# Patient Record
Sex: Male | Born: 1970 | Hispanic: Yes | Marital: Married | Smoking: Former smoker
Health system: Southern US, Community
[De-identification: ages and names within clinical notes are randomized; demographics above are authoritative.]

## PROBLEM LIST (undated history)

## (undated) DIAGNOSIS — R011 Cardiac murmur, unspecified: Secondary | ICD-10-CM

## (undated) DIAGNOSIS — I2510 Atherosclerotic heart disease of native coronary artery without angina pectoris: Secondary | ICD-10-CM

## (undated) DIAGNOSIS — I10 Essential (primary) hypertension: Secondary | ICD-10-CM

## (undated) DIAGNOSIS — E785 Hyperlipidemia, unspecified: Secondary | ICD-10-CM

## (undated) DIAGNOSIS — E119 Type 2 diabetes mellitus without complications: Secondary | ICD-10-CM

---

## 2011-10-30 HISTORY — PX: CORONARY ANGIOPLASTY WITH STENT PLACEMENT: SHX49

## 2015-02-11 ENCOUNTER — Encounter (HOSPITAL_COMMUNITY): Payer: Self-pay | Admitting: *Deleted

## 2015-02-11 ENCOUNTER — Emergency Department (HOSPITAL_COMMUNITY): Payer: Medicaid Other

## 2015-02-11 ENCOUNTER — Inpatient Hospital Stay (HOSPITAL_COMMUNITY)
Admission: EM | Admit: 2015-02-11 | Discharge: 2015-02-20 | DRG: 234 | Disposition: A | Discharge status: Home / Self Care | Payer: Medicaid Other | Attending: Thoracic Surgery (Cardiothoracic Vascular Surgery) | Admitting: Thoracic Surgery (Cardiothoracic Vascular Surgery)

## 2015-02-11 DIAGNOSIS — I25111 Atherosclerotic heart disease of native coronary artery with angina pectoris with documented spasm: Secondary | ICD-10-CM

## 2015-02-11 DIAGNOSIS — E785 Hyperlipidemia, unspecified: Secondary | ICD-10-CM | POA: Diagnosis present

## 2015-02-11 DIAGNOSIS — Z8249 Family history of ischemic heart disease and other diseases of the circulatory system: Secondary | ICD-10-CM

## 2015-02-11 DIAGNOSIS — R001 Bradycardia, unspecified: Secondary | ICD-10-CM | POA: Diagnosis present

## 2015-02-11 DIAGNOSIS — I252 Old myocardial infarction: Secondary | ICD-10-CM

## 2015-02-11 DIAGNOSIS — Z7982 Long term (current) use of aspirin: Secondary | ICD-10-CM

## 2015-02-11 DIAGNOSIS — E119 Type 2 diabetes mellitus without complications: Secondary | ICD-10-CM | POA: Diagnosis present

## 2015-02-11 DIAGNOSIS — Z951 Presence of aortocoronary bypass graft: Secondary | ICD-10-CM

## 2015-02-11 DIAGNOSIS — F17210 Nicotine dependence, cigarettes, uncomplicated: Secondary | ICD-10-CM | POA: Diagnosis present

## 2015-02-11 DIAGNOSIS — I2582 Chronic total occlusion of coronary artery: Secondary | ICD-10-CM | POA: Diagnosis present

## 2015-02-11 DIAGNOSIS — D696 Thrombocytopenia, unspecified: Secondary | ICD-10-CM | POA: Diagnosis not present

## 2015-02-11 DIAGNOSIS — I214 Non-ST elevation (NSTEMI) myocardial infarction: Secondary | ICD-10-CM

## 2015-02-11 DIAGNOSIS — Z955 Presence of coronary angioplasty implant and graft: Secondary | ICD-10-CM

## 2015-02-11 DIAGNOSIS — I10 Essential (primary) hypertension: Secondary | ICD-10-CM | POA: Diagnosis present

## 2015-02-11 DIAGNOSIS — Z419 Encounter for procedure for purposes other than remedying health state, unspecified: Secondary | ICD-10-CM

## 2015-02-11 DIAGNOSIS — I340 Nonrheumatic mitral (valve) insufficiency: Secondary | ICD-10-CM | POA: Diagnosis present

## 2015-02-11 DIAGNOSIS — I25110 Atherosclerotic heart disease of native coronary artery with unstable angina pectoris: Secondary | ICD-10-CM | POA: Diagnosis present

## 2015-02-11 DIAGNOSIS — J9811 Atelectasis: Secondary | ICD-10-CM | POA: Diagnosis not present

## 2015-02-11 HISTORY — DX: Hyperlipidemia, unspecified: E78.5

## 2015-02-11 HISTORY — DX: Atherosclerotic heart disease of native coronary artery without angina pectoris: I25.10

## 2015-02-11 HISTORY — DX: Type 2 diabetes mellitus without complications: E11.9

## 2015-02-11 HISTORY — DX: Essential (primary) hypertension: I10

## 2015-02-11 LAB — CBC WITH DIFFERENTIAL/PLATELET
Basophils Absolute: 0 10*3/uL (ref 0.0–0.1)
Basophils Relative: 0 % (ref 0–1)
Eosinophils Absolute: 0.2 10*3/uL (ref 0.0–0.7)
Eosinophils Relative: 2 % (ref 0–5)
HCT: 44.2 % (ref 39.0–52.0)
Hemoglobin: 15.2 g/dL (ref 13.0–17.0)
LYMPHS PCT: 47 % — AB (ref 12–46)
Lymphs Abs: 5 10*3/uL — ABNORMAL HIGH (ref 0.7–4.0)
MCH: 31 pg (ref 26.0–34.0)
MCHC: 34.4 g/dL (ref 30.0–36.0)
MCV: 90 fL (ref 78.0–100.0)
MONO ABS: 0.7 10*3/uL (ref 0.1–1.0)
Monocytes Relative: 7 % (ref 3–12)
NEUTROS ABS: 4.5 10*3/uL (ref 1.7–7.7)
Neutrophils Relative %: 44 % (ref 43–77)
Platelets: 192 10*3/uL (ref 150–400)
RBC: 4.91 MIL/uL (ref 4.22–5.81)
RDW: 13.1 % (ref 11.5–15.5)
WBC: 10.4 10*3/uL (ref 4.0–10.5)

## 2015-02-11 LAB — BASIC METABOLIC PANEL
ANION GAP: 10 (ref 5–15)
BUN: 8 mg/dL (ref 6–23)
CHLORIDE: 104 mmol/L (ref 96–112)
CO2: 22 mmol/L (ref 19–32)
Calcium: 9.6 mg/dL (ref 8.4–10.5)
Creatinine, Ser: 1 mg/dL (ref 0.50–1.35)
GFR calc Af Amer: >90 mL/min (ref >90)
GFR calc non Af Amer: 89 mL/min — ABNORMAL LOW (ref >90)
Glucose, Bld: 305 mg/dL — ABNORMAL HIGH (ref 70–99)
POTASSIUM: 3.7 mmol/L (ref 3.5–5.1)
SODIUM: 136 mmol/L (ref 135–145)

## 2015-02-11 LAB — APTT: aPTT: 29 seconds (ref 24–37)

## 2015-02-11 LAB — TROPONIN I: TROPONIN I: 0.35 ng/mL — AB (ref <0.031)

## 2015-02-11 MED ORDER — HEPARIN BOLUS VIA INFUSION
4000.0000 [IU] | Freq: Once | INTRAVENOUS | Status: AC
  Administered 2015-02-12: 4000 [IU] via INTRAVENOUS
  Filled 2015-02-11: qty 4000

## 2015-02-11 MED ORDER — HEPARIN (PORCINE) IN NACL 100-0.45 UNIT/ML-% IJ SOLN
1100.0000 [IU]/h | INTRAMUSCULAR | Status: DC
  Administered 2015-02-12: 1100 [IU]/h via INTRAVENOUS
  Filled 2015-02-11 (×3): qty 250

## 2015-02-11 MED ORDER — NITROGLYCERIN 0.4 MG SL SUBL
0.4000 mg | SUBLINGUAL_TABLET | SUBLINGUAL | Status: AC | PRN
  Administered 2015-02-11 (×3): 0.4 mg via SUBLINGUAL
  Filled 2015-02-11: qty 1

## 2015-02-11 MED ORDER — ASPIRIN 81 MG PO CHEW
324.0000 mg | CHEWABLE_TABLET | Freq: Once | ORAL | Status: AC
  Administered 2015-02-11: 324 mg via ORAL
  Filled 2015-02-11: qty 4

## 2015-02-11 NOTE — ED Notes (Signed)
Pt went from triage to Xray.

## 2015-02-11 NOTE — ED Notes (Signed)
MD at bedside. Dr. Bebe Shaggy at bedside with use of interpreter

## 2015-02-11 NOTE — ED Notes (Signed)
Pt c/o  Mid-chest pain since last pm no sob no vomiting no cold.  Language barrier

## 2015-02-11 NOTE — ED Provider Notes (Signed)
CSN: [id number]     Arrival date & time 02/11/15  2049 History   First MD Initiated Contact with Patient 02/11/15 2126     Chief Complaint  Patient presents with  . Chest Pain      Patient is a 44 y.o. male presenting with chest pain. The history is provided by the patient. A language interpreter was used ([id number]).  Chest Pain Pain location:  Substernal area Pain quality: pressure   Pain radiates to:  Does not radiate Pain severity:  Moderate Onset quality:  Gradual Duration:  1 day Timing:  Intermittent Progression:  Improving Chronicity:  Recurrent Relieved by:  Aspirin and nitroglycerin Worsened by:  Exertion Associated symptoms: shortness of breath   Associated symptoms: no fever, no lower extremity edema, no syncope and not vomiting   Risk factors: coronary artery disease, male sex and smoking   Pt with h/o CAD He moved here recently 3 yrs ago he was diagnosed with CAD in [place] He reports this episode of CP has been intermittent since last night He reports he has had similar episodes of CP on/off for past 3 yrs He reports usually CP improves with NTG but he is still having pain at this time   PMH - CAD Soc hx - smoker, just moved here last month  History  Substance Use Topics  . Smoking status: Never Smoker   . Smokeless tobacco: Not on file  . Alcohol Use: No    Review of Systems  Constitutional: Negative for fever.  Respiratory: Positive for shortness of breath.   Cardiovascular: Positive for chest pain. Negative for leg swelling and syncope.  Gastrointestinal: Negative for vomiting.  Neurological: Negative for syncope.  All other systems reviewed and are negative.     Allergies  Review of patient's allergies indicates no known allergies.  Home Medications   Prior to Admission medications   Medication Sig Start Date End Date Taking? Authorizing Provider  aspirin 325 MG tablet Take 325 mg by mouth daily.   Yes Historical Provider, MD  isosorbide  dinitrate (ISORDIL) 30 MG tablet Take 30 mg by mouth 4 (four) times daily.   Yes Historical Provider, MD  lisinopril (PRINIVIL,ZESTRIL) 5 MG tablet Take 5 mg by mouth daily.   Yes Historical Provider, MD  metFORMIN (GLUCOPHAGE) 500 MG tablet Take 500 mg by mouth 2 (two) times daily with a meal.   Yes Historical Provider, MD  nitroGLYCERIN (NITROSTAT) 0.4 MG SL tablet Place 0.4 mg under the tongue every 5 (five) minutes as needed for chest pain.   Yes Historical Provider, MD   BP 120/69 mmHg  Pulse 66  Temp(Src) 98.2 F (36.8 C) (Oral)  Resp 18  SpO2 100% Physical Exam CONSTITUTIONAL: Well developed/well nourished HEAD: Normocephalic/atraumatic EYES: EOMI ENMT: Mucous membranes moist NECK: supple no meningeal signs SPINE/BACK:entire spine nontender CV: S1/S2 noted, no murmurs/rubs/gallops noted LUNGS: Lungs are clear to auscultation bilaterally, no apparent distress ABDOMEN: soft, nontender, no rebound or guarding, bowel sounds noted throughout abdomen NEURO: Pt is awake/alert/appropriate, moves all extremitiesx4.  No facial droop.   EXTREMITIES: pulses normal/equal, full ROM SKIN: warm, color normal PSYCH: no abnormalities of mood noted, alert and oriented to situation  ED Course  Procedures  CRITICAL CARE Performed by: Joya Gaskins Total critical care time: 31 Critical care time was exclusive of separately billable procedures and treating other patients. Critical care was necessary to treat or prevent imminent or life-threatening deterioration. Critical care was time spent personally by me on the following activities:  development of treatment plan with patient and/or surrogate as well as nursing, discussions with consultants, evaluation of patient's response to treatment, examination of patient, obtaining history from patient or surrogate, ordering and performing treatments and interventions, ordering and review of laboratory studies, ordering and review of radiographic  studies, pulse oximetry and re-evaluation of patient's condition. PATIENT WITH NON-STEMI.  HE HAS BEEN GIVEN ASA/NTG AND HEPARIN HAS BEEN ORDERED.  CARDIOLOGY HAS BEEN CONSULTED FOR ADMISSION  10:05 PM Pt with h/o CAD He actually has images from cath report in 2013 from [place] with him tonight Will give ASA/NTG at this time 10:29 PM Pt is pain free at this time Troponin elevated D/w cardiology (fellow Isidoro Donning) will evaluate patient 11:50 PM Pt reports return of chest pain Advised nursing to give another NTG Heparin has been ordered BP 157/85 mmHg  Pulse 84  Temp(Src) 98.2 F (36.8 C) (Oral)  Resp 16  SpO2 97%  Labs Review Labs Reviewed  BASIC METABOLIC PANEL - Abnormal; Notable for the following:    Glucose, Bld 305 (*)    GFR calc non Af Amer 89 (*)    All other components within normal limits  CBC WITH DIFFERENTIAL/PLATELET - Abnormal; Notable for the following:    Lymphocytes Relative 47 (*)    Lymphs Abs 5.0 (*)    All other components within normal limits  TROPONIN I - Abnormal; Notable for the following:    Troponin I 0.35 (*)    All other components within normal limits  APTT  HEPARIN LEVEL (UNFRACTIONATED)  CBC    Imaging Review Dg Chest 2 View  02/11/2015   CLINICAL DATA:  Bilateral chest pain and shortness of breath, 2 days duration.  EXAM: CHEST  2 VIEW  COMPARISON:  None.  FINDINGS: Heart size is normal. Mediastinal shadows are normal. Lungs are clear. No effusions. No bony abnormality.  IMPRESSION: Normal chest   Electronically Signed   By: Paulina Fusi M.D.   On: 02/11/2015 21:59    ED ECG REPORT  Date: 02/11/2015 2057  Rate: 75  Rhythm: normal sinus rhythm  QRS Axis: normal  Intervals: normal  ST/T Wave abnormalities: ST depressions inferiorly  Conduction Disutrbances:none  Narrative Interpretation: evidence of previous anterior infarct noted  Old EKG Reviewed: none available     EKG Interpretation   Date/Time:  Friday February 11 2015 21:51:34  EDT Ventricular Rate:  70 PR Interval:  131 QRS Duration: 110 QT Interval:  391 QTC Calculation: 422 R Axis:   67 Text Interpretation:  Sinus rhythm Anterolateral infarct, age  indeterminate No significant change since last tracing Confirmed by  Bebe Shaggy  MD, Rolla Kedzierski ([id number]) on 02/11/2015 10:02:06 PM     Medications  heparin bolus via infusion 4,000 Units (not administered)  heparin ADULT infusion 100 units/mL (25000 units/250 mL) (not administered)  aspirin chewable tablet 324 mg (324 mg Oral Given 02/11/15 2200)  nitroGLYCERIN (NITROSTAT) SL tablet 0.4 mg (0.4 mg Sublingual Given 02/11/15 2349)    MDM   Final diagnoses:  Non-STEMI (non-ST elevated myocardial infarction)    Nursing notes including past medical history and social history reviewed and considered in documentation xrays/imaging reviewed by myself and considered during evaluation Labs/vital reviewed myself and considered during evaluation     Zadie Rhine, MD 02/11/15 2351

## 2015-02-11 NOTE — Progress Notes (Signed)
ANTICOAGULATION CONSULT NOTE - Initial Consult  Pharmacy Consult for Heparin  Indication: chest pain/ACS  No Known Allergies  Vital Signs: Temp: 98.2 F (36.8 C) (04/15 2201) Temp Source: Oral (04/15 2201) BP: 157/85 mmHg (04/15 2317) Pulse Rate: 84 (04/15 2317)  Labs:  Recent Labs  02/11/15 2106  HGB 15.2  HCT 44.2  PLT 192  CREATININE 1.00  TROPONINI 0.35*    Assessment: Heparin for CP, mildly elevated troponin, CBC good, renal function good, other labs as above.   Goal of Therapy:  Heparin level 0.3-0.7 units/ml Monitor platelets by anticoagulation protocol: Yes   Plan:  -Heparin 4000 units BOLUS -Start heparin drip at 1100 units/hr -0800 HL -Daily CBC/HL -Monitor for bleeding  Abran Duke 02/11/2015,11:36 PM

## 2015-02-12 ENCOUNTER — Encounter (HOSPITAL_COMMUNITY): Payer: Self-pay | Admitting: Cardiology

## 2015-02-12 DIAGNOSIS — E785 Hyperlipidemia, unspecified: Secondary | ICD-10-CM | POA: Diagnosis present

## 2015-02-12 DIAGNOSIS — D696 Thrombocytopenia, unspecified: Secondary | ICD-10-CM | POA: Diagnosis not present

## 2015-02-12 DIAGNOSIS — J9811 Atelectasis: Secondary | ICD-10-CM | POA: Diagnosis not present

## 2015-02-12 DIAGNOSIS — I214 Non-ST elevation (NSTEMI) myocardial infarction: Principal | ICD-10-CM

## 2015-02-12 DIAGNOSIS — I10 Essential (primary) hypertension: Secondary | ICD-10-CM | POA: Diagnosis present

## 2015-02-12 DIAGNOSIS — Z8249 Family history of ischemic heart disease and other diseases of the circulatory system: Secondary | ICD-10-CM | POA: Diagnosis not present

## 2015-02-12 DIAGNOSIS — Z955 Presence of coronary angioplasty implant and graft: Secondary | ICD-10-CM | POA: Diagnosis not present

## 2015-02-12 DIAGNOSIS — F17210 Nicotine dependence, cigarettes, uncomplicated: Secondary | ICD-10-CM | POA: Diagnosis present

## 2015-02-12 DIAGNOSIS — R001 Bradycardia, unspecified: Secondary | ICD-10-CM | POA: Diagnosis present

## 2015-02-12 DIAGNOSIS — I25110 Atherosclerotic heart disease of native coronary artery with unstable angina pectoris: Secondary | ICD-10-CM | POA: Diagnosis present

## 2015-02-12 DIAGNOSIS — E1159 Type 2 diabetes mellitus with other circulatory complications: Secondary | ICD-10-CM

## 2015-02-12 DIAGNOSIS — E119 Type 2 diabetes mellitus without complications: Secondary | ICD-10-CM | POA: Diagnosis present

## 2015-02-12 DIAGNOSIS — I340 Nonrheumatic mitral (valve) insufficiency: Secondary | ICD-10-CM | POA: Diagnosis present

## 2015-02-12 DIAGNOSIS — R0789 Other chest pain: Secondary | ICD-10-CM | POA: Diagnosis present

## 2015-02-12 DIAGNOSIS — Z7982 Long term (current) use of aspirin: Secondary | ICD-10-CM | POA: Diagnosis not present

## 2015-02-12 DIAGNOSIS — I2582 Chronic total occlusion of coronary artery: Secondary | ICD-10-CM | POA: Diagnosis present

## 2015-02-12 DIAGNOSIS — I252 Old myocardial infarction: Secondary | ICD-10-CM | POA: Diagnosis not present

## 2015-02-12 LAB — BASIC METABOLIC PANEL
Anion gap: 8 (ref 5–15)
BUN: 7 mg/dL (ref 6–23)
CALCIUM: 9.1 mg/dL (ref 8.4–10.5)
CHLORIDE: 105 mmol/L (ref 96–112)
CO2: 25 mmol/L (ref 19–32)
Creatinine, Ser: 1.02 mg/dL (ref 0.50–1.35)
GFR calc non Af Amer: 87 mL/min — ABNORMAL LOW (ref >90)
GLUCOSE: 220 mg/dL — AB (ref 70–99)
Potassium: 4.1 mmol/L (ref 3.5–5.1)
Sodium: 138 mmol/L (ref 135–145)

## 2015-02-12 LAB — CBC
HEMATOCRIT: 42.7 % (ref 39.0–52.0)
Hemoglobin: 14.8 g/dL (ref 13.0–17.0)
MCH: 31.2 pg (ref 26.0–34.0)
MCHC: 34.7 g/dL (ref 30.0–36.0)
MCV: 90.1 fL (ref 78.0–100.0)
PLATELETS: 186 10*3/uL (ref 150–400)
RBC: 4.74 MIL/uL (ref 4.22–5.81)
RDW: 13.2 % (ref 11.5–15.5)
WBC: 9.7 10*3/uL (ref 4.0–10.5)

## 2015-02-12 LAB — PROTIME-INR
INR: 1.07 (ref 0.00–1.49)
Prothrombin Time: 14 seconds (ref 11.6–15.2)

## 2015-02-12 LAB — LIPID PANEL
CHOL/HDL RATIO: 6.1 ratio
Cholesterol: 158 mg/dL (ref 0–200)
HDL: 26 mg/dL — ABNORMAL LOW (ref >39)
LDL CALC: 98 mg/dL (ref 0–99)
TRIGLYCERIDES: 172 mg/dL — AB (ref <150)
VLDL: 34 mg/dL (ref 0–40)

## 2015-02-12 LAB — GLUCOSE, CAPILLARY
GLUCOSE-CAPILLARY: 240 mg/dL — AB (ref 70–99)
GLUCOSE-CAPILLARY: 194 mg/dL — AB (ref 70–99)
Glucose-Capillary: 243 mg/dL — ABNORMAL HIGH (ref 70–99)
Glucose-Capillary: 220 mg/dL — ABNORMAL HIGH (ref 70–99)

## 2015-02-12 LAB — TROPONIN I
TROPONIN I: 0.6 ng/mL — AB (ref <0.031)
Troponin I: 0.49 ng/mL — ABNORMAL HIGH (ref <0.031)
Troponin I: 0.65 ng/mL (ref <0.031)

## 2015-02-12 LAB — HEPARIN LEVEL (UNFRACTIONATED)
Heparin Unfractionated: 0.43 IU/mL (ref 0.30–0.70)
Heparin Unfractionated: 0.35 IU/mL (ref 0.30–0.70)

## 2015-02-12 LAB — TSH: TSH: 1.956 u[IU]/mL (ref 0.350–4.500)

## 2015-02-12 MED ORDER — ASPIRIN EC 81 MG PO TBEC
81.0000 mg | DELAYED_RELEASE_TABLET | Freq: Every day | ORAL | Status: DC
  Administered 2015-02-12 – 2015-02-15 (×3): 81 mg via ORAL
  Filled 2015-02-12 (×5): qty 1

## 2015-02-12 MED ORDER — NITROGLYCERIN 0.4 MG SL SUBL
0.4000 mg | SUBLINGUAL_TABLET | SUBLINGUAL | Status: DC | PRN
  Administered 2015-02-12 – 2015-02-13 (×4): 0.4 mg via SUBLINGUAL
  Filled 2015-02-12 (×2): qty 1

## 2015-02-12 MED ORDER — ZOLPIDEM TARTRATE 5 MG PO TABS: 5.0000 mg | ORAL_TABLET | Freq: Every evening | ORAL | Status: DC | PRN

## 2015-02-12 MED ORDER — SODIUM CHLORIDE 0.9 % IV SOLN
INTRAVENOUS | Status: DC
Start: 2015-02-14 — End: 2015-02-14
  Administered 2015-02-12: 75 mL/h via INTRAVENOUS
  Administered 2015-02-14 (×2): via INTRAVENOUS

## 2015-02-12 MED ORDER — ISOSORBIDE MONONITRATE ER 60 MG PO TB24
60.0000 mg | ORAL_TABLET | Freq: Every day | ORAL | Status: DC
  Administered 2015-02-12 – 2015-02-14 (×3): 60 mg via ORAL
  Filled 2015-02-12 (×5): qty 1

## 2015-02-12 MED ORDER — INSULIN DETEMIR 100 UNIT/ML ~~LOC~~ SOLN
10.0000 [IU] | Freq: Every day | SUBCUTANEOUS | Status: DC
  Administered 2015-02-12 – 2015-02-14 (×4): 10 [IU] via SUBCUTANEOUS
  Filled 2015-02-12 (×6): qty 0.1

## 2015-02-12 MED ORDER — ONDANSETRON HCL 4 MG/2ML IJ SOLN: 4.0000 mg | Freq: Four times a day (QID) | INTRAMUSCULAR | Status: DC | PRN

## 2015-02-12 MED ORDER — LISINOPRIL 5 MG PO TABS
5.0000 mg | ORAL_TABLET | Freq: Every day | ORAL | Status: DC
  Administered 2015-02-12 – 2015-02-14 (×3): 5 mg via ORAL
  Filled 2015-02-12 (×3): qty 1

## 2015-02-12 MED ORDER — INSULIN ASPART 100 UNIT/ML ~~LOC~~ SOLN
0.0000 [IU] | Freq: Three times a day (TID) | SUBCUTANEOUS | Status: DC
  Administered 2015-02-12 – 2015-02-13 (×4): 5 [IU] via SUBCUTANEOUS
  Administered 2015-02-13: 15 [IU] via SUBCUTANEOUS
  Administered 2015-02-14: 5 [IU] via SUBCUTANEOUS
  Administered 2015-02-14: 3 [IU] via SUBCUTANEOUS
  Administered 2015-02-15: 2 [IU] via SUBCUTANEOUS
  Administered 2015-02-15 – 2015-02-16 (×3): 3 [IU] via SUBCUTANEOUS

## 2015-02-12 MED ORDER — INSULIN ASPART 100 UNIT/ML ~~LOC~~ SOLN
0.0000 [IU] | Freq: Every day | SUBCUTANEOUS | Status: DC
  Administered 2015-02-12: 2 [IU] via SUBCUTANEOUS
  Administered 2015-02-13: 3 [IU] via SUBCUTANEOUS

## 2015-02-12 MED ORDER — METOPROLOL TARTRATE 25 MG PO TABS
25.0000 mg | ORAL_TABLET | Freq: Two times a day (BID) | ORAL | Status: DC
  Administered 2015-02-12 (×3): 25 mg via ORAL
  Filled 2015-02-12 (×5): qty 1

## 2015-02-12 MED ORDER — ATORVASTATIN CALCIUM 80 MG PO TABS
80.0000 mg | ORAL_TABLET | Freq: Every day | ORAL | Status: DC
  Administered 2015-02-13 – 2015-02-19 (×6): 80 mg via ORAL
  Filled 2015-02-12 (×10): qty 1

## 2015-02-12 MED ORDER — ACETAMINOPHEN 325 MG PO TABS
650.0000 mg | ORAL_TABLET | ORAL | Status: DC | PRN
  Administered 2015-02-12: 650 mg via ORAL
  Filled 2015-02-12 (×2): qty 2

## 2015-02-12 MED ORDER — ASPIRIN 81 MG PO CHEW: 324.0000 mg | CHEWABLE_TABLET | ORAL | Status: DC

## 2015-02-12 MED ORDER — NITROGLYCERIN 0.4 MG SL SUBL
SUBLINGUAL_TABLET | SUBLINGUAL | Status: AC
  Administered 2015-02-12: 0.4 mg via SUBLINGUAL
  Filled 2015-02-12: qty 1

## 2015-02-12 MED ORDER — ASPIRIN 300 MG RE SUPP: 300.0000 mg | RECTAL | Status: DC

## 2015-02-12 NOTE — H&P (Signed)
PCP:   No primary care provider on file.   Chief Complaint:  Chest pain  HPI: This is 44 year old Hispanic male with past medical history of hypertension, hyperlipidemia, diabetes mellitus type 2, coronary artery disease status post myocardial infarction, previous heart catheterization and at least one stent placed in the LAD for what looked like LAD occlusion. History is limited secondary to language barrier however patient brought images from left heart catheterization RAO cranial views where it appears that he had LAD occluded and then stented Apparently patient his wife are visiting from [place]. Patient presented to the ED with pressure-like chest pain for about 24 hours with minimal exertion like walking across the hallway, pain resolves with nitroglycerin and doesn't go away if he does not take nitroglycerin. Chest pain is associated with shortness of breath. Patient initial troponin was positive in the ED therefore admission to cardiology was requested  Patient denied active symptoms of chest pain, shortness of breath, PND orthopnea, lower extremity edema, frequent or prolonged palpitations, lower extremity edema, nausea vomiting - at the moment of evaluation  Review of Systems:  12 systems were reviewed and were negative except mentioned in history of present illness  Past Medical History: Hypertension, hyperlipidemia, diabetes mellitus type 2, coronary artery disease status post myocardial infarction in 2012 and 2014, LAD stent  Medications: Prior to Admission medications   Medication Sig Start Date End Date Taking? Authorizing Provider  aspirin 325 MG tablet Take 325 mg by mouth daily.   Yes Historical Provider, MD  isosorbide dinitrate (ISORDIL) 30 MG tablet Take 30 mg by mouth 4 (four) times daily.   Yes Historical Provider, MD  lisinopril (PRINIVIL,ZESTRIL) 5 MG tablet Take 5 mg by mouth daily.   Yes Historical Provider, MD  metFORMIN (GLUCOPHAGE) 500 MG tablet Take 500 mg by  mouth 2 (two) times daily with a meal.   Yes Historical Provider, MD  nitroGLYCERIN (NITROSTAT) 0.4 MG SL tablet Place 0.4 mg under the tongue every 5 (five) minutes as needed for chest pain.   Yes Historical Provider, MD    Allergies:  No Known Allergies  Social History: 1 pack a day smoker for about 30 years, no drugs, no alcohol  Family History: Father - CABG at the age of 63  PHYSICAL EXAM:  Filed Vitals:   02/11/15 2317 02/11/15 2330 02/12/15 0000 02/12/15 0015  BP: 157/85 146/80 149/74 153/78  Pulse: 84 62 62 67  Temp:      TempSrc:      Resp: SpO2: 97% 97% 91% 94%   General:  Well appearing. No respiratory difficulty HEENT: normal Neck: supple. no JVD. Carotids 2+ bilat; no bruits. No lymphadenopathy or thryomegaly appreciated. Cor: PMI nondisplaced. Regular rate & rhythm. No rubs, gallops or murmurs. Lungs: clear Abdomen: soft, nontender, nondistended. No hepatosplenomegaly. No bruits or masses. Good bowel sounds. Extremities: no cyanosis, clubbing, rash, edema Neuro: alert & oriented x 3, cranial nerves grossly intact. moves all 4 extremities w/o difficulty. Affect pleasant.  Labs on Admission:   Recent Labs  02/11/15 2106  NA 136  K 3.7  CL 104  CO2 22  GLUCOSE 305*  BUN 8  CREATININE 1.00  CALCIUM 9.6   No results for input(s): AST, ALT, ALKPHOS, BILITOT, PROT, ALBUMIN in the last 72 hours. No results for input(s): LIPASE, AMYLASE in the last 72 hours.  Recent Labs  02/11/15 2106  WBC 10.4  NEUTROABS 4.5  HGB 15.2  HCT 44.2  MCV  90.0  PLT 192    Recent Labs  02/11/15 2106  TROPONINI 0.35*   No results for input(s): TSH, T4TOTAL, T3FREE, THYROIDAB in the last 72 hours.  Invalid input(s): FREET3 No results for input(s): VITAMINB12, FOLATE, FERRITIN, TIBC, IRON, RETICCTPCT in the last 72 hours.  Radiological Exams on Admission (all images were personally reviewed and interpreted by me, radiology reports were reviewed as  well): Dg Chest 2 View  02/11/2015   CLINICAL DATA:  Bilateral chest pain and shortness of breath, 2 days duration.  EXAM: CHEST  2 VIEW  COMPARISON:  None.  FINDINGS: Heart size is normal. Mediastinal shadows are normal. Lungs are clear. No effusions. No bony abnormality.  IMPRESSION: Normal chest   Electronically Signed   By: Paulina Fusi M.D.   On: 02/11/2015 21:59   EKG personally reviewed and interpreted by me: Normal sinus rhythm 75 bpm, normal axis to assess complex in V2 V3, IVCD  Assessment/Plan Present on Admission:  . NSTEMI (non-ST elevated myocardial infarction) Diabetes mellitus type 2 likely uncontrolled HLD HTN Tobacco abuse   Plan: Admit to telemetry Lipitor 80 mg daily Heparin drip per pharmacy for ACS protocol Aspirin 81 mg daily. Dose aspirin was given in the emergency department Smoking cessation counseling per nursing Imdur 60 mg daily first dose now Metoprolol 25 mg twice a day first dose now Continue lisinopril 5 mg daily Troponin every 6 hours 3 EKG as needed for chest pain Check TSH, check hemoglobin A1c, lipid profile AM labs He she will likely need repeat heart catheterization, high possibility of multivessel disease   Isaac Ray 02/12/2015, 12:48 AM

## 2015-02-12 NOTE — Progress Notes (Signed)
Utilization Review Completed.Isaac Ray T4/16/2016  

## 2015-02-12 NOTE — ED Notes (Signed)
Attempted report 

## 2015-02-12 NOTE — Progress Notes (Signed)
Spoke with Verlon Au, NP.  She was given troponin results of 0.35 and 0.60.  No orders at this time.  She will see patient later today.

## 2015-02-12 NOTE — ED Notes (Signed)
Cardiology at bedside.

## 2015-02-12 NOTE — Progress Notes (Addendum)
    Admitting cardiologist: Dr. Dina Rich (patient lives in [place])  Seen for followup: Unstable angina  Subjective:    Slept well, no recurrent chest pain or shortness of breath.  Objective:   Temp:  [97.9 F (36.6 C)-98.2 F (36.8 C)] 98 F (36.7 C) (04/16 0615) Pulse Rate:  [57-84] 57 (04/16 0615) Resp:  [14-21] 18 (04/16 0615) BP: (116-161)/(69-91) 116/70 mmHg (04/16 0615) SpO2:  [90 %-100 %] 97 % (04/16 0615) Weight:  [159 lb 4.8 oz (72.258 kg)] 159 lb 4.8 oz (72.258 kg) (04/16 0149) Last BM Date: 02/10/15  Filed Weights   02/12/15 0149  Weight: 159 lb 4.8 oz (72.258 kg)    Telemetry: Sinus rhythm and sinus bradycardia.  Exam:  General: Appears comfortable at rest.  Lungs: Clear, nonlabored.  Cardiac: RRR, no gallop or rub.  Abdomen: NABS.  Extremities: No pitting edema.  Lab Results:  Basic Metabolic Panel:  Recent Labs Lab 02/11/15 2106 02/12/15 0347  NA 136 138  K 3.7 4.1  CL 104 105  CO2 22 25  GLUCOSE 305* 220*  BUN 8 7  CREATININE 1.00 1.02  CALCIUM 9.6 9.1    CBC:  Recent Labs Lab 02/11/15 2106 02/12/15 0347  WBC 10.4 9.7  HGB 15.2 14.8  HCT 44.2 42.7  MCV 90.0 90.1  PLT 192 186    Cardiac Enzymes:  Recent Labs Lab 02/11/15 2106 02/12/15 0347 02/12/15 0753  TROPONINI 0.35* 0.49* 0.60*    Coagulation:  Recent Labs Lab 02/12/15 0347  INR 1.07    ECG: Sinus rhythm with IVCD and possible old anterolateral infarct pattern with repolarization abnormalities.   Medications:   Scheduled Medications: . aspirin EC  81 mg Oral Daily  . atorvastatin  80 mg Oral q1800  . insulin aspart  0-15 Units Subcutaneous TID WC  . insulin aspart  0-5 Units Subcutaneous QHS  . insulin detemir  10 Units Subcutaneous QHS  . isosorbide mononitrate  60 mg Oral Daily  . lisinopril  5 mg Oral Daily  . metoprolol tartrate  25 mg Oral BID     Infusions: . heparin 1,100 Units/hr (02/12/15 0017)     PRN  Medications:  acetaminophen, nitroGLYCERIN, ondansetron (ZOFRAN) IV, zolpidem   Assessment:   1. Presentation with unstable angina symptoms, only mild increase in troponin I above clinical scenario consistent with NSTEMI.  2. History of CAD status post mid LAD stent placement at [facility] in June 2013. Details of stent type not available. Review of limited records finds that the patient was treated with aspirin and Effient at the time.  3. Type 2 diabetes mellitus, on insulin sliding scale. On Glucophage as an outpatient.  4. History of hyperlipidemia, currently on high-dose Lipitor. LDL 98.  5. Essential hypertension.  Plan/Discussion:    Reviewed history and physical, patient here visiting from [place]. He is clinically stable at this time. Plan is to continue heparin with plan to pursue diagnostic cardiac catheterization on Monday. Continue remaining cardiac medications, watch heart rate with bradycardia. Check hemoglobin A1c to get a better sense for outpatient control of glucose. He will need Spanish interpreter for the procedure.   Jonelle Sidle, M.D., F.A.C.C.

## 2015-02-12 NOTE — Progress Notes (Addendum)
ANTICOAGULATION CONSULT NOTE - Initial Consult  Pharmacy Consult for Heparin  Indication: chest pain/ACS  No Known Allergies  Vital Signs: Temp: 98 F (36.7 C) (04/16 0615) Temp Source: Oral (04/16 0615) BP: 116/70 mmHg (04/16 0615) Pulse Rate: 57 (04/16 0615)  Labs:  Recent Labs  02/11/15 2106 02/12/15 0347 02/12/15 0753  HGB 15.2 14.8  --   HCT 44.2 42.7  --   PLT 192 186  --   APTT 29  --   --   LABPROT  --  14.0  --   INR  --  1.07  --   HEPARINUNFRC  --   --  0.43  CREATININE 1.00 1.02  --   TROPONINI 0.35* 0.49* 0.60*    Assessment: Heparin for CP, mildly elevated troponin, CBC good, renal function good, other labs as above. Heparin level came back therapeutic. Going to repeat level to confirm.   Goal of Therapy:  Heparin level 0.3-0.7 units/ml Monitor platelets by anticoagulation protocol: Yes   Plan:   -Cont heparin at 1100 units/hr -Repeat level to confirm -Daily CBC/HL -Monitor for bleeding  Ulyses Southward, PharmD Pager: [phone] 02/12/2015 1:36 PM  Addendum:  Hanley Hays HL is therapeutic  Cont same rate  Ulyses Southward, PharmD Pager: [phone] 02/12/2015 3:10 PM

## 2015-02-12 NOTE — Progress Notes (Signed)
Received call from Lab Pt Troponin Level 0.60 (increased from previous level of 0.49), MD on call for cardiology paged. Will continue to monitor patient. Tildon Silveria, Randall An RN

## 2015-02-13 LAB — GLUCOSE, CAPILLARY
GLUCOSE-CAPILLARY: 258 mg/dL — AB (ref 70–99)
Glucose-Capillary: 236 mg/dL — ABNORMAL HIGH (ref 70–99)
Glucose-Capillary: 207 mg/dL — ABNORMAL HIGH (ref 70–99)
Glucose-Capillary: 337 mg/dL — ABNORMAL HIGH (ref 70–99)
Glucose-Capillary: 148 mg/dL — ABNORMAL HIGH (ref 70–99)

## 2015-02-13 LAB — CBC
HCT: 43.1 % (ref 39.0–52.0)
Hemoglobin: 14.6 g/dL (ref 13.0–17.0)
MCH: 30.7 pg (ref 26.0–34.0)
MCHC: 33.9 g/dL (ref 30.0–36.0)
MCV: 90.5 fL (ref 78.0–100.0)
Platelets: 175 10*3/uL (ref 150–400)
RBC: 4.76 MIL/uL (ref 4.22–5.81)
RDW: 13.1 % (ref 11.5–15.5)
WBC: 8.7 10*3/uL (ref 4.0–10.5)

## 2015-02-13 LAB — HEPARIN LEVEL (UNFRACTIONATED)
HEPARIN UNFRACTIONATED: 0.14 [IU]/mL — AB (ref 0.30–0.70)
HEPARIN UNFRACTIONATED: 0.38 [IU]/mL (ref 0.30–0.70)

## 2015-02-13 MED ORDER — METOPROLOL TARTRATE 12.5 MG HALF TABLET
12.5000 mg | ORAL_TABLET | Freq: Two times a day (BID) | ORAL | Status: DC
  Administered 2015-02-13 (×2): 12.5 mg via ORAL
  Filled 2015-02-13 (×4): qty 1

## 2015-02-13 MED ORDER — SODIUM CHLORIDE 0.9 % IV SOLN: 250.0000 mL | INTRAVENOUS | Status: DC | PRN

## 2015-02-13 MED ORDER — SODIUM CHLORIDE 0.9 % IJ SOLN
3.0000 mL | Freq: Two times a day (BID) | INTRAMUSCULAR | Status: DC
  Administered 2015-02-13 – 2015-02-14 (×2): 3 mL via INTRAVENOUS

## 2015-02-13 MED ORDER — HEPARIN (PORCINE) IN NACL 100-0.45 UNIT/ML-% IJ SOLN
1300.0000 [IU]/h | INTRAMUSCULAR | Status: DC
  Filled 2015-02-13 (×2): qty 250

## 2015-02-13 MED ORDER — SODIUM CHLORIDE 0.9 % IJ SOLN: 3.0000 mL | INTRAMUSCULAR | Status: DC | PRN

## 2015-02-13 NOTE — Progress Notes (Signed)
ANTICOAGULATION CONSULT NOTE - Follow-up  Pharmacy Consult for Heparin  Indication: chest pain/ACS  No Known Allergies  Vital Signs: Temp: 98 F (36.7 C) (04/17 1420) Temp Source: Oral (04/17 1420) BP: 118/52 mmHg (04/17 1420) Pulse Rate: 52 (04/17 1420)  Labs:  Recent Labs  02/11/15 2106 02/12/15 0347  02/12/15 0753 02/12/15 1359 02/13/15 0520 02/13/15 1016 02/13/15 1848  HGB 15.2 14.8  --   --   --  14.6  --   --   HCT 44.2 42.7  --   --   --  43.1  --   --   PLT 192 186  --   --   --  175  --   --   APTT 29  --   --   --   --   --   --   --   LABPROT  --  14.0  --   --   --   --   --   --   INR  --  1.07  --   --   --   --   --   --   HEPARINUNFRC  --   --   < > 0.43 0.35  --  0.14* 0.38  CREATININE 1.00 1.02  --   --   --   --   --   --   TROPONINI 0.35* 0.49*  --  0.60* 0.65*  --   --   --   < > = values in this interval not displayed.  Assessment: 43 yom continues on IV heparin for CP/ACS. Heparin level is now therapeutic. No bleeding noted.   Goal of Therapy:  Heparin level 0.3-0.7 units/ml Monitor platelets by anticoagulation protocol: Yes   Plan:  - Continue heparin gtt 1300 units/hr - F/u AM heparin level to confirm dosing  Lysle Pearl, PharmD, BCPS Pager # [phone] 02/13/2015 7:27 PM

## 2015-02-13 NOTE — Progress Notes (Signed)
    Admitting cardiologist: Dr. Dina Rich (patient lives in [place])  Seen for followup: Unstable angina  Subjective:    No recurrent chest pain or shortness of breath.  Objective:   Temp:  [97.6 F (36.4 C)-98.9 F (37.2 C)] 97.6 F (36.4 C) (04/17 0509) Pulse Rate:  [44-60] 44 (04/17 0509) Resp:  [18] 18 (04/17 0509) BP: (126-144)/(65-93) 126/73 mmHg (04/17 0509) SpO2:  [94 %-99 %] 99 % (04/17 0509) Last BM Date: 02/10/15  Filed Weights   02/12/15 0149  Weight: 159 lb 4.8 oz (72.258 kg)    Telemetry: Sinus rhythm and sinus bradycardia.  Exam:  General: Appears comfortable at rest.  Lungs: Clear, nonlabored.  Cardiac: RRR, no gallop or rub.  Abdomen: NABS.  Extremities: No pitting edema.  Lab Results:  Basic Metabolic Panel:  Recent Labs Lab 02/11/15 2106 02/12/15 0347  NA 136 138  K 3.7 4.1  CL 104 105  CO2 22 25  GLUCOSE 305* 220*  BUN 8 7  CREATININE 1.00 1.02  CALCIUM 9.6 9.1    CBC:  Recent Labs Lab 02/11/15 2106 02/12/15 0347 02/13/15 0520  WBC 10.4 9.7 8.7  HGB 15.2 14.8 14.6  HCT 44.2 42.7 43.1  MCV 90.0 90.1 90.5  PLT 192 186 175    Cardiac Enzymes:  Recent Labs Lab 02/12/15 0347 02/12/15 0753 02/12/15 1359  TROPONINI 0.49* 0.60* 0.65*    Coagulation:  Recent Labs Lab 02/12/15 0347  INR 1.07    ECG: Sinus bradycardia with IVCD and old anterolateral infarct pattern with repolarization abnormalities.   Medications:   Scheduled Medications: . aspirin EC  81 mg Oral Daily  . atorvastatin  80 mg Oral q1800  . insulin aspart  0-15 Units Subcutaneous TID WC  . insulin aspart  0-5 Units Subcutaneous QHS  . insulin detemir  10 Units Subcutaneous QHS  . isosorbide mononitrate  60 mg Oral Daily  . lisinopril  5 mg Oral Daily  . metoprolol tartrate  25 mg Oral BID    Infusions: . [START ON 02/14/2015] sodium chloride 75 mL/hr (02/12/15 1536)  . heparin 1,100 Units/hr (02/12/15 0017)    PRN  Medications: acetaminophen, nitroGLYCERIN, ondansetron (ZOFRAN) IV, zolpidem   Assessment:   1. Presentation with unstable angina symptoms, only mild increase in troponin I, although clinical scenario consistent with NSTEMI. No recurrent chest pain.  2. History of CAD status post mid LAD stent placement at [facility] in June 2013. Details of stent type not available. Review of limited records finds that the patient was treated with aspirin and Effient at the time.  3. Type 2 diabetes mellitus, on insulin sliding scale. On Glucophage as an outpatient.  4. History of hyperlipidemia, currently on high-dose Lipitor. LDL 98.  5. Essential hypertension.  Plan/Discussion:    Patient here visiting from [place]. He is clinically stable at this time. Plan is to continue heparin with plan to pursue diagnostic cardiac catheterization on Monday - orders written. He will need Spanish interpreter for the procedure. Decrease beta blocker dose with bradycardia.    Jonelle Sidle, M.D., F.A.C.C.

## 2015-02-13 NOTE — Progress Notes (Signed)
ANTICOAGULATION CONSULT NOTE - Initial Consult  Pharmacy Consult for Heparin  Indication: chest pain/ACS  No Known Allergies  Vital Signs: Temp: 97.6 F (36.4 C) (04/17 0509) Temp Source: Oral (04/17 0509) BP: 126/73 mmHg (04/17 0509) Pulse Rate: 44 (04/17 0509)  Labs:  Recent Labs  02/11/15 2106 02/12/15 0347 02/12/15 0753 02/12/15 1359 02/13/15 0520 02/13/15 1016  HGB 15.2 14.8  --   --  14.6  --   HCT 44.2 42.7  --   --  43.1  --   PLT 192 186  --   --  175  --   APTT 29  --   --   --   --   --   LABPROT  --  14.0  --   --   --   --   INR  --  1.07  --   --   --   --   HEPARINUNFRC  --   --  0.43 0.35  --  0.14*  CREATININE 1.00 1.02  --   --   --   --   TROPONINI 0.35* 0.49* 0.60* 0.65*  --   --     Assessment: Heparin for CP, mildly elevated troponin, CBC good, renal function good, other labs as above. Heparin level subtherapeutic this AM. Going to adjust rate. Cath in AM  Goal of Therapy:  Heparin level 0.3-0.7 units/ml Monitor platelets by anticoagulation protocol: Yes   Plan:   Increase heparin to 1300 units/hr Check 6 hr heparin level  Ulyses Southward, PharmD Pager: [phone] 02/13/2015 12:07 PM

## 2015-02-14 ENCOUNTER — Encounter (HOSPITAL_COMMUNITY)
Admission: EM | Disposition: A | Discharge status: Home / Self Care | Payer: Self-pay | Source: Home / Self Care | Attending: Cardiology

## 2015-02-14 ENCOUNTER — Other Ambulatory Visit: Payer: Self-pay

## 2015-02-14 ENCOUNTER — Encounter (HOSPITAL_COMMUNITY): Payer: Self-pay | Admitting: Thoracic Surgery (Cardiothoracic Vascular Surgery)

## 2015-02-14 DIAGNOSIS — I25111 Atherosclerotic heart disease of native coronary artery with angina pectoris with documented spasm: Secondary | ICD-10-CM

## 2015-02-14 DIAGNOSIS — I25110 Atherosclerotic heart disease of native coronary artery with unstable angina pectoris: Secondary | ICD-10-CM

## 2015-02-14 HISTORY — PX: LEFT HEART CATHETERIZATION WITH CORONARY ANGIOGRAM: SHX5451

## 2015-02-14 LAB — GLUCOSE, CAPILLARY
GLUCOSE-CAPILLARY: 144 mg/dL — AB (ref 70–99)
Glucose-Capillary: 184 mg/dL — ABNORMAL HIGH (ref 70–99)
Glucose-Capillary: 131 mg/dL — ABNORMAL HIGH (ref 70–99)
Glucose-Capillary: 223 mg/dL — ABNORMAL HIGH (ref 70–99)

## 2015-02-14 LAB — BASIC METABOLIC PANEL
Anion gap: 8 (ref 5–15)
BUN: 9 mg/dL (ref 6–23)
CO2: 25 mmol/L (ref 19–32)
CREATININE: 1.06 mg/dL (ref 0.50–1.35)
Calcium: 8.7 mg/dL (ref 8.4–10.5)
Chloride: 106 mmol/L (ref 96–112)
GFR calc non Af Amer: 83 mL/min — ABNORMAL LOW (ref >90)
Glucose, Bld: 192 mg/dL — ABNORMAL HIGH (ref 70–99)
Potassium: 3.9 mmol/L (ref 3.5–5.1)
Sodium: 139 mmol/L (ref 135–145)

## 2015-02-14 LAB — HEMOGLOBIN A1C
HEMOGLOBIN A1C: 10.1 % — AB (ref 4.8–5.6)
Hgb A1c MFr Bld: 10.2 % — ABNORMAL HIGH (ref 4.8–5.6)
Mean Plasma Glucose: 243 mg/dL
Mean Plasma Glucose: 246 mg/dL

## 2015-02-14 LAB — CBC
HCT: 41 % (ref 39.0–52.0)
HEMOGLOBIN: 13.9 g/dL (ref 13.0–17.0)
MCH: 30.9 pg (ref 26.0–34.0)
MCHC: 33.9 g/dL (ref 30.0–36.0)
MCV: 91.1 fL (ref 78.0–100.0)
Platelets: 174 10*3/uL (ref 150–400)
RBC: 4.5 MIL/uL (ref 4.22–5.81)
RDW: 13.2 % (ref 11.5–15.5)
WBC: 8.7 10*3/uL (ref 4.0–10.5)

## 2015-02-14 LAB — HEPARIN LEVEL (UNFRACTIONATED): HEPARIN UNFRACTIONATED: 0.44 [IU]/mL (ref 0.30–0.70)

## 2015-02-14 SURGERY — LEFT HEART CATHETERIZATION WITH CORONARY ANGIOGRAM
Anesthesia: LOCAL

## 2015-02-14 MED ORDER — NITROGLYCERIN 1 MG/10 ML FOR IR/CATH LAB
INTRA_ARTERIAL | Status: AC
  Filled 2015-02-14: qty 10

## 2015-02-14 MED ORDER — ACETAMINOPHEN 325 MG PO TABS: 650.0000 mg | ORAL_TABLET | ORAL | Status: DC | PRN

## 2015-02-14 MED ORDER — ALPRAZOLAM 0.25 MG PO TABS: 0.2500 mg | ORAL_TABLET | ORAL | Status: DC | PRN

## 2015-02-14 MED ORDER — TRAMADOL HCL 50 MG PO TABS
50.0000 mg | ORAL_TABLET | Freq: Four times a day (QID) | ORAL | Status: DC | PRN
  Administered 2015-02-14: 50 mg via ORAL
  Filled 2015-02-14: qty 1

## 2015-02-14 MED ORDER — ASPIRIN 81 MG PO CHEW
CHEWABLE_TABLET | ORAL | Status: AC
  Administered 2015-02-14: 81 mg
  Filled 2015-02-14: qty 1

## 2015-02-14 MED ORDER — HEPARIN (PORCINE) IN NACL 100-0.45 UNIT/ML-% IJ SOLN
1300.0000 [IU]/h | INTRAMUSCULAR | Status: DC
  Administered 2015-02-14 – 2015-02-15 (×2): 1300 [IU]/h via INTRAVENOUS
  Filled 2015-02-14 (×5): qty 250

## 2015-02-14 MED ORDER — ATORVASTATIN CALCIUM 80 MG PO TABS: 80.0000 mg | ORAL_TABLET | Freq: Every day | ORAL | Status: DC

## 2015-02-14 MED ORDER — HEPARIN SODIUM (PORCINE) 1000 UNIT/ML IJ SOLN
INTRAMUSCULAR | Status: AC
  Filled 2015-02-14: qty 1

## 2015-02-14 MED ORDER — FENTANYL CITRATE (PF) 100 MCG/2ML IJ SOLN
INTRAMUSCULAR | Status: AC
  Filled 2015-02-14: qty 2

## 2015-02-14 MED ORDER — ONDANSETRON HCL 4 MG/2ML IJ SOLN: 4.0000 mg | Freq: Four times a day (QID) | INTRAMUSCULAR | Status: DC | PRN

## 2015-02-14 MED ORDER — MIDAZOLAM HCL 2 MG/2ML IJ SOLN
INTRAMUSCULAR | Status: AC
  Filled 2015-02-14: qty 2

## 2015-02-14 MED ORDER — VERAPAMIL HCL 2.5 MG/ML IV SOLN
INTRAVENOUS | Status: AC
  Filled 2015-02-14: qty 2

## 2015-02-14 MED ORDER — MORPHINE SULFATE 2 MG/ML IJ SOLN: 2.0000 mg | INTRAMUSCULAR | Status: DC | PRN

## 2015-02-14 MED ORDER — LIDOCAINE HCL (PF) 1 % IJ SOLN
INTRAMUSCULAR | Status: AC
  Filled 2015-02-14: qty 30

## 2015-02-14 MED ORDER — SODIUM CHLORIDE 0.9 % IV SOLN
INTRAVENOUS | Status: AC
  Administered 2015-02-14: 13:00:00 via INTRAVENOUS

## 2015-02-14 MED ORDER — HEPARIN (PORCINE) IN NACL 2-0.9 UNIT/ML-% IJ SOLN
INTRAMUSCULAR | Status: AC
  Filled 2015-02-14: qty 1000

## 2015-02-14 NOTE — Progress Notes (Signed)
02/14/15  Pharmacy-  Heparin 1425   A/P:  44yo male now s/p cath and in need of CABG, to resume heparin with no bolus 3hr after TR band removal.  Per d/w RN, this will occur at 1445.  Heparin level this AM was therapeutic, so will resume at previous rate.  1-  Resume heparin 1300 units/hr at 1745 2-  Check Heparin level 8hr after resume 3-  Daily HL, CBC 4-  Watch for s/s of bleeding.  Marisue Humble, PharmD Clinical Pharmacist [facility]

## 2015-02-14 NOTE — Progress Notes (Signed)
Inpatient Diabetes Program Recommendations  AACE/ADA: New Consensus Statement on Inpatient Glycemic Control (2013)  Target Ranges:  Prepandial:   less than 140 mg/dL      Peak postprandial:   less than 180 mg/dL (1-2 hours)      Critically ill patients:  140 - 180 mg/dL   Reason for Assessment:  Results for JARRET, BRINN (MRN [id number]) as of 02/14/2015 10:22  Ref. Range 02/13/2015 06:25 02/13/2015 11:25 02/13/2015 16:09 02/13/2015 22:10 02/14/2015 06:08  Glucose-Capillary Latest Ref Range: 70-99 mg/dL 754 (H) 492 (H) 010 (H) 258 (H) 184 (H)   Diabetes history:  Type 2 diabetes Outpatient Diabetes medications:  Metformin 500 mg bid Current orders for Inpatient glycemic control:  Lantus 10 units daily, Novolog moderate tid with meals and HS  A1C pending.  Please consider increasing Lantus to 15 units q HS while in the hospital.  Thanks, Beryl Meager, RN, BC-ADM Inpatient Diabetes Coordinator Pager [phone] (8a-5p)

## 2015-02-14 NOTE — Progress Notes (Signed)
Pt arrived from the cath lab via stretcher at 1308; pt Alert and verbally responsive; pt educated on keeping right arm elevated and precautions to prevent bleeding with translator in room; pt voice understanding and denies any questions. Right radial TR band has 11cc air per report from cath lab; site level 0 with no active bleeding, hematoma or bruising. VSS, telemetry reapplied. Will continue to monitor per protocol; pt IV infusing. Pt in room with family at bedside. Arabella Merles Patrisha Hausmann RN.

## 2015-02-14 NOTE — CV Procedure (Signed)
Isaac Ray is a 44 y.o. male    [id number] LOCATION:  FACILITY: [facility]  PHYSICIAN: Nanetta Batty, M.D. July 01, 1971   DATE OF PROCEDURE:  02/14/2015  DATE OF DISCHARGE:     CARDIAC CATHETERIZATION     History obtained from chart review. Mr. Isaac Ray is a 44 year old married Latino male with a prior history of CAD status post stenting in 2013 at [facility] in [place]. He has a history of treated hypertension, diabetes hyperlipidemia and continued tobacco abuse. He was admitted with acute coronary syndrome and had minimally elevated enzymes with nonspecific ST and T-wave changes. He presents now for diagnostic coronary artery arteriography to define his anatomy.   PROCEDURE DESCRIPTION:   The patient was brought to the second floor [facility] cath lab in the postabsorptive state. He was premedicated with Valium 5 mg by mouth, IV Versed and fentanyl. His right wrist was prepped and shaved in usual sterile fashion. Xylocaine 1% was used for local anesthesia. A 5/6 French sheath was inserted into the right radial artery using standard Seldinger technique. The patient received 3500 units  of heparin  intravenously.  A 5 [place] TIG catheter, left Judkins 3.5 cm curve catheter and pigtail catheters were used for selective coronary angiography and left ventriculography respectively. Visipaque dye was used for the entirety of the case. Retrograde aortic, left ventricular and pullback pressures were recorded. Radial cocktail was administered. A side-arm sheath.    HEMODYNAMICS:    AO SYSTOLIC/AO DIASTOLIC: 110/53   LV SYSTOLIC/LV DIASTOLIC: 110/13  ANGIOGRAPHIC RESULTS:   1. Left main; normal  2. LAD; 80% proximal and mid and mid to distal with a diffusely diseased moderate-sized diagonal branch 3. Left circumflex; codominant with occluded stents in the mid AV groove and left. PDA.  4. Right coronary artery; codominant with an occluded stent in the midportion 5.  Left ventriculography; RAO left ventriculogram was performed using  25 mL of Visipaque dye at 12 mL/second. The overall LVEF estimated  25-30 %  With  wall motion abnormalities notable for severe anteroapical hypokinesia and moderate inferobasal hypokinesia  IMPRESSION:Mr. Isaac Ray has 3 vessel disease with severe LV dysfunction. Both stented arteries are occluded and he has got diffuse LAD and diagonal branch disease. He will need coronary artery bypass grafting. TCTS has been notified. The sheath was removed and a TR band was placed on the right wrist to achieve patent hemostasis. The patient left the lab in stable condition. I will restart heparin 3 hours after TR band removal.  Isaac Ray. MD, [facility] 02/14/2015 12:56 PM

## 2015-02-14 NOTE — Consult Note (Signed)
Reason for Consult:3 vessel CAD, s/ NSTEMI Referring Physician: Dr. Berry  Isaac Ray is an 44 y.o. male.  HPI: 44 yo man with a history of CAD presents with unstable chest pain.  I interviewed the patient with a Spanish language interpreter.  Mr. Ray is a 44 yo Hispanic male from [place]. He is in [place] visiting his daughter. He has a history of CAD with 2 prior MI, treated with PTCA and stenting. He has multiple risk factors including hypertension, hyperlipidemia, type II diabetes, a strong family history and ongoing tobacco abuse. He presented with a 24 hour history of chest pain and shortness of breath. He ttok nitroglycerin and had relief, but it would recur when the nitro wore off. His ECG showed non specific ST changes. He ruled in for a NQWMI.   At catheterization today he had severe 3 vessel CAD with occlusion of stents in his RCA and circumflex and significant stenoses in his LAD and a large diagonal. EF was 25%. He denies pain at present.   Past Medical History  Diagnosis Date  . Essential hypertension   . Type 2 diabetes mellitus   . Hyperlipidemia   . CAD (coronary artery disease)     Past Surgical History  Procedure Laterality Date  . Coronary angioplasty with stent placement  2013    Family History  Problem Relation Age of Onset  . Coronary artery disease Father     had CABG    Social History:  reports that he has been smoking.  He does not have any smokeless tobacco history on file. He reports that he does not drink alcohol or use illicit drugs.  Married  Allergies: No Known Allergies  Medications:  Prior to Admission:  Prescriptions prior to admission  Medication Sig Dispense Refill Last Dose  . aspirin 325 MG tablet Take 325 mg by mouth daily.   02/11/2015 at Unknown time  . isosorbide dinitrate (ISORDIL) 30 MG tablet Take 30 mg by mouth 4 (four) times daily.   02/11/2015 at Unknown time  . lisinopril (PRINIVIL,ZESTRIL) 5 MG tablet Take 5 mg  by mouth daily.   02/11/2015 at Unknown time  . metFORMIN (GLUCOPHAGE) 500 MG tablet Take 500 mg by mouth 2 (two) times daily with a meal.   02/11/2015 at Unknown time  . nitroGLYCERIN (NITROSTAT) 0.4 MG SL tablet Place 0.4 mg under the tongue every 5 (five) minutes as needed for chest pain.   unknown at unknwon    Results for orders placed or performed during the hospital encounter of 02/11/15 (from the past 48 hour(s))  Glucose, capillary     Status: Abnormal   Collection Time: 02/12/15  8:55 PM  Result Value Ref Range   Glucose-Capillary 236 (H) 70 - 99 mg/dL   Comment 1 Notify RN    Comment 2 Document in Chart   CBC     Status: None   Collection Time: 02/13/15  5:20 AM  Result Value Ref Range   WBC 8.7 4.0 - 10.5 K/uL   RBC 4.76 4.22 - 5.81 MIL/uL   Hemoglobin 14.6 13.0 - 17.0 g/dL   HCT 43.1 39.0 - 52.0 %   MCV 90.5 78.0 - 100.0 fL   MCH 30.7 26.0 - 34.0 pg   MCHC 33.9 30.0 - 36.0 g/dL   RDW 13.1 11.5 - 15.5 %   Platelets 175 150 - 400 K/uL  Hemoglobin A1c     Status: Abnormal   Collection Time: 02/13/15  5:20   AM  Result Value Ref Range   Hgb A1c MFr Bld 10.2 (H) 4.8 - 5.6 %    Comment: (NOTE)         Pre-diabetes: 5.7 - 6.4         Diabetes: >6.4         Glycemic control for adults with diabetes: <7.0    Mean Plasma Glucose 246 mg/dL    Comment: (NOTE) Performed At: [facility] [address] [place], [place] [id number] [place] William F MD Ph:[phone]   Glucose, capillary     Status: Abnormal   Collection Time: 02/13/15  6:25 AM  Result Value Ref Range   Glucose-Capillary 207 (H) 70 - 99 mg/dL   Comment 1 Notify RN   Heparin level (unfractionated)     Status: Abnormal   Collection Time: 02/13/15 10:16 AM  Result Value Ref Range   Heparin Unfractionated 0.14 (L) 0.30 - 0.70 IU/mL    Comment:        IF HEPARIN RESULTS ARE BELOW EXPECTED VALUES, AND PATIENT DOSAGE HAS BEEN CONFIRMED, SUGGEST FOLLOW UP TESTING OF ANTITHROMBIN III LEVELS.   Glucose,  capillary     Status: Abnormal   Collection Time: 02/13/15 11:25 AM  Result Value Ref Range   Glucose-Capillary 337 (H) 70 - 99 mg/dL  Glucose, capillary     Status: Abnormal   Collection Time: 02/13/15  4:09 PM  Result Value Ref Range   Glucose-Capillary 148 (H) 70 - 99 mg/dL  Heparin level (unfractionated)     Status: None   Collection Time: 02/13/15  6:48 PM  Result Value Ref Range   Heparin Unfractionated 0.38 0.30 - 0.70 IU/mL    Comment:        IF HEPARIN RESULTS ARE BELOW EXPECTED VALUES, AND PATIENT DOSAGE HAS BEEN CONFIRMED, SUGGEST FOLLOW UP TESTING OF ANTITHROMBIN III LEVELS.   Glucose, capillary     Status: Abnormal   Collection Time: 02/13/15 10:10 PM  Result Value Ref Range   Glucose-Capillary 258 (H) 70 - 99 mg/dL   Comment 1 Notify RN    Comment 2 Document in Chart   Basic metabolic panel     Status: Abnormal   Collection Time: 02/14/15  3:14 AM  Result Value Ref Range   Sodium 139 135 - 145 mmol/L   Potassium 3.9 3.5 - 5.1 mmol/L   Chloride 106 96 - 112 mmol/L   CO2 25 19 - 32 mmol/L   Glucose, Bld 192 (H) 70 - 99 mg/dL   BUN 9 6 - 23 mg/dL   Creatinine, Ser 1.06 0.50 - 1.35 mg/dL   Calcium 8.7 8.4 - 10.5 mg/dL   GFR calc non Af Amer 83 (L) >90 mL/min   GFR calc Af Amer >90 >90 mL/min    Comment: (NOTE) The eGFR has been calculated using the CKD EPI equation. This calculation has not been validated in all clinical situations. eGFR's persistently <90 mL/min signify possible Chronic Kidney Disease.    Anion gap 8 5 - 15  CBC     Status: None   Collection Time: 02/14/15  3:14 AM  Result Value Ref Range   WBC 8.7 4.0 - 10.5 K/uL   RBC 4.50 4.22 - 5.81 MIL/uL   Hemoglobin 13.9 13.0 - 17.0 g/dL   HCT 41.0 39.0 - 52.0 %   MCV 91.1 78.0 - 100.0 fL   MCH 30.9 26.0 - 34.0 pg   MCHC 33.9 30.0 - 36.0 g/dL   RDW 13.2 11.5 -   15.5 %   Platelets 174 150 - 400 K/uL  Heparin level (unfractionated)     Status: None   Collection Time: 02/14/15  3:14 AM  Result  Value Ref Range   Heparin Unfractionated 0.44 0.30 - 0.70 IU/mL    Comment:        IF HEPARIN RESULTS ARE BELOW EXPECTED VALUES, AND PATIENT DOSAGE HAS BEEN CONFIRMED, SUGGEST FOLLOW UP TESTING OF ANTITHROMBIN III LEVELS.   Glucose, capillary     Status: Abnormal   Collection Time: 02/14/15  6:08 AM  Result Value Ref Range   Glucose-Capillary 184 (H) 70 - 99 mg/dL   Comment 1 Notify RN    Comment 2 Document in Chart   Glucose, capillary     Status: Abnormal   Collection Time: 02/14/15 11:35 AM  Result Value Ref Range   Glucose-Capillary 131 (H) 70 - 99 mg/dL   Comment 1 Notify RN     No results found.  Review of Systems  Constitutional: Positive for malaise/fatigue. Negative for fever.  Respiratory: Positive for shortness of breath. Negative for cough.   Cardiovascular: Positive for chest pain. Negative for claudication and leg swelling.  Neurological: Negative.   Endo/Heme/Allergies: Does not bruise/bleed easily.  All other systems reviewed and are negative.  Blood pressure 113/63, pulse 51, temperature 98 F (36.7 C), temperature source Oral, resp. rate 18, height 5' 7" (1.702 m), weight 159 lb 4.8 oz (72.258 kg), SpO2 95 %. Physical Exam  Vitals reviewed. Constitutional: He is oriented to person, place, and time. He appears well-developed and well-nourished. No distress.  HENT:  Head: Normocephalic and atraumatic.  Eyes: EOM are normal.  Neck: Neck supple. No thyromegaly present.  Cardiovascular: Normal rate, regular rhythm and intact distal pulses.  Exam reveals gallop (+S4).   No murmur heard. Normal Allen's test on left  Respiratory: Effort normal and breath sounds normal. He has no wheezes. He has no rales.  GI: Soft. There is no tenderness.  Musculoskeletal: He exhibits no edema.  Lymphadenopathy:    He has no cervical adenopathy.  Neurological: He is alert and oriented to person, place, and time. No cranial nerve deficit.  No focal motor deficit  Skin: Skin  is warm and dry.   HEMODYNAMICS:   AO SYSTOLIC/AO DIASTOLIC: 110/53  LV SYSTOLIC/LV DIASTOLIC: 110/13  ANGIOGRAPHIC RESULTS:   1. Left main; normal  2. LAD; 80% proximal and mid and mid to distal with a diffusely diseased moderate-sized diagonal branch 3. Left circumflex; codominant with occluded stents in the mid AV groove and left. PDA.  4. Right coronary artery; codominant with an occluded stent in the midportion 5. Left ventriculography; RAO left ventriculogram was performed using  25 mL of Visipaque dye at 12 mL/second. The overall LVEF estimated  25-30 % With wall motion abnormalities notable for severe anteroapical hypokinesia and moderate inferobasal hypokinesia  IMPRESSION:Mr. Ray has 3 vessel disease with severe LV dysfunction. Both stented arteries are occluded and he has got diffuse LAD and diagonal branch disease. He will need coronary artery bypass grafting. TCTS has been notified. The sheath was removed and a TR band was placed on the right wrist to achieve patent hemostasis. The patient left the lab in stable condition. I will restart heparin 3 hours after TR band removal.  BERRY,JONATHAN J. MD, FACC 02/14/2015 12:56 PM  Assessment/Plan: 43 yo man with multiple CRF and a history of known CAD presents with unstable chest pain and ruled in for a NSTEMI. At catheterization he has   occluded stents in the circumflex and RCA. He has significant LAD and diagonal disease. CABG is indicated for survival benefit adn relief of symptoms.  Given his young age he may benefit from a radial artery graft and his Allen's test appears normal. Will plan to use left radial in addition to LIMA and saphenous vein.  I discussed the general nature of the procedure, the need for general anesthesia, and the incisions to be used with Mr and Mrs Ray. I discussed the expected hospital stay, overall recovery and short and long term outcomes. I reviewed the indications, risks, benefits adn  alternatives. They understand the risks include, but are not limited to death, stroke, MI, DVT/PE, bleeding, possible need for transfusion, infections, cardiac arrhythmias, and other organ system dysfunction including respiratory, renal, or GI complications. They understand and accept these risks and agree to proceed.  Plan CABG Wednesday 4/20  Pawan Knechtel C Aidah Forquer 02/14/2015, 4:53 PM      

## 2015-02-14 NOTE — Progress Notes (Signed)
PA Rhonda calls RN asked if cath site was ok, and to educate pt on some irritation to be expected d/t procedure. PA to order some pain medication and monitoring site. Pt informed along with wife at bedside and all voices understanding. Will continue to monitor pt quietly. Arabella Merles Avory Rahimi RN.

## 2015-02-14 NOTE — Progress Notes (Addendum)
Patient Profile: 43 y/o male visiting from [place] with a history of CAD s/p mid LAD stent placement at [facility] in June 2013, T2DM, HLD and HTN admitted with chest pain. Ruled in for NSTEMI.    Subjective: Denies any current CP or dyspnea. Family by bedside. His daughter's boyfriend served as interpreter.   Objective: Vital signs in last 24 hours: Temp:  [97.5 F (36.4 C)-98.1 F (36.7 C)] 97.5 F (36.4 C) (04/18 0501) Pulse Rate:  [51-61] 61 (04/18 0501) Resp:  [17-18] 18 (04/18 0501) BP: (114-141)/(52-73) 141/73 mmHg (04/18 0501) SpO2:  [96 %-99 %] 99 % (04/18 0501) Last BM Date: 02/10/15  Intake/Output from previous day: 04/17 0701 - 04/18 0700 In: 736 [P.O.:360; I.V.:376] Out: -  Intake/Output this shift:    Medications Current Facility-Administered Medications  Medication Dose Route Frequency Provider Last Rate Last Dose  . 0.9 %  sodium chloride infusion  250 mL Intravenous PRN Samuel G McDowell, MD      . 0.9 %  sodium chloride infusion   Intravenous Continuous Samuel G McDowell, MD 75 mL/hr at 02/14/15 0650    . acetaminophen (TYLENOL) tablet 650 mg  650 mg Oral Q4H PRN Anton Lishmanov, MD   650 mg at 02/12/15 0625  . aspirin EC tablet 81 mg  81 mg Oral Daily Anton Lishmanov, MD   81 mg at 02/13/15 1056  . atorvastatin (LIPITOR) tablet 80 mg  80 mg Oral q1800 Anton Lishmanov, MD   80 mg at 02/13/15 1900  . heparin ADULT infusion 100 units/mL (25000 units/250 mL)  1,300 Units/hr Intravenous Continuous Jonathan F Branch, MD 13 mL/hr at 02/14/15 0650 1,300 Units/hr at 02/14/15 0650  . insulin aspart (novoLOG) injection 0-15 Units  0-15 Units Subcutaneous TID WC Anton Lishmanov, MD   3 Units at 02/14/15 0644  . insulin aspart (novoLOG) injection 0-5 Units  0-5 Units Subcutaneous QHS Anton Lishmanov, MD   3 Units at 02/13/15 2214  . insulin detemir (LEVEMIR) injection 10 Units  10 Units Subcutaneous QHS Anton Lishmanov, MD   10 Units at 02/13/15 2215  . isosorbide mononitrate  (IMDUR) 24 hr tablet 60 mg  60 mg Oral Daily Anton Lishmanov, MD   60 mg at 02/13/15 1055  . lisinopril (PRINIVIL,ZESTRIL) tablet 5 mg  5 mg Oral Daily Anton Lishmanov, MD   5 mg at 02/13/15 1056  . metoprolol tartrate (LOPRESSOR) tablet 12.5 mg  12.5 mg Oral BID Samuel G McDowell, MD   12.5 mg at 02/13/15 2214  . nitroGLYCERIN (NITROSTAT) SL tablet 0.4 mg  0.4 mg Sublingual Q5 Min x 3 PRN Anton Lishmanov, MD   0.4 mg at 02/13/15 0744  . ondansetron (ZOFRAN) injection 4 mg  4 mg Intravenous Q6H PRN Anton Lishmanov, MD      . sodium chloride 0.9 % injection 3 mL  3 mL Intravenous Q12H Samuel G McDowell, MD   3 mL at 02/13/15 2216  . sodium chloride 0.9 % injection 3 mL  3 mL Intravenous PRN Samuel G McDowell, MD      . zolpidem (AMBIEN) tablet 5 mg  5 mg Oral QHS PRN,MR X 1 Anton Lishmanov, MD        PE: General appearance: alert, cooperative and no distress Neck: no carotid bruit and no JVD Lungs: clear to auscultation bilaterally Heart: regular rate and rhythm, S1, S2 normal, no murmur, click, rub or gallop Extremities: no LEE Pulses: 2+ and symmetric Skin: warm and dry Neurologic: Grossly normal  Lab Results:     Recent Labs  02/12/15 0347 02/13/15 0520 02/14/15 0314  WBC 9.7 8.7 8.7  HGB 14.8 14.6 13.9  HCT 42.7 43.1 41.0  PLT 186 175 174   BMET  Recent Labs  02/11/15 2106 02/12/15 0347 02/14/15 0314  NA 136 138 139  K 3.7 4.1 3.9  CL 104 105 106  CO2 22 25 25  GLUCOSE 305* 220* 192*  BUN 8 7 9  CREATININE 1.00 1.02 1.06  CALCIUM 9.6 9.1 8.7   PT/INR  Recent Labs  02/12/15 0347  LABPROT 14.0  INR 1.07   Cholesterol  Recent Labs  02/12/15 0347  CHOL 158   Cardiac Panel (last 3 results)  Recent Labs  02/12/15 0347 02/12/15 0753 02/12/15 1359  TROPONINI 0.49* 0.60* 0.65*    Assessment/Plan  Active Problems:   NSTEMI (non-ST elevated myocardial infarction)   1. NSTEMI: CP free. Plan for LHC +/- PCI today. Continue IV heparin, ASA, Lipitor,  metoprolol. Per RN, consent for cath was signed yesterday with help of interpreter.   2. HTN: mildly elevated. HR in the low 60s. Continue current dose of metoprolol, lisinopril and imdur.   3. DM: Hgb A1c pending. Metformin on hold. SSI ordered. F/u with PCP in [place].  4. HLD: LDL elevated at 98 mg/dL. Given known h/o CAD, LDL goal is < 70. Continue statin therapy with Lipitor. Will need to f/u with primary cardiologist/PCP in [place].      LOS: 2 days    Brittainy M. Simmons, PA-C 02/14/2015 8:59 AM  Patinet seen and examined  I agree with findings of B Simmons above   Denies CP  LUngs CTA  Cardiac RRR.  Ext No edema  For L heart cath today.  Ashdon Gillson  

## 2015-02-14 NOTE — Progress Notes (Signed)
Pt TR band removed at 1440; site level 0, sterile dsg applied clean, dry and intact; no bruising, hematoma or active bleeding noted. VSS, will continue to monitor quietly per protocol. Arabella Merles  Ryott Rafferty RN.

## 2015-02-14 NOTE — Interval H&P Note (Signed)
Cath Lab Visit (complete for each Cath Lab visit)  Clinical Evaluation Leading to the Procedure:   ACS: Yes.    Non-ACS:    Anginal Classification: CCS IV  Anti-ischemic medical therapy: Minimal Therapy (1 class of medications)  Non-Invasive Test Results: No non-invasive testing performed  Prior CABG: No previous CABG      History and Physical Interval Note:  02/14/2015 12:07 PM  Isaac Ray  has presented today for surgery, with the diagnosis of NSTEMI  The various methods of treatment have been discussed with the patient and family. After consideration of risks, benefits and other options for treatment, the patient has consented to  Procedure(s): LEFT HEART CATHETERIZATION WITH CORONARY ANGIOGRAM (N/A) as a surgical intervention .  The patient's history has been reviewed, patient examined, no change in status, stable for surgery.  I have reviewed the patient's chart and labs.  Questions were answered to the patient's satisfaction.     Runell Gess

## 2015-02-14 NOTE — Progress Notes (Signed)
CARE MANAGEMENT NOTE 02/14/2015  Patient:  KHALE, BREY   Account Number:  [id number]  Date Initiated:  02/14/2015  Documentation initiated by:  [facility]  Subjective/Objective Assessment:   NSTEMI     Action/Plan:   lives at home with wife, Harriett Sine   Anticipated DC Date:     Anticipated DC Plan:  HOME/SELF CARE      DC Planning Services  CM consult  Indigent [facility]  Medication Assistance      Choice offered to / List presented to:             Status of service:  In process, will continue to follow Medicare Important Message given?   (If response is "NO", the following Medicare IM given date fields will be blank) Date Medicare IM given:   Medicare IM given by:   Date Additional Medicare IM given:   Additional Medicare IM given by:    Discharge Disposition:    Per UR Regulation:    If discussed at Long Length of Stay Meetings, dates discussed:    Comments:  02/14/2015 1400 NCM contacted Spanish Interpreter. Provided pt with [facility] brochure and explained NCM will arrange follow up appt at time of dc and he can pick up his meds from the pharamcy at dc. Waiting final dc recommendations for home. Isidoro Donning RN CCM Case Mgmt phone [phone]

## 2015-02-14 NOTE — Progress Notes (Signed)
Pt VSS, cath site level 0, no bruising, hematoma or active bleeding noted; pt c/o pain to right hand around thumb to wrist area. Soft and warm to touch, capillary refill less than 3secs, dsg to cath site clean, dry and intact. Pt denies any numbness or tingling. Bryan in cath lab notified and he said he will get PA to come see pt.

## 2015-02-14 NOTE — Progress Notes (Signed)
Pt transported off unit to cath lab for procedure. Pt heparin drip stopped per protocol. P. Amo Jull Harral RN. 

## 2015-02-14 NOTE — Progress Notes (Signed)
Pt right radial cath level 0; dsg clean, dry and intact; denies any pain, prn med effective; pt sleeping quietly in bed with call light within reach and spouse at bedside. Reported off to incoming RN. Arabella Merles Tanisa Lagace RN.

## 2015-02-14 NOTE — Progress Notes (Signed)
Interpreter Wyvonnia Dusky for [facility] RN care management

## 2015-02-14 NOTE — Progress Notes (Signed)
ANTICOAGULATION CONSULT NOTE - Follow Up Consult  Pharmacy Consult for Heparin Indication: chest pain/ACS  No Known Allergies  Patient Measurements: Height: 5\' 7"  (170.2 cm) Weight: 159 lb 4.8 oz (72.258 kg) IBW/kg (Calculated) : 66.1 Heparin Dosing Weight:   Vital Signs: Temp: 97.5 F (36.4 C) (04/18 0501) Temp Source: Oral (04/18 0501) BP: 141/73 mmHg (04/18 0501) Pulse Rate: 61 (04/18 0501)  Labs:  Recent Labs  02/11/15 2106 02/12/15 0347  02/12/15 0753 02/12/15 1359 02/13/15 0520 02/13/15 1016 02/13/15 1848 02/14/15 0314  HGB 15.2 14.8  --   --   --  14.6  --   --  13.9  HCT 44.2 42.7  --   --   --  43.1  --   --  41.0  PLT 192 186  --   --   --  175  --   --  174  APTT 29  --   --   --   --   --   --   --   --   LABPROT  --  14.0  --   --   --   --   --   --   --   INR  --  1.07  --   --   --   --   --   --   --   HEPARINUNFRC  --   --   < > 0.43 0.35  --  0.14* 0.38 0.44  CREATININE 1.00 1.02  --   --   --   --   --   --  1.06  TROPONINI 0.35* 0.49*  --  0.60* 0.65*  --   --   --   --   < > = values in this interval not displayed.  Estimated Creatinine Clearance: 82.3 mL/min (by C-G formula based on Cr of 1.06).   Medications:  Scheduled:  . aspirin EC  81 mg Oral Daily  . atorvastatin  80 mg Oral q1800  . insulin aspart  0-15 Units Subcutaneous TID WC  . insulin aspart  0-5 Units Subcutaneous QHS  . insulin detemir  10 Units Subcutaneous QHS  . isosorbide mononitrate  60 mg Oral Daily  . lisinopril  5 mg Oral Daily  . metoprolol tartrate  12.5 mg Oral BID  . sodium chloride  3 mL Intravenous Q12H    Assessment: 44yo male with NSTEMI, for cath today.  Heparin level is therapeutic on 1300 units/hr.  Hg 13.9 and pltc is stable.  No bleeding problems noted per d/w RN.   Goal of Therapy:  Heparin level 0.3-0.7 units/ml Monitor platelets by anticoagulation protocol: Yes   Plan:  Continue current rate F/U after cath  Marisue Humble, PharmD Clinical  Pharmacist [facility]

## 2015-02-14 NOTE — H&P (View-Only) (Signed)
Patient Profile: 44 y/o male visiting from [place] with a history of CAD s/p mid LAD stent placement at [facility] in June 2013, T2DM, HLD and HTN admitted with chest pain. Ruled in for NSTEMI.    Subjective: Denies any current CP or dyspnea. Family by bedside. His daughter's boyfriend served as Equities trader.   Objective: Vital signs in last 24 hours: Temp:  [97.5 F (36.4 C)-98.1 F (36.7 C)] 97.5 F (36.4 C) (04/18 0501) Pulse Rate:  [51-61] 61 (04/18 0501) Resp:  [17-18] 18 (04/18 0501) BP: (114-141)/(52-73) 141/73 mmHg (04/18 0501) SpO2:  [96 %-99 %] 99 % (04/18 0501) Last BM Date: 02/10/15  Intake/Output from previous day: 04/17 0701 - 04/18 0700 In: 736 [P.O.:360; I.V.:376] Out: -  Intake/Output this shift:    Medications Current Facility-Administered Medications  Medication Dose Route Frequency Provider Last Rate Last Dose  . 0.9 %  sodium chloride infusion  250 mL Intravenous PRN Jonelle Sidle, MD      . 0.9 %  sodium chloride infusion   Intravenous Continuous Jonelle Sidle, MD 75 mL/hr at 02/14/15 [phone]    . acetaminophen (TYLENOL) tablet 650 mg  650 mg Oral Q4H PRN Nolon Nations, MD   650 mg at 02/12/15 9604  . aspirin EC tablet 81 mg  81 mg Oral Daily Nolon Nations, MD   81 mg at 02/13/15 1056  . atorvastatin (LIPITOR) tablet 80 mg  80 mg Oral q1800 Nolon Nations, MD   80 mg at 02/13/15 1900  . heparin ADULT infusion 100 units/mL (25000 units/250 mL)  1,300 Units/hr Intravenous Continuous Antoine Poche, MD 13 mL/hr at 02/14/15 0650 1,300 Units/hr at 02/14/15 0650  . insulin aspart (novoLOG) injection 0-15 Units  0-15 Units Subcutaneous TID WC Nolon Nations, MD   3 Units at 02/14/15 [phone]  . insulin aspart (novoLOG) injection 0-5 Units  0-5 Units Subcutaneous QHS Nolon Nations, MD   3 Units at 02/13/15 2214  . insulin detemir (LEVEMIR) injection 10 Units  10 Units Subcutaneous QHS Nolon Nations, MD   10 Units at 02/13/15 2215  . isosorbide mononitrate  (IMDUR) 24 hr tablet 60 mg  60 mg Oral Daily Nolon Nations, MD   60 mg at 02/13/15 1055  . lisinopril (PRINIVIL,ZESTRIL) tablet 5 mg  5 mg Oral Daily Nolon Nations, MD   5 mg at 02/13/15 1056  . metoprolol tartrate (LOPRESSOR) tablet 12.5 mg  12.5 mg Oral BID Jonelle Sidle, MD   12.5 mg at 02/13/15 2214  . nitroGLYCERIN (NITROSTAT) SL tablet 0.4 mg  0.4 mg Sublingual Q5 Min x 3 PRN Nolon Nations, MD   0.4 mg at 02/13/15 0744  . ondansetron (ZOFRAN) injection 4 mg  4 mg Intravenous Q6H PRN Nolon Nations, MD      . sodium chloride 0.9 % injection 3 mL  3 mL Intravenous Q12H Jonelle Sidle, MD   3 mL at 02/13/15 2216  . sodium chloride 0.9 % injection 3 mL  3 mL Intravenous PRN Jonelle Sidle, MD      . zolpidem (AMBIEN) tablet 5 mg  5 mg Oral QHS PRN,MR X 1 Nolon Nations, MD        PE: General appearance: alert, cooperative and no distress Neck: no carotid bruit and no JVD Lungs: clear to auscultation bilaterally Heart: regular rate and rhythm, S1, S2 normal, no murmur, click, rub or gallop Extremities: no LEE Pulses: 2+ and symmetric Skin: warm and dry Neurologic: Grossly normal  Lab Results:  Recent Labs  02/12/15 0347 02/13/15 0520 02/14/15 0314  WBC 9.7 8.7 8.7  HGB 14.8 14.6 13.9  HCT 42.7 43.1 41.0  PLT 186 175 174   BMET  Recent Labs  02/11/15 2106 02/12/15 0347 02/14/15 0314  NA 136 138 139  K 3.7 4.1 3.9  CL 104 105 106  CO2 GLUCOSE 305* 220* 192*  BUN CREATININE 1.00 1.02 1.06  CALCIUM 9.6 9.1 8.7   PT/INR  Recent Labs  02/12/15 0347  LABPROT 14.0  INR 1.07   Cholesterol  Recent Labs  02/12/15 0347  CHOL 158   Cardiac Panel (last 3 results)  Recent Labs  02/12/15 0347 02/12/15 0753 02/12/15 1359  TROPONINI 0.49* 0.60* 0.65*    Assessment/Plan  Active Problems:   NSTEMI (non-ST elevated myocardial infarction)   1. NSTEMI: CP free. Plan for [facility] +/- PCI today. Continue IV heparin, ASA, Lipitor,  metoprolol. Per RN, consent for cath was signed yesterday with help of interpreter.   2. HTN: mildly elevated. HR in the low 60s. Continue current dose of metoprolol, lisinopril and imdur.   3. DM: Hgb A1c pending. Metformin on hold. SSI ordered. F/u with PCP in [place].  4. HLD: LDL elevated at 98 mg/dL. Given known h/o CAD, LDL goal is < 70. Continue statin therapy with Lipitor. Will need to f/u with primary cardiologist/PCP in [place].      LOS: 2 days    Brittainy M. Delmer Islam 02/14/2015 8:59 AM  Patinet seen and examined  I agree with findings of B Sharol Harness above   Denies CP  LUngs CTA  Cardiac RRR.  Ext No edema  For L heart cath today.  Dietrich Pates

## 2015-02-15 ENCOUNTER — Inpatient Hospital Stay (HOSPITAL_COMMUNITY): Payer: Medicaid Other

## 2015-02-15 LAB — PULMONARY FUNCTION TEST
DL/VA % PRED: 106 %
DL/VA: 4.76 ml/min/mmHg/L
DLCO cor % pred: 88 %
DLCO cor: 25.17 ml/min/mmHg
DLCO unc % pred: 87 %
DLCO unc: 24.8 ml/min/mmHg
FEF 25-75 Post: 2.3 L/sec
FEF 25-75 Pre: 1.25 L/sec
FEF2575-%CHANGE-POST: 83 %
FEF2575-%PRED-POST: 67 %
FEF2575-%Pred-Pre: 36 %
FEV1-%CHANGE-POST: 21 %
FEV1-%Pred-Post: 65 %
FEV1-%Pred-Pre: 54 %
FEV1-Post: 2.42 L
FEV1-Pre: 1.99 L
FEV1FVC-%CHANGE-POST: -4 %
FEV1FVC-%PRED-PRE: 88 %
FEV6-%Change-Post: 28 %
FEV6-%PRED-POST: 78 %
FEV6-%Pred-Pre: 61 %
FEV6-Post: 3.56 L
FEV6-Pre: 2.78 L
FEV6FVC-%Change-Post: 0 %
FEV6FVC-%PRED-PRE: 100 %
FEV6FVC-%Pred-Post: 101 %
FVC-%Change-Post: 27 %
FVC-%Pred-Post: 77 %
FVC-%Pred-Pre: 60 %
FVC-POST: 3.63 L
FVC-Pre: 2.85 L
POST FEV1/FVC RATIO: 67 %
POST FEV6/FVC RATIO: 98 %
PRE FEV6/FVC RATIO: 98 %
Pre FEV1/FVC ratio: 70 %
RV % PRED: 162 %
RV: 2.88 L
TLC % PRED: 97 %
TLC: 6.16 L

## 2015-02-15 LAB — CBC
HCT: 40.7 % (ref 39.0–52.0)
HEMATOCRIT: 40.4 % (ref 39.0–52.0)
Hemoglobin: 14.1 g/dL (ref 13.0–17.0)
Hemoglobin: 13.5 g/dL (ref 13.0–17.0)
MCH: 31.1 pg (ref 26.0–34.0)
MCH: 30.3 pg (ref 26.0–34.0)
MCHC: 34.6 g/dL (ref 30.0–36.0)
MCHC: 33.4 g/dL (ref 30.0–36.0)
MCV: 89.6 fL (ref 78.0–100.0)
MCV: 90.8 fL (ref 78.0–100.0)
PLATELETS: 170 10*3/uL (ref 150–400)
Platelets: 167 10*3/uL (ref 150–400)
RBC: 4.54 MIL/uL (ref 4.22–5.81)
RBC: 4.45 MIL/uL (ref 4.22–5.81)
RDW: 13.2 % (ref 11.5–15.5)
RDW: 13.2 % (ref 11.5–15.5)
WBC: 8.9 10*3/uL (ref 4.0–10.5)
WBC: 7.9 10*3/uL (ref 4.0–10.5)

## 2015-02-15 LAB — COMPREHENSIVE METABOLIC PANEL
ALBUMIN: 3.4 g/dL — AB (ref 3.5–5.2)
ALT: 74 U/L — AB (ref 0–53)
AST: 41 U/L — AB (ref 0–37)
Alkaline Phosphatase: 66 U/L (ref 39–117)
Anion gap: 9 (ref 5–15)
BUN: 11 mg/dL (ref 6–23)
CO2: 25 mmol/L (ref 19–32)
Calcium: 9.1 mg/dL (ref 8.4–10.5)
Chloride: 103 mmol/L (ref 96–112)
Creatinine, Ser: 1.02 mg/dL (ref 0.50–1.35)
GFR calc Af Amer: >90 mL/min (ref >90)
GFR calc non Af Amer: 87 mL/min — ABNORMAL LOW (ref >90)
Glucose, Bld: 236 mg/dL — ABNORMAL HIGH (ref 70–99)
Potassium: 3.6 mmol/L (ref 3.5–5.1)
SODIUM: 137 mmol/L (ref 135–145)
TOTAL PROTEIN: 6.3 g/dL (ref 6.0–8.3)
Total Bilirubin: 0.6 mg/dL (ref 0.3–1.2)

## 2015-02-15 LAB — PROTIME-INR
INR: 1.11 (ref 0.00–1.49)
PROTHROMBIN TIME: 14.4 s (ref 11.6–15.2)

## 2015-02-15 LAB — URINALYSIS, ROUTINE W REFLEX MICROSCOPIC
BILIRUBIN URINE: NEGATIVE
Glucose, UA: 250 mg/dL — AB
Hgb urine dipstick: NEGATIVE
Ketones, ur: NEGATIVE mg/dL
LEUKOCYTES UA: NEGATIVE
NITRITE: NEGATIVE
PH: 6 (ref 5.0–8.0)
Protein, ur: NEGATIVE mg/dL
SPECIFIC GRAVITY, URINE: 1.02 (ref 1.005–1.030)
UROBILINOGEN UA: 2 mg/dL — AB (ref 0.0–1.0)

## 2015-02-15 LAB — ABO/RH: ABO/RH(D): A POS

## 2015-02-15 LAB — POCT I-STAT 3, ART BLOOD GAS (G3+)
ACID-BASE EXCESS: 2 mmol/L (ref 0.0–2.0)
Bicarbonate: 23.8 mEq/L (ref 20.0–24.0)
O2 SAT: 99 %
Patient temperature: 98.6
TCO2: 25 mmol/L (ref 0–100)
pCO2 arterial: 28.4 mmHg — ABNORMAL LOW (ref 35.0–45.0)
pH, Arterial: 7.532 — ABNORMAL HIGH (ref 7.350–7.450)
pO2, Arterial: 141 mmHg — ABNORMAL HIGH (ref 80.0–100.0)

## 2015-02-15 LAB — GLUCOSE, CAPILLARY
GLUCOSE-CAPILLARY: 149 mg/dL — AB (ref 70–99)
Glucose-Capillary: 182 mg/dL — ABNORMAL HIGH (ref 70–99)
Glucose-Capillary: 173 mg/dL — ABNORMAL HIGH (ref 70–99)

## 2015-02-15 LAB — TYPE AND SCREEN
ABO/RH(D): A POS
Antibody Screen: NEGATIVE

## 2015-02-15 LAB — APTT: APTT: 93 s — AB (ref 24–37)

## 2015-02-15 LAB — HEPARIN LEVEL (UNFRACTIONATED): Heparin Unfractionated: 0.37 IU/mL (ref 0.30–0.70)

## 2015-02-15 MED ORDER — NITROGLYCERIN IN D5W 200-5 MCG/ML-% IV SOLN
2.0000 ug/min | INTRAVENOUS | Status: AC
  Administered 2015-02-16: 5 ug/min via INTRAVENOUS
  Filled 2015-02-15: qty 250

## 2015-02-15 MED ORDER — DOPAMINE-DEXTROSE 3.2-5 MG/ML-% IV SOLN
0.0000 ug/kg/min | INTRAVENOUS | Status: AC
  Administered 2015-02-16: 5 ug/kg/min via INTRAVENOUS
  Filled 2015-02-15: qty 250

## 2015-02-15 MED ORDER — PLASMA-LYTE 148 IV SOLN
INTRAVENOUS | Status: AC
  Administered 2015-02-16: 500 mL
  Filled 2015-02-15: qty 2.5

## 2015-02-15 MED ORDER — DIAZEPAM 5 MG PO TABS
5.0000 mg | ORAL_TABLET | Freq: Once | ORAL | Status: AC
  Administered 2015-02-16: 5 mg via ORAL
  Filled 2015-02-15: qty 1

## 2015-02-15 MED ORDER — VANCOMYCIN HCL 10 G IV SOLR
1250.0000 mg | INTRAVENOUS | Status: AC
  Administered 2015-02-16: 1250 mg via INTRAVENOUS
  Filled 2015-02-15 (×2): qty 1250

## 2015-02-15 MED ORDER — SODIUM CHLORIDE 0.9 % IV SOLN
INTRAVENOUS | Status: AC
  Administered 2015-02-16: 5 mL/h via INTRAVENOUS
  Filled 2015-02-15: qty 40

## 2015-02-15 MED ORDER — SODIUM CHLORIDE 0.9 % IV SOLN
INTRAVENOUS | Status: DC
  Filled 2015-02-15: qty 30

## 2015-02-15 MED ORDER — SODIUM CHLORIDE 0.9 % IV SOLN
INTRAVENOUS | Status: AC
  Administered 2015-02-16: 2.4 [IU]/h via INTRAVENOUS
  Filled 2015-02-15: qty 2.5

## 2015-02-15 MED ORDER — POTASSIUM CHLORIDE 2 MEQ/ML IV SOLN
80.0000 meq | INTRAVENOUS | Status: DC
  Filled 2015-02-15: qty 40

## 2015-02-15 MED ORDER — DEXTROSE 5 % IV SOLN
750.0000 mg | INTRAVENOUS | Status: DC
  Filled 2015-02-15: qty 750

## 2015-02-15 MED ORDER — PHENYLEPHRINE HCL 10 MG/ML IJ SOLN
30.0000 ug/min | INTRAVENOUS | Status: AC
  Administered 2015-02-16: 30 ug/min via INTRAVENOUS
  Administered 2015-02-16: 25 ug/min via INTRAVENOUS
  Filled 2015-02-15: qty 2

## 2015-02-15 MED ORDER — DEXMEDETOMIDINE HCL IN NACL 400 MCG/100ML IV SOLN
0.1000 ug/kg/h | INTRAVENOUS | Status: AC
  Administered 2015-02-16: 0.3 ug/kg/h via INTRAVENOUS
  Filled 2015-02-15: qty 100

## 2015-02-15 MED ORDER — DEXTROSE 5 % IV SOLN
0.0000 ug/min | INTRAVENOUS | Status: DC
  Filled 2015-02-15: qty 4

## 2015-02-15 MED ORDER — DEXTROSE 5 % IV SOLN
1.5000 g | INTRAVENOUS | Status: AC
  Administered 2015-02-16: .75 g via INTRAVENOUS
  Administered 2015-02-16: 1.5 g via INTRAVENOUS
  Filled 2015-02-15 (×2): qty 1.5

## 2015-02-15 MED ORDER — CHLORHEXIDINE GLUCONATE CLOTH 2 % EX PADS
6.0000 | MEDICATED_PAD | Freq: Once | CUTANEOUS | Status: AC
Start: 2015-02-15 — End: 2015-02-16
  Administered 2015-02-16: 6 via TOPICAL

## 2015-02-15 MED ORDER — ALBUTEROL SULFATE (2.5 MG/3ML) 0.083% IN NEBU
2.5000 mg | INHALATION_SOLUTION | Freq: Once | RESPIRATORY_TRACT | Status: AC
  Administered 2015-02-15: 2.5 mg via RESPIRATORY_TRACT

## 2015-02-15 MED ORDER — METOPROLOL TARTRATE 12.5 MG HALF TABLET
12.5000 mg | ORAL_TABLET | Freq: Once | ORAL | Status: DC
  Filled 2015-02-15: qty 1

## 2015-02-15 MED ORDER — LIVING WELL WITH DIABETES BOOK - IN SPANISH
Freq: Once | Status: AC
  Administered 2015-02-15: 18:00:00
  Filled 2015-02-15 (×2): qty 1

## 2015-02-15 MED ORDER — MAGNESIUM SULFATE 50 % IJ SOLN
40.0000 meq | INTRAMUSCULAR | Status: DC
  Filled 2015-02-15: qty 10

## 2015-02-15 MED ORDER — CHLORHEXIDINE GLUCONATE CLOTH 2 % EX PADS
6.0000 | MEDICATED_PAD | Freq: Once | CUTANEOUS | Status: AC
  Administered 2015-02-15: 6 via TOPICAL

## 2015-02-15 NOTE — Progress Notes (Signed)
RT note: ABG @ 1547 drawn on room air from Right brachial, due to Right radial artery having dressing remaining from Heart Catheterization and Left radial Artery being planned for harvesting for CABG procedure 02/16/2015.

## 2015-02-15 NOTE — Progress Notes (Signed)
VASCULAR LAB PRELIMINARY  PRELIMINARY  PRELIMINARY  PRELIMINARY  Pre-op Cardiac Surgery  Carotid Findings:  Right 1% to 39% distal ICA stenosis by velocities. Plaque morphology does not support the increase. Pobably secondary to tortuosity. Vertebral artery flow is antegrade. Left - 40% to 59% ICA stenosis lower end of range. Vertebral artery flow is antegrade.   Upper Extremity Right Left  Brachial Pressures 131 Triphasic 131 Triphasic  Radial Waveforms Triphasic Triphasic  Ulnar Waveforms Triphasic Triphasic  Palmar Arch (Allen's Test) Normal Normal   Findings:  Doppler waveforms remained normal bilaterally with both radial and ulnar compressions.    Lower  Extremity Right Left  Dorsalis Pedis 126 Triphasic 149 Triphasic  Posterior Tibial 145 Triphasic 139 Triphasic  Ankle/Brachial Indices 1.11 1.14    Findings:  ABIs and Doppler waveforms are within normal limits bilaterally at rest.   Faithe Ariola, RVS 02/15/2015, 6:26 PM

## 2015-02-15 NOTE — Progress Notes (Signed)
   Subjective: No CP  No SOB   Objective: Filed Vitals:   02/14/15 1945 02/14/15 2100 02/15/15 0502 02/15/15 0918  BP: 124/63 141/81 126/55 110/54  Pulse: 56 63 50 56  Temp: 98.2 F (36.8 C)  97.6 F (36.4 C)   TempSrc: Oral  Oral   Resp: 16 18 18    Height:      Weight:      SpO2: 94% 96% 96%    Weight change:   Intake/Output Summary (Last 24 hours) at 02/15/15 1014 Last data filed at 02/15/15 0730  Gross per 24 hour  Intake 316.25 ml  Output      0 ml  Net 316.25 ml    General: Alert, awake, oriented x3, in no acute distress Neck:  JVP is normal Heart: Regular rate and rhythm, without murmurs, rubs, gallops.  Lungs: Clear to auscultation.  No rales or wheezes. Exemities:  No edema.   Neuro: Grossly intact, nonfocal.  Tele:  SR   Lab Results: Results for orders placed or performed during the hospital encounter of 02/11/15 (from the past 24 hour(s))  Glucose, capillary     Status: Abnormal   Collection Time: 02/14/15 11:35 AM  Result Value Ref Range   Glucose-Capillary 131 (H) 70 - 99 mg/dL   Comment 1 Notify RN   Glucose, capillary     Status: Abnormal   Collection Time: 02/14/15  5:17 PM  Result Value Ref Range   Glucose-Capillary 223 (H) 70 - 99 mg/dL   Comment 1 Notify RN   Glucose, capillary     Status: Abnormal   Collection Time: 02/14/15  9:04 PM  Result Value Ref Range   Glucose-Capillary 144 (H) 70 - 99 mg/dL  Heparin level (unfractionated)     Status: None   Collection Time: 02/15/15  1:30 AM  Result Value Ref Range   Heparin Unfractionated 0.37 0.30 - 0.70 IU/mL  CBC     Status: None   Collection Time: 02/15/15  1:30 AM  Result Value Ref Range   WBC 8.9 4.0 - 10.5 K/uL   RBC 4.54 4.22 - 5.81 MIL/uL   Hemoglobin 14.1 13.0 - 17.0 g/dL   HCT 66.4 40.3 - 47.4 %   MCV 89.6 78.0 - 100.0 fL   MCH 31.1 26.0 - 34.0 pg   MCHC 34.6 30.0 - 36.0 g/dL   RDW 25.9 56.3 - 87.5 %   Platelets 170 150 - 400 K/uL  Glucose, capillary     Status: Abnormal   Collection Time: 02/15/15  6:29 AM  Result Value Ref Range   Glucose-Capillary 182 (H) 70 - 99 mg/dL    Studies/Results: No results found.  Medications:Reviewed    @PROBHOSP @  1  CAD  Plan for CABG tomorrow   Patinet is chest pain free    2,  HTN  Good control    3  DM  Metformin on hold  4.   HL  Continue lipitor    LOS: 3 days   Dietrich Pates 02/15/2015, 10:14 AM

## 2015-02-15 NOTE — Progress Notes (Signed)
CARDIAC REHAB PHASE I   PRE:  Rate/Rhythm: 53 SR  BP:  Sitting: 120/61        SaO2: 97 RA  MODE:  Ambulation: 750 ft   POST:  Rate/Rhythm: 48 Sb  BP:  Sitting: 120/81         SaO2: 98 RA  Pt ambulated 750 ft on RA, hand held assist, IV pole, steady gait, tolerated well. Denies cp, dizziness, SOB. Pt states he does feel  "a little tired" upon returning to room. Pre-op education done, pt demonstrated IS use, reviewed sternal precautions, and mobility. Gave pt cardiac surgery book in Spanish and instructions for cardiac surgery videos in Spanish. Pt verbalized understanding.   [id number]  Joylene Grapes, RN, BSN 02/15/2015 10:44 AM

## 2015-02-15 NOTE — Progress Notes (Signed)
Inpatient Diabetes Program Recommendations  AACE/ADA: New Consensus Statement on Inpatient Glycemic Control (2013)  Target Ranges:  Prepandial:   less than 140 mg/dL      Peak postprandial:   less than 180 mg/dL (1-2 hours)      Critically ill patients:  140 - 180 mg/dL   Reason for Assessment:   Results for Isaac Ray, Isaac Ray (MRN [id number]) as of 02/15/2015 09:43  Ref. Range 02/13/2015 05:20  Hemoglobin A1C Latest Ref Range: 4.8-5.6 % 10.2 (H)   Diabetes history: Type 2 diabetes Outpatient Diabetes medications: Metformin 500 mg bid Current orders for Inpatient glycemic control: Levemir 10 units daily, Novolog moderate tid with meals and HS  A1C indicates probable need for insulin at discharge.  Note that patient is scheduled for CABG on 02/16/15.  May consider increasing Levemir to 15 units daily.    Thanks, Beryl Meager, RN, BC-ADM Inpatient Diabetes Coordinator Pager [phone] (8a-5p)

## 2015-02-15 NOTE — Progress Notes (Signed)
Utilization review completed.  

## 2015-02-15 NOTE — Progress Notes (Signed)
ANTICOAGULATION CONSULT NOTE  Pharmacy Consult for Heparin Indication: chest pain/ACS  No Known Allergies  Patient Measurements: Height: 5\' 7"  (170.2 cm) Weight: 159 lb 4.8 oz (72.258 kg) IBW/kg (Calculated) : 66.1 Heparin Dosing Weight:   Vital Signs: Temp: 98.2 F (36.8 C) (04/18 1945) Temp Source: Oral (04/18 1945) BP: 141/81 mmHg (04/18 2100) Pulse Rate: 63 (04/18 2100)  Labs:  Recent Labs  02/12/15 0347  02/12/15 0753 02/12/15 1359 02/13/15 0520  02/13/15 1848 02/14/15 0314 02/15/15 0130  HGB 14.8  --   --   --  14.6  --   --  13.9 14.1  HCT 42.7  --   --   --  43.1  --   --  41.0 40.7  PLT 186  --   --   --  175  --   --  174 170  LABPROT 14.0  --   --   --   --   --   --   --   --   INR 1.07  --   --   --   --   --   --   --   --   HEPARINUNFRC  --   < > 0.43 0.35  --   < > 0.38 0.44 0.37  CREATININE 1.02  --   --   --   --   --   --  1.06  --   TROPONINI 0.49*  --  0.60* 0.65*  --   --   --   --   --   < > = values in this interval not displayed.  Estimated Creatinine Clearance: 82.3 mL/min (by C-G formula based on Cr of 1.06).  Assesment: 44 y.o. male with CAD s/p cath, awaiting CABG, for heparin   Goal of Therapy:  Heparin level 0.3-0.7 units/ml Monitor platelets by anticoagulation protocol: Yes   Plan:  Continue Heparin at current rate   Geannie Risen, PharmD, BCPS

## 2015-02-16 ENCOUNTER — Encounter (HOSPITAL_COMMUNITY)
Admission: EM | Disposition: A | Discharge status: Home / Self Care | Payer: Self-pay | Source: Home / Self Care | Attending: Cardiology

## 2015-02-16 ENCOUNTER — Inpatient Hospital Stay (HOSPITAL_COMMUNITY): Payer: Medicaid Other

## 2015-02-16 ENCOUNTER — Encounter (HOSPITAL_COMMUNITY): Payer: Self-pay | Admitting: Certified Registered"

## 2015-02-16 ENCOUNTER — Inpatient Hospital Stay (HOSPITAL_COMMUNITY): Payer: Medicaid Other | Admitting: Anesthesiology

## 2015-02-16 DIAGNOSIS — Z951 Presence of aortocoronary bypass graft: Secondary | ICD-10-CM

## 2015-02-16 HISTORY — PX: TEE WITHOUT CARDIOVERSION: SHX5443

## 2015-02-16 HISTORY — PX: RADIAL ARTERY HARVEST: SHX5067

## 2015-02-16 HISTORY — PX: CORONARY ARTERY BYPASS GRAFT: SHX141

## 2015-02-16 LAB — POCT I-STAT, CHEM 8
BUN: 13 mg/dL (ref 6–23)
BUN: 13 mg/dL (ref 6–23)
BUN: 11 mg/dL (ref 6–23)
BUN: 12 mg/dL (ref 6–23)
BUN: 13 mg/dL (ref 6–23)
BUN: 12 mg/dL (ref 6–23)
CALCIUM ION: 1.29 mmol/L — AB (ref 1.12–1.23)
CALCIUM ION: 1.27 mmol/L — AB (ref 1.12–1.23)
CALCIUM ION: 1.07 mmol/L — AB (ref 1.12–1.23)
CALCIUM ION: 1.18 mmol/L (ref 1.12–1.23)
CHLORIDE: 104 mmol/L (ref 96–112)
CHLORIDE: 102 mmol/L (ref 96–112)
CREATININE: 0.8 mg/dL (ref 0.50–1.35)
CREATININE: 0.8 mg/dL (ref 0.50–1.35)
Calcium, Ion: 1.05 mmol/L — ABNORMAL LOW (ref 1.12–1.23)
Calcium, Ion: 1.06 mmol/L — ABNORMAL LOW (ref 1.12–1.23)
Chloride: 104 mmol/L (ref 96–112)
Chloride: 96 mmol/L (ref 96–112)
Chloride: 103 mmol/L (ref 96–112)
Chloride: 105 mmol/L (ref 96–112)
Creatinine, Ser: 0.7 mg/dL (ref 0.50–1.35)
Creatinine, Ser: 0.7 mg/dL (ref 0.50–1.35)
Creatinine, Ser: 0.7 mg/dL (ref 0.50–1.35)
Creatinine, Ser: 0.9 mg/dL (ref 0.50–1.35)
GLUCOSE: 133 mg/dL — AB (ref 70–99)
GLUCOSE: 183 mg/dL — AB (ref 70–99)
GLUCOSE: 151 mg/dL — AB (ref 70–99)
Glucose, Bld: 181 mg/dL — ABNORMAL HIGH (ref 70–99)
Glucose, Bld: 162 mg/dL — ABNORMAL HIGH (ref 70–99)
Glucose, Bld: 165 mg/dL — ABNORMAL HIGH (ref 70–99)
HCT: 32 % — ABNORMAL LOW (ref 39.0–52.0)
HCT: 47 % (ref 39.0–52.0)
HEMATOCRIT: 44 % (ref 39.0–52.0)
HEMATOCRIT: 40 % (ref 39.0–52.0)
HEMATOCRIT: 29 % — AB (ref 39.0–52.0)
HEMATOCRIT: 33 % — AB (ref 39.0–52.0)
HEMOGLOBIN: 10.9 g/dL — AB (ref 13.0–17.0)
HEMOGLOBIN: 16 g/dL (ref 13.0–17.0)
Hemoglobin: 15 g/dL (ref 13.0–17.0)
Hemoglobin: 13.6 g/dL (ref 13.0–17.0)
Hemoglobin: 9.9 g/dL — ABNORMAL LOW (ref 13.0–17.0)
Hemoglobin: 11.2 g/dL — ABNORMAL LOW (ref 13.0–17.0)
POTASSIUM: 4 mmol/L (ref 3.5–5.1)
Potassium: 4.1 mmol/L (ref 3.5–5.1)
Potassium: 4.3 mmol/L (ref 3.5–5.1)
Potassium: 4.5 mmol/L (ref 3.5–5.1)
Potassium: 5.6 mmol/L — ABNORMAL HIGH (ref 3.5–5.1)
Potassium: 3.9 mmol/L (ref 3.5–5.1)
Sodium: 140 mmol/L (ref 135–145)
Sodium: 139 mmol/L (ref 135–145)
Sodium: 137 mmol/L (ref 135–145)
Sodium: 139 mmol/L (ref 135–145)
Sodium: 139 mmol/L (ref 135–145)
Sodium: 140 mmol/L (ref 135–145)
TCO2: 23 mmol/L (ref 0–100)
TCO2: 23 mmol/L (ref 0–100)
TCO2: 23 mmol/L (ref 0–100)
TCO2: 21 mmol/L (ref 0–100)
TCO2: 23 mmol/L (ref 0–100)
TCO2: 20 mmol/L (ref 0–100)

## 2015-02-16 LAB — POCT I-STAT 4, (NA,K, GLUC, HGB,HCT)
Glucose, Bld: 173 mg/dL — ABNORMAL HIGH (ref 70–99)
HCT: 46 % (ref 39.0–52.0)
Hemoglobin: 15.6 g/dL (ref 13.0–17.0)
Potassium: 5.6 mmol/L — ABNORMAL HIGH (ref 3.5–5.1)
Sodium: 140 mmol/L (ref 135–145)

## 2015-02-16 LAB — POCT I-STAT GLUCOSE
GLUCOSE: 150 mg/dL — AB (ref 70–99)
Glucose, Bld: 164 mg/dL — ABNORMAL HIGH (ref 70–99)
OPERATOR ID: 3406
Operator id: 333012

## 2015-02-16 LAB — GLUCOSE, CAPILLARY
GLUCOSE-CAPILLARY: 162 mg/dL — AB (ref 70–99)
GLUCOSE-CAPILLARY: 101 mg/dL — AB (ref 70–99)
GLUCOSE-CAPILLARY: 151 mg/dL — AB (ref 70–99)
Glucose-Capillary: 190 mg/dL — ABNORMAL HIGH (ref 70–99)
Glucose-Capillary: 122 mg/dL — ABNORMAL HIGH (ref 70–99)
Glucose-Capillary: 101 mg/dL — ABNORMAL HIGH (ref 70–99)
Glucose-Capillary: 120 mg/dL — ABNORMAL HIGH (ref 70–99)
Glucose-Capillary: 159 mg/dL — ABNORMAL HIGH (ref 70–99)

## 2015-02-16 LAB — CBC
HCT: 44.7 % (ref 39.0–52.0)
HEMATOCRIT: 43.5 % (ref 39.0–52.0)
HEMOGLOBIN: 14.8 g/dL (ref 13.0–17.0)
HEMOGLOBIN: 15.4 g/dL (ref 13.0–17.0)
MCH: 30.7 pg (ref 26.0–34.0)
MCH: 30.9 pg (ref 26.0–34.0)
MCHC: 34 g/dL (ref 30.0–36.0)
MCHC: 34.5 g/dL (ref 30.0–36.0)
MCV: 90.2 fL (ref 78.0–100.0)
MCV: 89.8 fL (ref 78.0–100.0)
Platelets: 165 10*3/uL (ref 150–400)
Platelets: 99 10*3/uL — ABNORMAL LOW (ref 150–400)
RBC: 4.82 MIL/uL (ref 4.22–5.81)
RBC: 4.98 MIL/uL (ref 4.22–5.81)
RDW: 13.2 % (ref 11.5–15.5)
RDW: 12.9 % (ref 11.5–15.5)
WBC: 10.1 10*3/uL (ref 4.0–10.5)
WBC: 20.2 10*3/uL — AB (ref 4.0–10.5)

## 2015-02-16 LAB — POCT I-STAT 3, ART BLOOD GAS (G3+)
ACID-BASE DEFICIT: 3 mmol/L — AB (ref 0.0–2.0)
ACID-BASE DEFICIT: 3 mmol/L — AB (ref 0.0–2.0)
Acid-base deficit: 4 mmol/L — ABNORMAL HIGH (ref 0.0–2.0)
Acid-base deficit: 1 mmol/L (ref 0.0–2.0)
Acid-base deficit: 3 mmol/L — ABNORMAL HIGH (ref 0.0–2.0)
BICARBONATE: 24.3 meq/L — AB (ref 20.0–24.0)
BICARBONATE: 22.4 meq/L (ref 20.0–24.0)
BICARBONATE: 23.8 meq/L (ref 20.0–24.0)
Bicarbonate: 24.9 meq/L — ABNORMAL HIGH (ref 20.0–24.0)
Bicarbonate: 23.4 meq/L (ref 20.0–24.0)
O2 SAT: 100 %
O2 Saturation: 100 %
O2 Saturation: 93 %
O2 Saturation: 99 %
O2 Saturation: 94 %
PH ART: 7.358 (ref 7.350–7.450)
PH ART: 7.282 — AB (ref 7.350–7.450)
PO2 ART: 260 mmHg — AB (ref 80.0–100.0)
PO2 ART: 77 mmHg — AB (ref 80.0–100.0)
Patient temperature: 37.6
Patient temperature: 37.9
TCO2: 26 mmol/L (ref 0–100)
TCO2: 24 mmol/L (ref 0–100)
TCO2: 25 mmol/L (ref 0–100)
TCO2: 26 mmol/L (ref 0–100)
TCO2: 25 mmol/L (ref 0–100)
pCO2 arterial: 50.8 mmHg — ABNORMAL HIGH (ref 35.0–45.0)
pCO2 arterial: 39.8 mmHg (ref 35.0–45.0)
pCO2 arterial: 50.3 mmHg — ABNORMAL HIGH (ref 35.0–45.0)
pCO2 arterial: 46.1 mmHg — ABNORMAL HIGH (ref 35.0–45.0)
pCO2 arterial: 46.4 mmHg — ABNORMAL HIGH (ref 35.0–45.0)
pH, Arterial: 7.287 — ABNORMAL LOW (ref 7.350–7.450)
pH, Arterial: 7.343 — ABNORMAL LOW (ref 7.350–7.450)
pH, Arterial: 7.316 — ABNORMAL LOW (ref 7.350–7.450)
pO2, Arterial: 263 mmHg — ABNORMAL HIGH (ref 80.0–100.0)
pO2, Arterial: 140 mmHg — ABNORMAL HIGH (ref 80.0–100.0)
pO2, Arterial: 82 mmHg (ref 80.0–100.0)

## 2015-02-16 LAB — PROTIME-INR
INR: 1.24 (ref 0.00–1.49)
Prothrombin Time: 15.7 seconds — ABNORMAL HIGH (ref 11.6–15.2)

## 2015-02-16 LAB — HEPARIN LEVEL (UNFRACTIONATED): HEPARIN UNFRACTIONATED: 0.44 [IU]/mL (ref 0.30–0.70)

## 2015-02-16 LAB — SURGICAL PCR SCREEN
MRSA, PCR: NEGATIVE
Staphylococcus aureus: NEGATIVE

## 2015-02-16 LAB — PLATELET COUNT: Platelets: 108 10*3/uL — ABNORMAL LOW (ref 150–400)

## 2015-02-16 LAB — HEMOGLOBIN AND HEMATOCRIT, BLOOD
HEMATOCRIT: 32.7 % — AB (ref 39.0–52.0)
HEMOGLOBIN: 11.4 g/dL — AB (ref 13.0–17.0)

## 2015-02-16 LAB — CREATININE, SERUM: CREATININE: 0.93 mg/dL (ref 0.50–1.35)

## 2015-02-16 LAB — APTT: aPTT: 32 seconds (ref 24–37)

## 2015-02-16 LAB — MAGNESIUM: Magnesium: 2.6 mg/dL — ABNORMAL HIGH (ref 1.5–2.5)

## 2015-02-16 SURGERY — CORONARY ARTERY BYPASS GRAFTING (CABG)
Anesthesia: General | Site: Chest

## 2015-02-16 MED ORDER — DEXMEDETOMIDINE HCL IN NACL 200 MCG/50ML IV SOLN
0.0000 ug/kg/h | INTRAVENOUS | Status: DC
  Filled 2015-02-16: qty 50

## 2015-02-16 MED ORDER — ONDANSETRON HCL 4 MG/2ML IJ SOLN: 4.0000 mg | Freq: Four times a day (QID) | INTRAMUSCULAR | Status: DC | PRN

## 2015-02-16 MED ORDER — ASPIRIN EC 325 MG PO TBEC
325.0000 mg | DELAYED_RELEASE_TABLET | Freq: Every day | ORAL | Status: DC
  Administered 2015-02-17 – 2015-02-20 (×4): 325 mg via ORAL
  Filled 2015-02-16 (×4): qty 1

## 2015-02-16 MED ORDER — POTASSIUM CHLORIDE 10 MEQ/50ML IV SOLN: 10.0000 meq | INTRAVENOUS | Status: AC

## 2015-02-16 MED ORDER — 0.9 % SODIUM CHLORIDE (POUR BTL) OPTIME
TOPICAL | Status: DC | PRN
  Administered 2015-02-16: 6000 mL

## 2015-02-16 MED ORDER — METOPROLOL TARTRATE 1 MG/ML IV SOLN: 2.5000 mg | INTRAVENOUS | Status: DC | PRN

## 2015-02-16 MED ORDER — SODIUM CHLORIDE 0.9 % IV SOLN: INTRAVENOUS | Status: DC

## 2015-02-16 MED ORDER — FENTANYL CITRATE (PF) 250 MCG/5ML IJ SOLN
INTRAMUSCULAR | Status: AC
  Filled 2015-02-16: qty 5

## 2015-02-16 MED ORDER — MORPHINE SULFATE 2 MG/ML IJ SOLN
1.0000 mg | INTRAMUSCULAR | Status: AC | PRN
  Administered 2015-02-16: 2 mg via INTRAVENOUS
  Filled 2015-02-16 (×2): qty 1

## 2015-02-16 MED ORDER — SODIUM BICARBONATE 4.2 % IV SOLN
INTRAVENOUS | Status: DC | PRN
  Administered 2015-02-16: 50 meq via INTRAVENOUS

## 2015-02-16 MED ORDER — BISACODYL 10 MG RE SUPP: 10.0000 mg | Freq: Every day | RECTAL | Status: DC

## 2015-02-16 MED ORDER — VECURONIUM BROMIDE 10 MG IV SOLR
INTRAVENOUS | Status: AC
  Filled 2015-02-16: qty 10

## 2015-02-16 MED ORDER — FENTANYL CITRATE (PF) 100 MCG/2ML IJ SOLN
INTRAMUSCULAR | Status: DC | PRN
  Administered 2015-02-16: 100 ug via INTRAVENOUS
  Administered 2015-02-16: 150 ug via INTRAVENOUS
  Administered 2015-02-16: 250 ug via INTRAVENOUS
  Administered 2015-02-16 (×3): 50 ug via INTRAVENOUS
  Administered 2015-02-16: 400 ug via INTRAVENOUS
  Administered 2015-02-16: 100 ug via INTRAVENOUS
  Administered 2015-02-16 (×3): 50 ug via INTRAVENOUS
  Administered 2015-02-16: 650 ug via INTRAVENOUS
  Administered 2015-02-16 (×2): 100 ug via INTRAVENOUS

## 2015-02-16 MED ORDER — NITROGLYCERIN 0.2 MG/ML ON CALL CATH LAB
INTRAVENOUS | Status: DC | PRN
  Administered 2015-02-16: 10 ug via INTRAVENOUS
  Administered 2015-02-16 (×5): 20 ug via INTRAVENOUS
  Administered 2015-02-16: 30 ug via INTRAVENOUS
  Administered 2015-02-16: 20 ug via INTRAVENOUS

## 2015-02-16 MED ORDER — SODIUM CHLORIDE 0.9 % IV SOLN
INTRAVENOUS | Status: DC
  Filled 2015-02-16 (×2): qty 2.5

## 2015-02-16 MED ORDER — PROTAMINE SULFATE 10 MG/ML IV SOLN
INTRAVENOUS | Status: DC | PRN
  Administered 2015-02-16: 20 mg via INTRAVENOUS
  Administered 2015-02-16: 60 mg via INTRAVENOUS
  Administered 2015-02-16: 20 mg via INTRAVENOUS
  Administered 2015-02-16: 60 mg via INTRAVENOUS
  Administered 2015-02-16 (×2): 40 mg via INTRAVENOUS

## 2015-02-16 MED ORDER — MIDAZOLAM HCL 10 MG/2ML IJ SOLN
INTRAMUSCULAR | Status: AC
  Filled 2015-02-16: qty 2

## 2015-02-16 MED ORDER — ARTIFICIAL TEARS OP OINT
TOPICAL_OINTMENT | OPHTHALMIC | Status: DC | PRN
  Administered 2015-02-16: 1 via OPHTHALMIC

## 2015-02-16 MED ORDER — METOPROLOL TARTRATE 25 MG/10 ML ORAL SUSPENSION
12.5000 mg | Freq: Two times a day (BID) | ORAL | Status: DC
  Filled 2015-02-16 (×9): qty 5

## 2015-02-16 MED ORDER — PROPOFOL 10 MG/ML IV BOLUS
INTRAVENOUS | Status: AC
  Filled 2015-02-16: qty 20

## 2015-02-16 MED ORDER — LACTATED RINGERS IV SOLN: 500.0000 mL | Freq: Once | INTRAVENOUS | Status: AC | PRN

## 2015-02-16 MED ORDER — ACETAMINOPHEN 500 MG PO TABS
1000.0000 mg | ORAL_TABLET | Freq: Four times a day (QID) | ORAL | Status: DC
  Administered 2015-02-16 – 2015-02-20 (×12): 1000 mg via ORAL
  Filled 2015-02-16 (×21): qty 2

## 2015-02-16 MED ORDER — SODIUM CHLORIDE 0.9 % IJ SOLN
3.0000 mL | Freq: Two times a day (BID) | INTRAMUSCULAR | Status: DC
  Administered 2015-02-17 – 2015-02-18 (×3): 3 mL via INTRAVENOUS

## 2015-02-16 MED ORDER — LACTATED RINGERS IV SOLN
INTRAVENOUS | Status: DC | PRN
  Administered 2015-02-16 (×4): via INTRAVENOUS

## 2015-02-16 MED ORDER — MIDAZOLAM HCL 5 MG/5ML IJ SOLN
INTRAMUSCULAR | Status: DC | PRN
  Administered 2015-02-16: 1 mg via INTRAVENOUS
  Administered 2015-02-16 (×2): 2 mg via INTRAVENOUS
  Administered 2015-02-16: 3 mg via INTRAVENOUS
  Administered 2015-02-16: 2 mg via INTRAVENOUS

## 2015-02-16 MED ORDER — SODIUM CHLORIDE 0.9 % IJ SOLN
INTRAMUSCULAR | Status: AC
  Filled 2015-02-16: qty 10

## 2015-02-16 MED ORDER — PROTAMINE SULFATE 10 MG/ML IV SOLN
INTRAVENOUS | Status: AC
  Filled 2015-02-16: qty 25

## 2015-02-16 MED ORDER — ROCURONIUM BROMIDE 100 MG/10ML IV SOLN
INTRAVENOUS | Status: DC | PRN
  Administered 2015-02-16: 100 mg via INTRAVENOUS

## 2015-02-16 MED ORDER — HEMOSTATIC AGENTS (NO CHARGE) OPTIME
TOPICAL | Status: DC | PRN
  Administered 2015-02-16: 1 via TOPICAL

## 2015-02-16 MED ORDER — ARTIFICIAL TEARS OP OINT
TOPICAL_OINTMENT | OPHTHALMIC | Status: AC
  Filled 2015-02-16: qty 3.5

## 2015-02-16 MED ORDER — HEPARIN SODIUM (PORCINE) 1000 UNIT/ML IJ SOLN
INTRAMUSCULAR | Status: AC
  Filled 2015-02-16: qty 1

## 2015-02-16 MED ORDER — SODIUM CHLORIDE 0.45 % IV SOLN: INTRAVENOUS | Status: DC | PRN

## 2015-02-16 MED ORDER — SODIUM CHLORIDE 0.9 % IJ SOLN
INTRAMUSCULAR | Status: DC | PRN
  Administered 2015-02-16: 4 mL via TOPICAL

## 2015-02-16 MED ORDER — DEXTROSE 5 % IV SOLN
0.0000 ug/min | INTRAVENOUS | Status: DC
  Filled 2015-02-16: qty 2

## 2015-02-16 MED ORDER — NITROGLYCERIN IN D5W 200-5 MCG/ML-% IV SOLN: 0.0000 ug/min | INTRAVENOUS | Status: AC

## 2015-02-16 MED ORDER — SODIUM CHLORIDE 0.9 % IV SOLN: 250.0000 mL | INTRAVENOUS | Status: DC

## 2015-02-16 MED ORDER — VECURONIUM BROMIDE 10 MG IV SOLR
INTRAVENOUS | Status: DC | PRN
  Administered 2015-02-16 (×2): 2 mg via INTRAVENOUS
  Administered 2015-02-16: 5 mg via INTRAVENOUS
  Administered 2015-02-16 (×2): 2 mg via INTRAVENOUS

## 2015-02-16 MED ORDER — TRAMADOL HCL 50 MG PO TABS: 50.0000 mg | ORAL_TABLET | ORAL | Status: DC | PRN

## 2015-02-16 MED ORDER — INSULIN REGULAR BOLUS VIA INFUSION
0.0000 [IU] | Freq: Three times a day (TID) | INTRAVENOUS | Status: DC
  Filled 2015-02-16: qty 10

## 2015-02-16 MED ORDER — DOPAMINE-DEXTROSE 3.2-5 MG/ML-% IV SOLN: 0.0000 ug/kg/min | INTRAVENOUS | Status: DC

## 2015-02-16 MED ORDER — METOPROLOL TARTRATE 12.5 MG HALF TABLET
12.5000 mg | ORAL_TABLET | Freq: Two times a day (BID) | ORAL | Status: DC
  Administered 2015-02-17 – 2015-02-20 (×7): 12.5 mg via ORAL
  Filled 2015-02-16 (×9): qty 1

## 2015-02-16 MED ORDER — PROPOFOL 10 MG/ML IV BOLUS
INTRAVENOUS | Status: DC | PRN
  Administered 2015-02-16: 50 mg via INTRAVENOUS

## 2015-02-16 MED ORDER — ASPIRIN 81 MG PO CHEW: 324.0000 mg | CHEWABLE_TABLET | Freq: Every day | ORAL | Status: DC

## 2015-02-16 MED ORDER — EPHEDRINE SULFATE 50 MG/ML IJ SOLN
INTRAMUSCULAR | Status: AC
  Filled 2015-02-16: qty 1

## 2015-02-16 MED ORDER — OXYCODONE HCL 5 MG PO TABS
5.0000 mg | ORAL_TABLET | ORAL | Status: DC | PRN
  Administered 2015-02-16 – 2015-02-17 (×4): 10 mg via ORAL
  Filled 2015-02-16 (×4): qty 2

## 2015-02-16 MED ORDER — ACETAMINOPHEN 160 MG/5ML PO SOLN
1000.0000 mg | Freq: Four times a day (QID) | ORAL | Status: DC
  Filled 2015-02-16: qty 40

## 2015-02-16 MED ORDER — HEPARIN SODIUM (PORCINE) 1000 UNIT/ML IJ SOLN
INTRAMUSCULAR | Status: DC | PRN
  Administered 2015-02-16: 2000 [IU] via INTRAVENOUS
  Administered 2015-02-16: 23000 [IU] via INTRAVENOUS

## 2015-02-16 MED ORDER — MORPHINE SULFATE 2 MG/ML IJ SOLN
2.0000 mg | INTRAMUSCULAR | Status: DC | PRN
Start: 2015-02-16 — End: 2015-02-18
  Administered 2015-02-16 (×2): 2 mg via INTRAVENOUS
  Administered 2015-02-16: 4 mg via INTRAVENOUS
  Administered 2015-02-16: 2 mg via INTRAVENOUS
  Administered 2015-02-17 (×2): 4 mg via INTRAVENOUS
  Administered 2015-02-17: 2 mg via INTRAVENOUS
  Administered 2015-02-17: 4 mg via INTRAVENOUS
  Filled 2015-02-16: qty 2
  Filled 2015-02-16: qty 1
  Filled 2015-02-16 (×4): qty 2

## 2015-02-16 MED ORDER — MAGNESIUM SULFATE 4 GM/100ML IV SOLN
4.0000 g | Freq: Once | INTRAVENOUS | Status: AC
  Administered 2015-02-16: 4 g via INTRAVENOUS
  Filled 2015-02-16: qty 100

## 2015-02-16 MED ORDER — SUCCINYLCHOLINE CHLORIDE 20 MG/ML IJ SOLN
INTRAMUSCULAR | Status: AC
  Filled 2015-02-16: qty 1

## 2015-02-16 MED ORDER — ROCURONIUM BROMIDE 50 MG/5ML IV SOLN
INTRAVENOUS | Status: AC
  Filled 2015-02-16: qty 2

## 2015-02-16 MED ORDER — SODIUM CHLORIDE 0.9 % IV SOLN
0.5000 g/h | Freq: Once | INTRAVENOUS | Status: DC
  Filled 2015-02-16: qty 20

## 2015-02-16 MED ORDER — LACTATED RINGERS IV SOLN: INTRAVENOUS | Status: DC

## 2015-02-16 MED ORDER — STERILE WATER FOR INJECTION IJ SOLN
INTRAMUSCULAR | Status: AC
  Filled 2015-02-16: qty 10

## 2015-02-16 MED ORDER — VANCOMYCIN HCL IN DEXTROSE 1-5 GM/200ML-% IV SOLN
1000.0000 mg | Freq: Once | INTRAVENOUS | Status: AC
  Administered 2015-02-16: 1000 mg via INTRAVENOUS
  Filled 2015-02-16: qty 200

## 2015-02-16 MED ORDER — MIDAZOLAM HCL 2 MG/2ML IJ SOLN
2.0000 mg | INTRAMUSCULAR | Status: DC | PRN
Start: 2015-02-16 — End: 2015-02-16
  Administered 2015-02-16: 2 mg via INTRAVENOUS
  Filled 2015-02-16: qty 2

## 2015-02-16 MED ORDER — ALBUMIN HUMAN 5 % IV SOLN
250.0000 mL | INTRAVENOUS | Status: AC | PRN
  Administered 2015-02-16: 250 mL via INTRAVENOUS

## 2015-02-16 MED ORDER — FAMOTIDINE IN NACL 20-0.9 MG/50ML-% IV SOLN
20.0000 mg | Freq: Two times a day (BID) | INTRAVENOUS | Status: DC
  Administered 2015-02-16: 20 mg via INTRAVENOUS

## 2015-02-16 MED ORDER — SODIUM CHLORIDE 0.9 % IJ SOLN: 3.0000 mL | INTRAMUSCULAR | Status: DC | PRN

## 2015-02-16 MED ORDER — PANTOPRAZOLE SODIUM 40 MG PO TBEC
40.0000 mg | DELAYED_RELEASE_TABLET | Freq: Every day | ORAL | Status: DC
  Administered 2015-02-18 – 2015-02-20 (×3): 40 mg via ORAL
  Filled 2015-02-16 (×3): qty 1

## 2015-02-16 MED ORDER — SODIUM BICARBONATE 8.4 % IV SOLN
50.0000 meq | Freq: Once | INTRAVENOUS | Status: AC
  Administered 2015-02-16: 50 meq via INTRAVENOUS
  Filled 2015-02-16: qty 50

## 2015-02-16 MED ORDER — DOCUSATE SODIUM 100 MG PO CAPS
200.0000 mg | ORAL_CAPSULE | Freq: Every day | ORAL | Status: DC
  Administered 2015-02-17 – 2015-02-19 (×3): 200 mg via ORAL
  Filled 2015-02-16 (×4): qty 2

## 2015-02-16 MED ORDER — ACETAMINOPHEN 160 MG/5ML PO SOLN: 650.0000 mg | Freq: Once | ORAL | Status: AC

## 2015-02-16 MED ORDER — ACETAMINOPHEN 650 MG RE SUPP
650.0000 mg | Freq: Once | RECTAL | Status: AC
  Administered 2015-02-16: 650 mg via RECTAL

## 2015-02-16 MED ORDER — CEFUROXIME SODIUM 1.5 G IJ SOLR
1.5000 g | Freq: Two times a day (BID) | INTRAMUSCULAR | Status: AC
  Administered 2015-02-16 – 2015-02-18 (×4): 1.5 g via INTRAVENOUS
  Filled 2015-02-16 (×4): qty 1.5

## 2015-02-16 MED ORDER — BISACODYL 5 MG PO TBEC
10.0000 mg | DELAYED_RELEASE_TABLET | Freq: Every day | ORAL | Status: DC
  Administered 2015-02-17 – 2015-02-19 (×3): 10 mg via ORAL
  Filled 2015-02-16 (×2): qty 2

## 2015-02-16 MED FILL — Sodium Chloride IV Soln 0.9%: INTRAVENOUS | Qty: 3000 | Status: AC

## 2015-02-16 MED FILL — Mannitol IV Soln 20%: INTRAVENOUS | Qty: 500 | Status: AC

## 2015-02-16 MED FILL — Lidocaine HCl IV Inj 20 MG/ML: INTRAVENOUS | Qty: 5 | Status: AC

## 2015-02-16 MED FILL — Heparin Sodium (Porcine) Inj 1000 Unit/ML: INTRAMUSCULAR | Qty: 20 | Status: AC

## 2015-02-16 MED FILL — Electrolyte-R (PH 7.4) Solution: INTRAVENOUS | Qty: 4000 | Status: AC

## 2015-02-16 MED FILL — Sodium Bicarbonate IV Soln 8.4%: INTRAVENOUS | Qty: 100 | Status: AC

## 2015-02-16 SURGICAL SUPPLY — 108 items
APPLIER CLIP 9.375 SM OPEN (CLIP) ×5
BAG DECANTER FOR FLEXI CONT (MISCELLANEOUS) ×5 IMPLANT
BANDAGE ELASTIC 4 VELCRO ST LF (GAUZE/BANDAGES/DRESSINGS) ×10 IMPLANT
BANDAGE ELASTIC 6 VELCRO ST LF (GAUZE/BANDAGES/DRESSINGS) ×5 IMPLANT
BASKET HEART  (ORDER IN 25'S) (MISCELLANEOUS) ×1
BASKET HEART (ORDER IN 25'S) (MISCELLANEOUS) ×1
BASKET HEART (ORDER IN 25S) (MISCELLANEOUS) ×3 IMPLANT
BLADE STERNUM SYSTEM 6 (BLADE) ×5 IMPLANT
BLADE SURG 15 STRL LF DISP TIS (BLADE) ×3 IMPLANT
BLADE SURG 15 STRL SS (BLADE) ×2
BNDG GAUZE ELAST 4 BULKY (GAUZE/BANDAGES/DRESSINGS) ×10 IMPLANT
CANISTER SUCTION 2500CC (MISCELLANEOUS) ×5 IMPLANT
CANNULA EZ GLIDE AORTIC 21FR (CANNULA) ×5 IMPLANT
CATH CPB KIT HENDRICKSON (MISCELLANEOUS) ×5 IMPLANT
CATH ROBINSON RED A/P 18FR (CATHETERS) ×5 IMPLANT
CATH THORACIC 36FR (CATHETERS) ×5 IMPLANT
CATH THORACIC 36FR RT ANG (CATHETERS) ×5 IMPLANT
CLIP APPLIE 9.375 SM OPEN (CLIP) ×3 IMPLANT
CLIP FOGARTY SPRING 6M (CLIP) ×5 IMPLANT
CLIP TI MEDIUM 24 (CLIP) IMPLANT
CLIP TI WIDE RED SMALL 24 (CLIP) ×15 IMPLANT
COVER MAYO STAND STRL (DRAPES) ×5 IMPLANT
CRADLE DONUT ADULT HEAD (MISCELLANEOUS) ×5 IMPLANT
DRAPE CARDIOVASCULAR INCISE (DRAPES) ×2
DRAPE EXTREMITY T 121X128X90 (DRAPE) ×5 IMPLANT
DRAPE PROXIMA HALF (DRAPES) IMPLANT
DRAPE SLUSH/WARMER DISC (DRAPES) ×5 IMPLANT
DRAPE SRG 135X102X78XABS (DRAPES) ×3 IMPLANT
DRSG COVADERM 4X14 (GAUZE/BANDAGES/DRESSINGS) ×5 IMPLANT
ELECT REM PT RETURN 9FT ADLT (ELECTROSURGICAL) ×10
ELECTRODE REM PT RTRN 9FT ADLT (ELECTROSURGICAL) ×6 IMPLANT
GAUZE SPONGE 4X4 12PLY STRL (GAUZE/BANDAGES/DRESSINGS) ×10 IMPLANT
GEL ULTRASOUND 20GR AQUASONIC (MISCELLANEOUS) IMPLANT
GLOVE BIO SURGEON STRL SZ 6 (GLOVE) ×10 IMPLANT
GLOVE BIO SURGEON STRL SZ 6.5 (GLOVE) ×8 IMPLANT
GLOVE BIO SURGEON STRL SZ7 (GLOVE) ×15 IMPLANT
GLOVE BIO SURGEONS STRL SZ 6.5 (GLOVE) ×2
GLOVE BIOGEL PI IND STRL 6 (GLOVE) ×9 IMPLANT
GLOVE BIOGEL PI IND STRL 6.5 (GLOVE) ×9 IMPLANT
GLOVE BIOGEL PI IND STRL 7.0 (GLOVE) ×9 IMPLANT
GLOVE BIOGEL PI INDICATOR 6 (GLOVE) ×6
GLOVE BIOGEL PI INDICATOR 6.5 (GLOVE) ×6
GLOVE BIOGEL PI INDICATOR 7.0 (GLOVE) ×6
GLOVE SURG SIGNA 7.5 PF LTX (GLOVE) ×15 IMPLANT
GOWN STRL REUS W/ TWL LRG LVL3 (GOWN DISPOSABLE) ×24 IMPLANT
GOWN STRL REUS W/ TWL XL LVL3 (GOWN DISPOSABLE) ×6 IMPLANT
GOWN STRL REUS W/TWL LRG LVL3 (GOWN DISPOSABLE) ×16
GOWN STRL REUS W/TWL XL LVL3 (GOWN DISPOSABLE) ×4
HARMONIC SHEARS 14CM COAG (MISCELLANEOUS) ×5 IMPLANT
HEMOSTAT POWDER SURGIFOAM 1G (HEMOSTASIS) ×15 IMPLANT
HEMOSTAT SURGICEL 2X14 (HEMOSTASIS) ×5 IMPLANT
INSERT FOGARTY XLG (MISCELLANEOUS) IMPLANT
KIT BASIN OR (CUSTOM PROCEDURE TRAY) ×5 IMPLANT
KIT ROOM TURNOVER OR (KITS) ×5 IMPLANT
KIT SUCTION CATH 14FR (SUCTIONS) ×10 IMPLANT
KIT VASOVIEW W/TROCAR VH 2000 (KITS) ×5 IMPLANT
LIQUID BAND (GAUZE/BANDAGES/DRESSINGS) ×5 IMPLANT
MARKER GRAFT CORONARY BYPASS (MISCELLANEOUS) ×15 IMPLANT
MARKER SKIN DUAL TIP RULER LAB (MISCELLANEOUS) ×5 IMPLANT
NS IRRIG 1000ML POUR BTL (IV SOLUTION) ×30 IMPLANT
PACK OPEN HEART (CUSTOM PROCEDURE TRAY) ×5 IMPLANT
PAD ARMBOARD 7.5X6 YLW CONV (MISCELLANEOUS) ×10 IMPLANT
PAD ELECT DEFIB RADIOL ZOLL (MISCELLANEOUS) ×5 IMPLANT
PENCIL BUTTON HOLSTER BLD 10FT (ELECTRODE) ×10 IMPLANT
PUNCH AORTIC ROTATE 4.0MM (MISCELLANEOUS) IMPLANT
PUNCH AORTIC ROTATE 4.5MM 8IN (MISCELLANEOUS) ×5 IMPLANT
PUNCH AORTIC ROTATE 5MM 8IN (MISCELLANEOUS) IMPLANT
SET CARDIOPLEGIA MPS 5001102 (MISCELLANEOUS) ×5 IMPLANT
SOLUTION ANTI FOG 6CC (MISCELLANEOUS) ×5 IMPLANT
SPONGE GAUZE 4X4 12PLY STER LF (GAUZE/BANDAGES/DRESSINGS) ×15 IMPLANT
SPONGE LAP 18X18 X RAY DECT (DISPOSABLE) ×5 IMPLANT
SPONGE LAP 4X18 X RAY DECT (DISPOSABLE) ×5 IMPLANT
SUT BONE WAX W31G (SUTURE) ×5 IMPLANT
SUT ETHIBOND 2 0 SH (SUTURE) ×2
SUT ETHIBOND 2 0 SH 36X2 (SUTURE) ×3 IMPLANT
SUT MNCRL AB 4-0 PS2 18 (SUTURE) ×10 IMPLANT
SUT PROLENE 3 0 SH DA (SUTURE) ×5 IMPLANT
SUT PROLENE 4 0 RB 1 (SUTURE) ×4
SUT PROLENE 4 0 SH DA (SUTURE) IMPLANT
SUT PROLENE 4-0 RB1 .5 CRCL 36 (SUTURE) ×6 IMPLANT
SUT PROLENE 6 0 C 1 30 (SUTURE) ×10 IMPLANT
SUT PROLENE 7 0 BV 1 (SUTURE) ×10 IMPLANT
SUT PROLENE 7 0 BV1 MDA (SUTURE) ×5 IMPLANT
SUT PROLENE 8 0 BV175 6 (SUTURE) ×25 IMPLANT
SUT STEEL 6MS V (SUTURE) ×5 IMPLANT
SUT STEEL STERNAL CCS#1 18IN (SUTURE) IMPLANT
SUT STEEL SZ 6 DBL 3X14 BALL (SUTURE) ×5 IMPLANT
SUT VIC AB 1 CTX 36 (SUTURE) ×4
SUT VIC AB 1 CTX36XBRD ANBCTR (SUTURE) ×6 IMPLANT
SUT VIC AB 2-0 CT1 27 (SUTURE) ×4
SUT VIC AB 2-0 CT1 TAPERPNT 27 (SUTURE) ×6 IMPLANT
SUT VIC AB 2-0 CTX 27 (SUTURE) IMPLANT
SUT VIC AB 3-0 SH 27 (SUTURE)
SUT VIC AB 3-0 SH 27X BRD (SUTURE) IMPLANT
SUT VIC AB 3-0 X1 27 (SUTURE) IMPLANT
SUT VICRYL 4-0 PS2 18IN ABS (SUTURE) IMPLANT
SUTURE E-PAK OPEN HEART (SUTURE) ×5 IMPLANT
SYR 50ML SLIP (SYRINGE) IMPLANT
SYSTEM SAHARA CHEST DRAIN ATS (WOUND CARE) ×5 IMPLANT
TAPE CLOTH SURG 4X10 WHT LF (GAUZE/BANDAGES/DRESSINGS) ×5 IMPLANT
TAPE PAPER 2X10 WHT MICROPORE (GAUZE/BANDAGES/DRESSINGS) ×5 IMPLANT
TOWEL OR 17X24 6PK STRL BLUE (TOWEL DISPOSABLE) ×5 IMPLANT
TOWEL OR 17X26 10 PK STRL BLUE (TOWEL DISPOSABLE) ×10 IMPLANT
TRAY FOLEY IC TEMP SENS 16FR (CATHETERS) ×5 IMPLANT
TUBE FEEDING 8FR 16IN STR KANG (MISCELLANEOUS) ×10 IMPLANT
TUBING INSUFFLATION (TUBING) ×5 IMPLANT
UNDERPAD 30X30 INCONTINENT (UNDERPADS AND DIAPERS) ×5 IMPLANT
WATER STERILE IRR 1000ML POUR (IV SOLUTION) ×10 IMPLANT

## 2015-02-16 NOTE — Interval H&P Note (Signed)
History and Physical Interval Note:  02/16/2015 8:11 AM  Isaac Ray  has presented today for surgery, with the diagnosis of CAD  The various methods of treatment have been discussed with the patient and family. After consideration of risks, benefits and other options for treatment, the patient has consented to  Procedure(s): CORONARY ARTERY BYPASS GRAFTING (CABG) (N/A) TRANSESOPHAGEAL ECHOCARDIOGRAM (TEE) (N/A) RADIAL ARTERY HARVEST (Left) as a surgical intervention .  The patient's history has been reviewed, patient examined, no change in status, stable for surgery.  I have reviewed the patient's chart and labs.  Questions were answered to the patient's satisfaction.     [name]

## 2015-02-16 NOTE — Brief Op Note (Addendum)
02/11/2015 - 02/16/2015  12:42 PM  PATIENT:  Isaac Ray  44 y.o. male  PRE-OPERATIVE DIAGNOSIS:  3 vessel CAD  POST-OPERATIVE DIAGNOSIS:  3 vessel CAD  PROCEDURE:   CORONARY ARTERY BYPASS GRAFTING x 5  LIMA to LAD  Sequential SVG to OM1 and OM2  Sequential LEFT RADIAL ARTERY to D2 and D1 ENDOSCOPIC GREATER SAPHENOUS VEIN HARVEST RIGHT THIGH LEFT RADIAL ARTERY HARVEST  SURGEON:  Surgeon(s): [name], MD  ASSISTANT: Coral Ceo, PA-C, Erin Barrett, PA-C  ANESTHESIA:   general  PATIENT CONDITION:  ICU - intubated and hemodynamically stable.  PRE-OPERATIVE WEIGHT:  72 kg  TEE- dilated LV global hypokinesis, EF 30%, trace MR  LV anterior wall scar LAD and diagonals fair quality vessels OMs poor quality vessels Good conduits  Off CPB with dopamine at 5 mcg/kg/min

## 2015-02-16 NOTE — Progress Notes (Deleted)
Unable to obtain ample amt for sputum specimen.  Respiratory therapy will continue to suction pt for specimen.

## 2015-02-16 NOTE — Anesthesia Postprocedure Evaluation (Signed)
  Anesthesia Post-op Note  Patient: Isaac Ray  Procedure(s) Performed: Procedure(s): CORONARY ARTERY BYPASS GRAFTING (CABG) x5 using left internal mammary artery, right thigh greater saphenous vein, and left radial artery.  (N/A) TRANSESOPHAGEAL ECHOCARDIOGRAM (TEE) (N/A) RADIAL ARTERY HARVEST (Left)  Patient Location: ICU  Anesthesia Type:General  Level of Consciousness: sedated and unresponsive  Airway and Oxygen Therapy: Patient remains intubated and on ventilator  Post-op Pain: none  Post-op Assessment: Post-op Vital signs reviewed, Patient's Cardiovascular Status Stable and Respiratory Function Stable  Post-op Vital Signs: Reviewed  Filed Vitals:   02/16/15 0447  BP: 130/72  Pulse: 49  Temp: 36.8 C  Resp: 18    Complications: No apparent anesthesia complications

## 2015-02-16 NOTE — Progress Notes (Signed)
Interpreter Wyvonnia Dusky pre-surgery for sedation Anesthesia Dr

## 2015-02-16 NOTE — Procedures (Signed)
Extubation Procedure Note  Patient Details:   Name: Isaac Ray DOB: June 17, 1971 MRN: [id number]   Airway Documentation:     Evaluation  O2 sats: stable throughout and currently acceptable Complications: No apparent complications Patient did tolerate procedure well. Bilateral Breath Sounds: Clear   Yes   Prior to extubation pt suctioned orally; NIF -38, VC 881.  Post-extubation:  Pt able to cough to produce sputum, state his name, no stridor noted.  Interpreter at bedside for communication assistance throughout extubation.  Antoine Poche 02/16/2015, 6:00 PM

## 2015-02-16 NOTE — Anesthesia Preprocedure Evaluation (Addendum)
Anesthesia Evaluation  Patient identified by MRN, date of birth, ID band Patient awake    Reviewed: Allergy & Precautions, H&P , NPO status , Patient's Chart, lab work & pertinent test results  Airway Mallampati: II  TM Distance: >3 FB Neck ROM: Full    Dental no notable dental hx. (+) Teeth Intact, Dental Advisory Given   Pulmonary Current Smoker,  breath sounds clear to auscultation  Pulmonary exam normal       Cardiovascular hypertension, Pt. on medications + CAD and + Past MI Rhythm:Regular Rate:Normal     Neuro/Psych negative neurological ROS  negative psych ROS   GI/Hepatic negative GI ROS, Neg liver ROS,   Endo/Other  diabetes, Type 2, Oral Hypoglycemic Agents  Renal/GU negative Renal ROS  negative genitourinary   Musculoskeletal   Abdominal   Peds  Hematology negative hematology ROS (+)   Anesthesia Other Findings   Reproductive/Obstetrics negative OB ROS                            Anesthesia Physical Anesthesia Plan  ASA: IV  Anesthesia Plan: General   Post-op Pain Management:    Induction: Intravenous  Airway Management Planned: Oral ETT  Additional Equipment: Arterial line, CVP, PA Cath, TEE and Ultrasound Guidance Line Placement  Intra-op Plan:   Post-operative Plan: Post-operative intubation/ventilation  Informed Consent: I have reviewed the patients History and Physical, chart, labs and discussed the procedure including the risks, benefits and alternatives for the proposed anesthesia with the patient or authorized representative who has indicated his/her understanding and acceptance.   Dental advisory given  Plan Discussed with: CRNA  Anesthesia Plan Comments:         Anesthesia Quick Evaluation

## 2015-02-16 NOTE — Progress Notes (Signed)
Pt transported off unit to OR. P. Amo Abdulmalik Darco RN 

## 2015-02-16 NOTE — Transfer of Care (Signed)
Immediate Anesthesia Transfer of Care Note  Patient: Isaac Ray  Procedure(s) Performed: Procedure(s): CORONARY ARTERY BYPASS GRAFTING (CABG) x5 using left internal mammary artery, right thigh greater saphenous vein, and left radial artery.  (N/A) TRANSESOPHAGEAL ECHOCARDIOGRAM (TEE) (N/A) RADIAL ARTERY HARVEST (Left)  Patient Location: SICU  Anesthesia Type:General  Level of Consciousness: sedated, unresponsive and Patient remains intubated per anesthesia plan  Airway & Oxygen Therapy: Patient remains intubated per anesthesia plan and Patient placed on Ventilator (see vital sign flow sheet for setting)  Post-op Assessment: Report given to RN and Post -op Vital signs reviewed and stable  Post vital signs: Reviewed and stable  Last Vitals:  Filed Vitals:   02/16/15 0447  BP: 130/72  Pulse: 49  Temp: 36.8 C  Resp: 18    Complications: No apparent anesthesia complications

## 2015-02-16 NOTE — Progress Notes (Signed)
Echocardiogram Echocardiogram Transesophageal has been performed.  Dorothey Baseman 02/16/2015, 11:59 AM

## 2015-02-16 NOTE — Progress Notes (Signed)
TCTS BRIEF SICU PROGRESS NOTE  Day of Surgery  S/P Procedure(s) (LRB): CORONARY ARTERY BYPASS GRAFTING (CABG) x5 using left internal mammary artery, right thigh greater saphenous vein, and left radial artery.  (N/A) TRANSESOPHAGEAL ECHOCARDIOGRAM (TEE) (N/A) RADIAL ARTERY HARVEST (Left)   Extubated uneventfully NSR - AAI paced w/ stable hemodynamics Chest tube output low UOP excellent Labs okay  Plan: Continue routine early postop  Purcell Nails 02/16/2015 7:17 PM

## 2015-02-16 NOTE — H&P (View-Only) (Signed)
Reason for Consult:3 vessel CAD, s/ NSTEMI Referring Physician: Dr. Charlotte Sanes Isaac Ray is an 44 y.o. male.  HPI: 44 yo man with a history of CAD presents with unstable chest pain.  I interviewed the patient with a Spanish language interpreter.  Isaac Ray is a 44 yo Hispanic male from [place]. He is in [place] visiting his daughter. He has a history of CAD with 2 prior MI, treated with PTCA and stenting. He has multiple risk factors including hypertension, hyperlipidemia, type II diabetes, a strong family history and ongoing tobacco abuse. He presented with a 24 hour history of chest pain and shortness of breath. He ttok nitroglycerin and had relief, but it would recur when the nitro wore off. His ECG showed non specific ST changes. He ruled in for a NQWMI.   At catheterization today he had severe 3 vessel CAD with occlusion of stents in his RCA and circumflex and significant stenoses in his LAD and a large diagonal. EF was 25%. He denies pain at present.   Past Medical History  Diagnosis Date  . Essential hypertension   . Type 2 diabetes mellitus   . Hyperlipidemia   . CAD (coronary artery disease)     Past Surgical History  Procedure Laterality Date  . Coronary angioplasty with stent placement  2013    Family History  Problem Relation Age of Onset  . Coronary artery disease Father     had CABG    Social History:  reports that he has been smoking.  He does not have any smokeless tobacco history on file. He reports that he does not drink alcohol or use illicit drugs.  Married  Allergies: No Known Allergies  Medications:  Prior to Admission:  Prescriptions prior to admission  Medication Sig Dispense Refill Last Dose  . aspirin 325 MG tablet Take 325 mg by mouth daily.   02/11/2015 at Unknown time  . isosorbide dinitrate (ISORDIL) 30 MG tablet Take 30 mg by mouth 4 (four) times daily.   02/11/2015 at Unknown time  . lisinopril (PRINIVIL,ZESTRIL) 5 MG tablet Take 5 mg  by mouth daily.   02/11/2015 at Unknown time  . metFORMIN (GLUCOPHAGE) 500 MG tablet Take 500 mg by mouth 2 (two) times daily with a meal.   02/11/2015 at Unknown time  . nitroGLYCERIN (NITROSTAT) 0.4 MG SL tablet Place 0.4 mg under the tongue every 5 (five) minutes as needed for chest pain.   unknown at [facility]    Results for orders placed or performed during the hospital encounter of 02/11/15 (from the past 48 hour(s))  Glucose, capillary     Status: Abnormal   Collection Time: 02/12/15  8:55 PM  Result Value Ref Range   Glucose-Capillary 236 (H) 70 - 99 mg/dL   Comment 1 Notify RN    Comment 2 Document in Chart   CBC     Status: None   Collection Time: 02/13/15  5:20 AM  Result Value Ref Range   WBC 8.7 4.0 - 10.5 K/uL   RBC 4.76 4.22 - 5.81 MIL/uL   Hemoglobin 14.6 13.0 - 17.0 g/dL   HCT 43.1 39.0 - 52.0 %   MCV 90.5 78.0 - 100.0 fL   MCH 30.7 26.0 - 34.0 pg   MCHC 33.9 30.0 - 36.0 g/dL   RDW 13.1 11.5 - 15.5 %   Platelets 175 150 - 400 K/uL  Hemoglobin A1c     Status: Abnormal   Collection Time: 02/13/15  5:20  AM  Result Value Ref Range   Hgb A1c MFr Bld 10.2 (H) 4.8 - 5.6 %    Comment: (NOTE)         Pre-diabetes: 5.7 - 6.4         Diabetes: >6.4         Glycemic control for adults with diabetes: <7.0    Mean Plasma Glucose 246 mg/dL    Comment: (NOTE) Performed At: [facility] [address] [place], [place] [id number] Lindon Romp MD GE:[id number]   Glucose, capillary     Status: Abnormal   Collection Time: 02/13/15  6:25 AM  Result Value Ref Range   Glucose-Capillary 207 (H) 70 - 99 mg/dL   Comment 1 Notify RN   Heparin level (unfractionated)     Status: Abnormal   Collection Time: 02/13/15 10:16 AM  Result Value Ref Range   Heparin Unfractionated 0.14 (L) 0.30 - 0.70 IU/mL    Comment:        IF HEPARIN RESULTS ARE BELOW EXPECTED VALUES, AND PATIENT DOSAGE HAS BEEN CONFIRMED, SUGGEST FOLLOW UP TESTING OF ANTITHROMBIN III LEVELS.   Glucose,  capillary     Status: Abnormal   Collection Time: 02/13/15 11:25 AM  Result Value Ref Range   Glucose-Capillary 337 (H) 70 - 99 mg/dL  Glucose, capillary     Status: Abnormal   Collection Time: 02/13/15  4:09 PM  Result Value Ref Range   Glucose-Capillary 148 (H) 70 - 99 mg/dL  Heparin level (unfractionated)     Status: None   Collection Time: 02/13/15  6:48 PM  Result Value Ref Range   Heparin Unfractionated 0.38 0.30 - 0.70 IU/mL    Comment:        IF HEPARIN RESULTS ARE BELOW EXPECTED VALUES, AND PATIENT DOSAGE HAS BEEN CONFIRMED, SUGGEST FOLLOW UP TESTING OF ANTITHROMBIN III LEVELS.   Glucose, capillary     Status: Abnormal   Collection Time: 02/13/15 10:10 PM  Result Value Ref Range   Glucose-Capillary 258 (H) 70 - 99 mg/dL   Comment 1 Notify RN    Comment 2 Document in Chart   Basic metabolic panel     Status: Abnormal   Collection Time: 02/14/15  3:14 AM  Result Value Ref Range   Sodium 139 135 - 145 mmol/L   Potassium 3.9 3.5 - 5.1 mmol/L   Chloride 106 96 - 112 mmol/L   CO2 25 19 - 32 mmol/L   Glucose, Bld 192 (H) 70 - 99 mg/dL   BUN 9 6 - 23 mg/dL   Creatinine, Ser 1.06 0.50 - 1.35 mg/dL   Calcium 8.7 8.4 - 10.5 mg/dL   GFR calc non Af Amer 83 (L) >90 mL/min   GFR calc Af Amer >90 >90 mL/min    Comment: (NOTE) The eGFR has been calculated using the CKD EPI equation. This calculation has not been validated in all clinical situations. eGFR's persistently <90 mL/min signify possible Chronic Kidney Disease.    Anion gap 8 5 - 15  CBC     Status: None   Collection Time: 02/14/15  3:14 AM  Result Value Ref Range   WBC 8.7 4.0 - 10.5 K/uL   RBC 4.50 4.22 - 5.81 MIL/uL   Hemoglobin 13.9 13.0 - 17.0 g/dL   HCT 41.0 39.0 - 52.0 %   MCV 91.1 78.0 - 100.0 fL   MCH 30.9 26.0 - 34.0 pg   MCHC 33.9 30.0 - 36.0 g/dL   RDW 13.2 11.5 -  15.5 %   Platelets 174 150 - 400 K/uL  Heparin level (unfractionated)     Status: None   Collection Time: 02/14/15  3:14 AM  Result  Value Ref Range   Heparin Unfractionated 0.44 0.30 - 0.70 IU/mL    Comment:        IF HEPARIN RESULTS ARE BELOW EXPECTED VALUES, AND PATIENT DOSAGE HAS BEEN CONFIRMED, SUGGEST FOLLOW UP TESTING OF ANTITHROMBIN III LEVELS.   Glucose, capillary     Status: Abnormal   Collection Time: 02/14/15  6:08 AM  Result Value Ref Range   Glucose-Capillary 184 (H) 70 - 99 mg/dL   Comment 1 Notify RN    Comment 2 Document in Chart   Glucose, capillary     Status: Abnormal   Collection Time: 02/14/15 11:35 AM  Result Value Ref Range   Glucose-Capillary 131 (H) 70 - 99 mg/dL   Comment 1 Notify RN     No results found.  Review of Systems  Constitutional: Positive for malaise/fatigue. Negative for fever.  Respiratory: Positive for shortness of breath. Negative for cough.   Cardiovascular: Positive for chest pain. Negative for claudication and leg swelling.  Neurological: Negative.   Endo/Heme/Allergies: Does not bruise/bleed easily.  All other systems reviewed and are negative.  Blood pressure 113/63, pulse 51, temperature 98 F (36.7 C), temperature source Oral, resp. rate 18, height _0  (1.702 m), weight 159 lb 4.8 oz (72.258 kg), SpO2 95 %. Physical Exam  Vitals reviewed. Constitutional: He is oriented to person, place, and time. He appears well-developed and well-nourished. No distress.  HENT:  Head: Normocephalic and atraumatic.  Eyes: EOM are normal.  Neck: Neck supple. No thyromegaly present.  Cardiovascular: Normal rate, regular rhythm and intact distal pulses.  Exam reveals gallop (+S4).   No murmur heard. Normal Allen's test on left  Respiratory: Effort normal and breath sounds normal. He has no wheezes. He has no rales.  GI: Soft. There is no tenderness.  Musculoskeletal: He exhibits no edema.  Lymphadenopathy:    He has no cervical adenopathy.  Neurological: He is alert and oriented to person, place, and time. No cranial nerve deficit.  No focal motor deficit  Skin: Skin  is warm and dry.   HEMODYNAMICS:   AO SYSTOLIC/AO DIASTOLIC: 045/40  LV SYSTOLIC/LV DIASTOLIC: 981/19  ANGIOGRAPHIC RESULTS:   1. Left main; normal  2. LAD; 80% proximal and mid and mid to distal with a diffusely diseased moderate-sized diagonal branch 3. Left circumflex; codominant with occluded stents in the mid AV groove and left. PDA.  4. Right coronary artery; codominant with an occluded stent in the midportion 5. Left ventriculography; RAO left ventriculogram was performed using  25 mL of Visipaque dye at 12 mL/second. The overall LVEF estimated  25-30 % With wall motion abnormalities notable for severe anteroapical hypokinesia and moderate inferobasal hypokinesia  IMPRESSION:Isaac Ray has 3 vessel disease with severe LV dysfunction. Both stented arteries are occluded and he has got diffuse LAD and diagonal branch disease. He will need coronary artery bypass grafting. TCTS has been notified. The sheath was removed and a TR band was placed on the right wrist to achieve patent hemostasis. The patient left the lab in stable condition. I will restart heparin 3 hours after TR band removal.  Lorretta Harp MD, [facility] 02/14/2015 12:56 PM  Assessment/Plan: 44 yo man with multiple CRF and a history of known CAD presents with unstable chest pain and ruled in for a NSTEMI. At catheterization he has  occluded stents in the circumflex and RCA. He has significant LAD and diagonal disease. CABG is indicated for survival benefit adn relief of symptoms.  Given his young age he may benefit from a radial artery graft and his Allen's test appears normal. Will plan to use left radial in addition to LIMA and saphenous vein.  I discussed the general nature of the procedure, the need for general anesthesia, and the incisions to be used with Mr and Mrs Isaac Ray. I discussed the expected hospital stay, overall recovery and short and long term outcomes. I reviewed the indications, risks, benefits adn  alternatives. They understand the risks include, but are not limited to death, stroke, MI, DVT/PE, bleeding, possible need for transfusion, infections, cardiac arrhythmias, and other organ system dysfunction including respiratory, renal, or GI complications. They understand and accept these risks and agree to proceed.  Plan CABG Wednesday 4/20  Melrose Nakayama 02/14/2015, 4:53 PM

## 2015-02-16 NOTE — Anesthesia Procedure Notes (Addendum)
Procedure Name: Intubation Date/Time: 02/16/2015 8:43 AM Performed by: Jerilee Hoh Pre-anesthesia Checklist: Patient identified, Emergency Drugs available, Suction available and Patient being monitored Patient Re-evaluated:Patient Re-evaluated prior to inductionOxygen Delivery Method: Circle system utilized Preoxygenation: Pre-oxygenation with 100% oxygen Intubation Type: IV induction Ventilation: Mask ventilation without difficulty Laryngoscope Size: Mac and 3 Grade View: Grade I Tube type: Oral Tube size: 8.0 mm Number of attempts: 1 Airway Equipment and Method: Stylet Placement Confirmation: ETT inserted through vocal cords under direct vision,  positive ETCO2,  CO2 detector and breath sounds checked- equal and bilateral Secured at: 22 cm Tube secured with: Tape Dental Injury: Teeth and Oropharynx as per pre-operative assessment       CVP/PAC

## 2015-02-17 ENCOUNTER — Inpatient Hospital Stay (HOSPITAL_COMMUNITY): Payer: Medicaid Other

## 2015-02-17 LAB — MAGNESIUM
MAGNESIUM: 2 mg/dL (ref 1.5–2.5)
Magnesium: 1.8 mg/dL (ref 1.5–2.5)

## 2015-02-17 LAB — CBC
HCT: 41.2 % (ref 39.0–52.0)
HEMATOCRIT: 40.5 % (ref 39.0–52.0)
HEMOGLOBIN: 14.6 g/dL (ref 13.0–17.0)
Hemoglobin: 14 g/dL (ref 13.0–17.0)
MCH: 31.2 pg (ref 26.0–34.0)
MCH: 31.8 pg (ref 26.0–34.0)
MCHC: 34.6 g/dL (ref 30.0–36.0)
MCHC: 35.4 g/dL (ref 30.0–36.0)
MCV: 90.2 fL (ref 78.0–100.0)
MCV: 89.8 fL (ref 78.0–100.0)
PLATELETS: 80 10*3/uL — AB (ref 150–400)
Platelets: 82 10*3/uL — ABNORMAL LOW (ref 150–400)
RBC: 4.49 MIL/uL (ref 4.22–5.81)
RBC: 4.59 MIL/uL (ref 4.22–5.81)
RDW: 13.2 % (ref 11.5–15.5)
RDW: 13.3 % (ref 11.5–15.5)
WBC: 16.4 10*3/uL — AB (ref 4.0–10.5)
WBC: 18.9 10*3/uL — AB (ref 4.0–10.5)

## 2015-02-17 LAB — BASIC METABOLIC PANEL
Anion gap: 7 (ref 5–15)
BUN: 10 mg/dL (ref 6–23)
CALCIUM: 8.1 mg/dL — AB (ref 8.4–10.5)
CO2: 25 mmol/L (ref 19–32)
CREATININE: 0.82 mg/dL (ref 0.50–1.35)
Chloride: 107 mmol/L (ref 96–112)
GFR calc Af Amer: >90 mL/min (ref >90)
GLUCOSE: 101 mg/dL — AB (ref 70–99)
Potassium: 3.8 mmol/L (ref 3.5–5.1)
SODIUM: 139 mmol/L (ref 135–145)

## 2015-02-17 LAB — GLUCOSE, CAPILLARY
GLUCOSE-CAPILLARY: 107 mg/dL — AB (ref 70–99)
GLUCOSE-CAPILLARY: 107 mg/dL — AB (ref 70–99)
GLUCOSE-CAPILLARY: 107 mg/dL — AB (ref 70–99)
GLUCOSE-CAPILLARY: 109 mg/dL — AB (ref 70–99)
GLUCOSE-CAPILLARY: 120 mg/dL — AB (ref 70–99)
GLUCOSE-CAPILLARY: 121 mg/dL — AB (ref 70–99)
GLUCOSE-CAPILLARY: 122 mg/dL — AB (ref 70–99)
Glucose-Capillary: 101 mg/dL — ABNORMAL HIGH (ref 70–99)
Glucose-Capillary: 102 mg/dL — ABNORMAL HIGH (ref 70–99)
Glucose-Capillary: 105 mg/dL — ABNORMAL HIGH (ref 70–99)
Glucose-Capillary: 106 mg/dL — ABNORMAL HIGH (ref 70–99)
Glucose-Capillary: 111 mg/dL — ABNORMAL HIGH (ref 70–99)
Glucose-Capillary: 113 mg/dL — ABNORMAL HIGH (ref 70–99)
Glucose-Capillary: 156 mg/dL — ABNORMAL HIGH (ref 70–99)
Glucose-Capillary: 94 mg/dL (ref 70–99)
Glucose-Capillary: 95 mg/dL (ref 70–99)
Glucose-Capillary: 99 mg/dL (ref 70–99)

## 2015-02-17 LAB — POCT I-STAT, CHEM 8
BUN: 13 mg/dL (ref 6–23)
CALCIUM ION: 1.19 mmol/L (ref 1.12–1.23)
CHLORIDE: 103 mmol/L (ref 96–112)
Creatinine, Ser: 0.9 mg/dL (ref 0.50–1.35)
Glucose, Bld: 117 mg/dL — ABNORMAL HIGH (ref 70–99)
HEMATOCRIT: 45 % (ref 39.0–52.0)
Hemoglobin: 15.3 g/dL (ref 13.0–17.0)
POTASSIUM: 4.2 mmol/L (ref 3.5–5.1)
Sodium: 137 mmol/L (ref 135–145)
TCO2: 19 mmol/L (ref 0–100)

## 2015-02-17 LAB — CREATININE, SERUM
Creatinine, Ser: 0.98 mg/dL (ref 0.50–1.35)
GFR calc Af Amer: >90 mL/min (ref >90)

## 2015-02-17 MED ORDER — INSULIN ASPART 100 UNIT/ML ~~LOC~~ SOLN
0.0000 [IU] | SUBCUTANEOUS | Status: DC
  Administered 2015-02-17 – 2015-02-18 (×3): 2 [IU] via SUBCUTANEOUS
  Administered 2015-02-18: 4 [IU] via SUBCUTANEOUS
  Administered 2015-02-18: 2 [IU] via SUBCUTANEOUS

## 2015-02-17 MED ORDER — POTASSIUM CHLORIDE 10 MEQ/50ML IV SOLN
10.0000 meq | INTRAVENOUS | Status: AC
  Administered 2015-02-17 (×4): 10 meq via INTRAVENOUS

## 2015-02-17 MED ORDER — INSULIN ASPART 100 UNIT/ML ~~LOC~~ SOLN: 4.0000 [IU] | Freq: Three times a day (TID) | SUBCUTANEOUS | Status: DC

## 2015-02-17 MED ORDER — ISOSORBIDE MONONITRATE ER 30 MG PO TB24
30.0000 mg | ORAL_TABLET | Freq: Every day | ORAL | Status: DC
  Administered 2015-02-17 – 2015-02-20 (×4): 30 mg via ORAL
  Filled 2015-02-17 (×4): qty 1

## 2015-02-17 MED ORDER — INSULIN DETEMIR 100 UNIT/ML ~~LOC~~ SOLN
30.0000 [IU] | Freq: Once | SUBCUTANEOUS | Status: AC
  Administered 2015-02-17: 30 [IU] via SUBCUTANEOUS
  Filled 2015-02-17: qty 0.3

## 2015-02-17 MED ORDER — INSULIN DETEMIR 100 UNIT/ML ~~LOC~~ SOLN
30.0000 [IU] | Freq: Every day | SUBCUTANEOUS | Status: DC
  Administered 2015-02-18 – 2015-02-20 (×3): 30 [IU] via SUBCUTANEOUS
  Filled 2015-02-17 (×3): qty 0.3

## 2015-02-17 MED ORDER — FUROSEMIDE 10 MG/ML IJ SOLN
20.0000 mg | Freq: Once | INTRAMUSCULAR | Status: AC
  Administered 2015-02-17: 20 mg via INTRAVENOUS

## 2015-02-17 MED FILL — Magnesium Sulfate Inj 50%: INTRAMUSCULAR | Qty: 10 | Status: AC

## 2015-02-17 MED FILL — Potassium Chloride Inj 2 mEq/ML: INTRAVENOUS | Qty: 40 | Status: AC

## 2015-02-17 MED FILL — Heparin Sodium (Porcine) Inj 1000 Unit/ML: INTRAMUSCULAR | Qty: 30 | Status: AC

## 2015-02-17 NOTE — Progress Notes (Signed)
Patient ID: Isaac Ray, male   DOB: 1971/10/28, 44 y.o.   MRN: [id number] EVENING ROUNDS NOTE :     [address]       [place] [address]             [phone]                 1 Day Post-Op Procedure(s) (LRB): CORONARY ARTERY BYPASS GRAFTING (CABG) x5 using left internal mammary artery, right thigh greater saphenous vein, and left radial artery.  (N/A) TRANSESOPHAGEAL ECHOCARDIOGRAM (TEE) (N/A) RADIAL ARTERY HARVEST (Left)  Total Length of Stay:  LOS: 5 days  BP 127/76 mmHg  Pulse 78  Temp(Src) 98.1 F (36.7 C) (Oral)  Resp 30  Ht 5\' 7"  (1.702 m)  Wt 196 lb 3.4 oz (89 kg)  BMI 30.72 kg/m2  SpO2 93%  .Intake/Output      04/21 0701 - 04/22 0700   P.O. 120   I.V. (mL/kg) 204.5 (2.3)   Blood    IV Piggyback 250   Total Intake(mL/kg) 574.5 (6.5)   Urine (mL/kg/hr) 985 (0.9)   Blood    Chest Tube 40 (0)   Total Output 1025   Net -450.6         . sodium chloride    . sodium chloride    . sodium chloride 10 mL/hr at 02/16/15 1500  . dexmedetomidine Stopped (02/16/15 1715)  . DOPamine Stopped (02/17/15 1000)  . insulin (NOVOLIN-R) infusion Stopped (02/17/15 1400)  . lactated ringers 10 mL/hr at 02/16/15 1500  . lactated ringers 10 mL/hr at 02/16/15 1500  . phenylephrine (NEO-SYNEPHRINE) Adult infusion Stopped (02/17/15 0500)     Lab Results  Component Value Date   WBC 18.9* 02/17/2015   HGB 14.6 02/17/2015   HCT 41.2 02/17/2015   PLT 80* 02/17/2015   GLUCOSE 117* 02/17/2015   CHOL 158 02/12/2015   TRIG 172* 02/12/2015   HDL 26* 02/12/2015   LDLCALC 98 02/12/2015   ALT 74* 02/15/2015   AST 41* 02/15/2015   NA 137 02/17/2015   K 4.2 02/17/2015   CL 103 02/17/2015   CREATININE 0.98 02/17/2015   BUN 13 02/17/2015   CO2 25 02/17/2015   TSH 1.956 02/12/2015   INR 1.24 02/16/2015   HGBA1C 10.2* 02/13/2015   Stable, walked around the unit tonight  Delight Ovens MD  Beeper [phone] Office [phone] 02/17/2015 7:36 PM

## 2015-02-17 NOTE — Op Note (Signed)
NAMEALEXANDR, LUPIA NO.:  [id number]  MEDICAL RECORD NO.:  [id number]  LOCATION:  2S15C                        FACILITY:  [facility]  PHYSICIAN:  Salvatore Decent. Dorris Fetch, M.D.DATE OF BIRTH:  1971/02/11  DATE OF PROCEDURE:  02/16/2015 DATE OF DISCHARGE:                              OPERATIVE REPORT   PREOPERATIVE DIAGNOSIS:  Severe three-vessel coronary artery disease status post non-ST-elevation myocardial infarction.  POSTOPERATIVE DIAGNOSIS:  Severe three-vessel coronary artery disease status post non-ST-elevation myocardial infarction.  PROCEDURE:  Median sternotomy Coronary artery bypass grafting x 5  Left internal mammary artery to left anterior descending,  Sequential left radial artery to second diagonal and first diagonal,  Sequential saphenous vein graft to obtuse marginals 1 and 2 Endoscopic vein harvest right thigh Open left radial artery harvest.  SURGEON:  Salvatore Decent. Dorris Fetch, MD  ASSISTANT: 1. Coral Ceo, P.A. 2. Lowella Dandy, P.A.  ANESTHESIA:  General.  FINDINGS:  Transesophageal echocardiography revealed dilated left ventricle with ejection fraction approximately 30%.  Mild mitral regurgitation.  Anterior wall scar.  LAD and diagonal fair quality target vessels.  OM 1 and 2 poor quality target vessels. Good quality conduits.    Postbypass transesophageal echocardiography unchanged.  CLINICAL NOTE:  Mr. Isaac Ray is a 44 year old Hispanic male visiting [place] from [place], [place].  He has a history of coronary artery disease with 2 prior myocardial infarctions treated with PTCA and stenting.  He presented with a 24-hour history of chest pain and shortness of breath.  He ruled in for a non-ST-elevation MI.  At catheterization, he had severe 3-vessel coronary artery disease with totally occlusion of the right coronary and circumflex and significant stenosis in his LAD as well as a large bifurcating diagonal branch.  Ejection fraction  was 25%.  He was advised to undergo coronary artery bypass grafting for survival benefit and relief of symptoms.  The indications, risks, benefits, and alternatives were discussed in detail with the patient.  He understood and accepted the risks and agreed to proceed.  OPERATIVE NOTE:  Mr. Isaac Ray was brought to the preoperative holding area on February 16, 2015.  Anesthesia placed a Swan-Ganz catheter and an arterial blood pressure monitoring line.  He was taken to the operating room, anesthetized, and intubated.  Intravenous antibiotics were administered.  Transesophageal echocardiography was performed.  Findings as noted.  He had severe left ventricular dysfunction.  There was anterior and apical akinesis.  There was mild mitral regurgitation.  A Foley catheter was placed.  The chest, abdomen, legs and left arm were prepped and draped in usual sterile fashion.  The conduits were harvested simultaneously.  The left radial was harvested in an open fashion using a Harmonic Scalpel.  Simultaneously, the right greater saphenous vein was harvested from the thigh Endoscopically. A median sternotomy was performed, and the left internal mammary artery was harvested under direct vision.  2000 units of heparin was administered during the vessel harvest.  The remainder of the full heparin dose was given prior to opening the pericardium.  All of the conduits were of good quality.  After harvesting the radial artery and saphenous vein, those incisions were closed.  The left arm then was  tucked back to the patient's side. The full heparin dose was given.  The pericardium was opened.  The ascending aorta was relatively small for the patient's size.  There was no atherosclerotic disease.  The aorta was cannulated via concentric 2-0 Ethibond pledgeted pursestring sutures.  A dual-stage venous cannula was placed via a pursestring suture in the right atrial appendage. Cardiopulmonary bypass was begun.   Flows were maintained per protocol. The patient was cooled to 32 degrees Celsius.  The coronary arteries were inspected and anastomotic sites were chosen.  The posterior descending branch was too small and heavily diseased to graft.  OM 1 and 2 did appear to be graftable although they were both diffusely diseased vessels.  The LAD was superficially intramyocardial and was not dissected out at this time.  The diagonal branches did appear to be graftable.  The conduits were inspected and cut to length.  A foam pad was placed in the pericardium to insulate the heart.  A temperature probe was placed in myocardial septum, and a cardioplegia cannula was placed in the ascending aorta.  The aorta was crossclamped.  The left ventricle was emptied via the aortic root vent.  Cardiac arrest then was achieved with a combination of cold antegrade blood cardioplegia and topical iced saline.  1 L of cardioplegia was administered.  There was a rapid diastolic arrest and septal cooling to 10 degrees Celsius.  The heart was elevated exposing the lateral wall.  OM 1 and 2 were identified.  An arteriotomy was made in OM 1, this vessel was a small poor quality target vessel.  A side-to-side anastomosis was performed with a running 7-0 Prolene suture.  A 1-mm probe did pass distally.  At the completion of anastomosis, there was an acceptable flow through this anastomosis.  The distal end of the vein then was beveled.  It was anastomosed end-to- side to OM 2.  OM 2 was a long posterolateral branch.  It was relatively good size but was heavily diseased and poor quality. When the arteriotomy was made, only a 1-mm probe would pass into the distal branches of the vessel.  The probe would not pass proximally due to the heavy calcific plaque.  The distal end of the vein then was anastomosed to OM 2 with a running 7-0 Prolene suture in an end-to-side fashion.  Cardioplegia was administered down the vein graft.  Flow  was as expected.  There was good hemostasis.  After giving additional cardioplegia down the aortic root, the heart was elevated exposing the anterior wall.  There was a large anteroapical scar.  The diagonal branch itself bifurcated and there was a medial and lateral branch, and a longitudinal arteriotomy was made in the radial artery at the site where it would cross over the more lateral second diagonal branch.  This vessel accepted to 1.5-mm probe and was of fair quality, it was thin walled.  A side-to-side anastomosis was performed with the radial artery to the second diagonal with a running 8-0 Prolene suture. A probe passed easily in all directions at the completion of the anastomosis.  The distal end was beveled and then anastomosed end-to- side to the first diagonal, which was the more medial branch closer to the LAD.  This vessel was larger and a better quality than the second diagonal, was a 1.5 mm in diameter.  The end-to-side anastomosis was performed with a running 8-0 Prolene suture and again a probe passed easily proximally and distally at the completion of  the anastomosis.  Additional cardioplegia was administered down the aortic root.  There was good backbleeding from the radial artery.  Next, the LAD was exposed.  It was superficially intramyocardial.  An arteriotomy was made.  This vessel did accept a 1.5-mm probe proximally and distally for approximately 4 cm then towards the apex the LAD narrowed, but a 1-mm probe did pass to the apex.  The left mammary was brought through a window in the pericardium.  The distal end was beveled.  It was a 2-mm good quality conduit.  It was anastomosed end-to- side with a running 8-0 Prolene suture.  At the completion of the mammary to LAD anastomosis, the bulldog clamp was removed.  Rapid septal rewarming was noted.  The bulldog clamp was replaced.  The mammary pedicle was tacked to the epicardial surface of the heart with  6-0 Prolene sutures.  Additional cardioplegia was administered.  The cardioplegia cannula was removed from the ascending aorta.  The vein graft was cut to length. The proximal vein graft anastomosis was performed to a 4.5-mm punch aortotomy with a running 6-0 Prolene suture.  At the completion of this anastomosis, the patient was placed in Trendelenburg position. Lidocaine was administered.  The aortic root was de-aired and the aortic crossclamp was removed.  The bulldog clamp was again removed from the left mammary artery.  The total crossclamp time was 98 minutes.  The patient initially fibrillated, but resumed sinus rhythm spontaneously without defibrillation.  Bulldog clamps were placed proximally and distally on the vein graft, and a longitudinal venotomy was made.  The proximal end of the radial artery was beveled and was anastomosed end-to-side to the vein graft with a running 7-0 Prolene suture.  This anastomosis was de-aired from the distal end prior to reestablishing flow proximally.  While rewarming was completed, all proximal and distal anastomoses were inspected for hemostasis.  A dopamine infusion was initiated at 5 mcg/kg/minute.  When the patient had rewarmed to a core temperature of 37 degrees Celsius, he was weaned from cardiopulmonary bypass on the first attempt without difficulty.  The total bypass time was 161 minutes.  The postbypass transesophageal echocardiography was unchanged from the prebypass study.  The initial cardiac index was greater than 3 L/minute/m2.  The patient remained hemodynamically stable throughout the postbypass period.  A test dose of protamine was administered and was well tolerated.  The atrial and aortic cannulae were removed.  The remainder of the protamine was administered without incident.  The chest was irrigated with warm saline.  Hemostasis was achieved.  The pericardium was reapproximated over the aorta and base of the heart with  interrupted 3-0 silk sutures. The left pleural and mediastinal chest tubes were placed through a separate subcostal incisions and secured with #1 silk sutures.  The sternum was closed with a combination of single and double heavy gauge stainless steel wires.  The pectoralis fascia, subcutaneous tissue, and skin were closed in standard fashion.  All sponge, needle, and instrument counts were correct at the end of the procedure.  The patient was taken from the operating room to the surgical intensive care unit in good condition.     Salvatore Decent Dorris Fetch, M.D.     SCH/MEDQ  D:  02/16/2015  T:  02/17/2015  Job:  [id number]

## 2015-02-17 NOTE — Progress Notes (Signed)
1 Day Post-Op Procedure(s) (LRB): CORONARY ARTERY BYPASS GRAFTING (CABG) x5 using left internal mammary artery, right thigh greater saphenous vein, and left radial artery.  (N/A) TRANSESOPHAGEAL ECHOCARDIOGRAM (TEE) (N/A) RADIAL ARTERY HARVEST (Left) Subjective: Dolor  Objective: Vital signs in last 24 hours: Temp:  [98.1 F (36.7 C)-100.4 F (38 C)] 100 F (37.8 C) (04/21 0700) Pulse Rate:  [64-80] 80 (04/21 0700) Cardiac Rhythm:  [-] Atrial paced (04/21 0400) Resp:  [13-27] 27 (04/21 0700) BP: (90-130)/(60-89) 129/64 mmHg (04/21 0700) SpO2:  [94 %-100 %] 95 % (04/21 0700) Arterial Line BP: (89-138)/(53-77) 108/57 mmHg (04/21 0700) FiO2 (%):  [40 %-50 %] 40 % (04/20 1725) Weight:  [196 lb 3.4 oz (89 kg)] 196 lb 3.4 oz (89 kg) (04/21 0600)  Hemodynamic parameters for last 24 hours: PAP: (20-32)/(4-23) 27/7 mmHg CO:  [3.4 L/min-6.3 L/min] 6.3 L/min CI:  [1.7 L/min/m2-3.2 L/min/m2] 3.2 L/min/m2  Intake/Output from previous day: 04/20 0701 - 04/21 0700 In: 4073.8 [I.V.:2873.8; Blood:800; IV Piggyback:400] Out: 4455 [Urine:2580; Blood:1625; Chest Tube:250] Intake/Output this shift:    General appearance: alert and no distress Neurologic: intact Heart: regular rate and rhythm and + rub Lungs: diminished breath sounds bibasilar Abdomen: normal findings: soft, non-tender  Lab Results:  Recent Labs  02/16/15 2150 02/16/15 2158 02/17/15 0500  WBC 20.2*  --  16.4*  HGB 15.4 16.0 14.0  HCT 44.7 47.0 40.5  PLT 99*  --  82*   BMET:  Recent Labs  02/15/15 1313  02/16/15 2158 02/17/15 0500  NA 137  < > 140 139  K 3.6  < > 3.9 3.8  CL 103  < > 105 107  CO2 25  --   --  25  GLUCOSE 236*  < > 151* 101*  BUN 11  < > 12 10  CREATININE 1.02  < > 0.80 0.82  CALCIUM 9.1  --   --  8.1*  < > = values in this interval not displayed.  PT/INR:  Recent Labs  02/16/15 2210  LABPROT 15.7*  INR 1.24   ABG    Component Value Date/Time   PHART 7.316* 02/16/2015 1843   HCO3  23.4 02/16/2015 1843   TCO2 20 02/16/2015 2158   ACIDBASEDEF 3.0* 02/16/2015 1843   O2SAT 94.0 02/16/2015 1843   CBG (last 3)   Recent Labs  02/17/15 0458 02/17/15 0608 02/17/15 0702  GLUCAP 105* 99 113*    Assessment/Plan: S/P Procedure(s) (LRB): CORONARY ARTERY BYPASS GRAFTING (CABG) x5 using left internal mammary artery, right thigh greater saphenous vein, and left radial artery.  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

## 2015-02-18 ENCOUNTER — Encounter (HOSPITAL_COMMUNITY): Payer: Self-pay | Admitting: Thoracic Surgery (Cardiothoracic Vascular Surgery)

## 2015-02-18 ENCOUNTER — Inpatient Hospital Stay (HOSPITAL_COMMUNITY): Payer: Medicaid Other

## 2015-02-18 LAB — CBC
HEMATOCRIT: 36.9 % — AB (ref 39.0–52.0)
HEMOGLOBIN: 12.6 g/dL — AB (ref 13.0–17.0)
MCH: 30.9 pg (ref 26.0–34.0)
MCHC: 34.1 g/dL (ref 30.0–36.0)
MCV: 90.4 fL (ref 78.0–100.0)
Platelets: 84 10*3/uL — ABNORMAL LOW (ref 150–400)
RBC: 4.08 MIL/uL — ABNORMAL LOW (ref 4.22–5.81)
RDW: 13.2 % (ref 11.5–15.5)
WBC: 16.6 10*3/uL — AB (ref 4.0–10.5)

## 2015-02-18 LAB — BASIC METABOLIC PANEL
Anion gap: 9 (ref 5–15)
BUN: 14 mg/dL (ref 6–23)
CHLORIDE: 100 mmol/L (ref 96–112)
CO2: 26 mmol/L (ref 19–32)
CREATININE: 1.02 mg/dL (ref 0.50–1.35)
Calcium: 8.1 mg/dL — ABNORMAL LOW (ref 8.4–10.5)
GFR calc Af Amer: >90 mL/min (ref >90)
GFR, EST NON AFRICAN AMERICAN: 87 mL/min — AB (ref >90)
Glucose, Bld: 140 mg/dL — ABNORMAL HIGH (ref 70–99)
Potassium: 4 mmol/L (ref 3.5–5.1)
Sodium: 135 mmol/L (ref 135–145)

## 2015-02-18 LAB — GLUCOSE, CAPILLARY
GLUCOSE-CAPILLARY: 152 mg/dL — AB (ref 70–99)
GLUCOSE-CAPILLARY: 171 mg/dL — AB (ref 70–99)
GLUCOSE-CAPILLARY: 142 mg/dL — AB (ref 70–99)
Glucose-Capillary: 134 mg/dL — ABNORMAL HIGH (ref 70–99)
Glucose-Capillary: 147 mg/dL — ABNORMAL HIGH (ref 70–99)
Glucose-Capillary: 175 mg/dL — ABNORMAL HIGH (ref 70–99)

## 2015-02-18 MED ORDER — FUROSEMIDE 40 MG PO TABS
40.0000 mg | ORAL_TABLET | Freq: Every day | ORAL | Status: DC
  Administered 2015-02-18 – 2015-02-20 (×3): 40 mg via ORAL
  Filled 2015-02-18 (×3): qty 1

## 2015-02-18 MED ORDER — INSULIN ASPART 100 UNIT/ML ~~LOC~~ SOLN
0.0000 [IU] | Freq: Three times a day (TID) | SUBCUTANEOUS | Status: DC
  Administered 2015-02-18: 2 [IU] via SUBCUTANEOUS
  Administered 2015-02-18 – 2015-02-19 (×3): 3 [IU] via SUBCUTANEOUS
  Administered 2015-02-20: 2 [IU] via SUBCUTANEOUS

## 2015-02-18 MED ORDER — INSULIN ASPART 100 UNIT/ML ~~LOC~~ SOLN
0.0000 [IU] | Freq: Every day | SUBCUTANEOUS | Status: DC
Start: 2015-02-18 — End: 2015-02-20

## 2015-02-18 MED ORDER — POTASSIUM CHLORIDE CRYS ER 20 MEQ PO TBCR
20.0000 meq | EXTENDED_RELEASE_TABLET | Freq: Two times a day (BID) | ORAL | Status: DC
  Administered 2015-02-18 – 2015-02-20 (×5): 20 meq via ORAL
  Filled 2015-02-18 (×6): qty 1

## 2015-02-18 MED ORDER — MOVING RIGHT ALONG BOOK
Freq: Once | Status: DC
  Filled 2015-02-18: qty 1

## 2015-02-18 MED ORDER — LISINOPRIL 5 MG PO TABS
5.0000 mg | ORAL_TABLET | Freq: Every day | ORAL | Status: DC
  Administered 2015-02-19 – 2015-02-20 (×2): 5 mg via ORAL
  Filled 2015-02-18 (×2): qty 1

## 2015-02-18 MED ORDER — SODIUM CHLORIDE 0.9 % IV SOLN: 250.0000 mL | INTRAVENOUS | Status: DC | PRN

## 2015-02-18 MED ORDER — SODIUM CHLORIDE 0.9 % IJ SOLN: 3.0000 mL | INTRAMUSCULAR | Status: DC | PRN

## 2015-02-18 MED ORDER — ALUM & MAG HYDROXIDE-SIMETH 200-200-20 MG/5ML PO SUSP: 15.0000 mL | ORAL | Status: DC | PRN

## 2015-02-18 MED ORDER — METFORMIN HCL 500 MG PO TABS
500.0000 mg | ORAL_TABLET | Freq: Two times a day (BID) | ORAL | Status: DC
  Administered 2015-02-18 – 2015-02-20 (×4): 500 mg via ORAL
  Filled 2015-02-18 (×6): qty 1

## 2015-02-18 MED ORDER — ZOLPIDEM TARTRATE 5 MG PO TABS: 5.0000 mg | ORAL_TABLET | Freq: Every evening | ORAL | Status: DC | PRN

## 2015-02-18 MED ORDER — SODIUM CHLORIDE 0.9 % IJ SOLN
3.0000 mL | Freq: Two times a day (BID) | INTRAMUSCULAR | Status: DC
  Administered 2015-02-18 – 2015-02-20 (×5): 3 mL via INTRAVENOUS

## 2015-02-18 MED ORDER — MAGNESIUM HYDROXIDE 400 MG/5ML PO SUSP: 30.0000 mL | Freq: Every day | ORAL | Status: DC | PRN

## 2015-02-18 NOTE — Progress Notes (Signed)
Patient arrived to the unit. Patient is spanish speaking, Marsh Dolly used to interpret. Patient A/O, VSS, NSR on the monitor, pain 0/10. POC discussed, patient verbalized understanding. Patient resting comfortably in bed. afleming, RN

## 2015-02-18 NOTE — Progress Notes (Signed)
2 Days Post-Op Procedure(s) (LRB): CORONARY ARTERY BYPASS GRAFTING (CABG) x5 using left internal mammary artery, right thigh greater saphenous vein, and left radial artery.  (N/A) TRANSESOPHAGEAL ECHOCARDIOGRAM (TEE) (N/A) RADIAL ARTERY HARVEST (Left) Subjective: No complaints, denies pain  Objective: Vital signs in last 24 hours: Temp:  [98 F (36.7 C)-99.6 F (37.6 C)] 98.5 F (36.9 C) (04/22 1138) Pulse Rate:  [71-89] 82 (04/22 1000) Cardiac Rhythm:  [-] Normal sinus rhythm (04/22 1000) Resp:  [17-32] 28 (04/22 1000) BP: (108-138)/(59-88) 115/59 mmHg (04/22 1000) SpO2:  [90 %-98 %] 90 % (04/22 1000) Arterial Line BP: (121-137)/(62-77) 121/62 mmHg (04/21 1400) Weight:  [190 lb 14.7 oz (86.6 kg)] 190 lb 14.7 oz (86.6 kg) (04/22 0500)  Hemodynamic parameters for last 24 hours:    Intake/Output from previous day: 04/21 0701 - 04/22 0700 In: 934.5 [P.O.:360; I.V.:274.5; IV Piggyback:300] Out: 1645 [Urine:1605; Chest Tube:40] Intake/Output this shift: Total I/O In: 170 [P.O.:120; IV Piggyback:50] Out: 150 [Urine:150]  General appearance: alert, cooperative and no distress Neurologic: intact  Cardiac: RRR, no rub Lungs slightly diminished in bases  Lab Results:  Recent Labs  02/17/15 1515 02/18/15 0415  WBC 18.9* 16.6*  HGB 14.6 12.6*  HCT 41.2 36.9*  PLT 80* 84*   BMET:  Recent Labs  02/17/15 0500 02/17/15 1451 02/17/15 1515 02/18/15 0415  NA 139 137  --  135  K 3.8 4.2  --  4.0  CL 107 103  --  100  CO2 25  --   --  26  GLUCOSE 101* 117*  --  140*  BUN 10 13  --  14  CREATININE 0.82 0.90 0.98 1.02  CALCIUM 8.1*  --   --  8.1*    PT/INR:  Recent Labs  02/16/15 2210  LABPROT 15.7*  INR 1.24   ABG    Component Value Date/Time   PHART 7.316* 02/16/2015 1843   HCO3 23.4 02/16/2015 1843   TCO2 19 02/17/2015 1451   ACIDBASEDEF 3.0* 02/16/2015 1843   O2SAT 94.0 02/16/2015 1843   CBG (last 3)   Recent Labs  02/18/15 0001 02/18/15 0345  02/18/15 0717  GLUCAP 175* 152* 134*    Assessment/Plan: S/P Procedure(s) (LRB): CORONARY ARTERY BYPASS GRAFTING (CABG) x5 using left internal mammary artery, right thigh greater saphenous vein, and left radial artery.  (N/A) TRANSESOPHAGEAL ECHOCARDIOGRAM (TEE) (N/A) RADIAL ARTERY HARVEST (Left) -  Doing well POD # 2 Transfer to 2 west CV- stable, resume lisinopril in Am  Continue lopressor, ASA, statin  RESP_ IS  RENAL- stable, creatinine OK, continue diuresis  ENDO- CBG well controlled, resume metformin  Thrombocytopenia- stable, no lovenox   LOS: 6 days    [name] 02/18/2015

## 2015-02-18 NOTE — Progress Notes (Signed)
Interpreter Wyvonnia Dusky for [facility]

## 2015-02-18 NOTE — Care Management Note (Signed)
    Page 1 of 2   02/18/2015     1:08:33 PM CARE MANAGEMENT NOTE 02/18/2015  Patient:  Isaac Ray, Isaac Ray   Account Number:  [id number]  Date Initiated:  02/14/2015  Documentation initiated by:  [facility]  Subjective/Objective Assessment:   NSTEMI     Action/Plan:   lives at home with wife, Isaac Ray   Anticipated DC Date:  02/21/2015   Anticipated DC Plan:  [place]  CM consult  [facility]  Medication Assistance      Choice offered to / List presented to:             Status of service:  In process, will continue to follow Medicare Important Message given?   (If response is "NO", the following Medicare IM given date fields will be blank) Date Medicare IM given:   Medicare IM given by:   Date Additional Medicare IM given:   Additional Medicare IM given by:    Discharge Disposition:    Per UR Regulation:  Reviewed for med. necessity/level of care/duration of stay  If discussed at [place] of Stay Meetings, dates discussed:    Comments:  ContactMarshall Cork Daughter [phone]   02-18-15 8:50am Luz Lex, RNBSN [phone] Post op CABG x5 on 02-16-15.  Progressing.  Contacted [place] interpretor to talk to patient at 12:30pm Today. 12:30pm  Met with interpretor, Isaac Ray - spouse, and patient. Patient lives with wife in [place] but are up here visiting his daughter who is excpecting.  Independent prior to admission - no DME.   Patient is going home with daughter who lives here.  Wife, Isaac Ray will be with him 24/7.  Plan to be here for follow up with surgeon.  [facility] - so he can also go and get his meds as early as Monday.  02/15/15- 1030- Marvetta Gibbons RN, BSN [phone] plan for CABG on 02/16/15  02/14/2015 1400 NCM contacted Spanish Interpreter. Provided pt with [facility] brochure and explained NCM will arrange follow up appt at time of dc and he can pick up his meds from  the pharamcy at dc. Waiting final dc recommendations for home. Jonnie Finner RN CCM Case Mgmt phone [phone]

## 2015-02-19 ENCOUNTER — Inpatient Hospital Stay (HOSPITAL_COMMUNITY): Payer: Medicaid Other

## 2015-02-19 LAB — CBC
HEMATOCRIT: 34.5 % — AB (ref 39.0–52.0)
Hemoglobin: 11.9 g/dL — ABNORMAL LOW (ref 13.0–17.0)
MCH: 30.9 pg (ref 26.0–34.0)
MCHC: 34.5 g/dL (ref 30.0–36.0)
MCV: 89.6 fL (ref 78.0–100.0)
PLATELETS: 105 10*3/uL — AB (ref 150–400)
RBC: 3.85 MIL/uL — ABNORMAL LOW (ref 4.22–5.81)
RDW: 13.1 % (ref 11.5–15.5)
WBC: 13.6 10*3/uL — ABNORMAL HIGH (ref 4.0–10.5)

## 2015-02-19 LAB — BASIC METABOLIC PANEL
ANION GAP: 10 (ref 5–15)
BUN: 13 mg/dL (ref 6–23)
CO2: 22 mmol/L (ref 19–32)
Calcium: 8.2 mg/dL — ABNORMAL LOW (ref 8.4–10.5)
Chloride: 105 mmol/L (ref 96–112)
Creatinine, Ser: 0.92 mg/dL (ref 0.50–1.35)
Glucose, Bld: 155 mg/dL — ABNORMAL HIGH (ref 70–99)
POTASSIUM: 3.9 mmol/L (ref 3.5–5.1)
SODIUM: 137 mmol/L (ref 135–145)

## 2015-02-19 LAB — GLUCOSE, CAPILLARY
Glucose-Capillary: 153 mg/dL — ABNORMAL HIGH (ref 70–99)
Glucose-Capillary: 181 mg/dL — ABNORMAL HIGH (ref 70–99)
Glucose-Capillary: 120 mg/dL — ABNORMAL HIGH (ref 70–99)
Glucose-Capillary: 146 mg/dL — ABNORMAL HIGH (ref 70–99)

## 2015-02-19 NOTE — Progress Notes (Signed)
Patient ambulated 550 feet unassisted with RN standby on room air.  Patient tolerated well;returned to room and call bell within reach.

## 2015-02-19 NOTE — Progress Notes (Signed)
CARDIAC REHAB PHASE I   PRE:  Rate/Rhythm: 100 ST  BP:  Supine:   Sitting: 104/60  Standing:    SaO2: 94 RA  MODE:  Ambulation: 350 ft   POST:  Rate/Rhythm: 95 RS  BP:  Supine:   Sitting: 110/64  Standing:    SaO2: 98 RA Tolerated ambulation well independently once today already.  Tolerated ambulation well with me.  We will go over his discharge education with an interpreter on Monday before discharge.  Visually demonstrated getting up and down from his chair correctly.  Demonstrated IS usage correctly for me and sternal pillow.   [id number]   Cindra Eves RN, BSN 02/19/2015 9:54 AM

## 2015-02-19 NOTE — Progress Notes (Signed)
[address]       Isaac Ray [address]             [id number]      3 Days Post-Op Procedure(s) (LRB): CORONARY ARTERY BYPASS GRAFTING (CABG) x5 using left internal mammary artery, right thigh greater saphenous vein, and left radial artery.  (N/A) TRANSESOPHAGEAL ECHOCARDIOGRAM (TEE) (N/A) RADIAL ARTERY HARVEST (Left) Subjective: No complaints  Objective: Vital signs in last 24 hours: Temp:  [98.3 F (36.8 C)-99.1 F (37.3 C)] 98.6 F (37 C) (04/23 0505) Pulse Rate:  [80-93] 89 (04/23 0505) Cardiac Rhythm:  [-] Normal sinus rhythm (04/22 2135) Resp:  [16-35] 17 (04/23 0505) BP: (108-131)/(54-69) 111/65 mmHg (04/23 0505) SpO2:  [90 %-95 %] 93 % (04/23 0505) Weight:  [186 lb 15.2 oz (84.8 kg)] 186 lb 15.2 oz (84.8 kg) (04/23 0505)  Hemodynamic parameters for last 24 hours:    Intake/Output from previous day: 04/22 0701 - 04/23 0700 In: 170 [P.O.:120; IV Piggyback:50] Out: 1250 [Urine:1250] Intake/Output this shift:    General appearance: alert, cooperative and no distress Heart: regular rate and rhythm Lungs: clear to auscultation bilaterally Abdomen: benign Extremities: minor edema Wound: incis healing well , left arm neuro-vasc intact  Lab Results:  Recent Labs  02/18/15 0415 02/19/15 0417  WBC 16.6* 13.6*  HGB 12.6* 11.9*  HCT 36.9* 34.5*  PLT 84* 105*   BMET:  Recent Labs  02/18/15 0415 02/19/15 0417  NA 135 137  K 4.0 3.9  CL 100 105  CO2 26 22  GLUCOSE 140* 155*  BUN 14 13  CREATININE 1.02 0.92  CALCIUM 8.1* 8.2*    PT/INR:  Recent Labs  02/16/15 2210  LABPROT 15.7*  INR 1.24   ABG    Component Value Date/Time   PHART 7.316* 02/16/2015 1843   HCO3 23.4 02/16/2015 1843   TCO2 19 02/17/2015 1451   ACIDBASEDEF 3.0* 02/16/2015 1843   O2SAT 94.0 02/16/2015 1843   CBG (last 3)   Recent Labs  02/18/15 1633 02/18/15 2056 02/19/15 0640  GLUCAP 142* 147* 153*    Meds Scheduled Meds: . acetaminophen   1,000 mg Oral 4 times per day   Or  . acetaminophen (TYLENOL) oral liquid 160 mg/5 mL  1,000 mg Per Tube 4 times per day  . aspirin EC  325 mg Oral Daily   Or  . aspirin  324 mg Per Tube Daily  . atorvastatin  80 mg Oral q1800  . bisacodyl  10 mg Oral Daily   Or  . bisacodyl  10 mg Rectal Daily  . docusate sodium  200 mg Oral Daily  . furosemide  40 mg Oral Daily  . insulin aspart  0-15 Units Subcutaneous TID WC  . insulin aspart  0-5 Units Subcutaneous QHS  . insulin detemir  30 Units Subcutaneous Daily  . isosorbide mononitrate  30 mg Oral Daily  . lisinopril  5 mg Oral Daily  . metFORMIN  500 mg Oral BID WC  . metoprolol tartrate  12.5 mg Oral BID   Or  . metoprolol tartrate  12.5 mg Per Tube BID  . moving right along book   Does not apply Once  . pantoprazole  40 mg Oral Daily  . potassium chloride  20 mEq Oral BID  . sodium chloride  3 mL Intravenous Q12H   Continuous Infusions:  PRN Meds:.sodium chloride, alum & mag hydroxide-simeth, magnesium hydroxide, ondansetron (ZOFRAN) IV, oxyCODONE, sodium chloride, zolpidem  Xrays Dg  Chest Port 1 View  02/18/2015   CLINICAL DATA:  CABG  EXAM: PORTABLE CHEST - 1 VIEW  COMPARISON:  02/17/2015  FINDINGS: Prior CABG. Interval removal of left chest tube without pneumothorax. Removal of Swan-Ganz catheter. Mild cardiomegaly. Low lung volumes with bibasilar atelectasis and left upper lobe opacity, likely atelectasis, improving. No visible pneumothorax.  IMPRESSION: Improving bibasilar and left upper lobe atelectasis. No visible pneumothorax currently.   Electronically Signed   By: Charlett Nose M.D.   On: 02/18/2015 05:54    Assessment/Plan: S/P Procedure(s) (LRB): CORONARY ARTERY BYPASS GRAFTING (CABG) x5 using left internal mammary artery, right thigh greater saphenous vein, and left radial artery.  (N/A) TRANSESOPHAGEAL ECHOCARDIOGRAM (TEE) (N/A) RADIAL ARTERY HARVEST (Left)  1 excellent progress 2 routine pulm toilet/rehab 3  hemodyn stable in sinus rhythm 4 sugars adeq controlled 5 labs stable, leukocytosis improving, platelets improving   LOS: 7 days    GOLD,WAYNE E 02/19/2015

## 2015-02-20 LAB — GLUCOSE, CAPILLARY
GLUCOSE-CAPILLARY: 127 mg/dL — AB (ref 70–99)
Glucose-Capillary: 132 mg/dL — ABNORMAL HIGH (ref 70–99)

## 2015-02-20 MED ORDER — ATORVASTATIN CALCIUM 80 MG PO TABS: 80.0000 mg | ORAL_TABLET | Freq: Every day | ORAL | Status: DC

## 2015-02-20 MED ORDER — ISOSORBIDE MONONITRATE ER 30 MG PO TB24: 30.0000 mg | ORAL_TABLET | Freq: Every day | ORAL | Status: DC

## 2015-02-20 MED ORDER — METOPROLOL TARTRATE 25 MG PO TABS: 12.5000 mg | ORAL_TABLET | Freq: Two times a day (BID) | ORAL | Status: DC

## 2015-02-20 MED ORDER — METFORMIN HCL 1000 MG PO TABS: 1000.0000 mg | ORAL_TABLET | Freq: Two times a day (BID) | ORAL | Status: DC

## 2015-02-20 MED ORDER — GLIPIZIDE ER 10 MG PO TB24: 10.0000 mg | ORAL_TABLET | Freq: Every day | ORAL | Status: DC

## 2015-02-20 MED ORDER — OXYCODONE HCL 5 MG PO TABS: 5.0000 mg | ORAL_TABLET | ORAL | Status: DC | PRN

## 2015-02-20 NOTE — Progress Notes (Addendum)
4 Days Post-Op Procedure(s) (LRB): CORONARY ARTERY BYPASS GRAFTING (CABG) x5 using left internal mammary artery, right thigh greater saphenous vein, and left radial artery.  (N/A) TRANSESOPHAGEAL ECHOCARDIOGRAM (TEE) (N/A) RADIAL ARTERY HARVEST (Left) Subjective: conts to feel well  Objective: Vital signs in last 24 hours: Temp:  [98.2 F (36.8 C)-98.6 F (37 C)] 98.6 F (37 C) (04/24 0439) Pulse Rate:  [67-80] 67 (04/24 0439) Cardiac Rhythm:  [-] Normal sinus rhythm (04/23 2000) Resp:  [18-19] 18 (04/24 0439) BP: (108-128)/(55-66) 108/66 mmHg (04/24 0439) SpO2:  [92 %-96 %] 96 % (04/24 0439) Weight:  [184 lb 1.4 oz (83.5 kg)] 184 lb 1.4 oz (83.5 kg) (04/24 0439)  Hemodynamic parameters for last 24 hours:    Intake/Output from previous day: 04/23 0701 - 04/24 0700 In: 360 [P.O.:360] Out: -  Intake/Output this shift:    General appearance: alert, cooperative and no distress Heart: regular rate and rhythm Lungs: mildly dim in bases Abdomen: benign Extremities: no edema Wound: healing well, no problems with left hand  Lab Results:  Recent Labs  02/18/15 0415 02/19/15 0417  WBC 16.6* 13.6*  HGB 12.6* 11.9*  HCT 36.9* 34.5*  PLT 84* 105*   BMET:  Recent Labs  02/18/15 0415 02/19/15 0417  NA 135 137  K 4.0 3.9  CL 100 105  CO2 26 22  GLUCOSE 140* 155*  BUN 14 13  CREATININE 1.02 0.92  CALCIUM 8.1* 8.2*    PT/INR: No results for input(s): LABPROT, INR in the last 72 hours. ABG    Component Value Date/Time   PHART 7.316* 02/16/2015 1843   HCO3 23.4 02/16/2015 1843   TCO2 19 02/17/2015 1451   ACIDBASEDEF 3.0* 02/16/2015 1843   O2SAT 94.0 02/16/2015 1843   CBG (last 3)   Recent Labs  02/19/15 1614 02/19/15 2131 02/20/15 0611  GLUCAP 120* 146* 127*    Meds Scheduled Meds: . acetaminophen  1,000 mg Oral 4 times per day   Or  . acetaminophen (TYLENOL) oral liquid 160 mg/5 mL  1,000 mg Per Tube 4 times per day  . aspirin EC  325 mg Oral Daily    Or  . aspirin  324 mg Per Tube Daily  . atorvastatin  80 mg Oral q1800  . bisacodyl  10 mg Oral Daily   Or  . bisacodyl  10 mg Rectal Daily  . docusate sodium  200 mg Oral Daily  . furosemide  40 mg Oral Daily  . insulin aspart  0-15 Units Subcutaneous TID WC  . insulin aspart  0-5 Units Subcutaneous QHS  . insulin detemir  30 Units Subcutaneous Daily  . isosorbide mononitrate  30 mg Oral Daily  . lisinopril  5 mg Oral Daily  . metFORMIN  500 mg Oral BID WC  . metoprolol tartrate  12.5 mg Oral BID   Or  . metoprolol tartrate  12.5 mg Per Tube BID  . moving right along book   Does not apply Once  . pantoprazole  40 mg Oral Daily  . potassium chloride  20 mEq Oral BID  . sodium chloride  3 mL Intravenous Q12H   Continuous Infusions:  PRN Meds:.sodium chloride, alum & mag hydroxide-simeth, magnesium hydroxide, ondansetron (ZOFRAN) IV, oxyCODONE, sodium chloride, zolpidem  Xrays Dg Chest 2 View  02/19/2015   CLINICAL DATA:  44 year old male with a history of CABG, 02/16/2015  EXAM: CHEST - 2 VIEW  COMPARISON:  02/18/2015, 02/17/2015, 02/16/2015  FINDINGS: Cardiomediastinal silhouette unchanged in size and contour.  Surgical changes of median sternotomy and CABG.  Improving aeration with no pneumothorax. Linear opacities in the left hilum persist.  Interval removal of right IJ central catheter. EKG leads tract over the right mediastinum.  Percutaneous epicardial pacing leads visualized over the upper abdomen.  No displaced fracture.  IMPRESSION: Improving lung aeration status post median sternotomy and CABG.  Interval removal of right IJ catheter.  Persisting transcutaneous epicardial pacing leads.  Signed,  Yvone Neu. Loreta Ave, DO  [facility]  [facility]   Electronically Signed   By: Gilmer Mor D.O.   On: 02/19/2015 08:17    Assessment/Plan: S/P Procedure(s) (LRB): CORONARY ARTERY BYPASS GRAFTING (CABG) x5 using left internal mammary  artery, right thigh greater saphenous vein, and left radial artery.  (N/A) TRANSESOPHAGEAL ECHOCARDIOGRAM (TEE) (N/A) RADIAL ARTERY HARVEST (Left)  1 doing well 2 d/c pacer wires today and d/c later in afternoon 3 increase glucophage dose and add glucotrol at d/c   LOS: 8 days    Prima Rayner E 02/20/2015

## 2015-02-20 NOTE — Progress Notes (Signed)
Removed epicardial pacing wires per MD order and unit protocol.  Sutures were clipped and wires were pulled without difficulty.  Pt had no complaints.  Chest tube sutures were removed and steristrips were placed over ct sites.  Sites are all dry/intact.  VS remained stable before and after procedure.  Will continue to monitor.

## 2015-02-20 NOTE — Progress Notes (Signed)
Pt discharged home with wife.  Alert and oriented x4.  No c/o pain.  Education given to pt in spanish verbally and written on diet, activity, meds, and follow-up care and instructions.  Pt and wife verbalized understanding.  IV D/C'd.  Tele D/Cd.  Prescriptions given to patient, and take home instructions were sent home in spanish.

## 2015-02-20 NOTE — Discharge Instructions (Signed)
La diabetes mellitus y los alimentos (Diabetes Mellitus and Food) Es importante que controle su nivel de azcar en la sangre (glucosa). El nivel de glucosa en sangre depende en gran medida de lo que usted come. Comer alimentos saludables en las cantidades [place] a lo largo del Futures trader, aproximadamente a la misma hora [employer], lo ayudar a Chief Operating Officer su nivel de Event organiser. Tambin puede ayudarlo a retrasar o Fish farm manager de la diabetes mellitus. Comer de [employer] saludable incluso puede ayudarlo a Event organiser de presin arterial y a Barista o Pharmacologist un peso saludable.  CMO PUEDEN AFECTARME LOS ALIMENTOS? Carbohidratos Los carbohidratos afectan el nivel de glucosa en sangre ms que cualquier otro tipo de alimento. El nutricionista lo ayudar a Chief Strategy Officer cuntos carbohidratos puede consumir en cada comida y ensearle a contarlos. El recuento de carbohidratos es importante para mantener la glucosa en sangre en un nivel saludable, en especial si utiliza insulina o toma determinados medicamentos para la diabetes mellitus. Alcohol El alcohol puede provocar disminuciones sbitas de la glucosa en sangre (hipoglucemia), en especial si utiliza insulina o toma determinados medicamentos para la diabetes mellitus. La hipoglucemia es una afeccin que puede poner en peligro la vida. Los sntomas de la hipoglucemia (somnolencia, mareos y Administrator) son similares a los sntomas de haber consumido mucho alcohol.  Si el mdico lo autoriza a beber alcohol, hgalo con moderacin y siga estas pautas:  Las mujeres no deben beber ms de un trago por da, y los hombres no deben beber ms de dos tragos por Futures trader. Un trago es igual a:  12 onzas (355 ml) de cerveza  5 onzas de vino (150 ml) de vino  1,5onzas (45ml) de bebidas espirituosas  No beba con el estmago vaco.  Mantngase hidratado. Beba agua, gaseosas dietticas o t helado sin azcar.  Las gaseosas comunes, los jugos y  otros refrescos podran contener muchos carbohidratos y se Heritage manager. QU ALIMENTOS NO SE RECOMIENDAN? Cuando haga las elecciones de alimentos, es importante que recuerde que todos los alimentos son distintos. Algunos tienen menos nutrientes que otros por porcin, aunque podran tener la misma cantidad de caloras o carbohidratos. Es difcil darle al cuerpo lo que necesita cuando consume alimentos con menos nutrientes. Estos son algunos ejemplos de alimentos que debera evitar ya que contienen muchas caloras y carbohidratos, pero pocos nutrientes:  Neurosurgeon trans (la mayora de los alimentos procesados incluyen grasas trans en la etiqueta de Informacin nutricional).  Gaseosas comunes.  Jugos.  Caramelos.  Dulces, como tortas, pasteles, rosquillas y [place].  Comidas fritas. QU ALIMENTOS PUEDO COMER? Consuma alimentos ricos en nutrientes, que nutrirn el cuerpo y lo mantendrn saludable. Los alimentos que debe comer tambin dependern de varios factores, como:  Las caloras que necesita.  Los medicamentos que toma.  Su peso.  El nivel de glucosa en [place].  El [place] de presin arterial.  El nivel de colesterol. Tambin debe consumir una variedad de [place], como:  Protenas, como carne, aves, pescado, tofu, frutos secos y semillas (las protenas de [place] magros son mejores).  Nils Pyle.  Verduras.  Productos lcteos, como [place], queso y yogur (descremados son mejores).  Panes, granos, pastas, cereales, arroz y frijoles.  Grasas, como aceite de [place], [place] sin grasas trans, aceite de canola, aguacate y [place]. TODOS LOS QUE PADECEN DIABETES MELLITUS TIENEN EL MISMO PLAN DE COMIDAS? Dado que todas las personas que padecen diabetes mellitus son distintas, no hay un solo plan de comidas que funcione para todos. Es [address]  importante que se rena con un nutricionista que lo ayudar a crear un plan de comidas adecuado para usted. Document Released: 01/22/2008  Document Revised: 10/20/2013 [facility] Patient Information 2015 [place], [place]. This information is not intended to replace advice given to you by your health care provider. Make sure you discuss any questions you have with your health care provider. Reseccin endoscpica de la vena safena (Endoscopic Saphenous Vein Harvesting) Cuidados posteriores Siga estas instrucciones durante las prximas semanas. Estas indicaciones le proporcionan informacin general acerca de cmo deber cuidarse despus del procedimiento. El mdico tambin podr darle instrucciones ms especficas. El tratamiento se ha planificado de acuerdo a las prcticas mdicas actuales, pero a veces se producen problemas. Comunquese con el mdico si tiene algn problema o tiene dudas despus del procedimiento. INSTRUCCIONES PARA EL CUIDADO EN EL HOGAR Medicamentos  Tome los medicamentos para Primary school teacher que le recet el mdico. Siga cuidadosamente las indicaciones. No tome ningn analgsico de venta libre hasta que el cirujano lo autorice. Algunos medicamentos para el dolor pueden causar problemas hemorrgicos durante algunas semanas despus de la [place].  Siga las instrucciones de su mdico con respecto a Solicitor. Probablemente no le permitirn conducir despus de una ciruga cardaca.  Tome los [employer] le recet el cirujano. El mdico debe controlar todos los medicamentos que tom con anterioridad a la ciruga cardaca, antes de que comience a tomarlos nuevamente. Cuidado de las heridas  Si el cirujano ha indicado un vendaje o una media elstica, pregunte durante cunto tiempo deber usarla.  Controle la zona alrededor [employer] cortes quirrgicos (incisiones) cada vez que cambie los vendajes. Observe si hay enrojecimiento o hinchazn.  Tendr que volver para que le saquen los puntos (suturas) o las grapas. Consulte al cirujano cundo deber hacerlo.  Pregunte al Nicholes Mango cundo podr volver a  trabajar. Actividad  Trate de [employer] las piernas elevadas cuando est sentado.  Haga los ejercicios que los mdicos le indicaron. Podrn incluir ejercicios de respiracin profunda, toser, caminar y otros. SOLICITE ATENCIN MDICA SI:  Tiene dudas relacionadas con los medicamentos.  Aumenta el dolor en la pierna, especialmente si el medicamento para el dolor deja de Scientist, water quality.  Aparecen nuevos hematomas en la pierna.  La pierna se hincha, siente presin o presenta enrojecimiento.  Siente adormecimiento en la pierna. SOLICITE ATENCIN MDICA DE INMEDIATO SI:  El dolor empeora.  Alguna de las heridas supura sangre o lquido.  La herida est roja, hinchada o caliente.  Siente dolor en el pecho.  Tiene dificultad para respirar.  Tiene fiebre.  Siente dolor cerca de la incisin en la pierna. ASEGRESE DE QUE:  Comprende estas instrucciones.  Controlar su afeccin.  Recibir ayuda de inmediato si no mejora o si empeora. Document Released: 06/27/2011 Document Revised: 10/20/2013 [facility] Patient Information 2015 [place], [place]. This information is not intended to replace advice given to you by your health care provider. Make sure you discuss any questions you have with your health care provider. Injerto de revascularizacin arterial coronaria, cuidados posteriores (Coronary Artery Bypass Grafting, Care After) Estas indicaciones le proporcionan informacin general acerca de cmo deber cuidarse despus del procedimiento. El mdico tambin podr darle instrucciones especficas. Comunquese con el mdico si tiene algn problema o tiene preguntas despus del procedimiento.  CUIDADOS EN EL HOGAR  Tome los medicamentos solamente como se lo haya indicado el mdico. Tome los medicamentos exactamente como se lo hayan indicado. No deje de tomar los medicamentos ni empiece a tomar medicamentos nuevos sin hablar primero  con el mdico.  Tmese el pulso como se lo haya indicado el  mdico.  Haga ejercicios de respiracin profunda como se lo haya indicado el mdico. Use el dispositivo de respiracin (espirmetro de incentivo), si se lo dieron, para Education administrator los ejercicios de respiracin profunda varias veces al da. Presione su pecho con una almohada o sus brazos al respirar profundamente y toser.  Mantenga limpia, seca y protegida la zona donde se realizaron los cortes quirrgicos (incisiones). Retire los vendajes solamente como se lo haya indicado el mdico. Si le pusieron tiras en la zona de la ciruga, no las retire. Estas se caen solas.  Revise diariamente la zona de la ciruga para ver si est inflamada (hinchada) o enrojecida, o si hay secrecin de lquido.  Si los cortes quirrgicos se hicieron en las piernas:  Evite cruzar las piernas.  Evite estar sentado durante largos perodos. Cambie de posicin cada .  Eleve las piernas cuando est sentado. Apyelas sobre almohadas.  Use medias que ayuden a evitar que se le formen cogulos sanguneos en las piernas (medias de compresin).  Tome solamente baos de esponja hasta que el mdico le permita ducharse. Seque la zona de la ciruga con golpecitos suaves. No la frote con un pao ni con una toalla. No se d baos de inmersin, no practique natacin ni use el jacuzzi hasta que el mdico lo autorice.  Coma alimentos ricos en fibra, entre ellos, frutas y verduras crudas, cereales integrales, frijoles y frutos secos. Elija las carnes [place]. Evite los alimentos enlatados, procesados y fritos.  Beba suficiente lquido para mantener el pis (orina) claro o de color amarillo plido.  Controle su peso a diario.  Descanse y limite sus actividades como se lo haya indicado el mdico. Es posible que le indiquen que:  Interrumpa cualquier actividad si tiene Engineer, mining de [place], falta de Impact, cambios en los latidos del corazn o [place]. Si esto ocurre, solicite ayuda inmediatamente.  Muvase con frecuencia durante breves  perodos de tiempo o haga caminatas cortas como se lo haya indicado el mdico. Aumente gradualmente el nivel de [place]. Es posible que necesite ayuda para Therapist, music los msculos y aumentar la resistencia.  Evite levantar, empujar o tirar de objetos que pesen ms de 10libras (4.5kg) durante al menos 6semanas despus de la [place].  No conduzca hasta que el mdico lo autorice.  Pregntele al mdico cundo puede volver a Printmaker.  Pregntele al mdico cundo puede reanudar la actividad sexual.  Concurra a las consultas de control con el mdico, segn las indicaciones. SOLICITE AYUDA SI:  Tiene inflamacin, enrojecimiento, ms dolor o secrecin de lquido en el lugar de la incisin.  Tiene fiebre.  Se le hinchan los tobillos o las piernas.  Siente dolor en las piernas.  Aumenta 2libras (0.9kg) o ms por da.  Tiene malestar estomacal (nuseas) o vomita.  La materia fecal es lquida (diarrea). SOLICITE AYUDA DE INMEDIATO SI:  Tiene dolor de pecho que se extiende [place] su mandbula o brazos.  Le falta el aire.  Tiene latidos cardacos rpidos e irregulares.  Percibe un "chasquido" en el esternn cuando se mueve.  Siente debilidad o adormecimiento en los brazos o en las piernas.  Se siente mareado o aturdido. ASEGRESE DE QUE:  Comprende estas instrucciones.  Controlar su afeccin.  Recibir ayuda de inmediato si no mejora o si empeora. Document Released: 10/20/2013 [facility] Patient Information 2015 [place], [place]. This information is not intended to replace advice given to you by your health care provider. Make  sure you discuss any questions you have with your health care provider.

## 2015-02-20 NOTE — Discharge Summary (Signed)
[address]       [place] [address]             [phone]      Physician Discharge Summary  Patient ID: Isaac Ray MRN: [id number] DOB/AGE: Apr 23, 1971 44 y.o.  Admit date: 02/11/2015 Discharge date: 02/20/2015  Admission Diagnoses:CAD  Discharge Diagnoses:  Active Problems:   NSTEMI (non-ST elevated myocardial infarction)   S/P CABG x 5  Patient Active Problem List   Diagnosis Date Noted  . 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 NSTEMI (non-ST elevated myocardial infarction) 02/12/2015   Past Medical History  Diagnosis Date  . Essential hypertension   . Type 2 diabetes mellitus   . Hyperlipidemia   . CAD (coronary artery disease)     Past Surgical History  Procedure Laterality Date  . Coronary angioplasty with stent placement  2013         HPI:  The patient is a 44 year old Hispanic male from [place]. He was in [place] visiting his daughter. He has a  history of coronary artery disease with 2 previous myocardial infarctions treated with PTCA and stenting including LAD. He has multiple medical risk factors including hypertension, hyperlipidemia, type 2 diabetes mellitus, a strong family history of coronary disease and ongoing tobacco abuse. He presented with a 24-hour history of chest pain and shortness of breath. He took nitroglycerin and had relief. The pain would recur as the nitroglycerin wore off. He was admitted to Hospital ruling in for a non-Q wave myocardial infarction. He was felt to require admission for further evaluation and treatment to include cardiac catheterization.   Discharged Condition: good  Hospital Course: The patient was admitted through cardiology and medically stabilized. He was taken to the cardiac catheterization lab on 02/14/2015 by Dr. Allyson Sabal. He was found to have severe three-vessel disease and severe LV dysfunction. Both stented arteries were occluded. Cardiothoracic surgical consultation was  obtained with Charlett Lango M.D. who evaluated the patient studies and agreed with recommendations to proceed with coronary artery surgical revascularization. On 02/16/2015 he was taken the operating room at which time he underwent following procedure:  DATE OF PROCEDURE: 02/16/2015 DATE OF DISCHARGE:   OPERATIVE REPORT   PREOPERATIVE DIAGNOSIS: Severe three-vessel coronary artery disease status post non-ST-elevation myocardial infarction.  POSTOPERATIVE DIAGNOSIS: Severe three-vessel coronary artery disease status post non-ST-elevation myocardial infarction.  PROCEDURE: Median sternotomy, coronary artery bypass grafting x5, left internal mammary artery to left anterior descending, sequential left radial artery to second diagonal and first diagonal, sequential saphenous vein graft to obtuse marginals 1 and 2, endoscopic vein harvest right thigh, open left radial artery harvest.  SURGEON: Salvatore Decent. Dorris Fetch, MD  ASSISTANT: 1. Coral Ceo, P.A. 2. Lowella Dandy.  ANESTHESIA: General.  FINDINGS: Transesophageal echocardiography revealed dilated left ventricle with ejection fraction approximately 30%. There was mild mitral regurgitation.  Anterior wall scar LAD and diagonal fair quality target vessels, OM 1 and 2 poor quality target vessels, good quality conduits. Postbypass transesophageal echocardiography unchanged. The patient was taken from the operating room to the surgical intensive care unit in good condition.  Postoperative hospital course:  The patient has done well. He was weaned off dopamine without difficulty. He was weaned from the ventilator without difficulty using standard protocols. All routine lines, monitors and drainage devices have been discontinued in the standard fashion. He has been started on Imdur or postoperatively for his radial artery graft. His left arm remains neurologically intact without  vascular compromise. His  blood sugars have been under adequate control using standard protocols. He will require more aggressive control of his diabetes is an outpatient and will be discharged on a higher dose of Glucophage and in addition he will be started on Glucotrol. His incisions are noted to be healing well without evidence of infection. Oxygen has been between without difficulty. He was tolerating routine cardiac rehabilitation modalities using standard protocols. He is maintained normal sinus rhythm. He has had mild volume overload but responded well to diuretics. He has a mild acute blood loss anemia postoperatively. This is stable. His overall status is felt to be quite stable for discharge on today's date.  Consults: cardiology and Cardiothoracic surgery  Significant Diagnostic Studies: angiography: Coronary   Discharge Exam: Blood pressure 113/65, pulse 72, temperature 98.6 F (37 C), temperature source Oral, resp. rate 18, height 5\' 7"  (1.702 m), weight 184 lb 1.4 oz (83.5 kg), SpO2 96 %. General appearance: alert, cooperative and no distress Heart: regular rate and rhythm Lungs: mildly dim in bases Abdomen: benign Extremities: no edema Wound: healing well, no problems with left hand  Disposition: discharge home     Medication List    STOP taking these medications        isosorbide dinitrate 30 MG tablet  Commonly known as:  ISORDIL     nitroGLYCERIN 0.4 MG SL tablet  Commonly known as:  NITROSTAT      TAKE these medications        aspirin 325 MG tablet  Take 325 mg by mouth daily.     atorvastatin 80 MG tablet  Commonly known as:  LIPITOR  Take 1 tablet (80 mg total) by mouth daily at 6 PM.     glipiZIDE 10 MG 24 hr tablet  Commonly known as:  GLUCOTROL XL  Take 1 tablet (10 mg total) by mouth daily with breakfast.     isosorbide mononitrate 30 MG 24 hr tablet  Commonly known as:  IMDUR  Take 1 tablet (30 mg total) by mouth daily.     lisinopril 5 MG  tablet  Commonly known as:  PRINIVIL,ZESTRIL  Take 5 mg by mouth daily.     metFORMIN 1000 MG tablet  Commonly known as:  GLUCOPHAGE  Take 1 tablet (1,000 mg total) by mouth 2 (two) times daily with a meal.     metoprolol tartrate 25 MG tablet  Commonly known as:  LOPRESSOR  Take 0.5 tablets (12.5 mg total) by mouth 2 (two) times daily.     oxyCODONE 5 MG immediate release tablet  Commonly known as:  Oxy IR/ROXICODONE  Take 1-2 tablets (5-10 mg total) by mouth every 4 (four) hours as needed for severe pain.           Follow-up Information    Follow up with [name], MD.   Specialty:  Cardiothoracic Surgery   Why:  office will contact you to see surgeon in 4 weeks   Contact information:   [address] [place] [address] [phone]       Follow up with Runell Gess, MD.   Specialty:  Cardiology   Why:  contact office for appt to follow-up with cardiologist in 2 weeks   Contact information:   [address] [place] [address] [phone]       Signed: Rowe Clack 02/20/2015, 12:13 PM

## 2015-02-24 ENCOUNTER — Telehealth: Payer: Self-pay | Admitting: Thoracic Surgery (Cardiothoracic Vascular Surgery)

## 2015-02-24 NOTE — Addendum Note (Signed)
Addended by: Charlett Lango MD C on: 02/24/2015 08:07 PM   Modules accepted: Orders, Medications

## 2015-02-24 NOTE — Telephone Encounter (Signed)
Friend of patient called to say he has developed a rash on his back and has redness around incision on left arm.  No fevers  Current meds- isosorbide, metoprolol, metformin, atorvastatin, glipizide, and oxycodone  He was on metformin and isosorbide PTA.  Most likely a drug rash- recommended he hold glipizide and atorvastatin and stop oxycodone  Will place prescription for tramadol for pain  Instructed to call office at 9 AM tomorrow to be seen

## 2015-02-25 ENCOUNTER — Telehealth (HOSPITAL_COMMUNITY): Payer: Self-pay | Admitting: Unknown Physician Specialty

## 2015-03-07 ENCOUNTER — Ambulatory Visit: Payer: Self-pay | Attending: Internal Medicine

## 2015-03-09 ENCOUNTER — Encounter: Payer: Self-pay | Admitting: Cardiology

## 2015-03-09 ENCOUNTER — Ambulatory Visit (INDEPENDENT_AMBULATORY_CARE_PROVIDER_SITE_OTHER): Payer: Self-pay | Admitting: Cardiology

## 2015-03-09 VITALS — BP 110/76 | HR 58 | Ht 67.0 in | Wt 179.9 lb

## 2015-03-09 DIAGNOSIS — I2510 Atherosclerotic heart disease of native coronary artery without angina pectoris: Secondary | ICD-10-CM

## 2015-03-09 MED ORDER — CARVEDILOL 3.125 MG PO TABS: 3.1250 mg | ORAL_TABLET | Freq: Two times a day (BID) | ORAL | Status: DC

## 2015-03-09 NOTE — Progress Notes (Signed)
03/09/2015 Isaac Ray   07-01-1971  [id number]  Primary Physician: No primary care provider on file. Primary Cardiologist: Dr. Allyson Sabal  Reason for Visit/CC: Post hospital F/U for CAD/ s/p CABG  HPI:  The patient is a 44 year old spanish speaking male, from [place], with CAD who presents to clinic today for post hospital follow-up. Has a history of hypertension and hyperlipidemia, type 2 diabetes and strong family history for coronary artery disease and ongoing tobacco abuse. He initially presented to [facility] with complaints of chest pain and shortness of breath. He ruled in for non-STEMI.  Subsequent catheterization revealed severe three-vessel disease and severe LV dysfunction. EF 30%. He  underwent coronary artery bypass grafting by Dr. Dorris Fetch. CABG x 5 (left internal mammary artery to left anterior descending, sequential left radial artery to second diagonal and first diagonal, sequential saphenous vein graft to obtuse marginals 1 and 2, endoscopic vein harvest right thigh, open left radial artery harvest).  Discharged home on ASA, Metoprolol tartrate, lisinopril and Imdur.   He presents back to clinic today for f/u. He presents with his wife, daughter and an interpreter. He has done well since discharge. No recurrent angina but still has mild occasional incisional pain. No dyspnea, orthopnea PND, weight gain or LEE. No palpitations, syncope/near syncope. He has been fully compliant with medications. Denies tobacco use. BP today is 110/76. HR 58 bpm. He is scheduled to see Dr. Dorris Fetch on 5/17.   Current Outpatient Prescriptions  Medication Sig Dispense Refill  . aspirin 325 MG tablet Take 325 mg by mouth daily.    Marland Kitchen atorvastatin (LIPITOR) 80 MG tablet Take 1 tablet (80 mg total) by mouth daily at 6 PM. 30 tablet 1  . carvedilol (COREG) 3.125 MG tablet Take 1 tablet (3.125 mg total) by mouth 2 (two) times daily. 180 tablet 3  . glipiZIDE (GLUCOTROL XL) 10 MG 24 hr  tablet Take 1 tablet (10 mg total) by mouth daily with breakfast. 30 tablet 1  . isosorbide mononitrate (IMDUR) 30 MG 24 hr tablet Take 1 tablet (30 mg total) by mouth daily. 30 tablet 0  . lisinopril (PRINIVIL,ZESTRIL) 5 MG tablet Take 5 mg by mouth daily.    . metFORMIN (GLUCOPHAGE) 1000 MG tablet Take 1 tablet (1,000 mg total) by mouth 2 (two) times daily with a meal. 60 tablet 1   No current facility-administered medications for this visit.    No Known Allergies  History   Social History  . Marital Status: Single    Spouse Name: N/A  . Number of Children: N/A  . Years of Education: N/A   Occupational History  . Not on file.   Social History Main Topics  . Smoking status: Current Every Day Smoker  . Smokeless tobacco: Not on file  . Alcohol Use: No  . Drug Use: No  . Sexual Activity: Not on file   Other Topics Concern  . Not on file   Social History Narrative     Review of Systems: General: negative for chills, fever, night sweats or weight changes.  Cardiovascular: negative for chest pain, dyspnea on exertion, edema, orthopnea, palpitations, paroxysmal nocturnal dyspnea or shortness of breath Dermatological: negative for rash Respiratory: negative for cough or wheezing Urologic: negative for hematuria Abdominal: negative for nausea, vomiting, diarrhea, bright red blood per rectum, melena, or hematemesis Neurologic: negative for visual changes, syncope, or dizziness All other systems reviewed and are otherwise negative except as noted above.    Blood pressure  110/76, pulse 58, height 5\' 7"  (1.702 m), weight 179 lb 14.4 oz (81.602 kg).  General appearance: alert, cooperative and no distress Neck: no carotid bruit and no JVD Lungs: clear to auscultation bilaterally Heart: regular rate and rhythm, S1, S2 normal, no murmur, click, rub or gallop Extremities: no LEE Pulses: 2+ and symmetric Neurologic: Grossly normal  EKG sinus brady 58 bpm  ASSESSMENT AND PLAN:     1. CAD: s/p CABG x 5. No recurrent CP. Continue ASA, statin, BB and ACE-I.  2. LV Dysfunction: EF 30%. Will hopefully improved now that he is post revascularization. No dyspnea, orthopnea, PND, weight gain or LEE. Euvolemic on physical exam. Will change BB from Lopressor to Coreg, low dose 3.25 mg BID. Continue Lisinopril.  3. HTN: well controlled.   4. HLD: continue statin therapy  5. DM:  Followed by PCP  PLAN  F/u with Dr. Dorris Fetch next week. F/U with Dr. Allyson Sabal in 2 months.   SIMMONS, BRITTAINYPA-C 03/09/2015 4:40 PM

## 2015-03-09 NOTE — Patient Instructions (Addendum)
STOP Metoprolol START Coreg 3.25mg  TWICE daily Pare el Metoprolol Empesar Coreg 3.25 mg Dos veces al dia  Your physician recommends that you schedule a follow-up appointment in 2 months with Dr.Berry  Su Doctor recomienda que haga otra cita en dos meses para segimiento con el Dr. Allyson Sabal.

## 2015-03-11 ENCOUNTER — Other Ambulatory Visit: Payer: Self-pay | Admitting: *Deleted

## 2015-03-11 ENCOUNTER — Telehealth: Payer: Self-pay | Admitting: Cardiovascular Disease

## 2015-03-11 DIAGNOSIS — G8918 Other acute postprocedural pain: Secondary | ICD-10-CM

## 2015-03-11 MED ORDER — OXYCODONE HCL 5 MG PO TABS: 5.0000 mg | ORAL_TABLET | ORAL | Status: DC | PRN

## 2015-03-11 NOTE — Telephone Encounter (Signed)
Mrs. [name] calls with a request for a refill for her husband's pain med s/p CABG. I said a new script doe Oxycodone would be available at the front desk this afternoon before we closed and she agreed.

## 2015-03-11 NOTE — Telephone Encounter (Signed)
Spoke with pt, aware he needs to call the surgeon's office for refills on pain medicine post CABG

## 2015-03-11 NOTE — Telephone Encounter (Signed)
New Message   Patients daughter is calling on a pain refill that needs to be sent in to pharmacy.

## 2015-03-14 ENCOUNTER — Other Ambulatory Visit: Payer: Self-pay | Admitting: Thoracic Surgery (Cardiothoracic Vascular Surgery)

## 2015-03-14 DIAGNOSIS — Z951 Presence of aortocoronary bypass graft: Secondary | ICD-10-CM

## 2015-03-14 NOTE — Addendum Note (Signed)
Addended by: Neta Ehlers on: 03/14/2015 01:11 PM   Modules accepted: Orders

## 2015-03-15 ENCOUNTER — Ambulatory Visit (INDEPENDENT_AMBULATORY_CARE_PROVIDER_SITE_OTHER): Payer: Self-pay | Admitting: Thoracic Surgery (Cardiothoracic Vascular Surgery)

## 2015-03-15 ENCOUNTER — Encounter: Payer: Self-pay | Admitting: Thoracic Surgery (Cardiothoracic Vascular Surgery)

## 2015-03-15 ENCOUNTER — Ambulatory Visit
Admission: RE | Admit: 2015-03-15 | Discharge: 2015-03-15 | Disposition: A | Discharge status: Home / Self Care | Payer: No Typology Code available for payment source | Source: Ambulatory Visit | Attending: Thoracic Surgery (Cardiothoracic Vascular Surgery) | Admitting: Thoracic Surgery (Cardiothoracic Vascular Surgery)

## 2015-03-15 VITALS — BP 108/69 | HR 66 | Resp 16 | Ht 67.0 in | Wt 171.0 lb

## 2015-03-15 DIAGNOSIS — I2510 Atherosclerotic heart disease of native coronary artery without angina pectoris: Secondary | ICD-10-CM

## 2015-03-15 DIAGNOSIS — Z951 Presence of aortocoronary bypass graft: Secondary | ICD-10-CM

## 2015-03-15 NOTE — Progress Notes (Signed)
[address]       [place] [address]             [phone]       HPI:  Mr. Isaac Ray is a 44 year old Hispanic man from [place] who presented in April with a non-ST elevation MI. He had severe three-vessel disease with ischemic cardiomyopathy and an ejection fraction of 25%. He underwent coronary bypass grafting 5 on 02/16/2015. His postoperative course was unremarkable and he was discharged on postoperative day #4.  He was intended to be discharged on lisinopril 5 mg daily. Apparently he did not receive a prescription for that and has not been taking it.  He is accompanied by a [place] interpreter.  He feels well. His exercise tolerance is good. He denies any anginal chest pain or shortness of breath. He has minimal incisional pain, and is no longer taking oxycodone. He denies swelling. His appetite is improving.  Past Medical History  Diagnosis Date  . Essential hypertension   . Type 2 diabetes mellitus   . Hyperlipidemia   . CAD (coronary artery disease)       Current Outpatient Prescriptions  Medication Sig Dispense Refill  . aspirin 325 MG tablet Take 325 mg by mouth daily.    Marland Kitchen atorvastatin (LIPITOR) 80 MG tablet Take 1 tablet (80 mg total) by mouth daily at 6 PM. 30 tablet 1  . carvedilol (COREG) 3.125 MG tablet Take 1 tablet (3.125 mg total) by mouth 2 (two) times daily. 180 tablet 3  . glipiZIDE (GLUCOTROL XL) 10 MG 24 hr tablet Take 1 tablet (10 mg total) by mouth daily with breakfast. 30 tablet 1  . isosorbide mononitrate (IMDUR) 30 MG 24 hr tablet Take 1 tablet (30 mg total) by mouth daily. 30 tablet 0  . metFORMIN (GLUCOPHAGE) 1000 MG tablet Take 1 tablet (1,000 mg total) by mouth 2 (two) times daily with a meal. 60 tablet 1  . oxyCODONE (OXY IR/ROXICODONE) 5 MG immediate release tablet Take 1 tablet (5 mg total) by mouth every 4 (four) hours as needed for severe pain. 40 tablet 0   No current facility-administered medications for this visit.      Physical Exam BP 108/69 mmHg  Pulse 66  Resp 16  Ht  (1.702 m)  Wt 171 lb (77.565 kg)  BMI 26.78 kg/m2  SpO84 36% 44 year old man in no acute distress Alert and oriented with no focal neurologic deficits Sternum stable, incision clean dry and intact Cardiac regular rate and rhythm normal S1 and S2, no murmur Lungs clear with equal breath sounds bilaterally Left arm incision with minimal eschar, left hand and neurovascularly intact Leg incision healing well, no peripheral edema  Diagnostic Tests: Chest x-ray shows postoperative changes with no effusion  Impression: 44 year old man with multiple cardiac risk factors who presented with a non-ST elevation MI. He had severe three-vessel disease with ischemic cardiomyopathy (EF 25%). He is now about a month out from coronary bypass grafting 5. He is doing extremely well. He has minimal discomfort. His exercise tolerance is improving. He is anxious to increase his activities.  I advised him not to lift anything over 10 pounds for another 2 weeks.  He may begin driving on a limited basis.  He plans to stay in [place] for about a year for going back to [place]. He has a follow-up scheduled with Dr. Allyson Sabal in about 2 months.  His blood pressure is well controlled and he has no  symptoms of congestive heart failure. I recommended that we hold off on lisinopril for now. That can be readdressed later on.  Plan: Complete prescription for him to her then discontinue  Follow-up as scheduled with Dr. Allyson Sabal.  I will be happy to see him back if I can be of any further assistance with his care  [name], MD [facility] [phone]

## 2015-03-21 ENCOUNTER — Telehealth: Payer: Self-pay | Admitting: Cardiovascular Disease

## 2015-03-21 MED ORDER — ISOSORBIDE MONONITRATE ER 30 MG PO TB24: 30.0000 mg | ORAL_TABLET | Freq: Every day | ORAL | Status: DC

## 2015-03-21 NOTE — Telephone Encounter (Signed)
°  1. Which medications need to be refilled? Isosorbide  2. Which pharmacy is medication to be sent to?[facility] on [address]  3. Do they need a 30 day or 90 day supply? 30  4. Would they like a call back once the medication has been sent to the pharmacy? no

## 2015-03-21 NOTE — Telephone Encounter (Signed)
E-sent to pharmacy  #30 x 4 refill.

## 2015-03-29 ENCOUNTER — Other Ambulatory Visit: Payer: Self-pay

## 2015-03-29 DIAGNOSIS — G8918 Other acute postprocedural pain: Secondary | ICD-10-CM

## 2015-03-29 MED ORDER — HYDROCODONE-ACETAMINOPHEN 5-325 MG PO TABS: 1.0000 | ORAL_TABLET | Freq: Four times a day (QID) | ORAL | Status: DC | PRN

## 2015-03-29 NOTE — Telephone Encounter (Signed)
RX for Norco 5/325 mg 1 po every 8 hours prn for pain #40 no refills. Pt will pick up at front desk

## 2015-04-11 ENCOUNTER — Telehealth: Payer: Self-pay | Admitting: Cardiovascular Disease

## 2015-04-11 NOTE — Telephone Encounter (Signed)
Mr. Isaac Ray is calling because he is having some pain in his Abdomen and having trouble going to the bathroom .Marland Kitchen Please call. Will need some one to translate .( Spanish)

## 2015-04-11 NOTE — Telephone Encounter (Addendum)
Spoke with pt in spanish by [place], cma in the office, patient is having trouble with constipation. He was given the names of dulcolax supp, colace and warm prune juice to use. He is also asking about a prescription for pain med. Patient will try using tylenol for pain control. Pt agreed with this plan.

## 2015-04-19 ENCOUNTER — Ambulatory Visit (INDEPENDENT_AMBULATORY_CARE_PROVIDER_SITE_OTHER): Payer: Self-pay | Admitting: Thoracic Surgery (Cardiothoracic Vascular Surgery)

## 2015-04-19 ENCOUNTER — Encounter: Payer: Self-pay | Admitting: Thoracic Surgery (Cardiothoracic Vascular Surgery)

## 2015-04-19 VITALS — BP 103/63 | HR 53 | Resp 16 | Ht 67.0 in | Wt 171.0 lb

## 2015-04-19 DIAGNOSIS — Z951 Presence of aortocoronary bypass graft: Secondary | ICD-10-CM

## 2015-04-19 DIAGNOSIS — I2510 Atherosclerotic heart disease of native coronary artery without angina pectoris: Secondary | ICD-10-CM

## 2015-04-19 DIAGNOSIS — G8918 Other acute postprocedural pain: Secondary | ICD-10-CM

## 2015-04-19 MED ORDER — PANTOPRAZOLE SODIUM 40 MG PO TBEC: 40.0000 mg | DELAYED_RELEASE_TABLET | Freq: Every day | ORAL | Status: DC

## 2015-04-19 MED ORDER — NITROGLYCERIN 0.4 MG SL SUBL: 0.4000 mg | SUBLINGUAL_TABLET | SUBLINGUAL | Status: DC | PRN

## 2015-04-19 MED ORDER — HYDROCODONE-ACETAMINOPHEN 5-325 MG PO TABS: 1.0000 | ORAL_TABLET | Freq: Four times a day (QID) | ORAL | Status: DC | PRN

## 2015-04-19 NOTE — Progress Notes (Signed)
[address]       [place] [address]             [phone]       HPI:  [name] returns today with a chief complaint of chest pain.  History was obtained with a Spanish interpreter.  He has a 44 year old man who had coronary bypass grafting 5 on April 20 for severe three-vessel disease with ischemic cardiomyopathy and an ejection fraction of 25%. He did extremely well in early postoperative period. He was seen in the office on 03/15/2015. At that time he was making good progress and was not complaining of any chest pain.  Today he says that he has been having sensation of abdominal bloating and constipation. He also complains of pain in his chest and upper arms. This is sometimes exertional and sometimes occurs randomly. It can last for up to an hour. He is not accompanied by nausea, diaphoresis, or shortness of breath. He does not have the pain every day but on average has it about once a day. He says the pain is usually resolved by the time his hydrocodone starts to work.  Past Medical History  Diagnosis Date  . Essential hypertension   . Type 2 diabetes mellitus   . Hyperlipidemia   . CAD (coronary artery disease)      Current Outpatient Prescriptions  Medication Sig Dispense Refill  . aspirin 325 MG tablet Take 325 mg by mouth daily.    Marland Kitchen atorvastatin (LIPITOR) 80 MG tablet Take 1 tablet (80 mg total) by mouth daily at 6 PM. 30 tablet 1  . carvedilol (COREG) 3.125 MG tablet Take 1 tablet (3.125 mg total) by mouth 2 (two) times daily. 180 tablet 3  . glipiZIDE (GLUCOTROL XL) 10 MG 24 hr tablet Take 1 tablet (10 mg total) by mouth daily with breakfast. 30 tablet 1  . HYDROcodone-acetaminophen (NORCO/VICODIN) 5-325 MG per tablet Take 1-2 tablets by mouth every 6 (six) hours as needed for moderate pain or severe pain. 40 tablet 0  . isosorbide mononitrate (IMDUR) 30 MG 24 hr tablet Take 1 tablet (30 mg total) by mouth daily. 30 tablet 3  . metFORMIN  (GLUCOPHAGE) 1000 MG tablet Take 1 tablet (1,000 mg total) by mouth 2 (two) times daily with a meal. 60 tablet 1  . nitroGLYCERIN (NITROSTAT) 0.4 MG SL tablet Place 1 tablet (0.4 mg total) under the tongue every 5 (five) minutes as needed for chest pain. 60 tablet 12  . pantoprazole (PROTONIX) 40 MG tablet Take 1 tablet (40 mg total) by mouth daily. 30 tablet 0   No current facility-administered medications for this visit.    Physical Exam BP 103/63 mmHg  Pulse 53  Resp 16  Ht 5\' 7"  (1.702 m)  Wt 171 lb (77.565 kg)  BMI 26.78 kg/m2  SpO57 52% 44 year old man in no acute distress Well-developed well-nourished Alert and oriented x 3 with no focal deficits Lungs clear with equal breath sounds bilaterally Cardiac regular rate and rhythm normal S1 and S2, positive S4, no rub  Diagnostic Tests: none  Impression: 44 year old man who is now about 2 months out from coronary bypass grafting 5 for severe three-vessel disease with ischemic cardiomyopathy.  He is having chest pain that is suspicious for angina. It's not entirely typical but it is concerning. I'm going to give him a prescription for nitroglycerin 0.4 mg tablets sublingual when necessary for chest pain. When he has the pain he will  try that to see if it provides any relief. I'm going to send him back to Dr. Allyson Sabal to see if a stress test or repeat catheterization as necessary.  He also has a component with lying flat on his back. Possible he could be having some reflux as well. I think that is less likely. I'll give him a prescription for Protonix 40 mg daily just in case.  I gave him a new prescription for hydrocodone acetaminophen 5/325 one to 2 tablets 4 times daily as needed for pain.  I did inform him and his family that if he were to have this pain and it is not relieved by nitroglycerin, he should call EMS.  Plan: Follow-up with Dr. Allyson Sabal  I will see him back in 2 weeks to check on his progress.   [name], MD [facility] [phone]

## 2015-04-20 ENCOUNTER — Ambulatory Visit (INDEPENDENT_AMBULATORY_CARE_PROVIDER_SITE_OTHER): Payer: Self-pay | Admitting: Cardiovascular Disease

## 2015-04-20 ENCOUNTER — Encounter: Payer: Self-pay | Admitting: Cardiovascular Disease

## 2015-04-20 VITALS — BP 118/84 | HR 77 | Ht 67.0 in | Wt 186.0 lb

## 2015-04-20 DIAGNOSIS — E119 Type 2 diabetes mellitus without complications: Secondary | ICD-10-CM | POA: Insufficient documentation

## 2015-04-20 DIAGNOSIS — I214 Non-ST elevation (NSTEMI) myocardial infarction: Secondary | ICD-10-CM

## 2015-04-20 DIAGNOSIS — I10 Essential (primary) hypertension: Secondary | ICD-10-CM | POA: Insufficient documentation

## 2015-04-20 DIAGNOSIS — Z951 Presence of aortocoronary bypass graft: Secondary | ICD-10-CM

## 2015-04-20 DIAGNOSIS — E785 Hyperlipidemia, unspecified: Secondary | ICD-10-CM | POA: Insufficient documentation

## 2015-04-20 NOTE — Assessment & Plan Note (Signed)
History of hypertension with blood pressure measured at 118/84. He is on carvedilol and isosorbide mononitrate. Continue current meds at current dosing

## 2015-04-20 NOTE — Patient Instructions (Addendum)
  Medication Instructions:   Continue your current medications  Labwork:  n/a  Testing/Procedures:  Lexiscan Myoview- this is a test that looks at the blood flow to your heart muscle.  It takes approximately 2 1/2 hours. Please follow instruction sheet, as given.   Echocardiogram. Echocardiography is a painless test that uses sound waves to create images of your heart. It provides your doctor with information about the size and shape of your heart and how well your heart's chambers and valves are working. This procedure takes approximately one hour. There are no restrictions for this procedure.   Follow-Up:  3 months with an extender  6 months with Dr Allyson Sabal  Any Other Special Instructions Will Be Listed Below (If Applicable).

## 2015-04-20 NOTE — Assessment & Plan Note (Addendum)
Isaac Ray is a 44 year old mildly overweight married Latino male who is accompanied by his wife and daughter today. He had a non-STEMI/18/16 and underwent cardiac catheterization by myself radially revealing three-vessel disease with moderately severe LV dysfunction, EF 25-30% with severe anteroapical and inferobasal hypokinesia. 2 days later he underwent coronary artery bypass grafting with 5 grafts placed by Dr. Dorris Fetch (LIMA to LAD, sequential vein to OM1 and O2, sequential left radial artery to D1 and D2.). He's had new onset nocturnal chest tightness similar to his preintervention symptoms. I'm going to get a 2-D echo to establish whether or not he's had return of LV function in addition to a form of logic Myoview stress test. We can consider titrating his carvedilol and adding a low-dose ACE inhibitor.

## 2015-04-20 NOTE — Progress Notes (Signed)
04/20/2015 Isaac Ray   06-May-1971  [id number]  Primary Physician No PCP Per Patient Primary Cardiologist: Runell Gess MD Roseanne Reno   HPI:  Mr. Isaac Ray is a 44 year old married Latino male with a prior history of CAD status post stenting in 2013 at [facility] in [place]. He has a history of treated hypertension, diabetes hyperlipidemia and continued tobacco abuse. He was admitted with acute coronary syndrome and had minimally elevated enzymes with nonspecific ST and T-wave changes. I performed a diagnostic coronary arteriography via the right radial approach revealing three-vessel disease with severe LV dysfunction. 2 days later he underwent coronary artery bypass grafting by Dr. Dorris Fetch with 5 grafts placed including a LIMA to the LAD, sequential vein to OM1 and 2 and sequential left radial to diagonal branch 1 and 2. He did apparently stop smoking. A month after discharge he developed nocturnal chest pressure similar to his preintervention symptoms.  Current Outpatient Prescriptions  Medication Sig Dispense Refill  . aspirin 325 MG tablet Take 325 mg by mouth daily.    Marland Kitchen atorvastatin (LIPITOR) 80 MG tablet Take 1 tablet (80 mg total) by mouth daily at 6 PM. 30 tablet 1  . carvedilol (COREG) 3.125 MG tablet Take 1 tablet (3.125 mg total) by mouth 2 (two) times daily. 180 tablet 3  . glipiZIDE (GLUCOTROL XL) 10 MG 24 hr tablet Take 1 tablet (10 mg total) by mouth daily with breakfast. 30 tablet 1  . HYDROcodone-acetaminophen (NORCO/VICODIN) 5-325 MG per tablet Take 1-2 tablets by mouth every 6 (six) hours as needed for moderate pain or severe pain. 40 tablet 0  . isosorbide mononitrate (IMDUR) 30 MG 24 hr tablet Take 1 tablet (30 mg total) by mouth daily. 30 tablet 3  . metFORMIN (GLUCOPHAGE) 1000 MG tablet Take 1 tablet (1,000 mg total) by mouth 2 (two) times daily with a meal. 60 tablet 1  . nitroGLYCERIN (NITROSTAT) 0.4 MG SL tablet Place 1  tablet (0.4 mg total) under the tongue every 5 (five) minutes as needed for chest pain. 60 tablet 12  . pantoprazole (PROTONIX) 40 MG tablet Take 1 tablet (40 mg total) by mouth daily. 30 tablet 0   No current facility-administered medications for this visit.    No Known Allergies  History   Social History  . Marital Status: Single    Spouse Name: N/A  . Number of Children: N/A  . Years of Education: N/A   Occupational History  . Not on file.   Social History Main Topics  . Smoking status: Former Smoker    Quit date: 02/16/2015  . Smokeless tobacco: Not on file  . Alcohol Use: No  . Drug Use: No  . Sexual Activity: Not on file   Other Topics Concern  . Not on file   Social History Narrative     Review of Systems: General: negative for chills, fever, night sweats or weight changes.  Cardiovascular: negative for chest pain, dyspnea on exertion, edema, orthopnea, palpitations, paroxysmal nocturnal dyspnea or shortness of breath Dermatological: negative for rash Respiratory: negative for cough or wheezing Urologic: negative for hematuria Abdominal: negative for nausea, vomiting, diarrhea, bright red blood per rectum, melena, or hematemesis Neurologic: negative for visual changes, syncope, or dizziness All other systems reviewed and are otherwise negative except as noted above.    Blood pressure 118/84, pulse 77, height 5\' 7"  (1.702 m), weight 186 lb (84.369 kg).  General appearance: alert and no distress Neck: no adenopathy,  no carotid bruit, no JVD, supple, symmetrical, trachea midline and thyroid not enlarged, symmetric, no tenderness/mass/nodules Lungs: clear to auscultation bilaterally Heart: regular rate and rhythm, S1, S2 normal, no murmur, click, rub or gallop Extremities: extremities normal, atraumatic, no cyanosis or edema  EKG normal/77 with a nonspecific IVCD. I personally reviewed this EKG  ASSESSMENT AND PLAN:   NSTEMI (non-ST elevated myocardial  infarction) Mr. Isaac Ray is a 44 year old mildly overweight married Latino male who is accompanied by his wife and daughter today. He had a non-STEMI/18/16 and underwent cardiac catheterization by myself radially revealing three-vessel disease with moderately severe LV dysfunction, EF 25-30% with severe anteroapical and inferobasal hypokinesia. 2 days later he underwent coronary artery bypass grafting with 5 grafts placed by Dr. Dorris Fetch (LIMA to LAD, sequential vein to OM1 and O2, sequential left radial artery to D1 and D2.). He's had new onset nocturnal chest tightness similar to his preintervention symptoms. I'm going to get a 2-D echo to establish whether or not he's had return of LV function in addition to a form of logic Myoview stress test. We can consider titrating his carvedilol and adding a low-dose ACE inhibitor.  Hyperlipidemia History of hyperlipidemia on atorvastatin 80 mg a day with his most recent lipid profile performed 02/12/15 revealing a total cholesterol 158, LDL 98 and HDL of 26.  Essential hypertension History of hypertension with blood pressure measured at 118/84. He is on carvedilol and isosorbide mononitrate. Continue current meds at current dosing      Runell Gess MD [facility], [facility] 04/20/2015 3:05 PM

## 2015-04-20 NOTE — Assessment & Plan Note (Signed)
History of hyperlipidemia on atorvastatin 80 mg a day with his most recent lipid profile performed 02/12/15 revealing a total cholesterol 158, LDL 98 and HDL of 26.

## 2015-04-27 ENCOUNTER — Other Ambulatory Visit: Payer: Self-pay | Admitting: Thoracic Surgery (Cardiothoracic Vascular Surgery)

## 2015-04-27 DIAGNOSIS — Z951 Presence of aortocoronary bypass graft: Secondary | ICD-10-CM

## 2015-05-03 ENCOUNTER — Encounter: Payer: Self-pay | Admitting: Thoracic Surgery (Cardiothoracic Vascular Surgery)

## 2015-05-03 ENCOUNTER — Ambulatory Visit
Admission: RE | Admit: 2015-05-03 | Discharge: 2015-05-03 | Disposition: A | Discharge status: Home / Self Care | Payer: No Typology Code available for payment source | Source: Ambulatory Visit | Attending: Thoracic Surgery (Cardiothoracic Vascular Surgery) | Admitting: Thoracic Surgery (Cardiothoracic Vascular Surgery)

## 2015-05-03 ENCOUNTER — Other Ambulatory Visit: Payer: Self-pay | Admitting: *Deleted

## 2015-05-03 ENCOUNTER — Ambulatory Visit (INDEPENDENT_AMBULATORY_CARE_PROVIDER_SITE_OTHER): Payer: Self-pay | Admitting: Thoracic Surgery (Cardiothoracic Vascular Surgery)

## 2015-05-03 ENCOUNTER — Telehealth (HOSPITAL_COMMUNITY): Payer: Self-pay

## 2015-05-03 VITALS — BP 116/72 | HR 73 | Resp 20 | Ht 67.0 in | Wt 186.0 lb

## 2015-05-03 DIAGNOSIS — I255 Ischemic cardiomyopathy: Secondary | ICD-10-CM

## 2015-05-03 DIAGNOSIS — Z951 Presence of aortocoronary bypass graft: Secondary | ICD-10-CM

## 2015-05-03 DIAGNOSIS — I2510 Atherosclerotic heart disease of native coronary artery without angina pectoris: Secondary | ICD-10-CM

## 2015-05-03 DIAGNOSIS — E139 Other specified diabetes mellitus without complications: Secondary | ICD-10-CM

## 2015-05-03 MED ORDER — GLIPIZIDE ER 10 MG PO TB24: 10.0000 mg | ORAL_TABLET | Freq: Every day | ORAL | Status: DC

## 2015-05-03 NOTE — Progress Notes (Signed)
[address]       [place] [address]             [phone]       HPI: Mr. Isaac Ray returns today for follow-up of his chest pain.  History was obtained via a [place] interpreter.  He is a 44 year old man who had coronary bypass grafting 5 on April 20 for severe three-vessel disease with ischemic cardiomyopathy and an ejection fraction of 25%. He did extremely well in early postoperative period. He was seen in the office on 03/15/2015. At that time he was making good progress and was not complaining of any chest pain.  He returned 2 weeks ago with complaints of abdominal bloating and constipation and pain in his chest and upper arms.   Description from 04/19/2015- The chest pain was sometimes exertional and sometimes occured randomly. It can last for up to an hour. He is not accompanied by nausea, diaphoresis, or shortness of breath. He does not have the pain every day but on average has it about once a day. He says the pain is usually resolved by the time his hydrocodone starts to work.  I did start him on proton X for possible reflux while waiting for him to see Dr. Allyson Sabal. He saw Dr. Allyson Sabal and is scheduled to have an echocardiogram and stress test later this week.  Today he says that he's had no episodes of pain since I saw him 2 weeks ago.  Past Medical History  Diagnosis Date  . Essential hypertension   . Type 2 diabetes mellitus   . Hyperlipidemia   . CAD (coronary artery disease)      Current Outpatient Prescriptions  Medication Sig Dispense Refill  . aspirin 325 MG tablet Take 325 mg by mouth daily.    Marland Kitchen atorvastatin (LIPITOR) 80 MG tablet Take 1 tablet (80 mg total) by mouth daily at 6 PM. 30 tablet 1  . carvedilol (COREG) 3.125 MG tablet Take 1 tablet (3.125 mg total) by mouth 2 (two) times daily. 180 tablet 3  . glipiZIDE (GLUCOTROL XL) 10 MG 24 hr tablet Take 1 tablet (10 mg total) by mouth daily with breakfast. 30 tablet 5  . isosorbide  mononitrate (IMDUR) 30 MG 24 hr tablet Take 1 tablet (30 mg total) by mouth daily. 30 tablet 3  . metFORMIN (GLUCOPHAGE) 1000 MG tablet Take 1 tablet (1,000 mg total) by mouth 2 (two) times daily with a meal. 60 tablet 1  . nitroGLYCERIN (NITROSTAT) 0.4 MG SL tablet Place 1 tablet (0.4 mg total) under the tongue every 5 (five) minutes as needed for chest pain. 60 tablet 12  . pantoprazole (PROTONIX) 40 MG tablet Take 1 tablet (40 mg total) by mouth daily. 30 tablet 0   No current facility-administered medications for this visit.    Physical Exam  BP 116/72 mmHg  Pulse 73  Resp 20  Ht  (1.702 m)  Wt 186 lb (84.369 kg)  BMI 29.12 kg/m2  SpO21 40% 44 year old man in no acute distress Alert and oriented 3 with no focal deficits Incision clean dry and intact, sternum stable No peripheral edema Cardiac regular rate and rhythm normal S1 and S2 Lungs clear with equal breath sounds bilaterally  Diagnostic Tests: none  Impression: 44 year old man with severe three-vessel coronary disease with ischemic cardiomyopathy who had coronary bypass grafting 5 almost 3 months ago. 2 weeks ago he presented with complaints of chest pain which were very similar to  his pains when he presented with a non-ST elevation MI.  In the interim since his last visit his pain has resolved. It may be that he was having reflux in the protonix has taking care of that.  I do think it would be best to go ahead and do his echocardiogram and stress test just to be on the safe side given that he did have severe diffuse coronary disease and is at an increased risk for early graft failure.  Plan: Echocardiogram and stress test as scheduled  Follow-up with Dr. Allyson Sabal to discuss results  [name], MD [facility] [phone]

## 2015-05-03 NOTE — Telephone Encounter (Signed)
Encounter complete. 

## 2015-05-04 ENCOUNTER — Ambulatory Visit (HOSPITAL_COMMUNITY)
Admission: RE | Admit: 2015-05-04 | Discharge: 2015-05-04 | Disposition: A | Discharge status: Home / Self Care | Payer: Self-pay | Source: Ambulatory Visit | Attending: Cardiovascular Disease | Admitting: Cardiovascular Disease

## 2015-05-04 ENCOUNTER — Ambulatory Visit (HOSPITAL_COMMUNITY): Admission: RE | Admit: 2015-05-04 | Payer: Self-pay | Source: Ambulatory Visit

## 2015-05-04 DIAGNOSIS — I214 Non-ST elevation (NSTEMI) myocardial infarction: Secondary | ICD-10-CM

## 2015-05-04 DIAGNOSIS — Z951 Presence of aortocoronary bypass graft: Secondary | ICD-10-CM

## 2015-05-05 ENCOUNTER — Other Ambulatory Visit (HOSPITAL_COMMUNITY): Payer: Self-pay

## 2015-05-05 ENCOUNTER — Ambulatory Visit (HOSPITAL_COMMUNITY)
Admission: RE | Admit: 2015-05-05 | Discharge: 2015-05-05 | Disposition: A | Discharge status: Home / Self Care | Payer: Medicaid Other | Source: Ambulatory Visit | Attending: Cardiology | Admitting: Cardiology

## 2015-05-05 ENCOUNTER — Other Ambulatory Visit: Payer: Self-pay | Admitting: Cardiology

## 2015-05-05 DIAGNOSIS — I517 Cardiomegaly: Secondary | ICD-10-CM | POA: Insufficient documentation

## 2015-05-05 DIAGNOSIS — Z951 Presence of aortocoronary bypass graft: Secondary | ICD-10-CM

## 2015-05-05 DIAGNOSIS — R079 Chest pain, unspecified: Secondary | ICD-10-CM | POA: Diagnosis present

## 2015-05-05 DIAGNOSIS — I214 Non-ST elevation (NSTEMI) myocardial infarction: Secondary | ICD-10-CM

## 2015-05-05 MED ORDER — PERFLUTREN LIPID MICROSPHERE: 1.5000 mL | INTRAVENOUS | Status: AC | PRN

## 2015-05-06 ENCOUNTER — Encounter: Payer: Self-pay | Admitting: *Deleted

## 2015-05-10 ENCOUNTER — Ambulatory Visit: Payer: Self-pay | Admitting: Cardiovascular Disease

## 2015-05-13 ENCOUNTER — Encounter (HOSPITAL_COMMUNITY): Payer: Self-pay

## 2015-05-17 ENCOUNTER — Telehealth (HOSPITAL_COMMUNITY): Payer: Self-pay

## 2015-05-17 NOTE — Telephone Encounter (Signed)
Encounter complete. 

## 2015-05-19 ENCOUNTER — Ambulatory Visit (HOSPITAL_COMMUNITY): Admission: RE | Admit: 2015-05-19 | Payer: Self-pay | Source: Ambulatory Visit

## 2015-06-01 ENCOUNTER — Inpatient Hospital Stay (HOSPITAL_COMMUNITY): Admission: RE | Admit: 2015-06-01 | Payer: Self-pay | Source: Ambulatory Visit

## 2015-07-14 DIAGNOSIS — Z736 Limitation of activities due to disability: Secondary | ICD-10-CM

## 2015-10-25 ENCOUNTER — Telehealth: Payer: Self-pay | Admitting: Cardiovascular Disease

## 2015-10-25 DIAGNOSIS — E139 Other specified diabetes mellitus without complications: Secondary | ICD-10-CM

## 2015-10-25 MED ORDER — ATORVASTATIN CALCIUM 80 MG PO TABS: 80.0000 mg | ORAL_TABLET | Freq: Every day | ORAL | Status: DC

## 2015-10-25 MED ORDER — CARVEDILOL 3.125 MG PO TABS: 3.1250 mg | ORAL_TABLET | Freq: Two times a day (BID) | ORAL | Status: DC

## 2015-10-25 MED ORDER — ISOSORBIDE MONONITRATE ER 30 MG PO TB24: 30.0000 mg | ORAL_TABLET | Freq: Every day | ORAL | Status: DC

## 2015-10-25 NOTE — Telephone Encounter (Signed)
SPOKE TO NIECE OF PATIENT  SHE STATES HE ASKED HER TO CALL IN FOR REFILL  INFORMED NIECE PATIENT HAS MISSED APPOINTMENTS - NEED AN OFFICE VISIT FOR CONTINUING REFILLS.  E SENT REFILL FOR CARVEDILOL,ATORVASTATIN,ISOSORBIDE FOR 2 MONTH REFILL RN INFORMED NIECE THAT METFORMIN AND GLIPIZIDE WILL NEED TO REFILLED BY PRIMARY OR DOCTOR WHO IS FOLLOWING DIABETIC MEDICATION   NIECE STATED PATIENT DOES HAVE PRIMARY- WILL CALL THEM FOR REFILL OF OTHER MEDICATIONS  AN APPOINTMENT MADE FOR 12/13/15 WITH Dr Allyson Sabal  NIECE VERBALIZED UNDERSTANDING.

## 2015-10-25 NOTE — Telephone Encounter (Signed)
°*  STAT* If patient is at the pharmacy, call can be transferred to refill team.   1. Which medications need to be refilled? (please list name of each medication and dose if known) Atorvastatin, Carvedilol, Glipizide, Isosorbide, Metformin   2. Which pharmacy/location (including street and city if local pharmacy) is medication to be sent to? [facility] on [address]  3. Do they need a 30 day or 90 day supply? 90

## 2015-12-13 ENCOUNTER — Ambulatory Visit: Payer: Medicaid Other | Admitting: Cardiovascular Disease

## 2015-12-14 ENCOUNTER — Ambulatory Visit: Payer: Medicaid Other | Admitting: Cardiovascular Disease

## 2016-11-28 ENCOUNTER — Encounter (HOSPITAL_COMMUNITY): Payer: Self-pay

## 2016-11-28 ENCOUNTER — Emergency Department (HOSPITAL_COMMUNITY)
Admission: EM | Admit: 2016-11-28 | Discharge: 2016-11-28 | Disposition: A | Discharge status: Home / Self Care | Payer: Medicaid Other | Attending: Emergency Medicine | Admitting: Emergency Medicine

## 2016-11-28 DIAGNOSIS — I2510 Atherosclerotic heart disease of native coronary artery without angina pectoris: Secondary | ICD-10-CM | POA: Insufficient documentation

## 2016-11-28 DIAGNOSIS — I10 Essential (primary) hypertension: Secondary | ICD-10-CM | POA: Insufficient documentation

## 2016-11-28 DIAGNOSIS — Z7982 Long term (current) use of aspirin: Secondary | ICD-10-CM | POA: Insufficient documentation

## 2016-11-28 DIAGNOSIS — I252 Old myocardial infarction: Secondary | ICD-10-CM | POA: Insufficient documentation

## 2016-11-28 DIAGNOSIS — X58XXXA Exposure to other specified factors, initial encounter: Secondary | ICD-10-CM | POA: Insufficient documentation

## 2016-11-28 DIAGNOSIS — Z87891 Personal history of nicotine dependence: Secondary | ICD-10-CM | POA: Insufficient documentation

## 2016-11-28 DIAGNOSIS — Z951 Presence of aortocoronary bypass graft: Secondary | ICD-10-CM | POA: Insufficient documentation

## 2016-11-28 DIAGNOSIS — Y929 Unspecified place or not applicable: Secondary | ICD-10-CM | POA: Insufficient documentation

## 2016-11-28 DIAGNOSIS — Y999 Unspecified external cause status: Secondary | ICD-10-CM | POA: Insufficient documentation

## 2016-11-28 DIAGNOSIS — Z7984 Long term (current) use of oral hypoglycemic drugs: Secondary | ICD-10-CM | POA: Insufficient documentation

## 2016-11-28 DIAGNOSIS — S90112A Contusion of left great toe without damage to nail, initial encounter: Secondary | ICD-10-CM | POA: Insufficient documentation

## 2016-11-28 DIAGNOSIS — Y939 Activity, unspecified: Secondary | ICD-10-CM | POA: Insufficient documentation

## 2016-11-28 DIAGNOSIS — E119 Type 2 diabetes mellitus without complications: Secondary | ICD-10-CM | POA: Insufficient documentation

## 2016-11-28 DIAGNOSIS — M79605 Pain in left leg: Secondary | ICD-10-CM

## 2016-11-28 DIAGNOSIS — Z955 Presence of coronary angioplasty implant and graft: Secondary | ICD-10-CM | POA: Insufficient documentation

## 2016-11-28 DIAGNOSIS — Z79899 Other long term (current) drug therapy: Secondary | ICD-10-CM | POA: Insufficient documentation

## 2016-11-28 LAB — CBC WITH DIFFERENTIAL/PLATELET
BASOS PCT: 0 %
Basophils Absolute: 0 10*3/uL (ref 0.0–0.1)
EOS ABS: 0.2 10*3/uL (ref 0.0–0.7)
Eosinophils Relative: 2 %
HEMATOCRIT: 40.1 % (ref 39.0–52.0)
HEMOGLOBIN: 14 g/dL (ref 13.0–17.0)
Lymphocytes Relative: 53 %
Lymphs Abs: 5.1 10*3/uL — ABNORMAL HIGH (ref 0.7–4.0)
MCH: 31.2 pg (ref 26.0–34.0)
MCHC: 34.9 g/dL (ref 30.0–36.0)
MCV: 89.3 fL (ref 78.0–100.0)
MONO ABS: 0.6 10*3/uL (ref 0.1–1.0)
MONOS PCT: 6 %
NEUTROS ABS: 3.7 10*3/uL (ref 1.7–7.7)
NEUTROS PCT: 39 %
Platelets: 178 10*3/uL (ref 150–400)
RBC: 4.49 MIL/uL (ref 4.22–5.81)
RDW: 13.3 % (ref 11.5–15.5)
WBC: 9.6 10*3/uL (ref 4.0–10.5)

## 2016-11-28 LAB — BASIC METABOLIC PANEL
ANION GAP: 9 (ref 5–15)
BUN: 13 mg/dL (ref 6–20)
CO2: 22 mmol/L (ref 22–32)
CREATININE: 1.06 mg/dL (ref 0.61–1.24)
Calcium: 9.3 mg/dL (ref 8.9–10.3)
Chloride: 108 mmol/L (ref 101–111)
Glucose, Bld: 128 mg/dL — ABNORMAL HIGH (ref 65–99)
Potassium: 3.9 mmol/L (ref 3.5–5.1)
SODIUM: 139 mmol/L (ref 135–145)

## 2016-11-28 LAB — PROTIME-INR
INR: 1.05
PROTHROMBIN TIME: 13.7 s (ref 11.4–15.2)

## 2016-11-28 NOTE — ED Provider Notes (Signed)
[facility]-EMERGENCY DEPT Provider Note   CSN: [id number] Arrival date & time: 11/28/16  1802     History   Chief Complaint Chief Complaint  Patient presents with  . Leg Pain    HPI Isaac Ray is a 46 y.o. male.  The history is provided by the patient. A language interpreter was used.   Isaac Ray is a 46 y.o. male who presents to the Emergency Department complaining of leg pain.  He reports pain to the left calf and foot for the last 2-3 days. Pain has been gradual in onset with no reported injuries. He also reports bruising to the base of his left foot. Pain in the leg is worse with ambulation. He has no associated chest pain, shortness of breath, fevers, nausea, vomiting. He has a history of heart disease, hypertension, diabetes but does not take any medications.  Past Medical History:  Diagnosis Date  . CAD (coronary artery disease)   . Essential hypertension   . Hyperlipidemia   . Type 2 diabetes mellitus [facility])     Patient Active Problem List   Diagnosis Date Noted  . Essential hypertension 04/20/2015  . Hyperlipidemia 04/20/2015  . Diabetes (HCC) 04/20/2015  . 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 NSTEMI (non-ST elevated myocardial infarction) (HCC) 02/12/2015    Past Surgical History:  Procedure Laterality Date  . CORONARY ANGIOPLASTY WITH STENT PLACEMENT  2013  . CORONARY ARTERY BYPASS GRAFT N/A 02/16/2015   Procedure: CORONARY ARTERY BYPASS GRAFTING (CABG) x5 using left internal mammary artery, right thigh greater saphenous vein, and left radial artery. ;  Surgeon: [name], MD;  Location: [facility] OR;  Service: Open Heart Surgery;  Laterality: N/A;  . LEFT HEART CATHETERIZATION WITH CORONARY ANGIOGRAM N/A 02/14/2015   Procedure: LEFT HEART CATHETERIZATION WITH CORONARY ANGIOGRAM;  Surgeon: Runell Gess, MD;  Location: [facility] CATH LAB;  Service: Cardiovascular;  Laterality: N/A;  . RADIAL ARTERY HARVEST Left 02/16/2015   Procedure: RADIAL ARTERY HARVEST;  Surgeon:  [name], MD;  Location: [facility] OR;  Service: Open Heart Surgery;  Laterality: Left;  . TEE WITHOUT CARDIOVERSION N/A 02/16/2015   Procedure: TRANSESOPHAGEAL ECHOCARDIOGRAM (TEE);  Surgeon: [name], MD;  Location: [facility] OR;  Service: Open Heart Surgery;  Laterality: N/A;       Home Medications    Prior to Admission medications   Medication Sig Start Date End Date Taking? Authorizing Provider  aspirin 325 MG tablet Take 325 mg by mouth daily.   Yes Historical Provider, MD  atorvastatin (LIPITOR) 80 MG tablet Take 1 tablet (80 mg total) by mouth daily at 6 PM. Patient not taking: Reported on 11/28/2016 10/25/15   Runell Gess, MD  carvedilol (COREG) 3.125 MG tablet Take 1 tablet (3.125 mg total) by mouth 2 (two) times daily. Patient not taking: Reported on 11/28/2016 10/25/15   Runell Gess, MD  glipiZIDE (GLUCOTROL XL) 10 MG 24 hr tablet Take 1 tablet (10 mg total) by mouth daily with breakfast. Patient not taking: Reported on 11/28/2016 05/03/15   [name], MD  isosorbide mononitrate (IMDUR) 30 MG 24 hr tablet Take 1 tablet (30 mg total) by mouth daily. Patient not taking: Reported on 11/28/2016 10/25/15   Runell Gess, MD  metFORMIN (GLUCOPHAGE) 1000 MG tablet Take 1 tablet (1,000 mg total) by mouth 2 (two) times daily with a meal. Patient not taking: Reported on 11/28/2016 02/20/15   Rowe Clack, PA-C  nitroGLYCERIN (NITROSTAT) 0.4 MG SL  tablet Place 1 tablet (0.4 mg total) under the tongue every 5 (five) minutes as needed for chest pain. Patient not taking: Reported on 11/28/2016 04/19/15   [name], MD  pantoprazole (PROTONIX) 40 MG tablet Take 1 tablet (40 mg total) by mouth daily. Patient not taking: Reported on 11/28/2016 04/19/15   [name], MD    Family History Family History  Problem Relation Age of Onset  . Coronary artery disease Father     had CABG    Social History Social History  Substance Use Topics  .  Smoking status: Former Smoker    Quit date: 02/16/2015  . Smokeless tobacco: Never Used  . Alcohol use No     Allergies   Patient has no known allergies.   Review of Systems Review of Systems  All other systems reviewed and are negative.    Physical Exam Updated Vital Signs BP 111/72   Pulse (!) 55   Temp 98.3 F (36.8 C) (Oral)   Resp 22   SpO2 90%   Physical Exam  Constitutional: He is oriented to person, place, and time. He appears well-developed and well-nourished.  HENT:  Head: Normocephalic and atraumatic.  Cardiovascular: Normal rate and regular rhythm.   No murmur heard. Pulmonary/Chest: Effort normal and breath sounds normal. No respiratory distress.  Abdominal: Soft. There is no tenderness. There is no rebound and no guarding.  Musculoskeletal:  2+ DP pulses bilaterally. Few small areas of ecchymosis to the left great toe as well as the lateral aspects of the sole of the foot. There is no significant tenderness or swelling in this area. Mild left calf tenderness.  Neurological: He is alert and oriented to person, place, and time.  Skin: Skin is warm and dry.  Psychiatric: He has a normal mood and affect. His behavior is normal.  Nursing note and vitals reviewed.    ED Treatments / Results  Labs (all labs ordered are listed, but only abnormal results are displayed) Labs Reviewed  BASIC METABOLIC PANEL - Abnormal; Notable for the following:       Result Value   Glucose, Bld 128 (*)    All other components within normal limits  CBC WITH DIFFERENTIAL/PLATELET - Abnormal; Notable for the following:    Lymphs Abs 5.1 (*)    All other components within normal limits  PROTIME-INR  RPR  HIV ANTIBODY (ROUTINE TESTING)    EKG  EKG Interpretation None       Radiology No results found.  Procedures Procedures (including critical care time)  Medications Ordered in ED Medications - No data to display   Initial Impression / Assessment and Plan / ED  Course  I have reviewed the triage vital signs and the nursing notes.  Pertinent labs & imaging results that were available during my care of the patient were reviewed by me and considered in my medical decision making (see chart for details).     Patient with history of heart disease status post CABG here with left calf pain and foot pain. He is neurovascularly intact on examination. No murmur on heart exam. Clinical picture is not consistent with acute limb ischemia, dissection, embolus. Cannot obtain a vascular ultrasound of the leg at this time. Discussed with patient recommendation to return in the morning for vascular ultrasound of the left leg. Also discussed with patient importance of PCP follow-up to continue his home medications as well as to reassess his foot/leg.  Final Clinical Impressions(s) / ED Diagnoses  Final diagnoses:  Left leg pain    New Prescriptions Discharge Medication List as of 11/28/2016 11:46 PM       Tilden Fossa, MD 11/29/16 0145

## 2016-11-29 ENCOUNTER — Ambulatory Visit (HOSPITAL_COMMUNITY)
Admission: RE | Admit: 2016-11-29 | Discharge: 2016-11-29 | Disposition: A | Discharge status: Home / Self Care | Payer: Medicaid Other | Source: Ambulatory Visit | Attending: Emergency Medicine | Admitting: Emergency Medicine

## 2016-11-29 DIAGNOSIS — M7989 Other specified soft tissue disorders: Secondary | ICD-10-CM | POA: Insufficient documentation

## 2016-11-29 DIAGNOSIS — M79609 Pain in unspecified limb: Secondary | ICD-10-CM

## 2016-11-29 LAB — HIV ANTIBODY (ROUTINE TESTING W REFLEX): HIV Screen 4th Generation wRfx: NONREACTIVE

## 2016-11-29 LAB — RPR: RPR Ser Ql: NONREACTIVE

## 2016-11-29 NOTE — Progress Notes (Signed)
*  PRELIMINARY RESULTS* Vascular Ultrasound Left lower extremity venous duplex has been completed.  Preliminary findings:  No evidence of DVT or baker's cyst.     Farrel Demark, RDMS, RVT  11/29/2016, 9:32 AM

## 2016-12-05 ENCOUNTER — Ambulatory Visit: Payer: Medicaid Other | Attending: Internal Medicine | Admitting: Physician Assistant

## 2016-12-05 VITALS — BP 145/73 | HR 61 | Temp 97.7°F | Wt 189.8 lb

## 2016-12-05 DIAGNOSIS — E139 Other specified diabetes mellitus without complications: Secondary | ICD-10-CM

## 2016-12-05 DIAGNOSIS — E119 Type 2 diabetes mellitus without complications: Secondary | ICD-10-CM | POA: Insufficient documentation

## 2016-12-05 DIAGNOSIS — I771 Stricture of artery: Secondary | ICD-10-CM | POA: Diagnosis not present

## 2016-12-05 DIAGNOSIS — Z79899 Other long term (current) drug therapy: Secondary | ICD-10-CM | POA: Insufficient documentation

## 2016-12-05 DIAGNOSIS — E118 Type 2 diabetes mellitus with unspecified complications: Secondary | ICD-10-CM | POA: Diagnosis not present

## 2016-12-05 DIAGNOSIS — Z7982 Long term (current) use of aspirin: Secondary | ICD-10-CM | POA: Insufficient documentation

## 2016-12-05 DIAGNOSIS — I255 Ischemic cardiomyopathy: Secondary | ICD-10-CM | POA: Insufficient documentation

## 2016-12-05 DIAGNOSIS — I10 Essential (primary) hypertension: Secondary | ICD-10-CM | POA: Insufficient documentation

## 2016-12-05 DIAGNOSIS — E785 Hyperlipidemia, unspecified: Secondary | ICD-10-CM | POA: Insufficient documentation

## 2016-12-05 DIAGNOSIS — Z7984 Long term (current) use of oral hypoglycemic drugs: Secondary | ICD-10-CM | POA: Insufficient documentation

## 2016-12-05 DIAGNOSIS — Z951 Presence of aortocoronary bypass graft: Secondary | ICD-10-CM | POA: Insufficient documentation

## 2016-12-05 DIAGNOSIS — I2510 Atherosclerotic heart disease of native coronary artery without angina pectoris: Secondary | ICD-10-CM | POA: Insufficient documentation

## 2016-12-05 DIAGNOSIS — M79605 Pain in left leg: Secondary | ICD-10-CM | POA: Diagnosis not present

## 2016-12-05 LAB — GLUCOSE, POCT (MANUAL RESULT ENTRY): POC Glucose: 197 mg/dl — AB (ref 70–99)

## 2016-12-05 LAB — POCT GLYCOSYLATED HEMOGLOBIN (HGB A1C): Hemoglobin A1C: 8.6

## 2016-12-05 MED ORDER — ASPIRIN 325 MG PO TABS: 325.0000 mg | ORAL_TABLET | Freq: Every day | ORAL | 2 refills | Status: DC

## 2016-12-05 MED ORDER — CARVEDILOL 3.125 MG PO TABS
3.1250 mg | ORAL_TABLET | Freq: Two times a day (BID) | ORAL | 2 refills | Status: DC
Start: 2016-12-05 — End: 2017-01-02

## 2016-12-05 MED ORDER — METFORMIN HCL 1000 MG PO TABS: 1000.0000 mg | ORAL_TABLET | Freq: Two times a day (BID) | ORAL | 2 refills | Status: DC

## 2016-12-05 MED ORDER — GLIPIZIDE ER 10 MG PO TB24: 10.0000 mg | ORAL_TABLET | Freq: Every day | ORAL | 2 refills | Status: DC

## 2016-12-05 MED ORDER — ISOSORBIDE MONONITRATE ER 30 MG PO TB24: 30.0000 mg | ORAL_TABLET | Freq: Every day | ORAL | 2 refills | Status: DC

## 2016-12-05 MED ORDER — ATORVASTATIN CALCIUM 80 MG PO TABS: 80.0000 mg | ORAL_TABLET | Freq: Every day | ORAL | 2 refills | Status: DC

## 2016-12-05 NOTE — Progress Notes (Addendum)
Isaac Ray  ZOX:[id number]  WJX:[id number]  DOB - [birth year]  Chief Complaint  Patient presents with  . Hospitalization Follow-up       Subjective:   Isaac Ray is a 46 y.o. male here today for establishment of care. He has a past medical history of coronary artery disease status post 5 vessel coronary artery bypass grafting in April 2016, ischemic cardiomyopathy with ejection fraction 30-35%, hypertension, diabetes mellitus type 2, and dyslipidemia. He has been lost to primary care follow-up for at least 1 year. Somehow he has been getting medication sporadically. He presented to the emergency department on 11/28/2016 with left leg pain. It was mostly in the back of his calf and knee. His physical examination was benign. On that day they could not do an ultrasound he was told to return the following day. He did have a left lower extremity venous Doppler that was negative for DVT and negative for Baker's cyst. He was told to follow-up here. He did have a CBC, BMP, HIV, RPR and INR which were all negative.  Now he is having some discoloration in the great toe on the left and the pinky toe on the left. The pain in the back of his calf has improved. It is not limiting his walking. No injury. No trauma. No chest pain. No shortness of breath. Intermittently compliant with his medications.  ROS: GEN: denies fever or chills, denies change in weight Skin: denies lesions or rashes HEENT: denies headache, earache, epistaxis, sore throat, or neck pain LUNGS: denies SHOB, dyspnea, PND, orthopnea CV: denies CP or palpitations ABD: denies abd pain, N or V EXT: denies muscle spasms or swelling; + pain in lower ext, no weakness NEURO: denies numbness or tingling, denies sz, stroke or TIA  ALLERGIES: No Known Allergies  PAST MEDICAL HISTORY: Past Medical History:  Diagnosis Date  . CAD (coronary artery disease)   . Essential hypertension   . Hyperlipidemia   . Type 2 diabetes mellitus (HCC)      PAST SURGICAL HISTORY: Past Surgical History:  Procedure Laterality Date  . CORONARY ANGIOPLASTY WITH STENT PLACEMENT  2013  . CORONARY ARTERY BYPASS GRAFT N/A 02/16/2015   Procedure: CORONARY ARTERY BYPASS GRAFTING (CABG) x5 using left internal mammary artery, right thigh greater saphenous vein, and left radial artery. ;  Surgeon: [name], MD;  Location: [facility] OR;  Service: Open Heart Surgery;  Laterality: N/A;  . LEFT HEART CATHETERIZATION WITH CORONARY ANGIOGRAM N/A 02/14/2015   Procedure: LEFT HEART CATHETERIZATION WITH CORONARY ANGIOGRAM;  Surgeon: Runell Gess, MD;  Location: [facility] CATH LAB;  Service: Cardiovascular;  Laterality: N/A;  . RADIAL ARTERY HARVEST Left 02/16/2015   Procedure: RADIAL ARTERY HARVEST;  Surgeon: [name], MD;  Location: [facility] OR;  Service: Open Heart Surgery;  Laterality: Left;  . TEE WITHOUT CARDIOVERSION N/A 02/16/2015   Procedure: TRANSESOPHAGEAL ECHOCARDIOGRAM (TEE);  Surgeon: [name], MD;  Location: [facility] OR;  Service: Open Heart Surgery;  Laterality: N/A;    MEDICATIONS AT HOME: Prior to Admission medications   Medication Sig Start Date End Date Taking? Authorizing Provider  aspirin 325 MG tablet Take 1 tablet (325 mg total) by mouth daily. 12/05/16  Yes Tiffany Netta Cedars, PA-C  atorvastatin (LIPITOR) 80 MG tablet Take 1 tablet (80 mg total) by mouth daily at 6 PM. 12/05/16  Yes Tiffany Netta Cedars, PA-C  carvedilol (COREG) 3.125 MG tablet Take 1 tablet (3.125 mg total) by mouth 2 (two) times daily. 12/05/16  Yes  Vivianne Master, PA-C  glipiZIDE (GLUCOTROL XL) 10 MG 24 hr tablet Take 1 tablet (10 mg total) by mouth daily with breakfast. 12/05/16  Yes Tiffany Netta Cedars, PA-C  isosorbide mononitrate (IMDUR) 30 MG 24 hr tablet Take 1 tablet (30 mg total) by mouth daily. 12/05/16  Yes Tiffany Netta Cedars, PA-C  metFORMIN (GLUCOPHAGE) 1000 MG tablet Take 1 tablet (1,000 mg total) by mouth 2 (two) times daily with a meal. 12/05/16  Yes Tiffany Netta Cedars, PA-C    nitroGLYCERIN (NITROSTAT) 0.4 MG SL tablet Place 1 tablet (0.4 mg total) under the tongue every 5 (five) minutes as needed for chest pain. Patient not taking: Reported on 11/28/2016 04/19/15   [name], MD  pantoprazole (PROTONIX) 40 MG tablet Take 1 tablet (40 mg total) by mouth daily. Patient not taking: Reported on 11/28/2016 04/19/15   [name], MD     Objective:   Vitals:   12/05/16 1035  BP: (!) 145/73  Pulse: 61  Temp: 97.7 F (36.5 C)  TempSrc: Oral  SpO2: 98%  Weight: 189 lb 12.8 oz (86.1 kg)    Exam General appearance : Awake, alert, not in any distress. Speech Clear. Not toxic looking HEENT: Atraumatic and Normocephalic, pupils equally reactive to light and accomodation Neck: supple, no JVD. No cervical lymphadenopathy.  Chest:Good air entry bilaterally, no added sounds  CVS: S1 S2 regular, no murmurs.  Abdomen: Bowel sounds present, Non tender and not distended with no gaurding, rigidity or rebound. Extremities: B/L Lower Ext shows no edema, both legs are warm to touch with 2+PP but left great toe and pinky are purple/moduled -Homans sign Neurology: Awake alert, and oriented X 3, CN II-XII intact, Non focal Skin:No Rash Wounds:N/A  Data Review Lab Results  Component Value Date   HGBA1C 8.6 12/05/2016   HGBA1C 10.2 (H) 02/13/2015   HGBA1C 10.1 (H) 02/12/2015     Assessment & Plan  1. Left leg arterial insuff  -Stat arterial doppler  -Vasc surgery referral  -Cont ASA and RF modification 2. CAD-stable  -Cont current meds   -RF modification 3. ICM EF 30-35%  -Cont BB  -consider adding ACE/ARB at next visit  -low salt 4. DM2  -Aim for 30 minutes of exercise most days. Rethink what you drink. Water is great! Aim for 2-3 Carb Choices per meal (30-45 grams) +/- 1 either way  Aim for 0-15 Carbs per snack if hungry  Include protein in moderation with your meals and snacks  Consider reading food labels for Total Carbohydrate and  Fat Grams of foods  Consider checking BG at alternate times per day  Continue taking medication as directed Be mindful about how much sugar you are adding to beverages and other foods. Fruit Punch - find one with no sugar  Measure and decrease portions of carbohydrate foods  Make your plate and don't go back for seconds   Meds refilled Return in about 1 week (around 12/12/2016).  The patient was given clear instructions to go to ER or return to medical center if symptoms don't improve, worsen or new problems develop. The patient verbalized understanding. The patient was told to call to get lab results if they haven't heard anything in the next week.   This note has been created with Education officer, environmental. Any transcriptional errors are unintentional.    Scot Jun, PA-C [facility] [place], [place] [phone]   12/05/2016, 11:55 AM

## 2016-12-13 ENCOUNTER — Other Ambulatory Visit: Payer: Self-pay | Admitting: *Deleted

## 2016-12-13 DIAGNOSIS — M79609 Pain in unspecified limb: Secondary | ICD-10-CM

## 2016-12-13 DIAGNOSIS — L819 Disorder of pigmentation, unspecified: Secondary | ICD-10-CM

## 2016-12-24 ENCOUNTER — Ambulatory Visit: Payer: Medicaid Other | Admitting: Family Medicine

## 2017-01-02 ENCOUNTER — Other Ambulatory Visit: Payer: Self-pay | Admitting: *Deleted

## 2017-01-02 ENCOUNTER — Ambulatory Visit: Payer: Self-pay | Attending: Family Medicine | Admitting: Family Medicine

## 2017-01-02 ENCOUNTER — Encounter: Payer: Self-pay | Admitting: Family Medicine

## 2017-01-02 VITALS — BP 129/74 | HR 57 | Temp 97.8°F | Resp 18 | Ht 67.0 in | Wt 191.8 lb

## 2017-01-02 DIAGNOSIS — I771 Stricture of artery: Secondary | ICD-10-CM | POA: Insufficient documentation

## 2017-01-02 DIAGNOSIS — I10 Essential (primary) hypertension: Secondary | ICD-10-CM | POA: Insufficient documentation

## 2017-01-02 DIAGNOSIS — Z0000 Encounter for general adult medical examination without abnormal findings: Secondary | ICD-10-CM

## 2017-01-02 DIAGNOSIS — Z7984 Long term (current) use of oral hypoglycemic drugs: Secondary | ICD-10-CM | POA: Insufficient documentation

## 2017-01-02 DIAGNOSIS — Z7982 Long term (current) use of aspirin: Secondary | ICD-10-CM | POA: Insufficient documentation

## 2017-01-02 DIAGNOSIS — Z79899 Other long term (current) drug therapy: Secondary | ICD-10-CM | POA: Insufficient documentation

## 2017-01-02 DIAGNOSIS — E138 Other specified diabetes mellitus with unspecified complications: Secondary | ICD-10-CM

## 2017-01-02 DIAGNOSIS — Z23 Encounter for immunization: Secondary | ICD-10-CM

## 2017-01-02 DIAGNOSIS — R001 Bradycardia, unspecified: Secondary | ICD-10-CM

## 2017-01-02 DIAGNOSIS — E119 Type 2 diabetes mellitus without complications: Secondary | ICD-10-CM | POA: Insufficient documentation

## 2017-01-02 LAB — GLUCOSE, POCT (MANUAL RESULT ENTRY): POC Glucose: 211 mg/dl — AB (ref 70–99)

## 2017-01-02 MED ORDER — CARVEDILOL 3.125 MG PO TABS
3.1250 mg | ORAL_TABLET | Freq: Every day | ORAL | 2 refills | Status: DC
Start: 2017-01-02 — End: 2017-01-02

## 2017-01-02 MED ORDER — CARVEDILOL 3.125 MG PO TABS: 3.1250 mg | ORAL_TABLET | Freq: Two times a day (BID) | ORAL | 2 refills | Status: DC

## 2017-01-02 MED ORDER — LISINOPRIL 5 MG PO TABS
5.0000 mg | ORAL_TABLET | Freq: Every day | ORAL | 2 refills | Status: DC
Start: 2017-01-02 — End: 2017-01-17

## 2017-01-02 NOTE — Progress Notes (Signed)
Subjective:  Patient ID: Isaac Ray, male    DOB: 08-03-1971  Age: 46 y.o. MRN: [id number]  CC: Establish Care   HPI DAVONTAE PRUSINSKI presents for   F/u arterial insufficiency: Vascular surgery referral in process. Reports adherence with mediations. Upcoming appointment at 01/11/2017 for ABI. Reports no worsening of symptoms. Denies any wounds, pain, swelling or coolness of the leg. Reports episodes  intermittent color change to great toe and second digit.   HTN: Denies any CP, SOB, or BLE. Reports adherence with medications. Denies any associated symptoms.  DM: Checks blood glucose at home once a day. Reports adherence with medications. Denies any associated symptoms.   Outpatient Medications Prior to Visit  Medication Sig Dispense Refill  . aspirin 325 MG tablet Take 1 tablet (325 mg total) by mouth daily. 30 tablet 2  . atorvastatin (LIPITOR) 80 MG tablet Take 1 tablet (80 mg total) by mouth daily at 6 PM. (Patient taking differently: Take 80 mg by mouth daily. ) 30 tablet 2  . glipiZIDE (GLUCOTROL XL) 10 MG 24 hr tablet Take 1 tablet (10 mg total) by mouth daily with breakfast. 30 tablet 2  . isosorbide mononitrate (IMDUR) 30 MG 24 hr tablet Take 1 tablet (30 mg total) by mouth daily. 30 tablet 2  . metFORMIN (GLUCOPHAGE) 1000 MG tablet Take 1 tablet (1,000 mg total) by mouth 2 (two) times daily with a meal. 60 tablet 2  . carvedilol (COREG) 3.125 MG tablet Take 1 tablet (3.125 mg total) by mouth 2 (two) times daily. 60 tablet 2  . nitroGLYCERIN (NITROSTAT) 0.4 MG SL tablet Place 1 tablet (0.4 mg total) under the tongue every 5 (five) minutes as needed for chest pain. 60 tablet 12  . pantoprazole (PROTONIX) 40 MG tablet Take 1 tablet (40 mg total) by mouth daily. (Patient not taking: Reported on 11/28/2016) 30 tablet 0   No facility-administered medications prior to visit.     ROS Review of Systems  Respiratory: Negative.   Cardiovascular: Negative.   Gastrointestinal:  Negative.   Musculoskeletal: Negative.   Skin: Positive for color change.  Neurological: Negative.    Objective:  BP 129/74   Pulse (!) 57   Temp 97.8 F (36.6 C) (Oral)   Resp 18   Ht 5\' 7"  (1.702 m)   Wt 191 lb 12.8 oz (87 kg)   SpO2 96%   BMI 30.04 kg/m   BP/Weight 01/06/2017 01/02/2017 12/05/2016  Systolic BP 124 129 145  Diastolic BP 88 74 73  Wt. (Lbs) - 191.8 189.8  BMI - 30.04 29.73   Physical Exam  Cardiovascular: Normal rate, regular rhythm, normal heart sounds and intact distal pulses.   Pulses:      Dorsalis pedis pulses are 2+ on the right side, and 2+ on the left side.  Negative Homan's sign.   Pulmonary/Chest: Effort normal and breath sounds normal.  Abdominal: Soft. Bowel sounds are normal.  Neurological:  Paresthesias not present.  Skin: Skin is warm and dry.   Left great toe and 2nd digit capillary refill is sluggish. Purple like discoloration.   Nursing note and vitals reviewed.  Diabetic Foot Exam - Simple   Simple Foot Form Diabetic Foot exam was performed with the following findings:  Yes 01/02/2017 10:24 AM  Visual Inspection No deformities, no ulcerations, no other skin breakdown bilaterally:  Yes See comments:  Yes Sensation Testing Intact to touch and monofilament testing bilaterally:  Yes Pulse Check Posterior Tibialis and Dorsalis pulse intact  bilaterally:  Yes Comments  Left great toe and 2nd digit capillary refill is sluggish. Purple like discoloration.     Assessment & Plan:   Problem List Items Addressed This Visit      Cardiovascular and Mediastinum   Essential hypertension - Primary   Relevant Medications   lisinopril (PRINIVIL,ZESTRIL) 5 MG tablet     Endocrine   Diabetes (HCC)   Follow up DM in 2 weeks   Relevant Medications   lisinopril (PRINIVIL,ZESTRIL) 5 MG tablet   Other Relevant Orders   Glucose (CBG) (Completed)    Other Visit Diagnoses    Healthcare maintenance       Relevant Orders   Pneumococcal conjugate  vaccine 13-valent (Completed)   Bradycardia       Follow up BP check in 2 weeks.      Meds ordered this encounter  Medications  . DISCONTD: carvedilol (COREG) 3.125 MG tablet    Sig: Take 1 tablet (3.125 mg total) by mouth daily.    Dispense:  30 tablet    Refill:  2    Order Specific Question:   Supervising Provider    Answer:   Quentin Angst [id number]  . lisinopril (PRINIVIL,ZESTRIL) 5 MG tablet    Sig: Take 1 tablet (5 mg total) by mouth daily.    Dispense:  30 tablet    Refill:  2    Order Specific Question:   Supervising Provider    Answer:   Quentin Angst [id number]    Follow-up: Return in about 2 weeks (around 01/16/2017) for BP check with RN & Diabetes with clinical pharmacist .   Lizbeth Bark FNP

## 2017-01-02 NOTE — Patient Instructions (Addendum)
If you develop any decreased sensation in your toes, swelling, or toes become purple or blue without color return go to the Emergency Department.  Get and complete financial paperwork for orange card. Schedule appointment in 2 weeks with clinical pharmacist for diabetes.  Begin taking blood sugars twice a day am and before bedtime. Keep log of blood sugars to bring with you to the next visit.  Diagnstico de la diabetes mellitus tipo2 en los adultos (Type 2 Diabetes Mellitus, Diagnosis, Adult) La diabetes tipo2 (diabetes mellitus tipo2) es una enfermedad de larga duracin (crnica). Puede deberse a uno de [employer] o a ambos:  El cuerpo no produce la cantidad suficiente de una hormona llamada insulina.  El cuerpo no reacciona de forma normal a la insulina que produce. La insulina permite que los ciertos azcares (glucosa) ingresen a las clulas del cuerpo. Esto le proporciona la energa. Cuando se tiene diabetes tipo2, la glucosa no pueden ingresar a las clulas. Esto produce un aumento del nivel de glucosa en la sangre (hiperglucemia). El mdico fijar los objetivos del tratamiento para usted. Generalmente, los resultados de los niveles de glucosa en la sangre deben ser los siguientes:  Antes de las comidas (preprandial): de 80 a 130mg /dl (4,4 a 2,7OZDG/U).  Despus de las comidas (posprandial): por debajo de 180mg /dl (44IHKV/Q).  Nivel deA1c (hemoglobinaA1c): menos del7%. CUIDADOS EN EL HOGAR Preguntas para hacerle al mdico Puede hacer las siguientes preguntas:  Debo reunirme con IT trainer para el cuidado de la diabetes?  Dnde puedo encontrar un grupo de apoyo para personas diabticas?  Qu equipos necesitar para cuidarme en casa?  Qu medicamentos para la diabetes necesito? Cundo debo tomarlos?  Con qu frecuencia debo controlarme el nivel de glucosa en la sangre?  A qu nmero puedo llamar si tengo preguntas?  Cundo es la prxima cita con el  mdico? Instrucciones generales  [employer] medicamentos de venta libre y los recetados solamente como se lo haya indicado el mdico.  Oceanographer a todas las visitas de control como se lo haya indicado el mdico. Esto es importante. SOLICITE AYUDA SI:  El nivel de glucosa en la sangre es mayor o igual que 240mg /dl (25,9DGLO/VF) durante 2das seguidos.  Ha estado enfermo o ha tenido fiebre durante 2o ms [address]  [address] y no [place].  Si tiene alguno de [employer] durante ms de 6horas:  No puede comer ni beber.  Siente malestar estomacal (nuseas).  Vomita.  La materia fecal es lquida (diarrea). SOLICITE AYUDA DE INMEDIATO SI:  El nivel de glucosa en la sangre est por debajo de 54mg /dl (91mmol/l).  Est confundido.  Tiene dificultad para hacer lo siguiente:  Pensar con claridad.  Respirar.  Tiene niveles moderados o altos de cetonas en la [place]. Esta informacin no tiene Theme park manager el consejo del mdico. Asegrese de hacerle al mdico cualquier pregunta que tenga. Document Released: 01/11/2009 Document Revised: 02/06/2016 Document Reviewed: 11/18/2015 Elsevier Interactive Patient Education  2017 [employer].

## 2017-01-02 NOTE — Telephone Encounter (Signed)
Resent with proper signature of taking carvedilol BID with meals instead of daily.

## 2017-01-04 ENCOUNTER — Encounter: Payer: Self-pay | Admitting: Vascular Surgery

## 2017-01-06 ENCOUNTER — Emergency Department (HOSPITAL_COMMUNITY)
Admission: EM | Admit: 2017-01-06 | Discharge: 2017-01-06 | Disposition: A | Discharge status: Home / Self Care | Payer: Self-pay | Attending: Emergency Medicine | Admitting: Emergency Medicine

## 2017-01-06 ENCOUNTER — Encounter (HOSPITAL_COMMUNITY): Payer: Self-pay | Admitting: *Deleted

## 2017-01-06 ENCOUNTER — Emergency Department (HOSPITAL_COMMUNITY): Payer: Self-pay

## 2017-01-06 DIAGNOSIS — Z7982 Long term (current) use of aspirin: Secondary | ICD-10-CM | POA: Insufficient documentation

## 2017-01-06 DIAGNOSIS — I2510 Atherosclerotic heart disease of native coronary artery without angina pectoris: Secondary | ICD-10-CM | POA: Insufficient documentation

## 2017-01-06 DIAGNOSIS — I10 Essential (primary) hypertension: Secondary | ICD-10-CM | POA: Insufficient documentation

## 2017-01-06 DIAGNOSIS — Z955 Presence of coronary angioplasty implant and graft: Secondary | ICD-10-CM | POA: Insufficient documentation

## 2017-01-06 DIAGNOSIS — Z87891 Personal history of nicotine dependence: Secondary | ICD-10-CM | POA: Insufficient documentation

## 2017-01-06 DIAGNOSIS — Z951 Presence of aortocoronary bypass graft: Secondary | ICD-10-CM | POA: Insufficient documentation

## 2017-01-06 DIAGNOSIS — R1013 Epigastric pain: Secondary | ICD-10-CM

## 2017-01-06 DIAGNOSIS — I252 Old myocardial infarction: Secondary | ICD-10-CM | POA: Insufficient documentation

## 2017-01-06 DIAGNOSIS — Z7984 Long term (current) use of oral hypoglycemic drugs: Secondary | ICD-10-CM | POA: Insufficient documentation

## 2017-01-06 DIAGNOSIS — K2900 Acute gastritis without bleeding: Secondary | ICD-10-CM | POA: Insufficient documentation

## 2017-01-06 DIAGNOSIS — E119 Type 2 diabetes mellitus without complications: Secondary | ICD-10-CM | POA: Insufficient documentation

## 2017-01-06 LAB — COMPREHENSIVE METABOLIC PANEL
ALK PHOS: 74 U/L (ref 38–126)
ALT: 32 U/L (ref 17–63)
ANION GAP: 9 (ref 5–15)
AST: 27 U/L (ref 15–41)
Albumin: 4.3 g/dL (ref 3.5–5.0)
BILIRUBIN TOTAL: 0.8 mg/dL (ref 0.3–1.2)
BUN: 12 mg/dL (ref 6–20)
CALCIUM: 9.3 mg/dL (ref 8.9–10.3)
CO2: 25 mmol/L (ref 22–32)
Chloride: 104 mmol/L (ref 101–111)
Creatinine, Ser: 0.98 mg/dL (ref 0.61–1.24)
GFR calc non Af Amer: >60 mL/min (ref >60)
GLUCOSE: 182 mg/dL — AB (ref 65–99)
Potassium: 3.8 mmol/L (ref 3.5–5.1)
SODIUM: 138 mmol/L (ref 135–145)
TOTAL PROTEIN: 7.8 g/dL (ref 6.5–8.1)

## 2017-01-06 LAB — CBC
HCT: 45.9 % (ref 39.0–52.0)
Hemoglobin: 15.8 g/dL (ref 13.0–17.0)
MCH: 30.9 pg (ref 26.0–34.0)
MCHC: 34.4 g/dL (ref 30.0–36.0)
MCV: 89.8 fL (ref 78.0–100.0)
Platelets: 166 10*3/uL (ref 150–400)
RBC: 5.11 MIL/uL (ref 4.22–5.81)
RDW: 13.2 % (ref 11.5–15.5)
WBC: 14.6 10*3/uL — ABNORMAL HIGH (ref 4.0–10.5)

## 2017-01-06 LAB — LIPASE, BLOOD: Lipase: 62 U/L — ABNORMAL HIGH (ref 11–51)

## 2017-01-06 MED ORDER — SODIUM CHLORIDE 0.9 % IV BOLUS (SEPSIS)
1000.0000 mL | Freq: Once | INTRAVENOUS | Status: AC
  Administered 2017-01-06: 1000 mL via INTRAVENOUS

## 2017-01-06 MED ORDER — SUCRALFATE 1 G PO TABS: 1.0000 g | ORAL_TABLET | Freq: Three times a day (TID) | ORAL | 0 refills | Status: DC

## 2017-01-06 MED ORDER — ONDANSETRON 4 MG PO TBDP
ORAL_TABLET | ORAL | Status: AC
  Filled 2017-01-06: qty 1

## 2017-01-06 MED ORDER — PANTOPRAZOLE SODIUM 20 MG PO TBEC: 20.0000 mg | DELAYED_RELEASE_TABLET | Freq: Every day | ORAL | 0 refills | Status: DC

## 2017-01-06 MED ORDER — PANTOPRAZOLE SODIUM 40 MG PO TBEC: 40.0000 mg | DELAYED_RELEASE_TABLET | Freq: Every day | ORAL | 0 refills | Status: DC

## 2017-01-06 MED ORDER — ONDANSETRON HCL 4 MG PO TABS: 4.0000 mg | ORAL_TABLET | Freq: Three times a day (TID) | ORAL | 0 refills | Status: DC | PRN

## 2017-01-06 MED ORDER — ONDANSETRON 4 MG PO TBDP
4.0000 mg | ORAL_TABLET | Freq: Once | ORAL | Status: AC
  Administered 2017-01-06: 4 mg via ORAL

## 2017-01-06 MED ORDER — IOPAMIDOL (ISOVUE-300) INJECTION 61%
INTRAVENOUS | Status: AC
  Administered 2017-01-06: 100 mL
  Filled 2017-01-06: qty 100

## 2017-01-06 MED ORDER — HYDROMORPHONE HCL 2 MG/ML IJ SOLN
1.0000 mg | Freq: Once | INTRAMUSCULAR | Status: AC
  Administered 2017-01-06: 1 mg via INTRAVENOUS
  Filled 2017-01-06: qty 1

## 2017-01-06 NOTE — ED Triage Notes (Signed)
Pt states abdominal pain, diarrhea and vomiting since this am.

## 2017-01-06 NOTE — ED Provider Notes (Signed)
[facility]-EM[facility]Y DEPT Provider Note   CSN: [id number] Arrival date & time: 01/06/17  1349     History   Chief Complaint Chief Complaint  Patient presents with  . Emesis  . Abdominal Pain    HPI Isaac Ray is a 46 y.o. male.  HPI   Pt with hx CAD, HTN, HLD, DM p/w diffuse abdominal pain, worst in the upper abdomen that is sharp and severe, single episode of vomiting this morning, two episodes of diarrhea.  The pain was sharp and 10/10 all morning, 7/10 now.  He has never had pain like this before.  Denies fevers, CP, SOB, cough, urinary symptoms.   Denies hx abdominal surgeries, abnormal foods, sick contacts, recent travel.  Does not drink ETOH.    Past Medical History:  Diagnosis Date  . CAD (coronary artery disease)   . Essential hypertension   . Hyperlipidemia   . Type 2 diabetes mellitus [facility])     Patient Active Problem List   Diagnosis Date Noted  . Essential hypertension 04/20/2015  . Hyperlipidemia 04/20/2015  . Diabetes (HCC) 04/20/2015  . 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 NSTEMI (non-ST elevated myocardial infarction) (HCC) 02/12/2015    Past Surgical History:  Procedure Laterality Date  . CORONARY ANGIOPLASTY WITH STENT PLACEMENT  2013  . CORONARY ARTERY BYPASS GRAFT N/A 02/16/2015   Procedure: CORONARY ARTERY BYPASS GRAFTING (CABG) x5 using left internal mammary artery, right thigh greater saphenous vein, and left radial artery. ;  Surgeon: [name], MD;  Location: [facility] OR;  Service: Open Heart Surgery;  Laterality: N/A;  . LEFT HEART CATHETERIZATION WITH CORONARY ANGIOGRAM N/A 02/14/2015   Procedure: LEFT HEART CATHETERIZATION WITH CORONARY ANGIOGRAM;  Surgeon: Runell Gess, MD;  Location: [facility] CATH LAB;  Service: Cardiovascular;  Laterality: N/A;  . RADIAL ARTERY HARVEST Left 02/16/2015   Procedure: RADIAL ARTERY HARVEST;  Surgeon: [name], MD;  Location: [facility] OR;  Service: Open Heart Surgery;  Laterality: Left;  . TEE WITHOUT CARDIOVERSION  N/A 02/16/2015   Procedure: TRANSESOPHAGEAL ECHOCARDIOGRAM (TEE);  Surgeon: [name], MD;  Location: [facility] OR;  Service: Open Heart Surgery;  Laterality: N/A;       Home Medications    Prior to Admission medications   Medication Sig Start Date End Date Taking? Authorizing Provider  aspirin 325 MG tablet Take 1 tablet (325 mg total) by mouth daily. 12/05/16  Yes Tiffany Netta Cedars, PA-C  atorvastatin (LIPITOR) 80 MG tablet Take 1 tablet (80 mg total) by mouth daily at 6 PM. Patient taking differently: Take 80 mg by mouth daily.  12/05/16  Yes Tiffany Netta Cedars, PA-C  carvedilol (COREG) 3.125 MG tablet Take 1 tablet (3.125 mg total) by mouth 2 (two) times daily with a meal. 01/02/17  Yes Lizbeth Bark, FNP  glipiZIDE (GLUCOTROL XL) 10 MG 24 hr tablet Take 1 tablet (10 mg total) by mouth daily with breakfast. 12/05/16  Yes Vivianne Master, PA-C  isosorbide mononitrate (IMDUR) 30 MG 24 hr tablet Take 1 tablet (30 mg total) by mouth daily. 12/05/16  Yes Tiffany Netta Cedars, PA-C  lisinopril (PRINIVIL,ZESTRIL) 5 MG tablet Take 1 tablet (5 mg total) by mouth daily. 01/02/17  Yes Lizbeth Bark, FNP  metFORMIN (GLUCOPHAGE) 1000 MG tablet Take 1 tablet (1,000 mg total) by mouth 2 (two) times daily with a meal. 12/05/16  Yes Tiffany Netta Cedars, PA-C  nitroGLYCERIN (NITROSTAT) 0.4 MG SL tablet Place 1 tablet (0.4 mg total) under the tongue  every 5 (five) minutes as needed for chest pain. 04/19/15  Yes [name], MD  PRESCRIPTION MEDICATION Take 200 mg by mouth See admin instructions. Take 2 tablets (200 mg) Diclofenaco (from [place]) daily as needed for pain   Yes Historical Provider, MD  ondansetron (ZOFRAN) 4 MG tablet Take 1 tablet (4 mg total) by mouth every 8 (eight) hours as needed for nausea or vomiting. 01/06/17   Trixie Dredge, PA-C  pantoprazole (PROTONIX) 20 MG tablet Take 1 tablet (20 mg total) by mouth daily. 01/06/17   Trixie Dredge, PA-C  pantoprazole (PROTONIX) 40 MG tablet Take 1 tablet (40 mg  total) by mouth daily. 01/06/17   Trixie Dredge, PA-C  sucralfate (CARAFATE) 1 g tablet Take 1 tablet (1 g total) by mouth 4 (four) times daily -  with meals and at bedtime. 01/06/17   Trixie Dredge, PA-C    Family History Family History  Problem Relation Age of Onset  . Coronary artery disease Father     had CABG    Social History Social History  Substance Use Topics  . Smoking status: Former Smoker    Quit date: 02/16/2015  . Smokeless tobacco: Never Used  . Alcohol use No     Allergies   Patient has no known allergies.   Review of Systems Review of Systems  All other systems reviewed and are negative.    Physical Exam Updated Vital Signs BP 123/58   Pulse (!) 47   Temp 97.5 F (36.4 C) (Oral)   Resp 22   SpO2 92%   Physical Exam  Constitutional: He appears well-developed and well-nourished. No distress.  HENT:  Head: Normocephalic and atraumatic.  Neck: Neck supple.  Cardiovascular: Normal rate and regular rhythm.   Pulmonary/Chest: Effort normal and breath sounds normal. No respiratory distress. He has no wheezes. He has no rales.  Abdominal: Soft. He exhibits no distension and no mass. There is tenderness. There is no rebound and no guarding.  Diffuse tenderness is mild, epigastrium with more significant tenderness.    Neurological: He is alert. He exhibits normal muscle tone.  Skin: He is not diaphoretic.  Nursing note and vitals reviewed.    ED Treatments / Results  Labs (all labs ordered are listed, but only abnormal results are displayed) Labs Reviewed  LIPASE, BLOOD - Abnormal; Notable for the following:       Result Value   Lipase 62 (*)    All other components within normal limits  COMPREHENSIVE METABOLIC PANEL - Abnormal; Notable for the following:    Glucose, Bld 182 (*)    All other components within normal limits  CBC - Abnormal; Notable for the following:    WBC 14.6 (*)    All other components within normal limits    EKG  EKG  Interpretation None       Radiology Ct Abdomen Pelvis W Contrast  Result Date: 01/06/2017 CLINICAL DATA:  Abdominal pain with vomiting and diarrhea. Epigastric tenderness. EXAM: CT ABDOMEN AND PELVIS WITH CONTRAST TECHNIQUE: Multidetector CT imaging of the abdomen and pelvis was performed using the standard protocol following bolus administration of intravenous contrast. CONTRAST:  ISOVUE-300 IOPAMIDOL (ISOVUE-300) INJECTION 61% COMPARISON:  None. FINDINGS: Lower chest:  Unremarkable. Hepatobiliary: No focal abnormality within the liver parenchyma. There is no evidence for gallstones, gallbladder wall thickening, or pericholecystic fluid. No intrahepatic or extrahepatic biliary dilation. Pancreas: No focal mass lesion. No dilatation of the main duct. No intraparenchymal cyst. No peripancreatic edema. Spleen: No  splenomegaly. No focal mass lesion. Adrenals/Urinary Tract: No adrenal nodule or mass. Cortical scarring noted posterior right kidney. Left kidney unremarkable. No evidence for hydroureter. The urinary bladder appears normal for the degree of distention. Stomach/Bowel: Stomach is nondistended. Although the antral region of the stomach can be difficult to assess by CT, there appears to be thickening and edema in the wall of the gastric antrum. The length of involvement and low-attenuation in the wall are both more prominent than typically seen for antral peristalsis. No evidence of outlet obstruction. Duodenum is normally positioned as is the ligament of Treitz. No small bowel wall thickening. No small bowel dilatation. The terminal ileum is normal. The appendix is normal. No gross colonic mass. No colonic wall thickening. No substantial diverticular change. Vascular/Lymphatic: There is abdominal aortic atherosclerosis without aneurysm. There is no gastrohepatic or hepatoduodenal ligament lymphadenopathy. No intraperitoneal or retroperitoneal lymphadenopathy. No pelvic sidewall lymphadenopathy.  Reproductive: The prostate gland and seminal vesicles have normal imaging features. Other: No intraperitoneal free fluid. Musculoskeletal: Small groin hernias contain only fat. Bone windows reveal no worrisome lytic or sclerotic osseous lesions. IMPRESSION: 1. Apparent edematous wall thickening in the distal stomach. Although the antrum can sometimes appear thickened on CT due to peristalsis, the segment of involvement on today's scan is longer than typically seen by CT and wall appears edematous (see coronal image 40 of series 6). As such, distal gastritis or peptic ulcer disease would be a consideration. Neoplasm cannot be excluded. 2.  Abdominal Aortic Atherosclerois (ICD10-170.0) 3. Small bilateral groin hernias contain only fat. Electronically Signed   By: Kennith Center M.D.   On: 01/06/2017 20:07    Procedures Procedures (including critical care time)  Medications Ordered in ED Medications  ondansetron (ZOFRAN-ODT) disintegrating tablet 4 mg (4 mg Oral Given 01/06/17 1402)  sodium chloride 0.9 % bolus 1,000 mL (0 mLs Intravenous Stopped 01/06/17 1846)  HYDROmorphone (DILAUDID) injection 1 mg (1 mg Intravenous Given 01/06/17 1625)  iopamidol (ISOVUE-300) 61 % injection (100 mLs  Contrast Given 01/06/17 1932)     Initial Impression / Assessment and Plan / ED Course  I have reviewed the triage vital signs and the nursing notes.  Pertinent labs & imaging results that were available during my care of the patient were reviewed by me and considered in my medical decision making (see chart for details).    Afebrile, nontoxic patient with abdominal pain, vomiting/diarrhea.  Epigastric tenderness.  Labs remarkable for leukocytosis and slightly elevated lipase.  CT abd/pelvis demonstrates gastritis vs PUD vs neoplasm.  Will treat pt for gastritis/PUD and encouraged close GI follow up for endoscopy.   D/C home with zofran, protonix, carafate, GI follow up.   Discussed result, findings, treatment, and  follow up  with patient.  Pt given return precautions.  Pt verbalizes understanding and agrees with plan.       Final Clinical Impressions(s) / ED Diagnoses   Final diagnoses:  Epigastric pain  Acute gastritis, presence of bleeding unspecified, unspecified gastritis type    New Prescriptions New Prescriptions   ONDANSETRON (ZOFRAN) 4 MG TABLET    Take 1 tablet (4 mg total) by mouth every 8 (eight) hours as needed for nausea or vomiting.   PANTOPRAZOLE (PROTONIX) 20 MG TABLET    Take 1 tablet (20 mg total) by mouth daily.   SUCRALFATE (CARAFATE) 1 G TABLET    Take 1 tablet (1 g total) by mouth 4 (four) times daily -  with meals and at bedtime.  Trixie Dredge, PA-C 01/06/17 2054    Lorre Nick, MD 01/09/17 1335

## 2017-01-06 NOTE — Discharge Instructions (Signed)
Read the information below.  Use the prescribed medication as directed.  Please discuss all new medications with your pharmacist.  You may return to the Emergency Department at any time for worsening condition or any new symptoms that concern you.     If you develop high fevers, worsening abdominal pain, uncontrolled vomiting, or are unable to tolerate fluids by mouth, return to the ER for a recheck.   ° °Lea la información a continuación. Use la medicación prescrita según lo dirigido. Discuta todos los medicamentos nuevos con su farmacéutico. Puede regresar al Departamento de Emergencia en cualquier momento por empeoramiento de la condición o cualquier síntoma nuevo que le preocupe. Si desarrolla fiebre alta, empeoramiento del dolor abdominal, vómitos descontrolados o no puede tolerar los líquidos por la boca, regrese a la sala de emergencias para una nueva verificación. ° ° °

## 2017-01-09 ENCOUNTER — Ambulatory Visit (HOSPITAL_COMMUNITY)
Admission: RE | Admit: 2017-01-09 | Discharge: 2017-01-09 | Disposition: A | Discharge status: Home / Self Care | Payer: Self-pay | Source: Ambulatory Visit | Attending: Vascular Surgery | Admitting: Vascular Surgery

## 2017-01-09 DIAGNOSIS — L819 Disorder of pigmentation, unspecified: Secondary | ICD-10-CM | POA: Insufficient documentation

## 2017-01-09 DIAGNOSIS — M79609 Pain in unspecified limb: Secondary | ICD-10-CM | POA: Insufficient documentation

## 2017-01-11 ENCOUNTER — Ambulatory Visit (INDEPENDENT_AMBULATORY_CARE_PROVIDER_SITE_OTHER): Payer: Self-pay | Admitting: Vascular Surgery

## 2017-01-11 ENCOUNTER — Encounter: Payer: Self-pay | Admitting: Vascular Surgery

## 2017-01-11 ENCOUNTER — Encounter (HOSPITAL_COMMUNITY): Payer: Medicaid Other

## 2017-01-11 VITALS — BP 117/64 | HR 63 | Temp 98.3°F | Resp 20 | Ht 67.0 in | Wt 191.9 lb

## 2017-01-11 DIAGNOSIS — I7300 Raynaud's syndrome without gangrene: Secondary | ICD-10-CM

## 2017-01-11 NOTE — Progress Notes (Signed)
Referred by:  Vivianne Master, PA-C [address] [place], [place] [address]   Reason for referral: discoloration in L great toe and 5th toe    History of Present Illness  Isaac Ray is a 46 y.o. (Jun 28, 1971) male smoking, DM who presents with cc: L great toe and 5th toe discoloration.  The patient notes one month history of pain free color discoloration in L great toe and 5th toe.  He does not note any trauma history or cardiac arrhythmia.  He denies any intermittent claudication or rest pain.  He does not the discoloration in the L great toe and 5th toe does change colors: "pale, pink, and blue".  He denies any associated neurologic sx.  Past Medical History:  Diagnosis Date  . CAD (coronary artery disease)   . Essential hypertension   . Hyperlipidemia   . Type 2 diabetes mellitus (HCC)     Past Surgical History:  Procedure Laterality Date  . CORONARY ANGIOPLASTY WITH STENT PLACEMENT  2013  . CORONARY ARTERY BYPASS GRAFT N/A 02/16/2015   Procedure: CORONARY ARTERY BYPASS GRAFTING (CABG) x5 using left internal mammary artery, right thigh greater saphenous vein, and left radial artery. ;  Surgeon: [name], MD;  Location: [facility] OR;  Service: Open Heart Surgery;  Laterality: N/A;  . LEFT HEART CATHETERIZATION WITH CORONARY ANGIOGRAM N/A 02/14/2015   Procedure: LEFT HEART CATHETERIZATION WITH CORONARY ANGIOGRAM;  Surgeon: Runell Gess, MD;  Location: [facility] CATH LAB;  Service: Cardiovascular;  Laterality: N/A;  . RADIAL ARTERY HARVEST Left 02/16/2015   Procedure: RADIAL ARTERY HARVEST;  Surgeon: [name], MD;  Location: [facility] OR;  Service: Open Heart Surgery;  Laterality: Left;  . TEE WITHOUT CARDIOVERSION N/A 02/16/2015   Procedure: TRANSESOPHAGEAL ECHOCARDIOGRAM (TEE);  Surgeon: [name], MD;  Location: [facility] OR;  Service: Open Heart Surgery;  Laterality: N/A;    Social History   Social History  . Marital status: Single    Spouse name: N/A  .  Number of children: N/A  . Years of education: N/A   Occupational History  . Not on file.   Social History Main Topics  . Smoking status: Former Smoker    Quit date: 02/16/2015  . Smokeless tobacco: Never Used  . Alcohol use No  . Drug use: No  . Sexual activity: Not on file   Other Topics Concern  . Not on file   Social History Narrative  . No narrative on file    Family History  Problem Relation Age of Onset  . Coronary artery disease Father     had CABG    Current Outpatient Prescriptions  Medication Sig Dispense Refill  . aspirin 325 MG tablet Take 1 tablet (325 mg total) by mouth daily. 30 tablet 2  . atorvastatin (LIPITOR) 80 MG tablet Take 1 tablet (80 mg total) by mouth daily at 6 PM. (Patient taking differently: Take 80 mg by mouth daily. ) 30 tablet 2  . carvedilol (COREG) 3.125 MG tablet Take 1 tablet (3.125 mg total) by mouth 2 (two) times daily with a meal. 60 tablet 2  . glipiZIDE (GLUCOTROL XL) 10 MG 24 hr tablet Take 1 tablet (10 mg total) by mouth daily with breakfast. 30 tablet 2  . isosorbide mononitrate (IMDUR) 30 MG 24 hr tablet Take 1 tablet (30 mg total) by mouth daily. 30 tablet 2  . lisinopril (PRINIVIL,ZESTRIL) 5 MG tablet Take 1 tablet (5 mg total) by mouth daily. 30  tablet 2  . metFORMIN (GLUCOPHAGE) 1000 MG tablet Take 1 tablet (1,000 mg total) by mouth 2 (two) times daily with a meal. 60 tablet 2  . nitroGLYCERIN (NITROSTAT) 0.4 MG SL tablet Place 1 tablet (0.4 mg total) under the tongue every 5 (five) minutes as needed for chest pain. 60 tablet 12  . ondansetron (ZOFRAN) 4 MG tablet Take 1 tablet (4 mg total) by mouth every 8 (eight) hours as needed for nausea or vomiting. 20 tablet 0  . pantoprazole (PROTONIX) 20 MG tablet Take 1 tablet (20 mg total) by mouth daily. 30 tablet 0  . pantoprazole (PROTONIX) 40 MG tablet Take 1 tablet (40 mg total) by mouth daily. 30 tablet 0  . PRESCRIPTION MEDICATION Take 200 mg by mouth See admin instructions.  Take 2 tablets (200 mg) Diclofenaco (from [place]) daily as needed for pain    . sucralfate (CARAFATE) 1 g tablet Take 1 tablet (1 g total) by mouth 4 (four) times daily -  with meals and at bedtime. 40 tablet 0   No current facility-administered medications for this visit.     No Known Allergies   REVIEW OF SYSTEMS:   Cardiac:  positive for: no symptoms, negative for: Chest pain or chest pressure, Shortness of breath upon exertion and Shortness of breath when lying flat,   Vascular:  positive for: no symptoms,  negative for: Pain in calf, thigh, or hip brought on by ambulation, Pain in feet at night that wakes you up from your sleep, Blood clot in your veins and Leg swelling  Pulmonary:  positive for: no symptoms,  negative for: Oxygen at home, Productive cough and Wheezing  Neurologic:  positive for: No symptoms, negative for: Sudden weakness in arms or legs, Sudden numbness in arms or legs, Sudden onset of difficulty speaking or slurred speech, Temporary loss of vision in one eye and Problems with dizziness  Gastrointestinal:  positive for: no symptoms, negative for: Blood in stool and Vomited blood  Genitourinary:  positive for: no symptoms, negative for: Burning when urinating and Blood in urine  Psychiatric:  positive for: no symptoms,  negative for: Major depression  Hematologic:  positive for: no symptoms,  negative for: negative for: Bleeding problems and Problems with blood clotting too easily  Dermatologic:  positive for: abnormal skin lesions, negative for: Rashes or ulcers and abnormal skin lesions  Constitutional:  positive for: no symptoms, negative for: Fever or chills  Ear/Nose/Throat:  positive for: no symptoms, negative for: Change in hearing, Nose bleeds and Sore throat  Musculoskeletal:  positive for: no symptoms, negative for: Back pain, Joint pain and Muscle pain   Physical Examination  Vitals:   01/11/17 1024  BP: 117/64  Pulse:  63  Resp: 20  Temp: 98.3 F (36.8 C)  TempSrc: Oral  SpO2: 93%  Weight: 191 lb 14.4 oz (87 kg)  Height: 5\' 7"  (1.702 m)    Body mass index is 30.06 kg/m.  General: Alert, O x 3, WD, NAD  Head: [place]/AT,    Ear/Nose/Throat: Hearing grossly intact, nares without erythema or drainage, oropharynx without Erythema or Exudate, Mallampati score: 3, Dentition intact  Eyes: PERRLA, EOMI,    Neck: Supple, mid-line trachea,    Pulmonary: Sym exp, good B air movt, CTA B  Cardiac: RRR, Nl S1, S2, no Murmurs, No rubs, No S3,S4  Vascular: Vessel Right Left  Radial Palpable Palpable  Brachial Palpable Palpable  Carotid Palpable Palpable  Aorta Not palpable due to pannus N/A  Femoral Palpable Palpable  Popliteal Not palpable Not palpable  PT Palpable Palpable  DP Palpable Palpable   Gastrointestinal: soft, non-distended, non-tender to palpation, No guarding or rebound, no HSM, no masses, no CVAT B, No palpable prominent aortic pulse,    Musculoskeletal: M/S 5/5 throughout  , Extremities without ischemic changes  , No edema present, No obvious varicosities , No Lipodermatosclerosis present, initial L great toe blue by end of case pink, mild cyanosis in L 5th toe  Neurologic: Cranial nerves 2-12 intact , Pain and light touch intact in extremities , Motor exam as listed above  Psychiatric: Judgement intact, Mood & affect appropriate for pt's clinical situation  Dermatologic: See M/S exam for extremity exam, No rashes otherwise noted  Lymph : Palpable lymph nodes: None   Non-Invasive Vascular Imaging   ABI (Date: 01/11/2017)  R:   ABI: 0.80,   DP: tri  PT: bi  TBI: 0.73  L:   ABI: 0.77,   DP: tri  PT: tri  TBI: 0.54   Outside Studies/Documentation 8 pages of outside documents were reviewed including: outpatient PCP chart.   Medical Decision Making  Isaac Ray is a 46 y.o. male who presents with: possible Raynaud Phenomena, DM, mod PAD    Pt's  DDx: 1. Thromboembolism from cardiac or arterial etiology 2. Buerger's disease 3. PAD whether multi-level or digital artery 4. Raynaud's phenomena    Regardless, the patient would benefit from smoking cessation especially if its Buerger's disease  As I witnessed the color changes in the office, I have some suspicion he might have Raynaud's phenomena.  As the patient is relatively asx currently, I gave him some conservative mgmt instructions and will have him come back in one month.  If his discoloration is not resolved, I will proceed with thromboembolism/PAD work-up: TTE, CTA chest/abd/pelvis with BRo  I discussed in depth with the patient the nature of atherosclerosis, and emphasized the importance of maximal medical management including strict control of blood pressure, blood glucose, and lipid levels, obtaining regular exercise, antiplatelet agents, and cessation of smoking.   The patient is currently on a statin: Lipitor.  The patient is currently on an anti-platelet: ASA.  The patient is aware that without maximal medical management the underlying atherosclerotic disease process will progress, limiting the benefit of any interventions.  Thank you for allowing [place] to participate in this patient's care.   Leonides Sake, MD, FACS [facility] Office: [phone] Pager: [phone]  01/11/2017, 11:17 AM

## 2017-01-17 ENCOUNTER — Ambulatory Visit: Payer: Self-pay | Attending: Family Medicine | Admitting: Pharmacist

## 2017-01-17 DIAGNOSIS — R001 Bradycardia, unspecified: Secondary | ICD-10-CM

## 2017-01-17 DIAGNOSIS — E138 Other specified diabetes mellitus with unspecified complications: Secondary | ICD-10-CM

## 2017-01-17 DIAGNOSIS — E1165 Type 2 diabetes mellitus with hyperglycemia: Secondary | ICD-10-CM | POA: Insufficient documentation

## 2017-01-17 DIAGNOSIS — E139 Other specified diabetes mellitus without complications: Secondary | ICD-10-CM

## 2017-01-17 DIAGNOSIS — I10 Essential (primary) hypertension: Secondary | ICD-10-CM | POA: Insufficient documentation

## 2017-01-17 MED ORDER — CARVEDILOL 3.125 MG PO TABS: 3.1250 mg | ORAL_TABLET | Freq: Two times a day (BID) | ORAL | 2 refills | Status: DC

## 2017-01-17 MED ORDER — LISINOPRIL 5 MG PO TABS: 5.0000 mg | ORAL_TABLET | Freq: Every day | ORAL | 2 refills | Status: DC

## 2017-01-17 MED ORDER — METFORMIN HCL 1000 MG PO TABS: 1000.0000 mg | ORAL_TABLET | Freq: Two times a day (BID) | ORAL | 2 refills | Status: DC

## 2017-01-17 MED ORDER — GLIPIZIDE ER 2.5 MG PO TB24: 2.5000 mg | ORAL_TABLET | Freq: Every day | ORAL | 2 refills | Status: DC

## 2017-01-17 NOTE — Patient Instructions (Addendum)
Thanks for coming to see [place]!  Decrease glipizide to 2.5 mg daily  Come back immediately if you have any readings less than 70.  Follow up with Isaac Ray as directed (6 weeks from now) or sooner if you have low blood sugars   Hipoglucemia (Hypoglycemia) La hipoglucemia se produce cuando el nivel de azcar (glucosa) en la sangre es demasiado bajo. Los sntomas de la glucemia baja pueden incluir los siguientes:  Sentir que tiene lo siguiente:  Apetito.  Preocupacin o nervios (ansioso).  Sudoracin y piel hmeda.  Confusin.  Mareos.  Somnolencia.  Nuseas.  Tener lo siguiente:  Latidos cardacos acelerados.  Dolor de [place].  Cambios en la visin.  Una crisis de movimientos que no puede controlar (convulsiones).  Pesadillas.  Hormigueo y falta de sensibilidad (adormecimiento) alrededor de la boca, los labios o la [place].  Dificultades para hacer lo siguiente:  Hablar.  Prestar atencin (concentrarse).  Moverse (coordinacin).  Dormir.  Temblores.  Desmayos.  Molestarse con facilidad (irritabilidad). Las personas que tienen diabetes y las que no tienen la enfermedad pueden tener la glucemia baja. El nivel bajo de glucemia en la sangre puede ocurrir rpidamente y ser Engineer, maintenance (IT). Tratamiento de la glucemia baja  Generalmente, el tratamiento de la glucemia baja consiste en ingerir de inmediato un alimento o una bebida que contengan azcar. Si puede pensar con claridad y tragar de manera segura, siga la regla 15/15, que consiste en lo siguiente:  Consumir 15gramos de un hidrato de carbono de accin rpida. Algunos hidratos de carbono de accin rpida son los siguientes:  1pomo de glucosa en gel.  3comprimidos de azcar (comprimidos de glucosa).  6 a 8unidades de caramelos duros.  4onzas (1109m) de jugo de frutas.  4onzas (1272m de gaseosa comn (no diettica).  Contrlese la glucemia 1564mtos despus de ingerir el hidrato de  carbono.  Si el nivel de glucosa en la sangre todava es igual o menor que 13m95m (3,9mmo11m), ingiera nuevamente 15gramos de un hidrato de carbono.  Si el nivel de glucosa en la sangre no supera los 13mg/49m3,9mmol/37mdespus de 3intentos, solicite ayuda de inmediato.  Ingiera una comida o una colacin en el transcurso de 1hora despus de que la glucemia se haya normalizado. Tratamiento de la glucemia muy baja  Si el nivel de glucosa en la sangre es igual o menor que 54mg/dl46mmol/l)13mignifica que est muy bajo (hipoglucemia grave). Esto es una emergEngineer, maintenance (IT)re hasta que los sntomas desaparezcan. Solicite atencin mdica de inmediato. Comunquese con el servicio de emergencias de su localidad (911 en los [place]). No conduzca por sus propios medios hasta el Principal Financialivel de glucosa en la sangre muy bajo y no puede ingerir ningn alimento ni bebida, tal vez deba aplicarse una inyeccin de glucagn. Un familiar o un amigo deben aprender a controlarle la glucemia y a aplicarle una inyeccin de glucagn. Pregntele al mdico si debe tener un kit de inyecciones de glucagn en su casa. CUIDADOS EN EL HOGAR Instrucciones generales  Evite cualquier dieta que le impida ingerir la cantidad suficiente de comida. Hable con el mdico antes de comenzar una dieta nueva.  Tome los medicamentos de venta libre y los recetados solamente como se lo haya indicado el mdico.  Limite el consumo de alcohol a no ms de 1medida p38mda si es mujer y no est embarazadaSan Marinodas p38mda si es hombre. Una medida equivale a 12onzas de cerveza, 5onzas de vino o 1onzas de bebidas  alcohlicas de alta graduacin.  Concurra a todas las visitas de control como se lo haya indicado el mdico. Esto es importante. Si usted tiene diabetes:  Asegrese de [employer] de la hipoglucemia.  Siempre tenga a mano una fuente de azcar, como por ejemplo:  Azcar.  Comprimidos de  azcar.  Gel de glucosa.  Jugo de frutas.  Gaseosa comn (gaseosa que no sea diettica).  Leche.  Caramelos duros.  Miel.  Tome los medicamentos segn las indicaciones.  Siga el plan de ejercicios y de alimentacin.  Coma a horario. No omita comidas.  Siga el plan para los das de enfermedad cuando no pueda comer o beber normalmente. Arme este plan de antemano con el mdico.  Contrlese la glucemia con la frecuencia que le haya indicado el mdico. Contrlesela siempre antes y despus de hacer actividad fsica.  Comparta su plan de atencin de la diabetes con estas personas:  Compaeros de trabajo o de la escuela.  [place]quellas con las que [place].  Hgase anlisis de orina para Product manager presencia de cetonas:  Cuando est enfermo.  Como se lo haya indicado el mdico.  Lleve consigo una tarjeta, o use un brazalete o una medalla que indiquen que es diabtico. Si tiene un nivel bajo de glucosa en la sangre debido a otras causas:  Contrlese la glucemia con la frecuencia que le haya indicado el mdico.  Siga las indicaciones del mdico respecto de lo que no puede comer o beber. SOLICITE AYUDA SI:  Tiene problemas para mantener el nivel de glucosa en la sangre dentro del rango indicado.  Tiene la glucemia baja con frecuencia. SOLICITE AYUDA DE INMEDIATO SI:  Contina teniendo sntomas despus de haber comido o ingerido algo con azcar.  La glucemia es igual o inferior a 69m/dl (336ml/dl).  Tiene una crisis de movimientos que no puIT consultant Se desmaya. Estos sntomas pueden inSales executiveNo espere hasta que los sntomas desaparezcan. Solicite atencin mdica de inmediato. Comunquese con el servicio de emergencias de su localidad (911 en los [place]). No conduzca por sus propios medios haPrincipal FinancialEsta informacin no tiene coMarine scientistl consejo del mdico. Asegrese de hacerle al mdico cualquier pregunta que tenga. Document  Released: 11/17/2010 Document Revised: 02/06/2016 Document Reviewed: 11/18/2015 Elsevier Interactive Patient Education  2017 [employer]

## 2017-01-17 NOTE — Progress Notes (Signed)
    S:     Chief Complaint  Patient presents with  . Medication Management    Patient arrives in good spirits.  Presents for diabetes evaluation, education, and management at the request of Isaac Senate, NP/Dr. Hyman Ray. Patient was referred on 01/02/2017.  Patient was last seen by Primary Care Provider on 01/02/2017. Isaac Ray #[id number] was used as an interpreter for the entirety of the visit.  Patient reports adherence with medications.  Current diabetes medications include: metformin 1000 mg BID and glipizide XL 10 mg daily. Current hypertension medications include: carvedilol 3.125 mg BID, lisinopril 5 mg daily/.  Patient reports hypoglycemic events. He reports one episode of nocturnal hypoglycemia with a reading of 31 - he woke up feeling shaky. He drank a regular soda and his blood glucose returned to normal.   Patient denies nocturia.  Patient denies neuropathy. Patient denies visual changes. Patient denies self foot exams.    O:  Physical Exam  Vitals reviewed.    ROS   Lab Results  Component Value Date   HGBA1C 8.6 12/05/2016   Vitals:   01/17/17 1105  BP: 126/78  Pulse: 64    Home fasting CBG: 100s-110s 2 hour post-prandial/random CBG: 31, 130s   A/P: Diabetes longstanding currently UNcontrolled based on A1c of 8.6 and hypoglycemia. Patient reports hypoglycemic events and is able to verbalize appropriate hypoglycemia management plan. Patient reports adherence with medication. Control is suboptimal due to hypoglycemia.  Continue metformin 1000 mg BID and decrease glipizide XL to 2.5 mg daily. The nocturnal hypoglycemia is very concerning so would like to go back to lowest dose and then titrate up to 5 mg daily if needed. Instructed patient to call us immediately if he has another reading <70 and if he does, would stop the glipizide.   Next A1C anticipated May 2018.    Hypertension longstanding currently controlled, with no bradycardia.  Patient reports  adherence with medication. Continue current medications as prescribed.  Written patient instructions provided.  Total time in face to face counseling 30 minutes.   Follow up in Pharmacist Clinic Visit if hypoglycemia occurs, next planned visit with [facility] in May for A1c.   Patient seen with Isaac Ray, PharmD Candidate

## 2017-01-31 ENCOUNTER — Encounter: Payer: Self-pay | Admitting: Vascular Surgery

## 2017-02-05 NOTE — Progress Notes (Deleted)
Established Peripheral Arterial Disease   History of Present Illness  Isaac Ray is a 46 y.o. (Aug 20, 1971) male who presents with chief complaint: L great toe and 5th toe discoloration.  On his previous visit, I had some suspicion he might have Raynaud's phenomena.  The patient has ***stoppped smoking.  ***The patient's symptoms have *** progressed.  The patient's symptoms are: ***.  The patient's treatment regimen currently included: maximal medical management.  The patient's PMH, PSH, and SH, and FamHx are unchanged from 01/11/17.  Current Outpatient Prescriptions  Medication Sig Dispense Refill  . aspirin 325 MG tablet Take 1 tablet (325 mg total) by mouth daily. 30 tablet 2  . atorvastatin (LIPITOR) 80 MG tablet Take 1 tablet (80 mg total) by mouth daily at 6 PM. (Patient taking differently: Take 80 mg by mouth daily. ) 30 tablet 2  . carvedilol (COREG) 3.125 MG tablet Take 1 tablet (3.125 mg total) by mouth 2 (two) times daily with a meal. 60 tablet 2  . glipiZIDE (GLUCOTROL XL) 2.5 MG 24 hr tablet Take 1 tablet (2.5 mg total) by mouth daily with breakfast. 30 tablet 2  . isosorbide mononitrate (IMDUR) 30 MG 24 hr tablet Take 1 tablet (30 mg total) by mouth daily. 30 tablet 2  . lisinopril (PRINIVIL,ZESTRIL) 5 MG tablet Take 1 tablet (5 mg total) by mouth daily. 30 tablet 2  . metFORMIN (GLUCOPHAGE) 1000 MG tablet Take 1 tablet (1,000 mg total) by mouth 2 (two) times daily with a meal. 60 tablet 2  . nitroGLYCERIN (NITROSTAT) 0.4 MG SL tablet Place 1 tablet (0.4 mg total) under the tongue every 5 (five) minutes as needed for chest pain. 60 tablet 12  . ondansetron (ZOFRAN) 4 MG tablet Take 1 tablet (4 mg total) by mouth every 8 (eight) hours as needed for nausea or vomiting. 20 tablet 0  . pantoprazole (PROTONIX) 20 MG tablet Take 1 tablet (20 mg total) by mouth daily. 30 tablet 0  . pantoprazole (PROTONIX) 40 MG tablet Take 1 tablet (40 mg total) by mouth daily. 30 tablet 0  .  PRESCRIPTION MEDICATION Take 200 mg by mouth See admin instructions. Take 2 tablets (200 mg) Diclofenaco (from [place]) daily as needed for pain    . sucralfate (CARAFATE) 1 g tablet Take 1 tablet (1 g total) by mouth 4 (four) times daily -  with meals and at bedtime. 40 tablet 0   No current facility-administered medications for this visit.     No Known Allergies  On ROS today: ***, ***.   ***Physical Examination  There were no vitals filed for this visit.  There is no height or weight on file to calculate BMI.  General: {LOC:19197::"Somulent","Alert"}, {Orientation:19197::"Confused","O x 3"}, {Weight:19197::"Obese","Cachectic","WD"},{General state of health:19197::"Ill appearing","NAD"}  Pulmonary: {Chest wall:19197::"Asx chest movement","Sym exp"}, {Air movt:19197::"Decreased *** air movt","good B air movt"},{BS:19197::"rales on ***","rhonchi on ***","wheezing on ***","CTA B"}  Cardiac: {Rhythm:19197::"Irregularly, irregular rate and rhythm","RRR, Nl S1, S2"}, {Murmur:19197::"Murmur present: ***","no Murmurs"}, {Rubs:19197::"Rub present: ***","No rubs"}, {Gallop:19197::"Gallop present: ***","No S3,S4"}  Vascular: Vessel Right Left  Radial {Palpable:19197::"Not palpable","Faintly palpable","Palpable"} {Palpable:19197::"Not palpable","Faintly palpable","Palpable"}  Brachial {Palpable:19197::"Not palpable","Faintly palpable","Palpable"} {Palpable:19197::"Not palpable","Faintly palpable","Palpable"}  Carotid Palpable, {Bruit:19197::"Bruit present","No Bruit"} Palpable, {Bruit:19197::"Bruit present","No Bruit"}  Aorta Not palpable N/A  Femoral {Palpable:19197::"Not palpable","Faintly palpable","Palpable"} {Palpable:19197::"Not palpable","Faintly palpable","Palpable"}  Popliteal {Palpable:19197::"Prominently palpable","Not palpable"} {Palpable:19197::"Prominently palpable","Not palpable"}  PT {Palpable:19197::"Not palpable","Faintly palpable","Palpable"} {Palpable:19197::"Not  palpable","Faintly palpable","Palpable"}  DP {Palpable:19197::"Not palpable","Faintly palpable","Palpable"} {Palpable:19197::"Not palpable","Faintly palpable","Palpable"}   Gastrointestinal: soft, {Distension:19197::"distended","non-distended"}, {TTP:19197::"TTP in *** quadrant","appropriate tenderness to palpation","non-tender to  palpation"}, {Guarding:19197::"Guarding and rebound in *** quadrant","No guarding or rebound"}, {HSM:19197::"HSM present","no HSM"}, {Masses:19197::"Mass present: ***","no masses"}, {Flank:19197::"CVAT on ***","Flank bruit present on ***","no CVAT B"}, {AAA:19197::"Palpable prominent aortic pulse","No palpable prominent aortic pulse"}, {Surgical incision:19197::"Surg incisions: ***","Surgical incisions well healed"," "}  Musculoskeletal: M/S 5/5 throughout {MS:19197::"except ***"," "}, Extremities without ischemic changes {MS:19197::"except ***"," "}, {Edema:19197::"Edema in *** legs","No edema present"}, {Varicosities:19197::"Varicosities present: ***"," "}, {LDS:19197::"LDS present: *** leg","No LDS present"}  Neurologic: CN 2-12 intact{CN:19197::"except ***"," "}, Pain and light touch intact in extremities{CN:19197::"except ***"," "}, Motor exam as listed above   Medical Decision Making  Isaac Ray is a 46 y.o. male who presents with:  L great toe and 5th toe discoloration, possible Raynaud Phenomena, DM, mod BLE PAD   Based on the patient's vascular studies and examination, I have offered the patient: ***.  I discussed in depth with the patient the nature of atherosclerosis, and emphasized the importance of maximal medical management including strict control of blood pressure, blood glucose, and lipid levels, antiplatelet agents, obtaining regular exercise, and cessation of smoking.    The patient is aware that without maximal medical management the underlying atherosclerotic disease process will progress, limiting the benefit of any interventions. The patient is  currently on a statin: Lipitor.  The patient is currently on an anti-platelet: ASA.  Thank you for allowing [place] to participate in this patient's care.   Leonides Sake, MD, FACS [facility] Office: [phone] Pager: [phone]

## 2017-02-08 ENCOUNTER — Ambulatory Visit: Payer: Self-pay | Admitting: Vascular Surgery

## 2017-02-08 DIAGNOSIS — I779 Disorder of arteries and arterioles, unspecified: Secondary | ICD-10-CM | POA: Insufficient documentation

## 2017-02-28 ENCOUNTER — Ambulatory Visit: Payer: Self-pay | Attending: Family Medicine | Admitting: Family Medicine

## 2017-02-28 ENCOUNTER — Other Ambulatory Visit: Payer: Self-pay

## 2017-02-28 ENCOUNTER — Encounter: Payer: Self-pay | Admitting: Family Medicine

## 2017-02-28 VITALS — BP 138/78 | HR 45 | Temp 97.8°F | Resp 18 | Ht 67.0 in | Wt 182.8 lb

## 2017-02-28 DIAGNOSIS — I10 Essential (primary) hypertension: Secondary | ICD-10-CM | POA: Insufficient documentation

## 2017-02-28 DIAGNOSIS — R001 Bradycardia, unspecified: Secondary | ICD-10-CM | POA: Insufficient documentation

## 2017-02-28 DIAGNOSIS — Z7984 Long term (current) use of oral hypoglycemic drugs: Secondary | ICD-10-CM | POA: Insufficient documentation

## 2017-02-28 DIAGNOSIS — E119 Type 2 diabetes mellitus without complications: Secondary | ICD-10-CM | POA: Insufficient documentation

## 2017-02-28 DIAGNOSIS — L03114 Cellulitis of left upper limb: Secondary | ICD-10-CM | POA: Insufficient documentation

## 2017-02-28 DIAGNOSIS — Z7982 Long term (current) use of aspirin: Secondary | ICD-10-CM | POA: Insufficient documentation

## 2017-02-28 DIAGNOSIS — I252 Old myocardial infarction: Secondary | ICD-10-CM | POA: Insufficient documentation

## 2017-02-28 DIAGNOSIS — Z09 Encounter for follow-up examination after completed treatment for conditions other than malignant neoplasm: Secondary | ICD-10-CM

## 2017-02-28 LAB — GLUCOSE, POCT (MANUAL RESULT ENTRY): POC Glucose: 160 mg/dl — AB (ref 70–99)

## 2017-02-28 LAB — POCT UA - MICROALBUMIN
Albumin/Creatinine Ratio, Urine, POC: <30
Creatinine, POC: 300 mg/dL
MICROALBUMIN (UR) POC: 30 mg/L

## 2017-02-28 LAB — POCT GLYCOSYLATED HEMOGLOBIN (HGB A1C): HEMOGLOBIN A1C: 6.8

## 2017-02-28 MED ORDER — METFORMIN HCL 1000 MG PO TABS: 1000.0000 mg | ORAL_TABLET | Freq: Two times a day (BID) | ORAL | 2 refills | Status: DC

## 2017-02-28 MED ORDER — DOXYCYCLINE HYCLATE 100 MG PO TABS: 100.0000 mg | ORAL_TABLET | Freq: Two times a day (BID) | ORAL | 0 refills | Status: DC

## 2017-02-28 MED ORDER — LISINOPRIL 10 MG PO TABS: 10.0000 mg | ORAL_TABLET | Freq: Every day | ORAL | 0 refills | Status: DC

## 2017-02-28 MED ORDER — CARVEDILOL 3.125 MG PO TABS: 3.1250 mg | ORAL_TABLET | Freq: Two times a day (BID) | ORAL | 2 refills | Status: DC

## 2017-02-28 MED ORDER — CARVEDILOL 3.125 MG PO TABS: 3.1250 mg | ORAL_TABLET | Freq: Every day | ORAL | 2 refills | Status: DC

## 2017-02-28 MED ORDER — GLIPIZIDE ER 2.5 MG PO TB24: 2.5000 mg | ORAL_TABLET | Freq: Every day | ORAL | 2 refills | Status: DC

## 2017-02-28 NOTE — Progress Notes (Signed)
Subjective:  Patient ID: Isaac Ray, male    DOB: 20-Nov-1970  Age: 46 y.o. MRN: [id number]  CC: Diabetes   HPI Isaac Ray presents for   Diabetes Mellitus: Patient presents for follow up of diabetes. Symptoms: none. Patient denies foot ulcerations, nausea, paresthesia of the feet, visual disturbances and vomitting.  Evaluation to date has been included: hemoglobin A1C.  Home sugars: BGs range between 70's to 140  in the am and 160's in the pm. Treatment to date: Continued metformin which has been effective.    F/u arterial insufficiency: Pt. Reports following up with vascular. ABI performed. Recommended follow up in 1 month. Reports adherence with mediations. Reports no worsening of symptoms. Denies any wounds, pain, swelling or coolness of the leg.   Outpatient Medications Prior to Visit  Medication Sig Dispense Refill  . aspirin 325 MG tablet Take 1 tablet (325 mg total) by mouth daily. 30 tablet 2  . atorvastatin (LIPITOR) 80 MG tablet Take 1 tablet (80 mg total) by mouth daily at 6 PM. (Patient taking differently: Take 80 mg by mouth daily. ) 30 tablet 2  . isosorbide mononitrate (IMDUR) 30 MG 24 hr tablet Take 1 tablet (30 mg total) by mouth daily. 30 tablet 2  . carvedilol (COREG) 3.125 MG tablet Take 1 tablet (3.125 mg total) by mouth 2 (two) times daily with a meal. 60 tablet 2  . glipiZIDE (GLUCOTROL XL) 2.5 MG 24 hr tablet Take 1 tablet (2.5 mg total) by mouth daily with breakfast. 30 tablet 2  . lisinopril (PRINIVIL,ZESTRIL) 5 MG tablet Take 1 tablet (5 mg total) by mouth daily. 30 tablet 2  . metFORMIN (GLUCOPHAGE) 1000 MG tablet Take 1 tablet (1,000 mg total) by mouth 2 (two) times daily with a meal. 60 tablet 2  . nitroGLYCERIN (NITROSTAT) 0.4 MG SL tablet Place 1 tablet (0.4 mg total) under the tongue every 5 (five) minutes as needed for chest pain. 60 tablet 12  . ondansetron (ZOFRAN) 4 MG tablet Take 1 tablet (4 mg total) by mouth every 8 (eight) hours as needed for  nausea or vomiting. 20 tablet 0  . pantoprazole (PROTONIX) 20 MG tablet Take 1 tablet (20 mg total) by mouth daily. 30 tablet 0  . pantoprazole (PROTONIX) 40 MG tablet Take 1 tablet (40 mg total) by mouth daily. 30 tablet 0  . PRESCRIPTION MEDICATION Take 200 mg by mouth See admin instructions. Take 2 tablets (200 mg) Diclofenaco (from [place]) daily as needed for pain    . sucralfate (CARAFATE) 1 g tablet Take 1 tablet (1 g total) by mouth 4 (four) times daily -  with meals and at bedtime. 40 tablet 0   No facility-administered medications prior to visit.     ROS Review of Systems  Constitutional: Negative.   Eyes: Negative.   Respiratory: Negative.   Cardiovascular: Negative.   Gastrointestinal: Negative.   Endocrine: Negative.   Skin: Negative.   Neurological: Negative.     Objective:  BP 138/78 (BP Location: Left Arm, Patient Position: Sitting, Cuff Size: Normal)   Pulse (!) 45   Temp 97.8 F (36.6 C) (Oral)   Resp 18   Ht 5\' 7"  (1.702 m)   Wt 182 lb 12.8 oz (82.9 kg)   SpO2 97%   BMI 28.63 kg/m   BP/Weight 02/28/2017 01/17/2017 01/11/2017  Systolic BP 138 126 117  Diastolic BP 78 78 64  Wt. (Lbs) 182.8 - 191.9  BMI 28.63 - 30.06   Physical  Exam  Eyes: Conjunctivae are normal. Pupils are equal, round, and reactive to light.  Neck: No JVD present.  Cardiovascular: Normal rate, regular rhythm, normal heart sounds and intact distal pulses.   Pulmonary/Chest: Effort normal and breath sounds normal.  Abdominal: Soft. Bowel sounds are normal.  Skin: Skin is warm and dry.  Psychiatric: He has a normal mood and affect.  Nursing note and vitals reviewed.  Assessment & Plan:   Problem List Items Addressed This Visit      Cardiovascular and Mediastinum   Essential hypertension   Relevant Medications   lisinopril (PRINIVIL,ZESTRIL) 10 MG tablet   carvedilol (COREG) 3.125 MG tablet     Endocrine   Type 2 diabetes mellitus (HCC) - Primary   Relevant Medications    lisinopril (PRINIVIL,ZESTRIL) 10 MG tablet   glipiZIDE (GLUCOTROL XL) 2.5 MG 24 hr tablet   metFORMIN (GLUCOPHAGE) 1000 MG tablet   Other Relevant Orders   Glucose (CBG) (Completed)   POCT glycosylated hemoglobin (Hb A1C) (Completed)   Lipid Panel   POCT UA - Microalbumin (Completed)   Ambulatory referral to Ophthalmology    Other Visit Diagnoses    History of non-ST elevation myocardial infarction (NSTEMI)       Pt.has not followed up cardiology since 2016 .    Relevant Medications   carvedilol (COREG) 3.125 MG tablet   Other Relevant Orders   Ambulatory referral to Cardiology   Sinus bradycardia       Asymptomatic bradycardia refer to cardiology.    Relevant Medications   lisinopril (PRINIVIL,ZESTRIL) 10 MG tablet   carvedilol (COREG) 3.125 MG tablet   Other Relevant Orders   Ambulatory referral to Cardiology   Follow up       Relevant Orders   Ambulatory referral to Gastroenterology   Cellulitis of left upper extremity       Relevant Medications   doxycycline (VIBRA-TABS) 100 MG tablet      Meds ordered this encounter  Medications  . doxycycline (VIBRA-TABS) 100 MG tablet    Sig: Take 1 tablet (100 mg total) by mouth 2 (two) times daily.    Dispense:  28 tablet    Refill:  0    Order Specific Question:   Supervising Provider    Answer:   Quentin Angst [id number]  . DISCONTD: carvedilol (COREG) 3.125 MG tablet    Sig: Take 1 tablet (3.125 mg total) by mouth daily.    Dispense:  30 tablet    Refill:  2    Order Specific Question:   Supervising Provider    Answer:   Quentin Angst [id number]  . lisinopril (PRINIVIL,ZESTRIL) 10 MG tablet    Sig: Take 1 tablet (10 mg total) by mouth daily.    Dispense:  90 tablet    Refill:  0    Order Specific Question:   Supervising Provider    Answer:   Quentin Angst [id number]  . glipiZIDE (GLUCOTROL XL) 2.5 MG 24 hr tablet    Sig: Take 1 tablet (2.5 mg total) by mouth daily with breakfast.    Dispense:  90  tablet    Refill:  2    Order Specific Question:   Supervising Provider    Answer:   Quentin Angst [id number]  . metFORMIN (GLUCOPHAGE) 1000 MG tablet    Sig: Take 1 tablet (1,000 mg total) by mouth 2 (two) times daily with a meal.    Dispense:  120 tablet    Refill:  2  Order Specific Question:   Supervising Provider    Answer:   Quentin Angst [id number]  . carvedilol (COREG) 3.125 MG tablet    Sig: Take 1 tablet (3.125 mg total) by mouth 2 (two) times daily with a meal.    Dispense:  60 tablet    Refill:  2    Order Specific Question:   Supervising Provider    Answer:   Quentin Angst [[id number]]    Follow-up: Return in about 3 months (around 05/31/2017) for DM/HTN.   Lizbeth Bark FNP

## 2017-02-28 NOTE — Patient Instructions (Addendum)
You will be called with your labs results.    Bradicardia en los adultos (Bradycardia, Adult) La bradicardia es la frecuencia cardaca ms lenta que lo normal. El corazn late [employer] 60 y 100 veces por minuto en un adulto, en reposo. Si hay bradicardia, la frecuencia cardaca en reposo es inferior a 60latidos por minuto. La bradicardia puede impedir que llegue la cantidad suficiente de oxgeno a ciertas zonas del cuerpo cuando est realizando [place]. Puede ser grave si impide que llegue la suficiente cantidad de oxgeno al cerebro y a otras partes del cuerpo. La bradicardia no representa un problema para todas las personas. Para algunos adultos sanos, una frecuencia cardaca lenta en reposo es normal. CAUSAS Esta afeccin puede ser causada por lo siguiente:  Problemas con el corazn, estos incluyen:  Un problema con el sistema elctrico del corazn, por ejemplo, un bloqueo cardaco.  Un problema con el marcapasos natural del corazn (ndulo sinusal).  Cardiopata coronaria.  Infarto de miocardio.  Dao Caryl Ada infeccin cardaca.  Una enfermedad cardaca que est presente al nacer (defecto cardaco congnito).  Determinados medicamentos para el tratamiento de afecciones cardacas.  Determinadas afecciones, como hipotiroidismo y apnea obstructiva del sueo.  Problemas con el equilibrio de sustancias qumicas y otras sustancias en la sangre, como el Ramona. FACTORES DE RIESGO Es ms probable que esta afeccin se manifieste en adultos con estas caractersticas:  Tienen 63aos o ms.  Tienen presin arterial elevada (hipertensin), colesterol alto (hiperlipidemia) o diabetes.  Toman mucho alcohol, consumen productos con tabaco o nicotina o consumen drogas.  Estn estresados. SNTOMAS Los sntomas de esta afeccin incluyen lo siguiente:  Sensacin de desvanecimiento.  Desmayo o sensacin de desvanecimiento.  Fatiga y debilidad.  Falta de  aire.  Dolor en el pecho (angina).  Somnolencia.  Confusin.  Mareos. DIAGNSTICO Esta afeccin se puede diagnosticar en funcin de lo siguiente:  Sus sntomas.  Sus antecedentes mdicos.  Un examen fsico. Durante el examen, el mdico auscultar la frecuencia cardaca y controlar el pulso. El mdico tambin puede indicarle estudios para confirmar el diagnstico, como los siguientes:  Anlisis de [place].  Un electrocardiograma (ECG). El electrocardiograma registra la actividad elctrica del corazn. El estudio puede mostrar la velocidad con la que late el corazn y si la frecuencia cardaca se mantiene.  Un estudio en el que se utiliza un dispositivo porttil (grabadora de eventos) o Holter para registrar la actividad elctrica del corazn durante sus actividades diarias.  Una prueba de ejercicio. TRATAMIENTO El tratamiento de esta afeccin depende de la intensidad de los sntomas y de la causa de Astronomer. El tratamiento puede incluir lo siguiente:  Tratamiento de la afeccin preexistente.  Cambiar los medicamentos o la dosis de [place].  Implantar debajo de la piel un dispositivo a batera pequeo llamado marcapasos. Cuando se produce la bradicardia, este dispositivo se [place] para aumentar el ritmo cardaco y ayudar a que el corazn lata a un ritmo constante. INSTRUCCIONES PARA EL CUIDADO EN EL HOGAR Estilo de vida  Siga las indicaciones del mdico para controlar cualquier enfermedad que contribuya a la bradicardia.  Siga una dieta cardiosaludable. El nutricionista puede darle instrucciones sobre opciones de alimentos saludables y los cambios correspondientes.  Siga un programa de actividad fsica aprobado por el mdico.  Mantenga un peso saludable.  Trate de reducir o Geophysical data processor de estrs con actividades como el yoga o la meditacin. Si necesita ayuda para reducir [employer] de estrs, consulte al mdico.  No consuma ningn  producto que contenga nicotina o  tabaco, lo que incluye cigarrillos y Administrator, Civil Service. Si necesita ayuda para dejar de fumar, consulte al mdico.  No consuma drogas.  Limite el consumo de alcohol a no ms de 1 medida por da si es mujer y no est Orthoptist, y 2 medidas si es hombre. Una medida equivale a 12onzas de cerveza, 5onzas de vino o 1onzas de bebidas alcohlicas de alta graduacin. Instrucciones generales  [employer] de venta libre y los recetados solamente como se lo haya indicado el mdico.  Oceanographer a todas las visitas de control como se lo haya indicado el mdico. Esto es importante. PREVENCIN En algunos casos, la bradicardia se puede evitar a travs de:  El tratamiento de problemas mdicos preexistentes.  Evitar las conductas o medicamentos que pueden Teacher, music. SOLICITE ATENCIN MDICA SI:  Se siente mareado o siente que va a desvanecerse.  Casi se desmaya.  Se siente dbil o se fatiga con facilidad durante la actividad fsica.  Siente confusin o tiene problemas de [place]. SOLICITE ATENCIN MDICA DE INMEDIATO SI:  Se desmaya.  Tiene latidos cardacos irregulares (palpitaciones).  Siente dolor en el pecho.  Tiene dificultad para respirar. Esta informacin no tiene Theme park manager el consejo del mdico. Asegrese de hacerle al mdico cualquier pregunta que tenga. Document Released: 01/31/2009 Document Revised: 11/05/2014 Document Reviewed: 04/05/2016 Elsevier Interactive Patient Education  2017 Elsevier Inc.    Celulitis en los adultos (Cellulitis, Adult) La celulitis es una infeccin de la piel. La zona infectada generalmente est de color rojo y Passenger transport manager. Esta afeccin ocurre con ms frecuencia en los brazos y en las piernas. Es importante realizar un tratamiento para Copy. CUIDADOS EN EL HOGAR  Tome los medicamentos de venta libre y los recetados solamente como se lo haya indicado el mdico.  Si le recetaron un antibitico,  tmelo como se lo haya indicado el mdico. No deje de tomar los antibiticos aunque comience a sentirse mejor.  Beba suficiente lquido para mantener el pis (orina) claro o de color amarillo plido.  No toque ni frote la zona infectada.  Cuando est sentado o acostado, levante (eleve) la zona infectada por encima del nivel del corazn.  Colquese paos hmedos tibios o fros (compresas tibias o fras) sobre la zona infectada. Hgalo como se lo haya indicado el mdico.  Concurra a todas las visitas de control como se lo haya indicado el mdico. Esto es importante. Estas visitas le permiten al mdico asegurarse de que la infeccin no est empeorando. SOLICITE AYUDA SI:  Tiene fiebre.  Los sntomas no mejoran despus de 1 o 2das de tratamiento.  El hueso o la articulacin que se encuentran debajo de la zona infectada empiezan a dolerle despus de que la piel se cura.  La infeccin regresa. Esto puede ocurrir en la misma zona o en otra.  Tiene un bulto hinchado en la zona infectada.  Aparecen nuevos sntomas.  Se siente enfermo y tambin tiene [employer]. SOLICITE AYUDA DE INMEDIATO SI:  Los sntomas empeoran.  Se siente muy somnoliento.  Devuelve vomita o tiene deposiciones acuosas (diarrea).  Tiene lneas rojas que salen desde la zona infectada.  La zona de color rojo se agranda.  La zona de color rojo se oscurece. Esta informacin no tiene Theme park manager el consejo del mdico. Asegrese de hacerle al mdico cualquier pregunta que tenga. Document Released: 04/04/2010 Document Revised: 02/06/2016 Document Reviewed: 08/24/2015 Elsevier Interactive Patient Education  2017 [employer].

## 2017-02-28 NOTE — Progress Notes (Signed)
Patient is here for f/up Patient has taking his med for today  Patient has not eaten for today  Patient denies pain for today

## 2017-03-01 ENCOUNTER — Ambulatory Visit: Payer: Self-pay | Admitting: Cardiovascular Disease

## 2017-03-01 LAB — LIPID PANEL
CHOL/HDL RATIO: 5 ratio (ref 0.0–5.0)
CHOLESTEROL TOTAL: 144 mg/dL (ref 100–199)
HDL: 29 mg/dL — ABNORMAL LOW (ref >39)
LDL CALC: 84 mg/dL (ref 0–99)
TRIGLYCERIDES: 156 mg/dL — AB (ref 0–149)
VLDL CHOLESTEROL CAL: 31 mg/dL (ref 5–40)

## 2017-03-06 ENCOUNTER — Other Ambulatory Visit: Payer: Self-pay | Admitting: Family Medicine

## 2017-03-06 ENCOUNTER — Telehealth: Payer: Self-pay

## 2017-03-06 DIAGNOSIS — E782 Mixed hyperlipidemia: Secondary | ICD-10-CM

## 2017-03-06 DIAGNOSIS — I252 Old myocardial infarction: Secondary | ICD-10-CM

## 2017-03-06 DIAGNOSIS — E785 Hyperlipidemia, unspecified: Secondary | ICD-10-CM

## 2017-03-06 DIAGNOSIS — E1169 Type 2 diabetes mellitus with other specified complication: Secondary | ICD-10-CM

## 2017-03-06 MED ORDER — ASPIRIN 325 MG PO TABS: 325.0000 mg | ORAL_TABLET | Freq: Every day | ORAL | 3 refills | Status: DC

## 2017-03-06 MED ORDER — ATORVASTATIN CALCIUM 80 MG PO TABS: 80.0000 mg | ORAL_TABLET | Freq: Every day | ORAL | 0 refills | Status: DC

## 2017-03-06 NOTE — Telephone Encounter (Signed)
-----   Message from Lizbeth Bark, FNP sent at 03/06/2017  1:11 PM EDT ----- Lipid levels have improved since previous level was drawn. However are still elevated. Lipid levels were elevated. This can increase your risk of heart disease. You will be prescribed atorvastatin and aspirin to help lower risk. Start eating a diet low in saturated fat. Limit your intake of fried foods, red meats, and whole milk. Increase physical activity. Recommend follow up in 3 months

## 2017-03-06 NOTE — Telephone Encounter (Signed)
CMA call regarding lab results   Patient did not answer but left a Vm stating the reason of the call & to call back  

## 2017-03-12 ENCOUNTER — Ambulatory Visit: Payer: Self-pay | Admitting: Cardiovascular Disease

## 2017-03-14 ENCOUNTER — Ambulatory Visit: Payer: Self-pay | Admitting: Physician Assistant

## 2017-03-22 ENCOUNTER — Ambulatory Visit: Payer: Self-pay | Admitting: Student

## 2017-03-26 ENCOUNTER — Encounter: Payer: Self-pay | Admitting: Cardiovascular Disease

## 2017-03-26 ENCOUNTER — Ambulatory Visit (INDEPENDENT_AMBULATORY_CARE_PROVIDER_SITE_OTHER): Payer: Self-pay | Admitting: Cardiovascular Disease

## 2017-03-26 VITALS — BP 110/62 | HR 61 | Ht 67.0 in | Wt 183.6 lb

## 2017-03-26 DIAGNOSIS — I2510 Atherosclerotic heart disease of native coronary artery without angina pectoris: Secondary | ICD-10-CM

## 2017-03-26 NOTE — Progress Notes (Signed)
Mr. Isaac Ray presents today for follow-up. He does not speak [place] and does not have an interpreter. We will obtain a 2-D echocardiogram and bring him back in 4-6 weeks one an interpreter is available.

## 2017-04-11 ENCOUNTER — Other Ambulatory Visit (HOSPITAL_COMMUNITY): Payer: Self-pay

## 2017-04-16 ENCOUNTER — Ambulatory Visit: Payer: Self-pay | Admitting: Cardiovascular Disease

## 2019-01-21 ENCOUNTER — Encounter (HOSPITAL_COMMUNITY): Payer: Self-pay | Admitting: Pharmacy Technician

## 2019-01-21 ENCOUNTER — Emergency Department (HOSPITAL_COMMUNITY)
Admission: EM | Admit: 2019-01-21 | Discharge: 2019-01-21 | Disposition: A | Discharge status: Home / Self Care | Payer: Self-pay | Attending: Emergency Medicine | Admitting: Emergency Medicine

## 2019-01-21 ENCOUNTER — Emergency Department (HOSPITAL_COMMUNITY): Payer: Self-pay

## 2019-01-21 ENCOUNTER — Other Ambulatory Visit: Payer: Self-pay

## 2019-01-21 DIAGNOSIS — Z79899 Other long term (current) drug therapy: Secondary | ICD-10-CM | POA: Insufficient documentation

## 2019-01-21 DIAGNOSIS — Z7982 Long term (current) use of aspirin: Secondary | ICD-10-CM | POA: Insufficient documentation

## 2019-01-21 DIAGNOSIS — I10 Essential (primary) hypertension: Secondary | ICD-10-CM | POA: Insufficient documentation

## 2019-01-21 DIAGNOSIS — Z7984 Long term (current) use of oral hypoglycemic drugs: Secondary | ICD-10-CM | POA: Insufficient documentation

## 2019-01-21 DIAGNOSIS — Z87891 Personal history of nicotine dependence: Secondary | ICD-10-CM | POA: Insufficient documentation

## 2019-01-21 DIAGNOSIS — L989 Disorder of the skin and subcutaneous tissue, unspecified: Secondary | ICD-10-CM | POA: Insufficient documentation

## 2019-01-21 DIAGNOSIS — E119 Type 2 diabetes mellitus without complications: Secondary | ICD-10-CM | POA: Insufficient documentation

## 2019-01-21 DIAGNOSIS — L089 Local infection of the skin and subcutaneous tissue, unspecified: Secondary | ICD-10-CM

## 2019-01-21 LAB — CBC WITH DIFFERENTIAL/PLATELET
Abs Immature Granulocytes: 0.05 10*3/uL (ref 0.00–0.07)
BASOS ABS: 0 10*3/uL (ref 0.0–0.1)
Basophils Relative: 0 %
EOS PCT: 3 %
Eosinophils Absolute: 0.4 10*3/uL (ref 0.0–0.5)
HEMATOCRIT: 40.3 % (ref 39.0–52.0)
Hemoglobin: 13.1 g/dL (ref 13.0–17.0)
Immature Granulocytes: 0 %
LYMPHS ABS: 3.8 10*3/uL (ref 0.7–4.0)
Lymphocytes Relative: 32 %
MCH: 30.1 pg (ref 26.0–34.0)
MCHC: 32.5 g/dL (ref 30.0–36.0)
MCV: 92.6 fL (ref 80.0–100.0)
Monocytes Absolute: 0.6 10*3/uL (ref 0.1–1.0)
Monocytes Relative: 6 %
NRBC: 0 % (ref 0.0–0.2)
Neutro Abs: 6.8 10*3/uL (ref 1.7–7.7)
Neutrophils Relative %: 59 %
Platelets: 273 10*3/uL (ref 150–400)
RBC: 4.35 MIL/uL (ref 4.22–5.81)
RDW: 13.2 % (ref 11.5–15.5)
WBC: 11.7 10*3/uL — ABNORMAL HIGH (ref 4.0–10.5)

## 2019-01-21 LAB — BASIC METABOLIC PANEL
Anion gap: 9 (ref 5–15)
BUN: 10 mg/dL (ref 6–20)
CO2: 21 mmol/L — AB (ref 22–32)
Calcium: 9.5 mg/dL (ref 8.9–10.3)
Chloride: 109 mmol/L (ref 98–111)
Creatinine, Ser: 0.98 mg/dL (ref 0.61–1.24)
GFR calc non Af Amer: >60 mL/min (ref >60)
Glucose, Bld: 91 mg/dL (ref 70–99)
Potassium: 3.9 mmol/L (ref 3.5–5.1)
SODIUM: 139 mmol/L (ref 135–145)

## 2019-01-21 LAB — CBG MONITORING, ED: GLUCOSE-CAPILLARY: 82 mg/dL (ref 70–99)

## 2019-01-21 MED ORDER — DOXYCYCLINE HYCLATE 100 MG PO TABS
100.0000 mg | ORAL_TABLET | Freq: Once | ORAL | Status: AC
  Administered 2019-01-21: 100 mg via ORAL
  Filled 2019-01-21: qty 1

## 2019-01-21 MED ORDER — DOXYCYCLINE HYCLATE 100 MG PO CAPS: 100.0000 mg | ORAL_CAPSULE | Freq: Two times a day (BID) | ORAL | 0 refills | Status: AC

## 2019-01-21 MED ORDER — OXYCODONE-ACETAMINOPHEN 5-325 MG PO TABS: 1.0000 | ORAL_TABLET | Freq: Three times a day (TID) | ORAL | 0 refills | Status: DC | PRN

## 2019-01-21 NOTE — ED Provider Notes (Signed)
[facility] EMERGENCY DEPARTMENT Provider Note   CSN: [id number] Arrival date & time: 01/21/19  1537    History   Chief Complaint Chief Complaint  Patient presents with   Foot Pain    HPI Isaac Ray is a 48 y.o. male with a past medical history of HTN, HLD, NIDDM, who presents to ED for left 4th digit discomfort for the past few weeks.  States that symptoms began 1 month ago.  He had a sore/blister that developed on the lateral side of this toe which he peeled off.  Since then he has had progressive worsening redness and discomfort.  He happened to be in [place] 3 weeks ago and was evaluated by provider there.  He was told that it could be vascular vs infectious but he was placed on IM antibiotics (which he believes was ?amoxicillin) for 6 doses.  He has had improvement in appearance of the toe but for the past several days been causing him more discomfort at night.  He has not been taking any medications to help with pain.  He denies any fevers, chills, prior amputations, injury to area.     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 ;  Surgeon: [name], MD;  Location: [facility]  OR;  Service: Open Heart Surgery;  Laterality: N/A;   LEFT HEART CATHETERIZATION WITH CORONARY ANGIOGRAM N/A 02/14/2015   Procedure: LEFT HEART CATHETERIZATION WITH CORONARY ANGIOGRAM;  Surgeon: Runell Gess, MD;  [facility] CATH LAB;  Service: Cardiovascular;  Laterality: N/A;   RADIAL ARTERY HARVEST Left 02/16/2015   Procedure: RADIAL ARTERY HARVEST;  Surgeon: [name], MD;  Location: [facility] OR;  Service: Open Heart Surgery;  Laterality: Left;   TEE WITHOUT CARDIOVERSION N/A 02/16/2015   Procedure: TRANSESOPHAGEAL ECHOCARDIOGRAM (TEE);  Surgeon: [name], MD;  Location: [facility] OR;  Service: Open Heart Surgery;  Laterality: N/A;        Home Medications    Prior to Admission medications   Medication Sig Start Date End Date Taking? Authorizing Provider  aspirin 325 MG tablet Take 1 tablet (325 mg total) by mouth daily. 03/06/17   Lizbeth Bark, FNP  atorvastatin (LIPITOR) 80 MG tablet Take 1 tablet (80 mg total) by mouth daily. 03/06/17   Lizbeth Bark, FNP  carvedilol (COREG) 3.125 MG tablet Take 1 tablet (3.125 mg total) by mouth 2 (two) times daily with a meal. 02/28/17   Hairston, Oren Beckmann, FNP  doxycycline (VIBRAMYCIN) 100 MG capsule Take 1 capsule (100 mg total) by mouth 2 (two) times daily for 7 days. 01/21/19 01/28/19  Garlin Batdorf, PA-C  glipiZIDE (GLUCOTROL XL) 2.5 MG 24 hr tablet Take 1 tablet (2.5 mg  total) by mouth daily with breakfast. 02/28/17   Lizbeth Bark, FNP  isosorbide mononitrate (IMDUR) 30 MG 24 hr tablet Take 1 tablet (30 mg total) by mouth daily. 12/05/16   Vivianne Master, PA-C  lisinopril (PRINIVIL,ZESTRIL) 10 MG tablet Take 1 tablet (10 mg total) by mouth daily. 02/28/17   Lizbeth Bark, FNP  metFORMIN (GLUCOPHAGE) 1000 MG tablet Take 1 tablet (1,000 mg total) by mouth 2 (two) times daily with a meal. 02/28/17   Hairston, Oren Beckmann, FNP  ondansetron (ZOFRAN) 4 MG tablet Take 1 tablet (4 mg total) by mouth every 8 (eight) hours as  needed for nausea or vomiting. 01/06/17   Trixie Dredge, PA-C  oxyCODONE-acetaminophen (PERCOCET/ROXICET) 5-325 MG tablet Take 1 tablet by mouth every 8 (eight) hours as needed for severe pain. 01/21/19   Erisha Paugh, PA-C  PRESCRIPTION MEDICATION Take 200 mg by mouth See admin instructions. Take 2 tablets (200 mg) Diclofenaco (from [place]) daily as needed for pain    [provider]    Family History Family History  Problem Relation Age of Onset   Coronary artery disease Father        had CABG    Social History Social History   Tobacco Use   Smoking status: Former Smoker    Last attempt to quit: 02/16/2015    Years since quitting: 3.9   Smokeless tobacco: Never Used  Substance Use Topics   Alcohol use: No    Alcohol/week: 0.0 standard drinks   Drug use: No     Allergies   Patient has no known allergies.   Review of Systems Review of Systems  Constitutional: Negative for appetite change, chills and fever.  HENT: Negative for ear pain, rhinorrhea, sneezing and sore throat.   Eyes: Negative for photophobia and visual disturbance.  Respiratory: Negative for cough, chest tightness, shortness of breath and wheezing.   Cardiovascular: Negative for chest pain and palpitations.  Gastrointestinal: Negative for abdominal pain, blood in stool, constipation, diarrhea, nausea and vomiting.  Genitourinary: Negative for dysuria, hematuria and urgency.  Musculoskeletal: Negative for myalgias.  Skin: Positive for wound. Negative for rash.  Neurological: Negative for dizziness, weakness and light-headedness.     Physical Exam Updated Vital Signs BP 102/64    Pulse 71    Temp 97.8 F (36.6 C) (Oral)    Resp 16    SpO2 95%   Physical Exam Vitals signs and nursing note reviewed.  Constitutional:      General: He is not in acute distress.    Appearance: He is well-developed.  HENT:     Head: Normocephalic and atraumatic.     Nose: Nose normal.  Eyes:     General: No  scleral icterus.       Right eye: No discharge.        Left eye: No discharge.     Conjunctiva/sclera: Conjunctivae normal.  Neck:     Musculoskeletal: Normal range of motion and neck supple.  Cardiovascular:     Rate and Rhythm: Normal rate and regular rhythm.     Heart sounds: Normal heart sounds. No murmur. No friction rub. No gallop.   Pulmonary:     Effort: Pulmonary effort is normal. No respiratory distress.     Breath sounds: Normal breath sounds.  Abdominal:     General: Bowel sounds are normal. There is no distension.     Palpations: Abdomen is soft.     Tenderness: There is no abdominal tenderness. There is  no guarding.  Musculoskeletal: Normal range of motion.  Skin:    General: Skin is warm and dry.     Capillary Refill: Capillary refill takes less than 2 seconds.     Findings: Wound present. No rash.     Comments: 2+ DP pulse on L foot. No temperature change of digit. Erythema noted in image.  Wound noted on lateral aspect of toe. No streaking or edema noted.  Neurological:     Mental Status: He is alert.     Motor: No abnormal muscle tone.     Coordination: Coordination normal.            ED Treatments / Results  Labs (all labs ordered are listed, but only abnormal results are displayed) Labs Reviewed  BASIC METABOLIC PANEL - Abnormal; Notable for the following components:      Result Value   CO2 21 (*)    All other components within normal limits  CBC WITH DIFFERENTIAL/PLATELET - Abnormal; Notable for the following components:   WBC 11.7 (*)    All other components within normal limits  CBG MONITORING, ED    EKG None  Radiology Dg Foot Complete Left  Result Date: 01/21/2019 CLINICAL DATA:  Pain in left foot, particularly the big toe. Concern for infection. EXAM: LEFT FOOT - COMPLETE 3+ VIEW COMPARISON:  None. FINDINGS: There is no evidence of fracture or dislocation. There is no evidence of arthropathy or other focal bone abnormality. Soft  tissues are unremarkable. IMPRESSION: No acute abnormalities. No evidence of soft tissue gas or bony erosion. Electronically Signed   By: Gerome Sam III M.D   On: 01/21/2019 16:34    Procedures Procedures (including critical care time)  Medications Ordered in ED Medications  doxycycline (VIBRA-TABS) tablet 100 mg (100 mg Oral Given 01/21/19 1700)     Initial Impression / Assessment and Plan / ED Course  I have reviewed the triage vital signs and the nursing notes.  Pertinent labs & imaging results that were available during my care of the patient were reviewed by me and considered in my medical decision making (see chart for details).        48 year old male with a past medical history of HTN, NIDDM, HLD presents for discomfort in left fourth toe.  Symptoms going on total for 1 month but appearance has gradually improved.  Started out with a blister which he picked off.  Treated with IM antibiotics in [place] 3 weeks ago.  The only reason he is concerned because discomfort is getting worse at night.  Denies any systemic complaints.  No trauma to the area.  No history of osteomyelitis or amputation in the past.  No recent use of antipyretics.  Physical exam findings noted above, showing wound on lateral aspect of toe.  Good capillary refill..  Area is neurovascularly intact, no temperature changes.  2+ DP pulse noted.  Able to move digits without difficulty.  Lab work shows leukocytosis of 11.7 which is likely reactive from his infection.  BMP unremarkable.  X-ray shows no signs of osteomyelitis or gas.  I am afraid that the antibiotics that he was placed on in [place] were not sufficient enough to cover for this infection.  He believes he was on amoxicillin.  Will place on doxycycline as he has no signs of systemic illness and no signs of osteomyelitis.  We will have him follow with wound clinic and return to ED for any severe worsening symptoms.  Patient is hemodynamically stable, in  NAD,  and able to ambulate in the ED. Evaluation does not show pathology that would require ongoing emergent intervention or inpatient treatment. I explained the diagnosis to the patient. Pain has been managed and has no complaints prior to discharge. Patient is comfortable with above plan and is stable for discharge at this time. All questions were answered prior to disposition. Strict return precautions for returning to the ED were discussed. Encouraged follow up with PCP.    Portions of this note were generated with Scientist, clinical (histocompatibility and immunogenetics). Dictation errors may occur despite best attempts at proofreading.   Final Clinical Impressions(s) / ED Diagnoses   Final diagnoses:  Toe infection    ED Discharge Orders         Ordered    doxycycline (VIBRAMYCIN) 100 MG capsule  2 times daily     01/21/19 1709    oxyCODONE-acetaminophen (PERCOCET/ROXICET) 5-325 MG tablet  Every 8 hours PRN     01/21/19 1709           Dietrich Pates, PA-C 01/21/19 1716    Gwyneth Sprout, MD 01/21/19 [id number]

## 2019-01-21 NOTE — ED Notes (Signed)
Patient transported to X-ray 

## 2019-01-21 NOTE — ED Triage Notes (Signed)
Pt arrives via pov with reports of pain to L foot X3 weeks.

## 2019-01-21 NOTE — Discharge Instructions (Signed)
Take the doxycycline to help with your infection.  Please complete the entire course of this medicine even if you start to feel better to prevent worsening or recurrence of your infection. Take the pain medication as needed. Follow-up at the wound clinic listed below. Return to the ED if you start to develop worsening wound, red streaks in your foot, a cold foot or toe, fevers.

## 2019-01-21 NOTE — ED Notes (Signed)
Patient verbalizes understanding of discharge instructions. Opportunity for questioning and answers were provided. 

## 2019-02-03 ENCOUNTER — Other Ambulatory Visit: Payer: Self-pay

## 2019-02-03 ENCOUNTER — Encounter (HOSPITAL_BASED_OUTPATIENT_CLINIC_OR_DEPARTMENT_OTHER): Payer: Self-pay | Attending: Internal Medicine

## 2019-02-03 DIAGNOSIS — E1151 Type 2 diabetes mellitus with diabetic peripheral angiopathy without gangrene: Secondary | ICD-10-CM | POA: Insufficient documentation

## 2019-02-03 DIAGNOSIS — F17210 Nicotine dependence, cigarettes, uncomplicated: Secondary | ICD-10-CM | POA: Insufficient documentation

## 2019-02-03 DIAGNOSIS — I7300 Raynaud's syndrome without gangrene: Secondary | ICD-10-CM | POA: Insufficient documentation

## 2019-02-03 DIAGNOSIS — I10 Essential (primary) hypertension: Secondary | ICD-10-CM | POA: Insufficient documentation

## 2019-02-03 DIAGNOSIS — I2510 Atherosclerotic heart disease of native coronary artery without angina pectoris: Secondary | ICD-10-CM | POA: Insufficient documentation

## 2019-02-03 DIAGNOSIS — L97523 Non-pressure chronic ulcer of other part of left foot with necrosis of muscle: Secondary | ICD-10-CM | POA: Insufficient documentation

## 2019-02-03 DIAGNOSIS — I252 Old myocardial infarction: Secondary | ICD-10-CM | POA: Insufficient documentation

## 2019-02-03 DIAGNOSIS — E11621 Type 2 diabetes mellitus with foot ulcer: Secondary | ICD-10-CM | POA: Insufficient documentation

## 2019-02-06 ENCOUNTER — Other Ambulatory Visit (HOSPITAL_COMMUNITY): Payer: Self-pay | Admitting: Internal Medicine

## 2019-02-06 DIAGNOSIS — I779 Disorder of arteries and arterioles, unspecified: Secondary | ICD-10-CM

## 2019-02-09 ENCOUNTER — Encounter (HOSPITAL_COMMUNITY): Payer: Self-pay

## 2019-02-10 ENCOUNTER — Other Ambulatory Visit (HOSPITAL_COMMUNITY): Payer: Self-pay | Admitting: Internal Medicine

## 2019-02-10 ENCOUNTER — Ambulatory Visit (HOSPITAL_COMMUNITY)
Admission: RE | Admit: 2019-02-10 | Discharge: 2019-02-10 | Disposition: A | Discharge status: Home / Self Care | Payer: Self-pay | Source: Ambulatory Visit | Attending: Internal Medicine | Admitting: Internal Medicine

## 2019-02-10 DIAGNOSIS — I779 Disorder of arteries and arterioles, unspecified: Secondary | ICD-10-CM

## 2019-02-13 ENCOUNTER — Other Ambulatory Visit: Payer: Self-pay

## 2019-02-13 ENCOUNTER — Ambulatory Visit (HOSPITAL_COMMUNITY)
Admission: RE | Admit: 2019-02-13 | Discharge: 2019-02-13 | Disposition: A | Discharge status: Home / Self Care | Payer: Self-pay | Source: Ambulatory Visit | Attending: Internal Medicine | Admitting: Internal Medicine

## 2019-02-13 DIAGNOSIS — I779 Disorder of arteries and arterioles, unspecified: Secondary | ICD-10-CM | POA: Insufficient documentation

## 2019-02-16 ENCOUNTER — Telehealth: Payer: Self-pay | Admitting: *Deleted

## 2019-02-16 NOTE — Telephone Encounter (Signed)
Spoke with patient and he rescheduled his appointment from tomorrow to June 23, 2019 at 10:15 am.

## 2019-02-17 ENCOUNTER — Ambulatory Visit: Payer: Self-pay | Admitting: Cardiovascular Disease

## 2019-02-26 ENCOUNTER — Other Ambulatory Visit: Payer: Self-pay

## 2019-02-26 ENCOUNTER — Ambulatory Visit
Admission: RE | Admit: 2019-02-26 | Discharge: 2019-02-26 | Disposition: A | Discharge status: Home / Self Care | Payer: Self-pay | Source: Ambulatory Visit | Attending: Internal Medicine | Admitting: Internal Medicine

## 2019-02-26 DIAGNOSIS — S91109A Unspecified open wound of unspecified toe(s) without damage to nail, initial encounter: Secondary | ICD-10-CM

## 2019-03-03 ENCOUNTER — Telehealth: Payer: Self-pay | Admitting: Cardiovascular Disease

## 2019-03-03 ENCOUNTER — Encounter (HOSPITAL_BASED_OUTPATIENT_CLINIC_OR_DEPARTMENT_OTHER): Payer: Self-pay | Attending: Internal Medicine

## 2019-03-03 DIAGNOSIS — L97523 Non-pressure chronic ulcer of other part of left foot with necrosis of muscle: Secondary | ICD-10-CM | POA: Insufficient documentation

## 2019-03-03 DIAGNOSIS — E1169 Type 2 diabetes mellitus with other specified complication: Secondary | ICD-10-CM | POA: Insufficient documentation

## 2019-03-03 DIAGNOSIS — E1151 Type 2 diabetes mellitus with diabetic peripheral angiopathy without gangrene: Secondary | ICD-10-CM | POA: Insufficient documentation

## 2019-03-03 DIAGNOSIS — M86671 Other chronic osteomyelitis, right ankle and foot: Secondary | ICD-10-CM | POA: Insufficient documentation

## 2019-03-03 DIAGNOSIS — E11621 Type 2 diabetes mellitus with foot ulcer: Secondary | ICD-10-CM | POA: Insufficient documentation

## 2019-03-03 NOTE — Telephone Encounter (Signed)
Probably needs to see Allyson Sabal or Kirke Corin - I would forward the message to one of them and let them decide the best course of action.  Thanks  DR Rexene Edison

## 2019-03-03 NOTE — Telephone Encounter (Signed)
°  Sheralyn Boatman requesting to speak with DOD   Sheralyn Boatman from the wound center (Dr Leanord Hawking) calling to request ASAP "in office" visit for patient sooner than 5/26.  Please call Sheralyn Boatman at [phone]

## 2019-03-03 NOTE — Telephone Encounter (Addendum)
Isaac Ray was notified of DOD recommendations. Message has been routed to Dr. Allyson Sabal as he has seen patient in the past  Please see messages below concerning Dr. Allyson Sabal and need for PV eval per Dr. Leanord Hawking

## 2019-03-03 NOTE — Telephone Encounter (Signed)
Spoke with Sheralyn Boatman who reports Dr. Leanord Hawking wanted patient to see Dr. Allyson Sabal for consult as patient has toe ischemia. Dopplers are in Epic. Sheralyn Boatman is aware that Dr. Allyson Sabal will not be able to see patient until end of the month when he is DOD and that Dr. Kirke Corin (other PV specialist) is not DOD in [place] and she states patient cannot travel to [place]. She states Dr. Leanord Hawking wants patient to see someone sooner than scheduled OV of 5/26. Advised would notify in-office DOD and call back.   Patient is established patient of Dr. Allyson Sabal - last seen 02/2017

## 2019-03-09 ENCOUNTER — Telehealth: Payer: Self-pay

## 2019-03-09 NOTE — Telephone Encounter (Signed)
Spoke with [place] at [phone]. Permanent Comment under Contacts lists her as pt interpreter. Informed her that pt new appt date and time is on 5/13, at 1:30pm in the office and provided location of [place] office. She states this time will be fine for the  Patient and she will relay information to him.

## 2019-03-11 ENCOUNTER — Telehealth: Payer: Self-pay

## 2019-03-11 ENCOUNTER — Ambulatory Visit: Payer: Self-pay | Admitting: Cardiovascular Disease

## 2019-03-11 NOTE — Telephone Encounter (Signed)
Spoke with Maud Deed, pt interpreter and informed her of the new appointment time of 9:45am on 5/26. She verbalized understanding and will relay update to patient

## 2019-03-12 NOTE — Telephone Encounter (Signed)
Per 5/13 telephone note, pt interpreter [facility] aware of appt on Dr. Allyson Sabal DOD day May 26 and will inform pt

## 2019-03-24 ENCOUNTER — Encounter: Payer: Self-pay | Admitting: Cardiovascular Disease

## 2019-03-24 ENCOUNTER — Ambulatory Visit (INDEPENDENT_AMBULATORY_CARE_PROVIDER_SITE_OTHER): Payer: Self-pay | Admitting: Cardiovascular Disease

## 2019-03-24 ENCOUNTER — Ambulatory Visit (HOSPITAL_COMMUNITY)
Admission: RE | Admit: 2019-03-24 | Discharge: 2019-03-24 | Disposition: A | Discharge status: Home / Self Care | Payer: Self-pay | Source: Ambulatory Visit | Attending: Cardiovascular Disease | Admitting: Cardiovascular Disease

## 2019-03-24 ENCOUNTER — Ambulatory Visit: Payer: Self-pay | Admitting: Cardiovascular Disease

## 2019-03-24 ENCOUNTER — Other Ambulatory Visit: Payer: Self-pay

## 2019-03-24 VITALS — BP 116/70 | HR 53 | Temp 98.0°F | Ht 67.0 in | Wt 164.0 lb

## 2019-03-24 DIAGNOSIS — I779 Disorder of arteries and arterioles, unspecified: Secondary | ICD-10-CM

## 2019-03-24 DIAGNOSIS — E1169 Type 2 diabetes mellitus with other specified complication: Secondary | ICD-10-CM

## 2019-03-24 DIAGNOSIS — Z01812 Encounter for preprocedural laboratory examination: Secondary | ICD-10-CM

## 2019-03-24 DIAGNOSIS — I10 Essential (primary) hypertension: Secondary | ICD-10-CM

## 2019-03-24 DIAGNOSIS — I998 Other disorder of circulatory system: Secondary | ICD-10-CM

## 2019-03-24 DIAGNOSIS — Z9889 Other specified postprocedural states: Secondary | ICD-10-CM

## 2019-03-24 DIAGNOSIS — E785 Hyperlipidemia, unspecified: Secondary | ICD-10-CM

## 2019-03-24 DIAGNOSIS — Z8679 Personal history of other diseases of the circulatory system: Secondary | ICD-10-CM

## 2019-03-24 DIAGNOSIS — Z959 Presence of cardiac and vascular implant and graft, unspecified: Secondary | ICD-10-CM

## 2019-03-24 DIAGNOSIS — I255 Ischemic cardiomyopathy: Secondary | ICD-10-CM | POA: Insufficient documentation

## 2019-03-24 DIAGNOSIS — I252 Old myocardial infarction: Secondary | ICD-10-CM

## 2019-03-24 DIAGNOSIS — E782 Mixed hyperlipidemia: Secondary | ICD-10-CM

## 2019-03-24 MED ORDER — ASPIRIN EC 81 MG PO TBEC: 81.0000 mg | DELAYED_RELEASE_TABLET | Freq: Every day | ORAL | 3 refills | Status: DC

## 2019-03-24 NOTE — Patient Instructions (Addendum)
[facility] CARDIOVASCULAR DIVISION [facility] [address] [place] [address] [place] [address] Dept: [phone] Loc: [phone]  Isaac Ray  03/24/2019  You are scheduled for a Peripheral Angiogram on Monday, June 1 with Dr. Nanetta Batty.  1. Please arrive at the [facility] (Main Entrance A) at [facility]: [address] [place], [place] [address] at 7:30 AM (This time is two hours before your procedure to ensure your preparation). Free valet parking service is available.   Special note: Every effort is made to have your procedure done on time. Please understand that emergencies sometimes delay scheduled procedures.  2. Diet: Do not eat solid foods after midnight.  The patient may have clear liquids until 5am upon the day of the procedure.  3. Labs: You will need to have blood drawn THIS WEEK: CBC, BMP. YOU MUST HAVE YOUR BLOOD DRAWN BEFORE GOING TO DO YOUR COVID-19 TEST.   Go to this location 3 DAYS PRIOR TO YOUR PROCEDURE for your COVID-19 test:  [facility]  [address] [place], [place] [address] YOU WILL NEED AN APPOINTMENT FOR THIS TEST. YOU WILL ALSO NEED TO QUARANTINE YOURSELF AFTER THE COVID-19 TEST UNTIL YOU RECEIVE YOUR RESULTS (NEGATIVE RESULTS). YOU SHOULD RECEIVE YOUR RESULTS IN 48 HOURS OR LESS.   4. Medication instructions in preparation for your procedure:  Do not take Diabetes Med Glucophage (Metformin) on the day of the procedure and HOLD 48 HOURS AFTER THE PROCEDURE.   Do not take glipizide (Glucotrol XL) on the morning of your procedure.   On the morning of your procedure, take your Aspirin and any morning medicines NOT listed above.  You may use sips of water.  5. Plan for one night stay--bring personal belongings. 6. Bring a current list of your medications and current insurance cards. 7. You MUST have a responsible person to drive you  home. 8. Someone MUST be with you the first 24 hours after you arrive home or your discharge will be delayed. 9. Please wear clothes that are easy to get on and off and wear slip-on shoes.  Thank you for allowing [place] to care for you!   -- [facility]   Medication Instructions:  Your physician has recommended you make the following change in your medication:  DECREASE ASPIRIN TO 81 MG BY MOUTH DAILY  If you need a refill on your cardiac medications before your next appointment, please call your pharmacy.    Testing/Procedures: Your physician has requested that you have a lower or upper extremity arterial duplex. This test is an ultrasound of the arteries in the legs or arms. It looks at arterial blood flow in the legs and arms. Allow one hour for Lower and Upper Arterial scans. There are no restrictions or special instructions. TO BE SCHEDULED 1 WEEK AFTER YOUR PROCEDURE  Your physician has requested that you have an ankle brachial index (ABI). During this test an ultrasound and blood pressure cuff are used to evaluate the arteries that supply the arms and legs with blood. Allow thirty minutes for this exam. There are no restrictions or special instructions. TO BE SCHEDULED 1 WEEK AFTER YOUR PROCEDURE   Follow-Up: At [facility], you and your health needs are our priority.  As part of our continuing mission to provide you with exceptional heart care, we have created designated Provider Care Teams.  These Care Teams include your primary  Cardiologist (physician) and Advanced Practice Providers (APPs -  Physician Assistants and Nurse Practitioners) who all work together to provide you with the care you need, when you need it. You will need a follow up appointment in 2-3 weeks WITH DR. Allyson Sabal.

## 2019-03-24 NOTE — Assessment & Plan Note (Signed)
History of non-STEMI back in 2016 followed by cardiac catheterization by myself revealing three-vessel disease with severe LV dysfunction.  2 days later he underwent CABG 5 by Dr. Dorris Fetch his LAD, sequential vein to OM1 and to left radial to diagonal branches 1 and 2.  He had a negative Myoview stress test 09/17/2018 with normal 2D echo at that time.  He denies chest pain or shortness of breath.

## 2019-03-24 NOTE — Assessment & Plan Note (Signed)
Critical limb ischemia Mr. Isaac Ray was referred by Dr. Leanord Hawking for critical limb ischemia.  He has a gangrenous left fourth toe.  He seen Dr. Imogene Burn 2 years ago for cyanosis of his left first and fifth toe.  Conservative therapy was recommended at that time.  He apparently picked a blister off of his left fourth telemedicine Chelle 4 months ago and this never healed.  He did receive some IM antibiotics there over 3-week time.Marland Kitchen  [place] in [place] emergency room in March for evaluation of this.  He has seen Dr. Leanord Hawking at the wound care center multiple occasions since that time with Dopplers that show a right ABI 0.72 and a left of 0.64 performed in our office 02/13/2019.  He does complain of claudication left greater than right over the last year.  I suspect he has multilevel disease.  I am going to get lower extremity arterial Doppler studies in our office today and will discuss with Dr. Leanord Hawking whether he is had any progress with conservative aggressive local wound care.  If not, he will need angiography potential intervention for CLI/limb salvage.

## 2019-03-24 NOTE — H&P (View-Only) (Signed)
03/24/2019 Isaac Ray   [birth year]  [id number]  Primary Physician Lizbeth Bark, FNP Primary Cardiologist: Runell Gess MD Roseanne Reno  HPI:  Isaac Ray is a 48 -year-old married Latino male with a prior history of CAD status post stenting in 2013 at [facility] in [place].  I last saw him in the office 04/20/2015.  He has a history of treated hypertension, diabetes hyperlipidemia and continued tobacco abuse. He was admitted with acute coronary syndrome and had minimally elevated enzymes with nonspecific ST and T-wave changes. I performed a diagnostic coronary arteriography via the right radial approach revealing three-vessel disease with severe LV dysfunction. 2 days later he underwent coronary artery bypass grafting by Dr. Dorris Fetch with 5 grafts placed including a LIMA to the LAD, sequential vein to OM1 and 2 and sequential left radial to diagonal branch 1 and 2. He did apparently stop smoking. A month after discharge he developed nocturnal chest pressure similar to his preintervention symptoms.  He had a normal 2D echo 09/17/2018 and Myoview stress test as well.  He stopped smoking 2 years ago.  He returned from [place] several months ago where he was since I last saw him because of a painful infected left fourth toe.  He was seen in the emergency room for this 01/21/2019.  He has been seeing Dr. Leanord Hawking as well as the wound care center.  He apparently had IM antibiotics in [place] for this prior to return to [place].  He had lower extremity arterial Doppler studies performed in our office 02/10/2019 revealing a right ABI 0.72 and a left ABI 0.64 left fourth toe pressure was abnormal.  He does complain of claudication left greater than right for the last year.   Current Meds  Medication Sig  . aspirin 325 MG tablet Take 1 tablet (325 mg total) by mouth daily.  Marland Kitchen atorvastatin (LIPITOR) 80 MG tablet Take 1 tablet (80 mg total) by mouth  daily.  . carvedilol (COREG) 3.125 MG tablet Take 1 tablet (3.125 mg total) by mouth 2 (two) times daily with a meal.  . glipiZIDE (GLUCOTROL XL) 2.5 MG 24 hr tablet Take 1 tablet (2.5 mg total) by mouth daily with breakfast.  . isosorbide mononitrate (IMDUR) 30 MG 24 hr tablet Take 1 tablet (30 mg total) by mouth daily.  Marland Kitchen lisinopril (PRINIVIL,ZESTRIL) 10 MG tablet Take 1 tablet (10 mg total) by mouth daily.  . metFORMIN (GLUCOPHAGE) 1000 MG tablet Take 1 tablet (1,000 mg total) by mouth 2 (two) times daily with a meal.  . PRESCRIPTION MEDICATION Take 200 mg by mouth See admin instructions. Take 2 tablets (200 mg) Diclofenaco (from [place]) daily as needed for pain  . [DISCONTINUED] ondansetron (ZOFRAN) 4 MG tablet Take 1 tablet (4 mg total) by mouth every 8 (eight) hours as needed for nausea or vomiting.  . [DISCONTINUED] oxyCODONE-acetaminophen (PERCOCET/ROXICET) 5-325 MG tablet Take 1 tablet by mouth every 8 (eight) hours as needed for severe pain.     No Known Allergies  Social History   Socioeconomic History  . Marital status: Single    Spouse name: Not on file  . Number of children: Not on file  . Years of education: Not on file  . Highest education level: Not on file  Occupational History  . Not on file  Social Needs  . Financial resource strain: Not on file  . Food insecurity:    Worry: Not on file  Inability: Not on file  . Transportation needs:    Medical: Not on file    Non-medical: Not on file  Tobacco Use  . Smoking status: Former Smoker    Last attempt to quit: 02/16/2015    Years since quitting: 4.1  . Smokeless tobacco: Never Used  Substance and Sexual Activity  . Alcohol use: No    Alcohol/week: 0.0 standard drinks  . Drug use: No  . Sexual activity: Not on file  Lifestyle  . Physical activity:    Days per week: Not on file    Minutes per session: Not on file  . Stress: Not on file  Relationships  . Social connections:    Talks on phone: Not on file     Gets together: Not on file    Attends religious service: Not on file    Active member of club or organization: Not on file    Attends meetings of clubs or organizations: Not on file    Relationship status: Not on file  . Intimate partner violence:    Fear of current or ex partner: Not on file    Emotionally abused: Not on file    Physically abused: Not on file    Forced sexual activity: Not on file  Other Topics Concern  . Not on file  Social History Narrative  . Not on file     Review of Systems: General: negative for chills, fever, night sweats or weight changes.  Cardiovascular: negative for chest pain, dyspnea on exertion, edema, orthopnea, palpitations, paroxysmal nocturnal dyspnea or shortness of breath Dermatological: negative for rash Respiratory: negative for cough or wheezing Urologic: negative for hematuria Abdominal: negative for nausea, vomiting, diarrhea, bright red blood per rectum, melena, or hematemesis Neurologic: negative for visual changes, syncope, or dizziness All other systems reviewed and are otherwise negative except as noted above.    Blood pressure 116/70, pulse (!) 53, temperature 98 F (36.7 C), height 5\' 7"  (1.702 m), weight 164 lb (74.4 kg).  General appearance: alert and no distress Neck: no adenopathy, no carotid bruit, no JVD, supple, symmetrical, trachea midline and thyroid not enlarged, symmetric, no tenderness/mass/nodules Lungs: clear to auscultation bilaterally Heart: regular rate and rhythm, S1, S2 normal, no murmur, click, rub or gallop Extremities: His left foot reveals a gangrenous fourth toe with cyanosis in the first and fifth toes. Pulses: Absent bilaterally Skin: Gangrenous changes left fourth toe Neurologic: Alert and oriented X 3, normal strength and tone. Normal symmetric reflexes. Normal coordination and gait  EKG sinus bradycardia 53 with a nonspecific IVCD.  Personally reviewed this EKG  ASSESSMENT AND PLAN:   S/P CABG  x 5 History of non-STEMI back in 2016 followed by cardiac catheterization by myself revealing three-vessel disease with severe LV dysfunction.  2 days later he underwent CABG 5 by Dr. Dorris Fetch his LAD, sequential vein to OM1 and to left radial to diagonal branches 1 and 2.  He had a negative Myoview stress test 09/17/2018 with normal 2D echo at that time.  He denies chest pain or shortness of breath.  Ischemic cardiomyopathy History of ischemic cardiomyopathy with an EF initially the time of bypass surgery in the 30% range performed 05/05/2015 normalization by 2D echo 09/17/2018.  Essential hypertension History of essential hypertension with blood pressure measured in the office today of 116/70.  He is on carvedilol and lisinopril.  Hyperlipidemia History of hyperlipidemia on statin therapy with lipid profile performed 02/28/2017 revealed a total cholesterol of 144, LDL of  84 and HDL 29  Critical limb ischemia with history of revascularization of same extremity Critical limb ischemia Mr. Isaac Ray was referred by Dr. Leanord Hawkingobson for critical limb ischemia.  He has a gangrenous left fourth toe.  He seen Dr. Imogene Burnhen 2 years ago for cyanosis of his left first and fifth toe.  Conservative therapy was recommended at that time.  He apparently picked a blister off of his left fourth telemedicine Chelle 4 months ago and this never healed.  He did receive some IM antibiotics there over 3-week time.Marland Kitchen.  [place] in [place] emergency room in March for evaluation of this.  He has seen Dr. Leanord Hawkingobson at the wound care center multiple occasions since that time with Dopplers that show a right ABI 0.72 and a left of 0.64 performed in our office 02/13/2019.  He does complain of claudication left greater than right over the last year.  I suspect he has multilevel disease.  I am going to get lower extremity arterial Doppler studies in our office today and will discuss with Dr. Leanord Hawkingobson whether he is had any  progress with conservative aggressive local wound care.  If not, he will need angiography potential intervention for CLI/limb salvage.      Runell GessJonathan J. Abrahm Mancia MD FACP,FACC,FAHA, [facility] 03/24/2019 10:22 AM

## 2019-03-24 NOTE — Assessment & Plan Note (Signed)
History of essential hypertension with blood pressure measured in the office today of 116/70.  He is on carvedilol and lisinopril.

## 2019-03-24 NOTE — Assessment & Plan Note (Signed)
History of hyperlipidemia on statin therapy with lipid profile performed 02/28/2017 revealed a total cholesterol of 144, LDL of 84 and HDL 29

## 2019-03-24 NOTE — Assessment & Plan Note (Signed)
History of ischemic cardiomyopathy with an EF initially the time of bypass surgery in the 30% range performed 05/05/2015 normalization by 2D echo 09/17/2018.

## 2019-03-24 NOTE — Progress Notes (Signed)
03/24/2019 Isaac Ray   [birth year]  [id number]  Primary Physician Lizbeth Bark, FNP Primary Cardiologist: Runell Gess MD Roseanne Reno  HPI:  Mr. Isaac Ray is a 48 -year-old married Latino male with a prior history of CAD status post stenting in 2013 at [facility] in [place].  I last saw him in the office 04/20/2015.  He has a history of treated hypertension, diabetes hyperlipidemia and continued tobacco abuse. He was admitted with acute coronary syndrome and had minimally elevated enzymes with nonspecific ST and T-wave changes. I performed a diagnostic coronary arteriography via the right radial approach revealing three-vessel disease with severe LV dysfunction. 2 days later he underwent coronary artery bypass grafting by Dr. Dorris Fetch with 5 grafts placed including a LIMA to the LAD, sequential vein to OM1 and 2 and sequential left radial to diagonal branch 1 and 2. He did apparently stop smoking. A month after discharge he developed nocturnal chest pressure similar to his preintervention symptoms.  He had a normal 2D echo 09/17/2018 and Myoview stress test as well.  He stopped smoking 2 years ago.  He returned from [place] several months ago where he was since I last saw him because of a painful infected left fourth toe.  He was seen in the emergency room for this 01/21/2019.  He has been seeing Dr. Leanord Hawking as well as the wound care center.  He apparently had IM antibiotics in [place] for this prior to return to [place].  He had lower extremity arterial Doppler studies performed in our office 02/10/2019 revealing a right ABI 0.72 and a left ABI 0.64 left fourth toe pressure was abnormal.  He does complain of claudication left greater than right for the last year.   Current Meds  Medication Sig  . aspirin 325 MG tablet Take 1 tablet (325 mg total) by mouth daily.  Marland Kitchen atorvastatin (LIPITOR) 80 MG tablet Take 1 tablet (80 mg total) by mouth  daily.  . carvedilol (COREG) 3.125 MG tablet Take 1 tablet (3.125 mg total) by mouth 2 (two) times daily with a meal.  . glipiZIDE (GLUCOTROL XL) 2.5 MG 24 hr tablet Take 1 tablet (2.5 mg total) by mouth daily with breakfast.  . isosorbide mononitrate (IMDUR) 30 MG 24 hr tablet Take 1 tablet (30 mg total) by mouth daily.  Marland Kitchen lisinopril (PRINIVIL,ZESTRIL) 10 MG tablet Take 1 tablet (10 mg total) by mouth daily.  . metFORMIN (GLUCOPHAGE) 1000 MG tablet Take 1 tablet (1,000 mg total) by mouth 2 (two) times daily with a meal.  . PRESCRIPTION MEDICATION Take 200 mg by mouth See admin instructions. Take 2 tablets (200 mg) Diclofenaco (from [place]) daily as needed for pain  . [DISCONTINUED] ondansetron (ZOFRAN) 4 MG tablet Take 1 tablet (4 mg total) by mouth every 8 (eight) hours as needed for nausea or vomiting.  . [DISCONTINUED] oxyCODONE-acetaminophen (PERCOCET/ROXICET) 5-325 MG tablet Take 1 tablet by mouth every 8 (eight) hours as needed for severe pain.     No Known Allergies  Social History   Socioeconomic History  . Marital status: Single    Spouse name: Not on file  . Number of children: Not on file  . Years of education: Not on file  . Highest education level: Not on file  Occupational History  . Not on file  Social Needs  . Financial resource strain: Not on file  . Food insecurity:    Worry: Not on file  Inability: Not on file  . Transportation needs:    Medical: Not on file    Non-medical: Not on file  Tobacco Use  . Smoking status: Former Smoker    Last attempt to quit: 02/16/2015    Years since quitting: 4.1  . Smokeless tobacco: Never Used  Substance and Sexual Activity  . Alcohol use: No    Alcohol/week: 0.0 standard drinks  . Drug use: No  . Sexual activity: Not on file  Lifestyle  . Physical activity:    Days per week: Not on file    Minutes per session: Not on file  . Stress: Not on file  Relationships  . Social connections:    Talks on phone: Not on file     Gets together: Not on file    Attends religious service: Not on file    Active member of club or organization: Not on file    Attends meetings of clubs or organizations: Not on file    Relationship status: Not on file  . Intimate partner violence:    Fear of current or ex partner: Not on file    Emotionally abused: Not on file    Physically abused: Not on file    Forced sexual activity: Not on file  Other Topics Concern  . Not on file  Social History Narrative  . Not on file     Review of Systems: General: negative for chills, fever, night sweats or weight changes.  Cardiovascular: negative for chest pain, dyspnea on exertion, edema, orthopnea, palpitations, paroxysmal nocturnal dyspnea or shortness of breath Dermatological: negative for rash Respiratory: negative for cough or wheezing Urologic: negative for hematuria Abdominal: negative for nausea, vomiting, diarrhea, bright red blood per rectum, melena, or hematemesis Neurologic: negative for visual changes, syncope, or dizziness All other systems reviewed and are otherwise negative except as noted above.    Blood pressure 116/70, pulse (!) 53, temperature 98 F (36.7 C), height 5\' 7"  (1.702 m), weight 164 lb (74.4 kg).  General appearance: alert and no distress Neck: no adenopathy, no carotid bruit, no JVD, supple, symmetrical, trachea midline and thyroid not enlarged, symmetric, no tenderness/mass/nodules Lungs: clear to auscultation bilaterally Heart: regular rate and rhythm, S1, S2 normal, no murmur, click, rub or gallop Extremities: His left foot reveals a gangrenous fourth toe with cyanosis in the first and fifth toes. Pulses: Absent bilaterally Skin: Gangrenous changes left fourth toe Neurologic: Alert and oriented X 3, normal strength and tone. Normal symmetric reflexes. Normal coordination and gait  EKG sinus bradycardia 53 with a nonspecific IVCD.  Personally reviewed this EKG  ASSESSMENT AND PLAN:   S/P CABG  x 5 History of non-STEMI back in 2016 followed by cardiac catheterization by myself revealing three-vessel disease with severe LV dysfunction.  2 days later he underwent CABG 5 by Dr. Dorris Fetch his LAD, sequential vein to OM1 and to left radial to diagonal branches 1 and 2.  He had a negative Myoview stress test 09/17/2018 with normal 2D echo at that time.  He denies chest pain or shortness of breath.  Ischemic cardiomyopathy History of ischemic cardiomyopathy with an EF initially the time of bypass surgery in the 30% range performed 05/05/2015 normalization by 2D echo 09/17/2018.  Essential hypertension History of essential hypertension with blood pressure measured in the office today of 116/70.  He is on carvedilol and lisinopril.  Hyperlipidemia History of hyperlipidemia on statin therapy with lipid profile performed 02/28/2017 revealed a total cholesterol of 144, LDL of  84 and HDL 29  Critical limb ischemia with history of revascularization of same extremity Critical limb ischemia Mr. Ray was referred by Dr. Robson for critical limb ischemia.  He has a gangrenous left fourth toe.  He seen Dr. Chen 2 years ago for cyanosis of his left first and fifth toe.  Conservative therapy was recommended at that time.  He apparently picked a blister off of his left fourth telemedicine Chelle 4 months ago and this never healed.  He did receive some IM antibiotics there over 3-week time..  [place] in [place] room in March for evaluation of this.  He has seen Dr. Robson at the wound care center multiple occasions since that time with Dopplers that show a right ABI 0.72 and a left of 0.64 performed in our office 02/13/2019.  He does complain of claudication left greater than right over the last year.  I suspect he has multilevel disease.  I am going to get lower extremity arterial Doppler studies in our office today and will discuss with Dr. Robson whether he is had any  progress with conservative aggressive local wound care.  If not, he will need angiography potential intervention for CLI/limb salvage.      Eunice Oldaker J. Khristian Phillippi MD FACP,FACC,FAHA, FSCAI 03/24/2019 10:22 AM 

## 2019-03-25 ENCOUNTER — Inpatient Hospital Stay (HOSPITAL_COMMUNITY): Admission: RE | Admit: 2019-03-25 | Payer: Self-pay | Source: Ambulatory Visit

## 2019-03-25 LAB — CBC
Hematocrit: 42.1 % (ref 37.5–51.0)
Hemoglobin: 14.3 g/dL (ref 13.0–17.7)
MCH: 31 pg (ref 26.6–33.0)
MCHC: 34 g/dL (ref 31.5–35.7)
MCV: 91 fL (ref 79–97)
Platelets: 183 10*3/uL (ref 150–450)
RBC: 4.61 x10E6/uL (ref 4.14–5.80)
RDW: 13 % (ref 11.6–15.4)
WBC: 9.4 10*3/uL (ref 3.4–10.8)

## 2019-03-25 LAB — BASIC METABOLIC PANEL
BUN/Creatinine Ratio: 15 (ref 9–20)
BUN: 15 mg/dL (ref 6–24)
CO2: 21 mmol/L (ref 20–29)
Calcium: 9.8 mg/dL (ref 8.7–10.2)
Chloride: 104 mmol/L (ref 96–106)
Creatinine, Ser: 1.03 mg/dL (ref 0.76–1.27)
GFR calc Af Amer: 98 mL/min/{1.73_m2} (ref >59)
GFR calc non Af Amer: 85 mL/min/{1.73_m2} (ref >59)
Glucose: 88 mg/dL (ref 65–99)
Potassium: 4.9 mmol/L (ref 3.5–5.2)
Sodium: 144 mmol/L (ref 134–144)

## 2019-03-26 ENCOUNTER — Other Ambulatory Visit: Payer: Self-pay

## 2019-03-26 ENCOUNTER — Other Ambulatory Visit (HOSPITAL_COMMUNITY)
Admission: RE | Admit: 2019-03-26 | Discharge: 2019-03-26 | Disposition: A | Discharge status: Home / Self Care | Payer: HRSA Program | Source: Ambulatory Visit | Attending: Cardiovascular Disease | Admitting: Cardiovascular Disease

## 2019-03-26 ENCOUNTER — Other Ambulatory Visit: Payer: Self-pay | Admitting: Cardiovascular Disease

## 2019-03-26 DIAGNOSIS — Z1159 Encounter for screening for other viral diseases: Secondary | ICD-10-CM | POA: Diagnosis present

## 2019-03-26 DIAGNOSIS — I779 Disorder of arteries and arterioles, unspecified: Secondary | ICD-10-CM

## 2019-03-26 NOTE — Telephone Encounter (Signed)
Follow up   Patient is following up on rx.

## 2019-03-26 NOTE — Telephone Encounter (Signed)
°*  STAT* If patient is at the pharmacy, call can be transferred to refill team.   1. Which medications need to be refilled? (please list name of each medication and dose if known)   atorvastatin (LIPITOR) 80 MG tablet    carvedilol (COREG) 3.125 MG tablet   glipiZIDE (GLUCOTROL XL) 2.5 MG 24 hr tablet isosorbide mononitrate (IMDUR) 30 MG 24 hr tablet lisinopril (PRINIVIL,ZESTRIL) 10 MG tablet   2. Which pharmacy/location (including street and city if local pharmacy) is medication to be sent to? [facility] [place], [place] - [address]  3. Do they need a 30 day or 90 day supply? 90 days

## 2019-03-27 ENCOUNTER — Other Ambulatory Visit: Payer: Self-pay

## 2019-03-27 ENCOUNTER — Telehealth: Payer: Self-pay

## 2019-03-27 DIAGNOSIS — I252 Old myocardial infarction: Secondary | ICD-10-CM

## 2019-03-27 DIAGNOSIS — I10 Essential (primary) hypertension: Secondary | ICD-10-CM

## 2019-03-27 DIAGNOSIS — E782 Mixed hyperlipidemia: Secondary | ICD-10-CM

## 2019-03-27 DIAGNOSIS — R001 Bradycardia, unspecified: Secondary | ICD-10-CM

## 2019-03-27 DIAGNOSIS — E1169 Type 2 diabetes mellitus with other specified complication: Secondary | ICD-10-CM

## 2019-03-27 LAB — NOVEL CORONAVIRUS, NAA (HOSP ORDER, SEND-OUT TO REF LAB; TAT 18-24 HRS): SARS-CoV-2, NAA: NOT DETECTED

## 2019-03-27 MED ORDER — CARVEDILOL 3.125 MG PO TABS: 3.1250 mg | ORAL_TABLET | Freq: Two times a day (BID) | ORAL | 3 refills | Status: DC

## 2019-03-27 MED ORDER — LISINOPRIL 10 MG PO TABS: 10.0000 mg | ORAL_TABLET | Freq: Every day | ORAL | 3 refills | Status: DC

## 2019-03-27 MED ORDER — ISOSORBIDE MONONITRATE ER 30 MG PO TB24: 30.0000 mg | ORAL_TABLET | Freq: Every day | ORAL | 3 refills | Status: DC

## 2019-03-27 MED ORDER — ATORVASTATIN CALCIUM 80 MG PO TABS: 80.0000 mg | ORAL_TABLET | Freq: Every day | ORAL | 3 refills | Status: DC

## 2019-03-27 NOTE — Telephone Encounter (Signed)
Patient's wife walked in office requesting 90 day refills on all medications.Refills sent to pharmacy.

## 2019-03-30 ENCOUNTER — Ambulatory Visit (HOSPITAL_COMMUNITY)
Admission: RE | Admit: 2019-03-30 | Discharge: 2019-03-31 | Disposition: A | Discharge status: Home / Self Care | Payer: Self-pay | Attending: Cardiovascular Disease | Admitting: Cardiovascular Disease

## 2019-03-30 ENCOUNTER — Encounter (HOSPITAL_COMMUNITY)
Admission: RE | Disposition: A | Discharge status: Home / Self Care | Payer: Self-pay | Source: Home / Self Care | Attending: Cardiovascular Disease

## 2019-03-30 ENCOUNTER — Other Ambulatory Visit: Payer: Self-pay

## 2019-03-30 DIAGNOSIS — Z7984 Long term (current) use of oral hypoglycemic drugs: Secondary | ICD-10-CM | POA: Insufficient documentation

## 2019-03-30 DIAGNOSIS — I110 Hypertensive heart disease with heart failure: Secondary | ICD-10-CM | POA: Insufficient documentation

## 2019-03-30 DIAGNOSIS — E785 Hyperlipidemia, unspecified: Secondary | ICD-10-CM | POA: Insufficient documentation

## 2019-03-30 DIAGNOSIS — I70262 Atherosclerosis of native arteries of extremities with gangrene, left leg: Secondary | ICD-10-CM | POA: Insufficient documentation

## 2019-03-30 DIAGNOSIS — Z79899 Other long term (current) drug therapy: Secondary | ICD-10-CM | POA: Insufficient documentation

## 2019-03-30 DIAGNOSIS — E119 Type 2 diabetes mellitus without complications: Secondary | ICD-10-CM

## 2019-03-30 DIAGNOSIS — E1152 Type 2 diabetes mellitus with diabetic peripheral angiopathy with gangrene: Secondary | ICD-10-CM | POA: Insufficient documentation

## 2019-03-30 DIAGNOSIS — I998 Other disorder of circulatory system: Secondary | ICD-10-CM | POA: Insufficient documentation

## 2019-03-30 DIAGNOSIS — Z955 Presence of coronary angioplasty implant and graft: Secondary | ICD-10-CM | POA: Insufficient documentation

## 2019-03-30 DIAGNOSIS — Z7982 Long term (current) use of aspirin: Secondary | ICD-10-CM | POA: Insufficient documentation

## 2019-03-30 DIAGNOSIS — I70229 Atherosclerosis of native arteries of extremities with rest pain, unspecified extremity: Secondary | ICD-10-CM | POA: Diagnosis present

## 2019-03-30 DIAGNOSIS — I255 Ischemic cardiomyopathy: Secondary | ICD-10-CM | POA: Insufficient documentation

## 2019-03-30 DIAGNOSIS — Z87891 Personal history of nicotine dependence: Secondary | ICD-10-CM | POA: Insufficient documentation

## 2019-03-30 DIAGNOSIS — I501 Left ventricular failure: Secondary | ICD-10-CM | POA: Insufficient documentation

## 2019-03-30 DIAGNOSIS — Z951 Presence of aortocoronary bypass graft: Secondary | ICD-10-CM | POA: Insufficient documentation

## 2019-03-30 DIAGNOSIS — R001 Bradycardia, unspecified: Secondary | ICD-10-CM | POA: Insufficient documentation

## 2019-03-30 DIAGNOSIS — I779 Disorder of arteries and arterioles, unspecified: Secondary | ICD-10-CM | POA: Diagnosis present

## 2019-03-30 DIAGNOSIS — I70201 Unspecified atherosclerosis of native arteries of extremities, right leg: Secondary | ICD-10-CM

## 2019-03-30 DIAGNOSIS — I2510 Atherosclerotic heart disease of native coronary artery without angina pectoris: Secondary | ICD-10-CM | POA: Insufficient documentation

## 2019-03-30 DIAGNOSIS — I252 Old myocardial infarction: Secondary | ICD-10-CM | POA: Insufficient documentation

## 2019-03-30 DIAGNOSIS — I10 Essential (primary) hypertension: Secondary | ICD-10-CM | POA: Diagnosis present

## 2019-03-30 HISTORY — PX: LOWER EXTREMITY ANGIOGRAPHY: CATH118251

## 2019-03-30 LAB — POCT ACTIVATED CLOTTING TIME
Activated Clotting Time: 263 seconds
Activated Clotting Time: 290 seconds
Activated Clotting Time: 175 seconds
Activated Clotting Time: 197 seconds

## 2019-03-30 LAB — GLUCOSE, CAPILLARY
Glucose-Capillary: 129 mg/dL — ABNORMAL HIGH (ref 70–99)
Glucose-Capillary: 110 mg/dL — ABNORMAL HIGH (ref 70–99)
Glucose-Capillary: 78 mg/dL (ref 70–99)
Glucose-Capillary: 98 mg/dL (ref 70–99)

## 2019-03-30 SURGERY — LOWER EXTREMITY ANGIOGRAPHY
Anesthesia: LOCAL

## 2019-03-30 MED ORDER — LIDOCAINE HCL (PF) 1 % IJ SOLN
INTRAMUSCULAR | Status: DC | PRN
  Administered 2019-03-30: 30 mL

## 2019-03-30 MED ORDER — HEPARIN (PORCINE) IN NACL 1000-0.9 UT/500ML-% IV SOLN
INTRAVENOUS | Status: AC
  Filled 2019-03-30: qty 1000

## 2019-03-30 MED ORDER — SODIUM CHLORIDE 0.9 % WEIGHT BASED INFUSION: 1.0000 mL/kg/h | INTRAVENOUS | Status: DC

## 2019-03-30 MED ORDER — HEPARIN (PORCINE) IN NACL 1000-0.9 UT/500ML-% IV SOLN
INTRAVENOUS | Status: DC | PRN
  Administered 2019-03-30: 500 mL

## 2019-03-30 MED ORDER — ASPIRIN 81 MG PO CHEW
81.0000 mg | CHEWABLE_TABLET | ORAL | Status: AC
  Administered 2019-03-30: 09:00:00 81 mg via ORAL
  Filled 2019-03-30: qty 1

## 2019-03-30 MED ORDER — SODIUM CHLORIDE 0.9% FLUSH: 3.0000 mL | Freq: Two times a day (BID) | INTRAVENOUS | Status: DC

## 2019-03-30 MED ORDER — HEPARIN SODIUM (PORCINE) 1000 UNIT/ML IJ SOLN
INTRAMUSCULAR | Status: AC
  Filled 2019-03-30: qty 1

## 2019-03-30 MED ORDER — CLOPIDOGREL BISULFATE 300 MG PO TABS
ORAL_TABLET | ORAL | Status: DC | PRN
  Administered 2019-03-30: 300 mg via ORAL

## 2019-03-30 MED ORDER — LIDOCAINE HCL (PF) 1 % IJ SOLN
INTRAMUSCULAR | Status: AC
  Filled 2019-03-30: qty 30

## 2019-03-30 MED ORDER — ASPIRIN EC 81 MG PO TBEC
81.0000 mg | DELAYED_RELEASE_TABLET | Freq: Every day | ORAL | Status: DC
  Administered 2019-03-31: 10:00:00 81 mg via ORAL
  Filled 2019-03-30 (×2): qty 1

## 2019-03-30 MED ORDER — ONDANSETRON HCL 4 MG/2ML IJ SOLN: 4.0000 mg | Freq: Four times a day (QID) | INTRAMUSCULAR | Status: DC | PRN

## 2019-03-30 MED ORDER — SODIUM CHLORIDE 0.9 % IV SOLN: 250.0000 mL | INTRAVENOUS | Status: DC | PRN

## 2019-03-30 MED ORDER — ATORVASTATIN CALCIUM 80 MG PO TABS
80.0000 mg | ORAL_TABLET | Freq: Every day | ORAL | Status: DC
  Administered 2019-03-30: 18:00:00 80 mg via ORAL
  Filled 2019-03-30: qty 1

## 2019-03-30 MED ORDER — CARVEDILOL 3.125 MG PO TABS
3.1250 mg | ORAL_TABLET | Freq: Two times a day (BID) | ORAL | Status: DC
  Administered 2019-03-30 – 2019-03-31 (×2): 3.125 mg via ORAL
  Filled 2019-03-30 (×2): qty 1

## 2019-03-30 MED ORDER — IODIXANOL 320 MG/ML IV SOLN
INTRAVENOUS | Status: DC | PRN
  Administered 2019-03-30: 220 mL via INTRAVENOUS

## 2019-03-30 MED ORDER — IBUPROFEN 200 MG PO TABS: 200.0000 mg | ORAL_TABLET | Freq: Two times a day (BID) | ORAL | Status: DC

## 2019-03-30 MED ORDER — FENTANYL CITRATE (PF) 100 MCG/2ML IJ SOLN
INTRAMUSCULAR | Status: DC | PRN
  Administered 2019-03-30 (×2): 25 ug via INTRAVENOUS

## 2019-03-30 MED ORDER — FENTANYL CITRATE (PF) 100 MCG/2ML IJ SOLN
INTRAMUSCULAR | Status: AC
  Filled 2019-03-30: qty 2

## 2019-03-30 MED ORDER — GLIPIZIDE ER 2.5 MG PO TB24
2.5000 mg | ORAL_TABLET | Freq: Every day | ORAL | Status: DC
  Administered 2019-03-31: 10:00:00 2.5 mg via ORAL
  Filled 2019-03-30: qty 1

## 2019-03-30 MED ORDER — SODIUM CHLORIDE 0.9% FLUSH: 3.0000 mL | INTRAVENOUS | Status: DC | PRN

## 2019-03-30 MED ORDER — MIDAZOLAM HCL 2 MG/2ML IJ SOLN
INTRAMUSCULAR | Status: DC | PRN
  Administered 2019-03-30: 1 mg via INTRAVENOUS

## 2019-03-30 MED ORDER — HYDRALAZINE HCL 20 MG/ML IJ SOLN: 5.0000 mg | INTRAMUSCULAR | Status: DC | PRN

## 2019-03-30 MED ORDER — ACETAMINOPHEN 325 MG PO TABS: 650.0000 mg | ORAL_TABLET | ORAL | Status: DC | PRN

## 2019-03-30 MED ORDER — ASPIRIN EC 81 MG PO TBEC: 81.0000 mg | DELAYED_RELEASE_TABLET | Freq: Every day | ORAL | Status: DC

## 2019-03-30 MED ORDER — CLOPIDOGREL BISULFATE 75 MG PO TABS
75.0000 mg | ORAL_TABLET | Freq: Every day | ORAL | Status: DC
  Administered 2019-03-31: 10:00:00 75 mg via ORAL
  Filled 2019-03-30: qty 1

## 2019-03-30 MED ORDER — CLOPIDOGREL BISULFATE 300 MG PO TABS
ORAL_TABLET | ORAL | Status: AC
  Filled 2019-03-30: qty 1

## 2019-03-30 MED ORDER — SODIUM CHLORIDE 0.9 % IV SOLN: INTRAVENOUS | Status: AC

## 2019-03-30 MED ORDER — ISOSORBIDE MONONITRATE ER 30 MG PO TB24
30.0000 mg | ORAL_TABLET | Freq: Every day | ORAL | Status: DC
  Administered 2019-03-30 – 2019-03-31 (×2): 30 mg via ORAL
  Filled 2019-03-30 (×2): qty 1

## 2019-03-30 MED ORDER — ACETAMINOPHEN 500 MG PO TABS
500.0000 mg | ORAL_TABLET | Freq: Two times a day (BID) | ORAL | Status: DC
  Administered 2019-03-30 – 2019-03-31 (×2): 500 mg via ORAL
  Filled 2019-03-30 (×2): qty 1

## 2019-03-30 MED ORDER — NITROGLYCERIN 1 MG/10 ML FOR IR/CATH LAB
INTRA_ARTERIAL | Status: DC | PRN
  Administered 2019-03-30: 200 ug via INTRA_ARTERIAL

## 2019-03-30 MED ORDER — ATORVASTATIN CALCIUM 80 MG PO TABS: 80.0000 mg | ORAL_TABLET | Freq: Every day | ORAL | Status: DC

## 2019-03-30 MED ORDER — MIDAZOLAM HCL 2 MG/2ML IJ SOLN
INTRAMUSCULAR | Status: AC
  Filled 2019-03-30: qty 2

## 2019-03-30 MED ORDER — HEPARIN SODIUM (PORCINE) 1000 UNIT/ML IJ SOLN
INTRAMUSCULAR | Status: DC | PRN
  Administered 2019-03-30: 8000 [IU] via INTRAVENOUS
  Administered 2019-03-30: 2500 [IU] via INTRAVENOUS

## 2019-03-30 MED ORDER — MORPHINE SULFATE (PF) 10 MG/ML IV SOLN: 2.0000 mg | INTRAVENOUS | Status: DC | PRN

## 2019-03-30 MED ORDER — LABETALOL HCL 5 MG/ML IV SOLN: 10.0000 mg | INTRAVENOUS | Status: DC | PRN

## 2019-03-30 MED ORDER — SODIUM CHLORIDE 0.9 % WEIGHT BASED INFUSION
3.0000 mL/kg/h | INTRAVENOUS | Status: DC
  Administered 2019-03-30: 3 mL/kg/h via INTRAVENOUS

## 2019-03-30 MED ORDER — LISINOPRIL 10 MG PO TABS
10.0000 mg | ORAL_TABLET | Freq: Every day | ORAL | Status: DC
  Administered 2019-03-30 – 2019-03-31 (×2): 10 mg via ORAL
  Filled 2019-03-30 (×2): qty 1

## 2019-03-30 MED ORDER — NITROGLYCERIN 1 MG/10 ML FOR IR/CATH LAB
INTRA_ARTERIAL | Status: AC
  Filled 2019-03-30: qty 10

## 2019-03-30 SURGICAL SUPPLY — 21 items
BALLN IN.PACT DCB 6X40 (BALLOONS) ×2
CATH ANGIO 5F PIGTAIL 65CM (CATHETERS) ×2 IMPLANT
CATH CROSS OVER TEMPO 5F (CATHETERS) ×2 IMPLANT
CATH HAWKONE LX EXTENDED TIP (CATHETERS) ×2 IMPLANT
CATH STRAIGHT 5FR 65CM (CATHETERS) ×2 IMPLANT
DCB IN.PACT 6X40 (BALLOONS) ×1 IMPLANT
DEVICE CONTINUOUS FLUSH (MISCELLANEOUS) ×2 IMPLANT
DEVICE SPIDERFX EMB PROT 6MM (WIRE) ×2 IMPLANT
KIT PV (KITS) ×2 IMPLANT
SHEATH HIGHFLEX ANSEL 7FR 55CM (SHEATH) ×2 IMPLANT
SHEATH PINNACLE 5F 10CM (SHEATH) ×2 IMPLANT
SHEATH PINNACLE 7F 10CM (SHEATH) ×2 IMPLANT
STOPCOCK MORSE 400PSI 3WAY (MISCELLANEOUS) ×2 IMPLANT
SYR MEDRAD MARK 7 150ML (SYRINGE) ×2 IMPLANT
TAPE VIPERTRACK RADIOPAQ (MISCELLANEOUS) ×1 IMPLANT
TAPE VIPERTRACK RADIOPAQUE (MISCELLANEOUS) ×1
TRANSDUCER W/STOPCOCK (MISCELLANEOUS) ×2 IMPLANT
TRAY PV CATH (CUSTOM PROCEDURE TRAY) ×2 IMPLANT
TUBING CIL FLEX 10 FLL-RA (TUBING) ×2 IMPLANT
WIRE HITORQ VERSACORE ST 145CM (WIRE) ×2 IMPLANT
WIRE SPARTACORE .014X300CM (WIRE) ×2 IMPLANT

## 2019-03-30 NOTE — Interval H&P Note (Signed)
History and Physical Interval Note:  03/30/2019 10:06 AM  Isaac Ray  has presented today for surgery, with the diagnosis of PAD.  The various methods of treatment have been discussed with the patient and family. After consideration of risks, benefits and other options for treatment, the patient has consented to  Procedure(s): LOWER EXTREMITY ANGIOGRAPHY (N/A) as a surgical intervention.  The patient's history has been reviewed, patient examined, no change in status, stable for surgery.  I have reviewed the patient's chart and labs.  Questions were answered to the patient's satisfaction.     Nanetta Batty

## 2019-03-30 NOTE — Discharge Instructions (Signed)
No tome metformin durante 2 dias completos despues de su procedimiento. (Hold Metformin for 2 full days after your procedure.)   Cuidado del sitio femoral Femoral Site Care Esta hoja le brinda informacin sobre cmo cuidarse despus del procedimiento. El mdico tambin podr darle indicaciones ms especficas. Comunquese con su mdico si tiene problemas o preguntas. Qu puedo esperar despus del procedimiento? Despus del procedimiento, es comn [employer] siguientes sntomas:  Hematomas que suelen desaparecer en el trmino de 1 a 2semanas.  Sensibilidad al tacto en Immunologist. Siga estas indicaciones en su casa: Cuidado de la herida  Siga las indicaciones del mdico acerca del cuidado del lugar de la insercin. Asegrese de hacer lo siguiente: ? Lvese las manos con agua y jabn antes de Multimedia programmer las vendas (vendajes). Use desinfectante para manos si no dispone de [place] y [place]. ? Cambie los vendajes como se lo haya indicado el mdico. ? No retire los puntos (suturas), la goma para cerrar la piel o las tiras [place]. Es posible que estos cierres cutneos Conservation officer, nature en la piel durante 2semanas o ms. Si los bordes de las tiras [address] empiezan a despegarse y Scientific laboratory technician, puede recortar los que estn sueltos. No retire las tiras [employer] por completo a menos que el mdico se lo indique.  No tome baos de inmersin, no nade ni use el jacuzzi hasta que el mdico lo autorice.  Puede ducharse entre 24y 48horas despus del procedimiento o como se lo haya indicado el mdico. ? Lave el lugar delicadamente con agua y jabn comn. ? Seque bien el rea con una toalla limpia dando golpecitos. ? No se frote en el lugar. Puede ocasionarle una hemorragia.  No se aplique talcos ni lociones en el lugar. Mantenga Immunologist limpio y Dealer.  Haematologist femoral todos los das para detectar signos de infeccin. Est atento a los siguientes signos: ? Dolor, hinchazn o  enrojecimiento. ? Lquido o sangre. ? Calor. ? Pus o mal olor. [employer] primeros 2 o 3 das despus del procedimiento, o como se lo haya indicado el mdico: ? Evite subir escaleras siempre que pueda. ? No se ponga en cuclillas.  No levante ningn objeto que pese ms de 10libras (4,5kg) o el lmite de peso que le hayan indicado, hasta que el mdico le diga que puede [place].  Haga reposo como se le indic. ? Evite estar sentado durante largos perodos sin moverse. Levntese y camine un poco cada 1 a 2 horas.  No conduzca durante 24horas si le dieron un medicamento para ayudarlo a que se relaje (sedante). Indicaciones generales  [employer] de venta libre y los recetados solamente como se lo haya indicado el mdico.  Oceanographer a todas las visitas de seguimiento como se lo haya indicado el mdico. Esto es importante. Comunquese con un mdico si tiene:  Fiebre o escalofros.  Tiene enrojecimiento, hinchazn o dolor alrededor del lugar de la insercin. Solicite ayuda de inmediato si:  La zona de insercin del catter se hincha muy rpido.  Se desmaya.  Comienza a sudar o la piel se humedece de manera repentina.  La zona de insercin del catter sangra, y la hemorragia no se detiene cuando ejerce presin constante en la zona.  La zona que est cerca o a poca distancia del lugar de insercin del catter se pone plida o fra, o siente hormigueo o adormecimiento. Estos sntomas pueden representar un problema grave que constituye Radio broadcast assistant. No espere hasta que los sntomas desaparezcan.  Solicite atencin mdica de inmediato. Comunquese con el servicio de emergencias de su localidad (911 en los [place]). No conduzca por sus propios medios [facility]. Resumen  Despus del procedimiento, es frecuente tener hematomas que suelen desaparecer en el trmino de 1 a 2 semanas.  Controle Immunologist femoral todos los das para detectar signos de  infeccin.  No levante ningn objeto que pese ms de 10libras (4,5kg) o el lmite de peso que le hayan indicado, hasta que el mdico le diga que puede [place]. Esta informacin no tiene Theme park manager el consejo del mdico. Asegrese de hacerle al mdico cualquier pregunta que tenga. Document Released: 07/30/2014 Document Revised: 12/05/2017 Document Reviewed: 12/05/2017 Elsevier Interactive Patient Education  2019 [employer].

## 2019-03-30 NOTE — Progress Notes (Signed)
Site area: Right groin a 7 french arterial sheath was removed  Site Prior to Removal:  Level 0  Pressure Applied For 20 MINUTES    Bedrest Beginning at 1445pm  Manual:   Yes.    Patient Status During Pull:  stable   Post Pull Groin Site:  Level 0  Post Pull Instructions Given:  Yes.    Post Pull Pulses Present:  Yes.    Dressing Applied:  Yes.    Comments:  VS remain stable

## 2019-03-30 NOTE — Telephone Encounter (Signed)
Rx has been sent to the pharmacy electronically. ° °

## 2019-03-31 ENCOUNTER — Encounter (HOSPITAL_COMMUNITY): Payer: Self-pay | Admitting: Cardiovascular Disease

## 2019-03-31 DIAGNOSIS — I998 Other disorder of circulatory system: Secondary | ICD-10-CM

## 2019-03-31 LAB — BASIC METABOLIC PANEL
Anion gap: 11 (ref 5–15)
BUN: 14 mg/dL (ref 6–20)
CO2: 22 mmol/L (ref 22–32)
Calcium: 8.9 mg/dL (ref 8.9–10.3)
Chloride: 108 mmol/L (ref 98–111)
Creatinine, Ser: 0.86 mg/dL (ref 0.61–1.24)
GFR calc Af Amer: >60 mL/min (ref >60)
GFR calc non Af Amer: >60 mL/min (ref >60)
Glucose, Bld: 96 mg/dL (ref 70–99)
Potassium: 3.6 mmol/L (ref 3.5–5.1)
Sodium: 141 mmol/L (ref 135–145)

## 2019-03-31 LAB — CBC
HCT: 35.7 % — ABNORMAL LOW (ref 39.0–52.0)
Hemoglobin: 12 g/dL — ABNORMAL LOW (ref 13.0–17.0)
MCH: 31.2 pg (ref 26.0–34.0)
MCHC: 33.6 g/dL (ref 30.0–36.0)
MCV: 92.7 fL (ref 80.0–100.0)
Platelets: 166 10*3/uL (ref 150–400)
RBC: 3.85 MIL/uL — ABNORMAL LOW (ref 4.22–5.81)
RDW: 12.9 % (ref 11.5–15.5)
WBC: 6.9 10*3/uL (ref 4.0–10.5)
nRBC: 0 % (ref 0.0–0.2)

## 2019-03-31 MED ORDER — CLOPIDOGREL BISULFATE 75 MG PO TABS: 75.0000 mg | ORAL_TABLET | Freq: Every day | ORAL | 2 refills | Status: DC

## 2019-03-31 NOTE — Discharge Summary (Signed)
Discharge Summary    Patient ID: Isaac Ray MRN: [id number]; DOB: Jan 04, 1971  Admit date: 03/30/2019 Discharge date: 03/31/2019  Primary Care Provider: Lizbeth Bark, FNP  Primary Cardiologist: Nanetta Batty, MD   Discharge Diagnoses    Principal Problem:   Critical lower limb ischemia Active Problems:   S/P CABG x 5   Essential hypertension   Hyperlipidemia   Diabetes (HCC)   PAOD (peripheral arterial occlusive disease) (HCC)   Ischemic cardiomyopathy   Critical limb ischemia with history of revascularization of same extremity  Allergies No Known Allergies  Diagnostic Studies/Procedures    Lower extremity angiography 03/30/2019:  IMPRESSION:Isaac Ray has a high-grade distal left common femoral artery stenosis with one-vessel runoff via the anterior tibial, critical limb ischemia with a gangrenous left fourth toe. We will proceed with directional atherectomy followed by drug-coated balloon angioplasty of the high-grade subtotally occluded left common femoral artery. This was reviewed with Dr. Randie Heinz as well.  Final Impression:Successful directional atherectomy followed by drug-coated balloon angioplasty with spider distal protection of a high-grade focal distal left common femoral artery stenosis with one-vessel runoff in the setting of critical limb ischemia. There is mild residual plaque but flow is clearly significantly improved. The patient was given 300 mg of p.o. Plavix. The sheath will be removed and pressure held once the ACT falls below 170. The patient will be hydrated overnight, discharged home in the morning. He will be treated with aspirin and Plavix. We will get lower extremity arterial Doppler studies in our [facility] line office next week and I will see him back 2 to 3 weeks thereafter. I have relayed this information to his referring physician.  History of Present Illness     48 y.o. male a prior history of CAD s/p CABG x5, PAD,  hypertension, DM 2, hyperlipidemia and tobacco abuse who was admitted 03/30/2023 lower extremity angiography per Dr. Allyson Sabal.   Seen by Dr. Allyson Sabal in the office 04/20/2015 after he was admitted with acute coronary syndrome and had minimally elevated enzymes with nonspecific ST and T-wave changes. Coronary arteriography performed via the right radial approach revealing three-vessel disease with severe LV dysfunction. 2 days later he underwent coronary artery bypass grafting by Dr. Dorris Fetch with 5 grafts placed including a LIMA to the LAD, sequential vein to OM1 and 2 and sequential left radial to diagonal branch 1 and 2. He did apparently stop smoking. A month after discharge he developed nocturnal chest pressure similar to his preintervention symptoms.  He had a normal echo 09/17/2018 and Myoview stress test as well.  He stopped smoking 2 years ago.  He returned from [place] several months ago where he was living since last seen by Dr. Allyson Sabal. He was seen in the emergency room for this an infected toe 01/21/2019.  He has been seeing Dr. Leanord Hawking at the wound care center.  He apparently had IM antibiotics in [place] for this prior to return to [place].  He had lower extremity arterial Doppler studies performed at [facility] 02/10/2019 which revealed a right ABI 0.72 and a left ABI 0.64 left fourth toe pressure was abnormal. He had complaints of claudication, left greater than right for the last year. He was then scheduled for LE angiography for further evaluation.   Hospital Course     Procedure performed 03/30/2019 in which a successful directional atherectomy followed by drug-coated balloon angioplasty of a high-grade focal distal left common femoral artery stenosis with one-vessel runoff in the setting of critical limb ischemia. There  was mild residual plaque but flow was clearly improved. He was given 300 mg of p.o. Plavix. Plan is to treat with aspirin and Plavix.   He has lower extremity arterial  Doppler studies scheduled for 04/08/2019 in our [facility]. Folow up with Dr. Allyson Sabal on 04/10/2019.   Other hospital problems include:  CAD with hx of CABG x5 2016: -History of NSTEMI in 2016 with cardiac catheterization revealing three-vessel disease with severe LV dysfunction -s/p CABG x5 per Dr. Dorris Fetch -Negative Myoview stress test 08/2018 with normalized LV function per echocardiogram at that time -Denies chest pain, shortness of breath -Continue ASA, atorvastatin 80, carvedilol 3.125 mg, Imdur 30, lisinopril 10  Critical limb ischemia with history of revascularization: -Referred to Dr. Allyson Sabal for critical limb ischemia  -Initially was followed by Dr. Leanord Hawking at the wound care center>>referred to Dr. Allyson Sabal in which Doppler studies were performed that showed a right ABI 0.72 and a left of 0.64.  He had complaints of claudication left greater than right over the last year and was seen by Dr. Allyson Sabal with plans for angiography -Angiography performed 03/30/2019 with directional arthrectomy followed by a drug-coated balloon angioplasty of the high-grade subtotally occluded left common femoral artery -Continue ASA and Plavix -Repeat lower extremity arterial Doppler studies at [facility] office next week, follow-up with Dr. Gery Pray 2 to 3 weeks thereafter -Creatinine stable in the postprocedure setting, 0.86 today  Ischemic cardiomyopathy: -Per echocardiogram, ischemic cardiomyopathy with initial LV function at 30% 05/05/2015, normalized per echocardiogram by 08/2018 -Continue lisinopril, carvedilol  Essential hypertension: -Stable, 112/62, 130/67 -Continue carvedilol, lisinopril  Hyperlipidemia: -Last LDL, 84 with LDL goal 70mg /dl  -Continue high-dose atorvastatin  Bradycardia: -Asymptomatic, HR in the 40 to 50 bpm range -Consider discontinuation of low-dose carvedilol -On no other AV blocking agents   Consultants: None   The patient was seen and examined by Dr. Clifton James who feels that  he is stable and ready for discharge today, 03/31/2019. Follow up as above. Groin site stable. Instructions reviewed.  _____________  Discharge Vitals Blood pressure (!) 117/54, pulse (!) 53, temperature 97.8 F (36.6 C), temperature source Oral, resp. rate 17, height 5\' 7"  (1.702 m), weight 74 kg, SpO2 95 %.  Filed Weights   03/30/19 0736 03/31/19 0527  Weight: 74.4 kg 74 kg   Labs & Radiologic Studies    CBC Recent Labs    03/31/19 0405  WBC 6.9  HGB 12.0*  HCT 35.7*  MCV 92.7  PLT 166   Basic Metabolic Panel Recent Labs    16/10/96 0405  NA 141  K 3.6  CL 108  CO2 22  GLUCOSE 96  BUN 14  CREATININE 0.86  CALCIUM 8.9  _____________  Vas [place] Abi With/wo Tbi  Result Date: 03/26/2019 LOWER EXTREMITY DOPPLER STUDY Indications: Peripheral artery disease, and Patient presents with              ulceration/gangrene on the left 4th toe for the past two months. He              denies any claudication symptoms. High Risk         Hypertension, hyperlipidemia, Diabetes, past history of Factors:          smoking. Other Factors: CABG x 5.  Comparison Study: In 02/13/19, an arterial Doppler showed a right ABI of 0.72 and                   left 0.64 Performing Technologist: Dondra Prader RVT  Examination Guidelines:  A complete evaluation includes at minimum, Doppler waveform signals and systolic blood pressure reading at the level of bilateral brachial, anterior tibial, and posterior tibial arteries, when vessel segments are accessible. Bilateral testing is considered an integral part of a complete examination. Photoelectric Plethysmograph (PPG) waveforms and toe systolic pressure readings are included as required and additional duplex testing as needed. Limited examinations for reoccurring indications may be performed as noted.  ABI Findings: +---------+------------------+-----+----------+--------+  Right     Rt Pressure (mmHg) Index Waveform   Comment    +---------+------------------+-----+----------+--------+  Brachial  128                                           +---------+------------------+-----+----------+--------+  ATA       116                0.90  monophasic           +---------+------------------+-----+----------+--------+  PTA       110                0.85  monophasic           +---------+------------------+-----+----------+--------+  PERO      115                0.89  monophasic           +---------+------------------+-----+----------+--------+  Great Toe 85                 0.66  Abnormal             +---------+------------------+-----+----------+--------+ +---------+------------------+-----+-------------------+-------+  Left      Lt Pressure (mmHg) Index Waveform            Comment  +---------+------------------+-----+-------------------+-------+  Brachial  129                                                   +---------+------------------+-----+-------------------+-------+  ATA       98                 0.76  dampened monophasic          +---------+------------------+-----+-------------------+-------+  PTA       97                 0.75  dampened monophasic          +---------+------------------+-----+-------------------+-------+  PERO      91                 0.71  dampened monophasic          +---------+------------------+-----+-------------------+-------+  Great Toe 55                 0.43  Abnormal                     +---------+------------------+-----+-------------------+-------+ +-------+-----------+-----------+------------+------------+  ABI/TBI Today's ABI Today's TBI Previous ABI Previous TBI  +-------+-----------+-----------+------------+------------+  Right   0.90        0.66        0.72         0.63          +-------+-----------+-----------+------------+------------+  Left    0.76        0.43  0.64         0.30          +-------+-----------+-----------+------------+------------+ Bilateral ABIs appear increased compared to prior study  on 01/2019.  Summary: Right: Resting right ankle-brachial index indicates mild right lower extremity arterial disease. The right toe-brachial index is abnormal. Left: Resting left ankle-brachial index indicates moderate left lower extremity arterial disease. The left toe-brachial index is abnormal.  *See table(s) above for measurements and observations.  Electronically signed by Lance Muss MD on 03/26/2019 at 8:09:12 AM.    Final    Vas [place] Lower Extremity Arterial Duplex  Result Date: 03/26/2019 LOWER EXTREMITY ARTERIAL DUPLEX STUDY Indications: Ulceration, and Patient presents with ulceration/gangrene on the              left 4th toe for the past two months. He denies any claudication              symptoms. High Risk Factors: Hypertension, hyperlipidemia, Diabetes, past history of                    smoking, coronary artery disease. Other Factors: S/p CABG x 5.  Current ABI: Today the right ABI was 0.90 and left 0.76 Comparison Study: In 01/2016, an arterial duplex showed a Known occlusion of the                   right peroneal artery. 50-74% stenosis in the right SFA with                   velocity of 270 cm/s. >50% stenosis in the right external                   iliac artery with a velocity of 592 cm/s.                   Known occlusion of the left posterior tibial and peroneal                   arteries, 75-99% stenosis in the left common femoral with a                   velocity of 535 cm/s and >50% stenosis in the external iliac                   artery with a velocity of 308 cm/s. Performing Technologist: Dondra Prader RVT  Examination Guidelines: A complete evaluation includes B-mode imaging, spectral Doppler, color Doppler, and power Doppler as needed of all accessible portions of each vessel. Bilateral testing is considered an integral part of a complete examination. Limited examinations for reoccurring indications may be performed as noted.   +-----------+--------+-----+---------------+----------+-------------+  RIGHT       PSV cm/s Ratio Stenosis        Waveform   Comments       +-----------+--------+-----+---------------+----------+-------------+  CIA Prox    96                                                       +-----------+--------+-----+---------------+----------+-------------+  CIA Mid     81                                                       +-----------+--------+-----+---------------+----------+-------------+  CIA Distal  72                                                       +-----------+--------+-----+---------------+----------+-------------+  EIA Prox    97                                                       +-----------+--------+-----+---------------+----------+-------------+  EIA Mid     165                                                      +-----------+--------+-----+---------------+----------+-------------+  EIA Distal  483                                       >50% stenosis  +-----------+--------+-----+---------------+----------+-------------+  CFA Prox    471            75-99% stenosis                           +-----------+--------+-----+---------------+----------+-------------+  CFA Distal  152                            biphasic                  +-----------+--------+-----+---------------+----------+-------------+  DFA         88                             biphasic                  +-----------+--------+-----+---------------+----------+-------------+  SFA Prox    80                             biphasic                  +-----------+--------+-----+---------------+----------+-------------+  SFA Mid     93                             biphasic                  +-----------+--------+-----+---------------+----------+-------------+  SFA Distal  84                             monophasic                +-----------+--------+-----+---------------+----------+-------------+  POP Prox    37                             monophasic                 +-----------+--------+-----+---------------+----------+-------------+  POP  Distal  48                             monophasic                +-----------+--------+-----+---------------+----------+-------------+  TP Trunk    55                             monophasic                +-----------+--------+-----+---------------+----------+-------------+  ATA Prox    57                             monophasic                +-----------+--------+-----+---------------+----------+-------------+  ATA Mid     16                             monophasic                +-----------+--------+-----+---------------+----------+-------------+  ATA Distal  77                             monophasic                +-----------+--------+-----+---------------+----------+-------------+  PTA Prox    16                             monophasic                +-----------+--------+-----+---------------+----------+-------------+  PTA Mid     24                             monophasic                +-----------+--------+-----+---------------+----------+-------------+  PTA Distal  17                             monophasic                +-----------+--------+-----+---------------+----------+-------------+  PERO Prox                  occluded                                  +-----------+--------+-----+---------------+----------+-------------+  PERO Mid                   occluded                                  +-----------+--------+-----+---------------+----------+-------------+  PERO Distal                occluded                                  +-----------+--------+-----+---------------+----------+-------------+  +-----------+--------+-----+--------------+----------+-------------------------+  LEFT        PSV cm/s Ratio Stenosis       Waveform   Comments                   +-----------+--------+-----+--------------+----------+-------------------------+  CIA Mid     71                                                                   +-----------+--------+-----+--------------+----------+-------------------------+  CIA Distal  150                                                                 +-----------+--------+-----+--------------+----------+-------------------------+  EIA Prox    285                                      >50% stenosis              +-----------+--------+-----+--------------+----------+-------------------------+  EIA Mid     144                                                                 +-----------+--------+-----+--------------+----------+-------------------------+  EIA Distal  93                                                                  +-----------+--------+-----+--------------+----------+-------------------------+  CFA Prox    130                           monophasic                            +-----------+--------+-----+--------------+----------+-------------------------+  CFA Distal  298            50-74%                    Per peak systolic                                      stenosis                  velocity                   +-----------+--------+-----+--------------+----------+-------------------------+  DFA         52                            monophasic                            +-----------+--------+-----+--------------+----------+-------------------------+  SFA Prox    97  monophasic                            +-----------+--------+-----+--------------+----------+-------------------------+  SFA Mid     77                            monophasic                            +-----------+--------+-----+--------------+----------+-------------------------+  SFA Distal  58                            monophasic                            +-----------+--------+-----+--------------+----------+-------------------------+  POP Prox    38                            monophasic                            +-----------+--------+-----+--------------+----------+-------------------------+  POP  Distal  33                            monophasic                            +-----------+--------+-----+--------------+----------+-------------------------+  TP Trunk    39                            monophasic                            +-----------+--------+-----+--------------+----------+-------------------------+  ATA Prox    42                            monophasic                            +-----------+--------+-----+--------------+----------+-------------------------+  ATA Mid     18                            monophasic                            +-----------+--------+-----+--------------+----------+-------------------------+  ATA Distal  33                            monophasic                            +-----------+--------+-----+--------------+----------+-------------------------+  PTA Prox    18                            monophasic                            +-----------+--------+-----+--------------+----------+-------------------------+  PTA Mid  14                            monophasic                            +-----------+--------+-----+--------------+----------+-------------------------+  PTA Distal                 occluded                                             +-----------+--------+-----+--------------+----------+-------------------------+  PERO Prox                  occluded                                             +-----------+--------+-----+--------------+----------+-------------------------+  PERO Mid                   occluded                                             +-----------+--------+-----+--------------+----------+-------------------------+  PERO Distal                occluded                                             +-----------+--------+-----+--------------+----------+-------------------------+ A focal velocity elevation of 298 cm/s was obtained at with post stenotic turbulence with a VR of 1.8. Findings are characteristic of 50-74% stenosis.  Aorta:  +------+----------+         PSV (cm/s)  +------+----------+  Distal 55          +------+----------+  Summary: Right: No significant change compared to previous study. Atherosclerosis in the iliac, common femoral, femoral, popliteal and tibial arteries. >50% stenosis in the distal external iliac artery. 75-99% stenosis in the proximal common femoral artery. Two vessel run off. Peroneal artery appears to be occluded. Left: No significant change as compared to previous study. Atherosclerosis in the iliac, common femoral, femoral, popliteal and tibial arteries. >50% stenosis in the internal iliac artery and proximal external iliac artery. 50-74% stenosis in the distal common femoral artery. Posterior tibial artery is occluded distally. Peroneal artery appears to be occluded. One vessel run off.  See table(s) above for measurements and observations. Electronically signed by Lance Muss MD on 03/26/2019 at 9:21:17 AM.    Final    Disposition   Pt is being discharged home today in good condition.  Follow-up Plans & Appointments   Follow-up Information    Runell Gess, MD Follow up on 04/10/2019.   Specialties:  Cardiology, Radiology Why:  Your follow up appoitment will be on 04/10/2019 with Dr. Allyson Sabal at 0930am.  [facility] information: [address] [place] [address] [phone]        [facility] Follow up on 04/08/2019.   Specialty:  Cardiology Why:  Your follow up vascular studies will be on 04/08/2019 at 3pm Contact  information: [address] [place] [address] [phone]         Discharge Instructions    Call MD for:  difficulty breathing, headache or visual disturbances   Complete by:  As directed    Call MD for:  extreme fatigue   Complete by:  As directed    Call MD for:  hives   Complete by:  As directed    Call MD for:  persistant dizziness or light-headedness   Complete by:  As directed    Call MD for:   persistant nausea and vomiting   Complete by:  As directed    Call MD for:  redness, tenderness, or signs of infection (pain, swelling, redness, odor or green/yellow discharge around incision site)   Complete by:  As directed    Call MD for:  severe uncontrolled pain   Complete by:  As directed    Call MD for:  temperature >100.4   Complete by:  As directed    Diet - low sodium heart healthy   Complete by:  As directed    Discharge instructions   Complete by:  As directed    PLEASE DO NOT MISS ANY DOSES OF YOUR PLAVIX!!!!! Also keep a log of you blood pressures and bring back to your follow up appt. Please call the office with any questions.   Patients taking blood thinners should generally stay away from medicines like ibuprofen, Advil, Motrin, naproxen, and Aleve due to risk of stomach bleeding. You may take Tylenol as directed or talk to your primary doctor about alternatives.  Some studies suggest Prilosec/Omeprazole interacts with Plavix. If you have reflux symptoms, please take Protonix for less chance of interaction.   No driving for 3 days. No lifting over 5 lbs for 1 week. No sexual activity for 1 week. Keep procedure site clean & dry. If you notice increased pain, swelling, bleeding or pus, call/return!  You may shower, but no soaking baths/hot tubs/pools for 1 week.   If you notice any bleeding such as blood in stool, black tarry stools, blood in urine, nosebleeds or any other unusual bleeding, call your doctor immediately. It is not normal to have this kind of bleeding while on a blood thinner and usually indicates there is an underlying problem with one of your body systems that needs to be checked out.   Increase activity slowly   Complete by:  As directed      Discharge Medications   Allergies as of 03/31/2019   No Known Allergies     Medication List    STOP taking these medications   ibuprofen 200 MG tablet Commonly known as:  ADVIL     TAKE these medications     acetaminophen 500 MG tablet Commonly known as:  TYLENOL Take 500 mg by mouth 2 (two) times a day.   aspirin EC 81 MG tablet Take 1 tablet (81 mg total) by mouth daily.   atorvastatin 80 MG tablet Commonly known as:  LIPITOR Take 1 tablet (80 mg total) by mouth daily.   carvedilol 3.125 MG tablet Commonly known as:  COREG Take 1 tablet (3.125 mg total) by mouth 2 (two) times daily with a meal.   clopidogrel 75 MG tablet Commonly known as:  PLAVIX Take 1 tablet (75 mg total) by mouth daily with breakfast.   glipiZIDE 2.5 MG 24 hr tablet Commonly known as:  GLUCOTROL XL Take 1 tablet (2.5 mg total) by mouth daily with breakfast.  isosorbide mononitrate 30 MG 24 hr tablet Commonly known as:  IMDUR Take 1 tablet (30 mg total) by mouth daily.   lisinopril 10 MG tablet Commonly known as:  ZESTRIL Take 1 tablet (10 mg total) by mouth daily.   metFORMIN 1000 MG tablet Commonly known as:  GLUCOPHAGE Take 1 tablet (1,000 mg total) by mouth 2 (two) times daily with a meal.       Acute coronary syndrome (MI, NSTEMI, STEMI, etc) this admission?: No.    Outstanding Labs/Studies   Follow up with Dr. Allyson Sabal and LE vascular studies as above   Duration of Discharge Encounter   Greater than 30 minutes including physician time.  Signed, Georgie Chard, NP 03/31/2019, 9:02 AM

## 2019-03-31 NOTE — Progress Notes (Addendum)
Progress Note  Patient Name: Isaac Ray Date of Encounter: 03/31/2019  Primary Cardiologist: Dr. Nanetta Batty  Subjective   Pt feeling great today. Mild pain in lower right leg. Groin site stable. Anticipate discharge today.   Inpatient Medications    Scheduled Meds: . acetaminophen  500 mg Oral BID  . aspirin EC  81 mg Oral Daily  . atorvastatin  80 mg Oral q1800  . carvedilol  3.125 mg Oral BID WC  . clopidogrel  75 mg Oral Q breakfast  . glipiZIDE  2.5 mg Oral Q breakfast  . isosorbide mononitrate  30 mg Oral Daily  . lisinopril  10 mg Oral Daily  . sodium chloride flush  3 mL Intravenous Q12H   Continuous Infusions: . sodium chloride     PRN Meds: sodium chloride, acetaminophen, hydrALAZINE, labetalol, morphine injection, ondansetron (ZOFRAN) IV, sodium chloride flush   Vital Signs    Vitals:   03/30/19 1807 03/30/19 1837 03/30/19 2216 03/31/19 0527  BP: (!) 142/76 130/67 112/62 (!) 117/54  Pulse:   (!) 54 (!) 53  Resp:   17 17  Temp:   98.2 F (36.8 C) 97.8 F (36.6 C)  TempSrc:   Oral Oral  SpO2:   97% 95%  Weight:    74 kg  Height:        Intake/Output Summary (Last 24 hours) at 03/31/2019 0932 Last data filed at 03/31/2019 0600 Gross per 24 hour  Intake 120 ml  Output 1250 ml  Net -1130 ml   Filed Weights   03/30/19 0736 03/31/19 0527  Weight: 74.4 kg 74 kg    Physical Exam   General: Well developed, well nourished, NAD Skin: Warm, dry, intact  Head: Normocephalic, atraumatic, clear, moist mucus membranes. Neck: Negative for carotid bruits. No JVD Lungs: Clear to ausculation bilaterally. No wheezes, rales, or rhonchi. Breathing is unlabored. Cardiovascular: RRR with S1 S2. Abdomen: Soft, non-tender, non-distended with normoactive bowel sounds. No hepatomegaly, No rebound/guarding. No obvious abdominal masses. MSK: Strength and tone appear normal for age. 5/5 in all extremities Extremities: No edema. No clubbing or cyanosis.  DP/PT pulses 2+ L/1+ R  Neuro: Alert and oriented. No focal deficits. No facial asymmetry. MAE spontaneously. Psych: Responds to questions appropriately with normal affect.    Labs    Chemistry Recent Labs  Lab 03/24/19 1423 03/31/19 0405  NA 144 141  K 4.9 3.6  CL 104 108  CO2 21 22  GLUCOSE 88 96  BUN 15 14  CREATININE 1.03 0.86  CALCIUM 9.8 8.9  GFRNONAA 85 >60  GFRAA 98 >60  ANIONGAP  --  11     Hematology Recent Labs  Lab 03/24/19 1423 03/31/19 0405  WBC 9.4 6.9  RBC 4.61 3.85*  HGB 14.3 12.0*  HCT 42.1 35.7*  MCV 91 92.7  MCH 31.0 31.2  MCHC 34.0 33.6  RDW 13.0 12.9  PLT 183 166    Cardiac EnzymesNo results for input(s): TROPONINI in the last 168 hours. No results for input(s): TROPIPOC in the last 168 hours.   BNPNo results for input(s): BNP, PROBNP in the last 168 hours.   DDimer No results for input(s): DDIMER in the last 168 hours.   Radiology    No results found.  Telemetry    SB, HR 64 - Personally Reviewed  ECG    03/24/2019 sinus bradycardia with interventricular block, TWI in lateral leads- Personally Reviewed  Cardiac Studies   Lower extremity angiography 03/30/2019:  IMPRESSION: Mr. Isaac Ray has a high-grade distal left common femoral artery stenosis with one-vessel runoff via the anterior tibial, critical limb ischemia with a gangrenous left fourth toe.  We will proceed with directional atherectomy followed by drug-coated balloon angioplasty of the high-grade subtotally occluded left common femoral artery.  This was reviewed with Dr. Randie Heinz as well.  Final Impression: Successful directional atherectomy followed by drug-coated balloon angioplasty with spider distal protection of a high-grade focal distal left common femoral artery stenosis with one-vessel runoff in the setting of critical limb ischemia.  There is mild residual plaque but flow is clearly significantly improved.  The patient was given 300 mg of p.o. Plavix.  The sheath will  be removed and pressure held once the ACT falls below 170.  The patient will be hydrated overnight, discharged home in the morning.  He will be treated with aspirin and Plavix.  We will get lower extremity arterial Doppler studies in our [facility] line office next week and I will see him back 2 to 3 weeks thereafter.  I have relayed this information to his referring physician.  Patient Profile     48 y.o. male a prior history of CAD s/p CABG x5, PAD, hypertension, DM 2, hyperlipidemia and tobacco abuse who was admitted 03/30/2023 lower extremity angiography per Dr. Allyson Sabal.   Assessment & Plan    1. CAD with hx of CABG x5 2016: -History of NSTEMI in 2016 with cardiac catheterization revealing three-vessel disease with severe LV dysfunction -s/p CABG x5 per Dr. Dorris Fetch -Negative Myoview stress test 08/2018 with normalized LV function per echocardiogram at that time -Denies chest pain, shortness of breath -Continue ASA, atorvastatin 80, carvedilol 3.125 mg, Imdur 30, lisinopril 10  2.  Critical limb ischemia with history of revascularization: -Referred to Dr. Allyson Sabal for critical limb ischemia  -Initially was followed by Dr. Leanord Hawking at the wound care center>>referred to Dr. Allyson Sabal in which Doppler studies were performed that showed a right ABI 0.72 and a left of 0.64.  He had complaints of claudication left greater than right over the last year and was seen by Dr. Allyson Sabal with plans for angiography -Angiography performed 03/30/2019 with directional arthrectomy followed by a drug-coated balloon angioplasty of the high-grade subtotally occluded left common femoral artery -Continue ASA and Plavix -Repeat lower extremity arterial Doppler studies at [facility] office next week, follow-up with Dr. Gery Pray 2 to 3 weeks thereafter -Creatinine stable in the postprocedure setting, 0.86 today  3.  Ischemic cardiomyopathy: -Per echocardiogram, ischemic cardiomyopathy with initial LV function at 30% 05/05/2015, normalized per  echocardiogram by 08/2018 -Continue lisinopril, carvedilol  4.  Essential hypertension: -Stable, seen over 54, 112/62, 130/67 -Continue carvedilol, lisinopril  5. Hyperlipidemia: -Last LDL, 84 with LDL goal 70mg /dl  -Continue high-dose atorvastatin  6.  Bradycardia: -Asymptomatic, HR in the 40 to 50 bpm range -Consider discontinuation of low-dose carvedilol -On no other AV blocking agents  Signed, Georgie Chard NP-C [facility] Pager: [phone] 03/31/2019, 7:22 AM    I have personally seen and examined this patient. I agree with the assessment and plan as outlined above.  He is s/p intervention of the left common femoral artery 03/30/19. He is on ASA and Plavix. Doing well this am. Will d/c home today.   Verne Carrow 03/31/2019 8:48 AM

## 2019-04-02 ENCOUNTER — Other Ambulatory Visit: Payer: Self-pay

## 2019-04-02 ENCOUNTER — Other Ambulatory Visit (HOSPITAL_COMMUNITY): Payer: Self-pay | Admitting: Cardiovascular Disease

## 2019-04-02 ENCOUNTER — Encounter: Payer: Self-pay | Admitting: Primary Care

## 2019-04-02 ENCOUNTER — Ambulatory Visit: Payer: Self-pay | Attending: Primary Care | Admitting: Primary Care

## 2019-04-02 VITALS — BP 124/77 | HR 61 | Temp 98.0°F | Ht 67.0 in | Wt 164.6 lb

## 2019-04-02 DIAGNOSIS — E119 Type 2 diabetes mellitus without complications: Secondary | ICD-10-CM

## 2019-04-02 DIAGNOSIS — I10 Essential (primary) hypertension: Secondary | ICD-10-CM

## 2019-04-02 DIAGNOSIS — I739 Peripheral vascular disease, unspecified: Secondary | ICD-10-CM

## 2019-04-02 DIAGNOSIS — I96 Gangrene, not elsewhere classified: Secondary | ICD-10-CM

## 2019-04-02 LAB — POCT GLYCOSYLATED HEMOGLOBIN (HGB A1C): Hemoglobin A1C: 6.5 % — AB (ref 4.0–5.6)

## 2019-04-02 MED ORDER — METFORMIN HCL 1000 MG PO TABS: 1000.0000 mg | ORAL_TABLET | Freq: Two times a day (BID) | ORAL | 3 refills | Status: DC

## 2019-04-02 NOTE — Progress Notes (Signed)
Established Patient Office Visit  Subjective:  Patient ID: Isaac Ray, male    DOB: 18-Mar-1971  Age: 48 y.o. MRN: [id number]  CC:  Chief Complaint  Patient presents with  . Diabetes  . Toe Pain    the forth toe on left foot     HPI Isaac Ray presents for management of diabetes. Past medical history of CADs/pCABG x5, PAD, hypertension, hyperlipidemia and tobacco abuse.   Past Medical History:  Diagnosis Date  . CAD (coronary artery disease)   . Essential hypertension   . Heart murmur    when I was a teenager- no mention of it now  . Hyperlipidemia   . Type 2 diabetes mellitus (HCC)    Type II    Past Surgical History:  Procedure Laterality Date  . AMPUTATION Left 04/08/2019   Procedure: LEFT FOOT 4TH TOE AMPUTATION;  Surgeon: Nadara Mustard, MD;  Location: [facility] OR;  Service: Orthopedics;  Laterality: Left;  . CORONARY ANGIOPLASTY WITH STENT PLACEMENT  2013  . CORONARY ARTERY BYPASS GRAFT N/A 02/16/2015   Procedure: CORONARY ARTERY BYPASS GRAFTING (CABG) x5 using left internal mammary artery, right thigh greater saphenous vein, and left radial artery. ;  Surgeon: [name], MD;  Location: [facility] OR;  Service: Open Heart Surgery;  Laterality: N/A;  . LEFT HEART CATHETERIZATION WITH CORONARY ANGIOGRAM N/A 02/14/2015   Procedure: LEFT HEART CATHETERIZATION WITH CORONARY ANGIOGRAM;  Surgeon: Runell Gess, MD;  Location: [facility] CATH LAB;  Service: Cardiovascular;  Laterality: N/A;  . LOWER EXTREMITY ANGIOGRAPHY N/A 03/30/2019   Procedure: LOWER EXTREMITY ANGIOGRAPHY;  Surgeon: Runell Gess, MD;  Location: [facility] INVASIVE CV LAB;  Service: Cardiovascular;  Laterality: N/A;  . RADIAL ARTERY HARVEST Left 02/16/2015   Procedure: RADIAL ARTERY HARVEST;  Surgeon: [name], MD;  Location: [facility] OR;  Service: Open Heart Surgery;  Laterality: Left;  . TEE WITHOUT CARDIOVERSION N/A 02/16/2015   Procedure: TRANSESOPHAGEAL ECHOCARDIOGRAM (TEE);  Surgeon:  [name], MD;  Location: [facility] OR;  Service: Open Heart Surgery;  Laterality: N/A;    Family History  Problem Relation Age of Onset  . Coronary artery disease Father        had CABG  . Heart disease Father     Social History   Socioeconomic History  . Marital status: Single    Spouse name: Not on file  . Number of children: Not on file  . Years of education: Not on file  . Highest education level: Not on file  Occupational History  . Not on file  Social Needs  . Financial resource strain: Not on file  . Food insecurity    Worry: Not on file    Inability: Not on file  . Transportation needs    Medical: Not on file    Non-medical: Not on file  Tobacco Use  . Smoking status: Former Smoker    Years: 30.00    Quit date: 02/16/2015    Years since quitting: 4.1  . Smokeless tobacco: Never Used  Substance and Sexual Activity  . Alcohol use: No    Alcohol/week: 0.0 standard drinks  . Drug use: No  . Sexual activity: Not on file  Lifestyle  . Physical activity    Days per week: Not on file    Minutes per session: Not on file  . Stress: Not on file  Relationships  . Social Musician on phone: Not on file    Gets  together: Not on file    Attends religious service: Not on file    Active member of club or organization: Not on file    Attends meetings of clubs or organizations: Not on file    Relationship status: Not on file  . Intimate partner violence    Fear of current or ex partner: Not on file    Emotionally abused: Not on file    Physically abused: Not on file    Forced sexual activity: Not on file  Other Topics Concern  . Not on file  Social History Narrative  . Not on file    Outpatient Medications Prior to Visit  Medication Sig Dispense Refill  . acetaminophen (TYLENOL) 500 MG tablet Take 500 mg by mouth 2 (two) times a day.    Marland Kitchen aspirin EC 81 MG tablet Take 1 tablet (81 mg total) by mouth daily. 90 tablet 3  . atorvastatin (LIPITOR) 80  MG tablet Take 1 tablet (80 mg total) by mouth daily. 90 tablet 3  . carvedilol (COREG) 3.125 MG tablet Take 1 tablet (3.125 mg total) by mouth 2 (two) times daily with a meal. 180 tablet 3  . clopidogrel (PLAVIX) 75 MG tablet Take 1 tablet (75 mg total) by mouth daily with breakfast. 90 tablet 2  . glipiZIDE (GLUCOTROL XL) 2.5 MG 24 hr tablet Take 1 tablet (2.5 mg total) by mouth daily with breakfast. 90 tablet 2  . isosorbide mononitrate (IMDUR) 30 MG 24 hr tablet Take 1 tablet (30 mg total) by mouth daily. 90 tablet 3  . lisinopril (ZESTRIL) 10 MG tablet Take 1 tablet (10 mg total) by mouth daily. 90 tablet 3  . metFORMIN (GLUCOPHAGE) 1000 MG tablet Take 1 tablet (1,000 mg total) by mouth 2 (two) times daily with a meal. (Patient not taking: Reported on 04/02/2019) 120 tablet 2   No facility-administered medications prior to visit.     No Known Allergies  ROS Review of Systems  Constitutional: Negative.   HENT: Negative.   Eyes: Positive for visual disturbance.  Respiratory: Negative.   Cardiovascular: Negative.   Gastrointestinal: Negative.   Endocrine: Positive for polydipsia and polyuria.  Genitourinary: Positive for frequency.  Musculoskeletal: Negative.   Skin: Negative.   Allergic/Immunologic: Negative.   Neurological: Negative.   Hematological: Negative.       Objective:    Physical Exam  Constitutional: He is oriented to person, place, and time. He appears well-developed and well-nourished.  HENT:  Head: Normocephalic.  Eyes: EOM are normal.  Cardiovascular: Normal rate and regular rhythm.  Pulmonary/Chest: Effort normal and breath sounds normal.  Abdominal: Bowel sounds are normal.  Musculoskeletal: Normal range of motion.  Neurological: He is oriented to person, place, and time.  Skin: Skin is warm.  Psychiatric: He has a normal mood and affect.    BP 124/77 (BP Location: Left Arm, Patient Position: Sitting, Cuff Size: Normal)   Pulse 61   Temp 98 F (36.7  C) (Oral)   Ht  (1.702 m)   Wt 164 lb 9.6 oz (74.7 kg)   SpO2 94%   BMI 25.78 kg/m  Wt Readings from Last 3 Encounters:  04/10/19 165 lb (74.8 kg)  04/08/19 164 lb (74.4 kg)  04/06/19 164 lb (74.4 kg)     Health Maintenance Due  Topic Date Due  . PNEUMOCOCCAL POLYSACCHARIDE VACCINE AGE 56-64 HIGH RISK  10/18/1971  . OPHTHALMOLOGY EXAM  10/18/1979  . TETANUS/TDAP  10/17/1988  . FOOT EXAM  02/28/2018  There are no preventive care reminders to display for this patient.  Lab Results  Component Value Date   TSH 1.956 02/12/2015   Lab Results  Component Value Date   WBC 6.9 03/31/2019   HGB 12.0 (L) 03/31/2019   HCT 35.7 (L) 03/31/2019   MCV 92.7 03/31/2019   PLT 166 03/31/2019   Lab Results  Component Value Date   NA 141 03/31/2019   K 3.6 03/31/2019   CO2 22 03/31/2019   GLUCOSE 96 03/31/2019   BUN 14 03/31/2019   CREATININE 0.86 03/31/2019   BILITOT 0.8 01/06/2017   ALKPHOS 74 01/06/2017   AST 27 01/06/2017   ALT 32 01/06/2017   PROT 7.8 01/06/2017   ALBUMIN 4.3 01/06/2017   CALCIUM 8.9 03/31/2019   ANIONGAP 11 03/31/2019   Lab Results  Component Value Date   CHOL 144 02/28/2017   Lab Results  Component Value Date   HDL 29 (L) 02/28/2017   Lab Results  Component Value Date   LDLCALC 84 02/28/2017   Lab Results  Component Value Date   TRIG 156 (H) 02/28/2017   Lab Results  Component Value Date   CHOLHDL 5.0 02/28/2017   Lab Results  Component Value Date   HGBA1C 6.5 (A) 04/02/2019      Assessment & Plan:   Problem List Items Addressed This Visit    Essential hypertension   Type 2 diabetes mellitus (HCC) - Primary   Relevant Medications   metFORMIN (GLUCOPHAGE) 1000 MG tablet   Other Relevant Orders   HgB A1c (Completed)      Meds ordered this encounter  Medications  . metFORMIN (GLUCOPHAGE) 1000 MG tablet    Sig: Take 1 tablet (1,000 mg total) by mouth 2 (two) times daily with a meal.    Dispense:  60 tablet    Refill:   3   Nicholaus was seen today for diabetes and toe pain.  Diagnoses and all orders for this visit:  Type 2 diabetes mellitus with complication, without long-term current use of insulin (HCC) -     Cancel: Glucose (CBG) -     HgB A1c 6.4 well controlled  -     metFORMIN (GLUCOPHAGE) 1000 MG tablet; Take 1 tablet (1,000 mg total) by mouth 2 (two) times daily with a meal.  Essential hypertension Unremarkable on ACE for renal protection   Infected toe Called Dr. Lajoyce Corners advised to send to office  evaluate   Follow-up: No follow-ups on file.    Grayce Sessions, NP

## 2019-04-02 NOTE — Patient Instructions (Signed)
Diabetes mellitus y nutricin, en adultos  Diabetes Mellitus and Nutrition, Adult  Si sufre de diabetes (diabetes mellitus), es muy importante tener hbitos alimenticios saludables debido a que sus niveles de azcar en la sangre (glucosa) se ven afectados en gran medida por lo que come y bebe. [place]er alimentos saludables en las cantidades adecuadas, aproximadamente a la misma hora todos los das, lo ayudar a:   Controlar la glucemia.   Disminuir el riesgo de sufrir una enfermedad cardaca.   Mejorar la presin arterial.   Alcanzar o mantener un peso saludable.  Todas las personas que sufren de diabetes son diferentes y cada una tiene necesidades diferentes en cuanto a un plan de alimentacin. El mdico puede recomendarle que trabaje con un especialista en dietas y nutricin (nutricionista) para elaborar el mejor plan para usted. Su plan de alimentacin puede variar segn factores como:   Las caloras que necesita.   Los medicamentos que toma.   Su peso.   Sus niveles de glucemia, presin arterial y colesterol.   Su nivel de actividad.   Otras afecciones que tenga, como enfermedades cardacas o renales.  Cmo me afectan los carbohidratos?  Los carbohidratos, o hidratos de carbono, afectan su nivel de glucemia ms que cualquier otro tipo de alimento. La ingesta de carbohidratos naturalmente aumenta la cantidad de glucosa en la sangre. El recuento de carbohidratos es un mtodo destinado a llevar un registro de la cantidad de carbohidratos que se consumen. El recuento de carbohidratos es importante para mantener la glucemia a un nivel saludable, especialmente si utiliza insulina o toma determinados medicamentos por va oral para la diabetes.  Es importante conocer la cantidad de carbohidratos que se pueden ingerir en cada comida sin correr ningn riesgo. Esto es diferente en cada persona. Su nutricionista puede ayudarlo a calcular la cantidad de carbohidratos que debe ingerir en cada comida y en cada  refrigerio.  Entre los alimentos que contienen carbohidratos, se incluyen:   Pan, cereal, arroz, pastas y galletas.   Papas y maz.   Guisantes, frijoles y lentejas.   Leche y yogur.   Frutas y jugo.   Postres, como pasteles, galletas, helado y caramelos.  Cmo me afecta el alcohol?  El alcohol puede provocar disminuciones sbitas de la glucemia (hipoglucemia), especialmente si utiliza insulina o toma determinados medicamentos por va oral para la diabetes. La hipoglucemia es una afeccin potencialmente mortal. Los sntomas de la hipoglucemia (somnolencia, mareos y confusin) son similares a los sntomas de haber consumido demasiado alcohol.  Si el mdico afirma que el alcohol es seguro para usted, siga estas pautas:   Limite el consumo de alcohol a no ms de 1medida por da si es mujer y no est embarazada, y a 2medidas si es hombre. Una medida equivale a 12oz (355ml) de cerveza, 5oz (148ml) de vino o 1oz (44ml) de bebidas alcohlicas de alta graduacin.   No beba con el estmago vaco.   Mantngase hidratado bebiendo agua, refrescos dietticos o t helado sin azcar.   Tenga en cuenta que los refrescos comunes, los jugos y otras bebida para mezclar pueden contener mucha azcar y se deben contar como carbohidratos.  Cules son algunos consejos para seguir este plan?    Leer las etiquetas de los alimentos   Comience por leer el tamao de la porcin en la "Informacin nutricional" en las etiquetas de los alimentos envasados y las bebidas. La cantidad de caloras, carbohidratos, grasas y otros nutrientes mencionados en la etiqueta se basan en una porcin del alimento.   Muchos alimentos contienen ms de una porcin por envase.   Verifique la cantidad total de gramos (g) de carbohidratos totales en una porcin. Puede calcular la cantidad de porciones de carbohidratos al dividir el total de carbohidratos por 15. Por ejemplo, si un alimento tiene un total de 30g de carbohidratos, equivale a 2  porciones de carbohidratos.   Verifique la cantidad de gramos (g) de grasas saturadas y grasas trans en una porcin. Escoja alimentos que no contengan grasa o que tengan un bajo contenido.   Verifique la cantidad de miligramos (mg) de sal (sodio) en una porcin. La mayora de las personas deben limitar la ingesta de sodio total a menos de 2300mg por da.   Siempre consulte la informacin nutricional de los alimentos etiquetados como "con bajo contenido de grasa" o "sin grasa". Estos alimentos pueden tener un mayor contenido de azcar agregada o carbohidratos refinados, y deben evitarse.   Hable con su nutricionista para identificar sus objetivos diarios en cuanto a los nutrientes mencionados en la etiqueta.  Al ir de compras   Evite comprar alimentos procesados, enlatados o precocinados. Estos alimentos tienden a tener una mayor cantidad de grasa, sodio y azcar agregada.   Compre en la zona exterior de la tienda de comestibles. Esta zona incluye frutas y verduras frescas, granos a granel, carnes frescas y productos lcteos frescos.  Al cocinar   Utilice mtodos de coccin a baja temperatura, como hornear, en lugar de mtodos de coccin a alta temperatura, como frer en abundante aceite.   Cocine con aceites saludables, como el aceite de oliva, canola o girasol.   Evite cocinar con manteca, crema o carnes con alto contenido de grasa.  Planificacin de las comidas   Coma las comidas y los refrigerios regularmente, preferentemente a la misma hora todos los das. Evite pasar largos perodos de tiempo sin comer.   Consuma alimentos ricos en fibra, como frutas frescas, verduras, frijoles y cereales integrales. Consulte a su nutricionista sobre cuntas porciones de carbohidratos puede consumir en cada comida.   Consuma entre 4 y 6 onzas (oz) de protenas magras por da, como carnes magras, pollo, pescado, huevos o tofu. Una onza de protena magra equivale a:  ? 1 onza de carne, pollo o  pescado.  ? 1huevo.  ?  taza de tofu.   Coma algunos alimentos por da que contengan grasas saludables, como aguacates, frutos secos, semillas y pescado.  Estilo de vida   Controle su nivel de glucemia con regularidad.   Haga actividad fsica habitualmente como se lo haya indicado el mdico. Esto puede incluir lo siguiente:  ? 150minutos semanales de ejercicio de intensidad moderada o alta. Esto podra incluir caminatas dinmicas, ciclismo o gimnasia acutica.  ? Realizar ejercicios de elongacin y de fortalecimiento, como yoga o levantamiento de pesas, por lo menos 2veces por semana.   Tome los medicamentos como se lo haya indicado el mdico.   No consuma ningn producto que contenga nicotina o tabaco, como cigarrillos y cigarrillos electrnicos. Si necesita ayuda para dejar de fumar, consulte al mdico.   Trabaje con un asesor o instructor en diabetes para identificar estrategias para controlar el estrs y cualquier desafo emocional y social.  Preguntas para hacerle al mdico   Es necesario que consulte a un instructor en el cuidado de la diabetes?   Es necesario que me rena con un nutricionista?   A qu nmero puedo llamar si tengo preguntas?   Cules son los mejores momentos para controlar la glucemia?  Dnde   encontrar ms informacin:   Asociacin Estadounidense de la Diabetes (American Diabetes Association): diabetes.org   Academia de Nutricin y Diettica (Academy of Nutrition and Dietetics): www.eatright.org   Instituto Nacional de la Diabetes y las Enfermedades Digestivas y Renales (National Institute of Diabetes and Digestive and Kidney Diseases, NIH): www.niddk.nih.gov  Resumen   Un plan de alimentacin saludable lo ayudar a controlar la glucemia y mantener un estilo de vida saludable.   Trabajar con un especialista en dietas y nutricin (nutricionista) puede ayudarlo a elaborar el mejor plan de alimentacin para usted.   Tenga en cuenta que los carbohidratos (hidratos de  carbono) y el alcohol tienen efectos inmediatos en sus niveles de glucemia. Es importante contar los carbohidratos que ingiere y consumir alcohol con prudencia.  Esta informacin no tiene como fin reemplazar el consejo del mdico. Asegrese de hacerle al mdico cualquier pregunta que tenga.  Document Released: 01/22/2008 Document Revised: 06/25/2017 Document Reviewed: 02/04/2017  Elsevier Interactive Patient Education  2019 [employer]

## 2019-04-06 ENCOUNTER — Other Ambulatory Visit: Payer: Self-pay

## 2019-04-06 ENCOUNTER — Encounter: Payer: Self-pay | Admitting: Orthopedic Surgery

## 2019-04-06 ENCOUNTER — Ambulatory Visit (INDEPENDENT_AMBULATORY_CARE_PROVIDER_SITE_OTHER): Payer: Self-pay | Admitting: Orthopedic Surgery

## 2019-04-06 VITALS — Ht 67.0 in | Wt 164.0 lb

## 2019-04-06 DIAGNOSIS — I96 Gangrene, not elsewhere classified: Secondary | ICD-10-CM

## 2019-04-06 NOTE — Progress Notes (Signed)
Office Visit Note   Patient: Isaac Ray           Date of Birth: February 07, 1971           MRN: [id number] Visit Date: 04/06/2019              Requested by: Lizbeth Bark, FNP [address] [place], [place] [address] PCP: Lizbeth Bark, FNP  Chief Complaint  Patient presents with  . Left Foot - Pain      HPI: Patient is a 48 year old gentleman with peripheral vascular disease and diabetes he is status post revascularization to the left lower extremity.  Patient complains of painful gangrene and the fourth toe.  Assessment & Plan: Visit Diagnoses:  1. Gangrene of toe of left foot (HCC)     Plan: Recommend patient proceed with amputation of the fourth toe.  He has excellent revascularization with a strong dorsalis pedis pulse of the left foot.  Recommended proceeding with amputation of the fourth toe as an outpatient follow-up in the office in 1 week.  Patient through his interpreter states he understands and wished to proceed with surgery at this time.  Follow-Up Instructions: Return in about 1 week (around 04/13/2019).   Ortho Exam  Patient is alert, oriented, no adenopathy, well-dressed, normal affect, normal respiratory effort. Examination patient has a palpable dorsalis pedis pulse the Doppler was used and he has a triphasic dorsalis pedis pulse there is a dampened biphasic posterior tibial pulse.  Examination of the fourth toe left foot he has dry gangrenous changes of the fourth toe.  Radiographs show no definite osteomyelitis.  There is no ascending cellulitis no sausage digit swelling the fourth toe is thin and atrophic and black.  Imaging: No results found. No images are attached to the encounter.  Labs: Lab Results  Component Value Date   HGBA1C 6.5 (A) 04/02/2019   HGBA1C 6.8 02/28/2017   HGBA1C 8.6 12/05/2016     Lab Results  Component Value Date   ALBUMIN 4.3 01/06/2017   ALBUMIN 3.4 (L) 02/15/2015    Body mass index is 25.69  kg/m.  Orders:  No orders of the defined types were placed in this encounter.  No orders of the defined types were placed in this encounter.    Procedures: No procedures performed  Clinical Data: No additional findings.  ROS:  All other systems negative, except as noted in the HPI. Review of Systems  Objective: Vital Signs: Ht 5\' 7"  (1.702 m)   Wt 164 lb (74.4 kg)   BMI 25.69 kg/m   Specialty Comments:  No specialty comments available.  PMFS History: Patient Active Problem List   Diagnosis Date Noted  . Critical lower limb ischemia 03/30/2019  . Ischemic cardiomyopathy 03/24/2019  . Critical limb ischemia with history of revascularization of same extremity 03/24/2019  . Type 2 diabetes mellitus (HCC) 02/28/2017  . 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 Essential hypertension 04/20/2015  . Hyperlipidemia 04/20/2015  . Diabetes (HCC) 04/20/2015  . 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 NSTEMI (non-ST elevated myocardial infarction) (HCC) 02/12/2015   Past Medical History:  Diagnosis Date  . CAD (coronary artery disease)   . Essential hypertension   . Hyperlipidemia   . Type 2 diabetes mellitus (HCC)     Family History  Problem Relation Age of Onset  . Coronary artery disease Father        had CABG  . Heart disease Father  Past Surgical History:  Procedure Laterality Date  . CORONARY ANGIOPLASTY WITH STENT PLACEMENT  2013  . CORONARY ARTERY BYPASS GRAFT N/A 02/16/2015   Procedure: CORONARY ARTERY BYPASS GRAFTING (CABG) x5 using left internal mammary artery, right thigh greater saphenous vein, and left radial artery. ;  Surgeon: Melrose Nakayama, MD;  Location: [facility];  Service: Open Heart Surgery;  Laterality: N/A;  . LEFT HEART CATHETERIZATION WITH CORONARY ANGIOGRAM N/A 02/14/2015   Procedure: LEFT HEART CATHETERIZATION WITH CORONARY ANGIOGRAM;  Surgeon: Lorretta Harp, MD;  Location: [facility];  Service: Cardiovascular;  Laterality:  N/A;  . LOWER EXTREMITY ANGIOGRAPHY N/A 03/30/2019   Procedure: LOWER EXTREMITY ANGIOGRAPHY;  Surgeon: Lorretta Harp, MD;  Location: [facility] CV LAB;  Service: Cardiovascular;  Laterality: N/A;  . RADIAL ARTERY HARVEST Left 02/16/2015   Procedure: RADIAL ARTERY HARVEST;  Surgeon: Melrose Nakayama, MD;  Location: [place] [facility];  Service: Open Heart Surgery;  Laterality: Left;  . TEE WITHOUT CARDIOVERSION N/A 02/16/2015   Procedure: TRANSESOPHAGEAL ECHOCARDIOGRAM (TEE);  Surgeon: Melrose Nakayama, MD;  Location: [facility];  Service: Open Heart Surgery;  Laterality: N/A;   Social History   Occupational History  . Not on file  Tobacco Use  . Smoking status: Former Smoker    Last attempt to quit: 02/16/2015    Years since quitting: 4.1  . Smokeless tobacco: Never Used  Substance and Sexual Activity  . Alcohol use: No    Alcohol/week: 0.0 standard drinks  . Drug use: No  . Sexual activity: Not on file

## 2019-04-07 ENCOUNTER — Encounter (HOSPITAL_BASED_OUTPATIENT_CLINIC_OR_DEPARTMENT_OTHER): Payer: Self-pay | Attending: Internal Medicine

## 2019-04-07 ENCOUNTER — Other Ambulatory Visit: Payer: Self-pay

## 2019-04-07 ENCOUNTER — Encounter (HOSPITAL_COMMUNITY): Payer: Self-pay | Admitting: *Deleted

## 2019-04-07 ENCOUNTER — Other Ambulatory Visit (HOSPITAL_COMMUNITY)
Admission: RE | Admit: 2019-04-07 | Discharge: 2019-04-07 | Disposition: A | Discharge status: Home / Self Care | Payer: HRSA Program | Source: Ambulatory Visit | Attending: Orthopedic Surgery | Admitting: Orthopedic Surgery

## 2019-04-07 DIAGNOSIS — Z1159 Encounter for screening for other viral diseases: Secondary | ICD-10-CM | POA: Insufficient documentation

## 2019-04-07 DIAGNOSIS — Z01812 Encounter for preprocedural laboratory examination: Secondary | ICD-10-CM | POA: Diagnosis present

## 2019-04-07 LAB — SARS CORONAVIRUS 2 BY RT PCR (HOSPITAL ORDER, PERFORMED IN ~~LOC~~ HOSPITAL LAB): SARS Coronavirus 2: NEGATIVE

## 2019-04-07 NOTE — Progress Notes (Signed)
Mr. Isaac Ray denies chest pain or shortness of breath.I spoke to patient using [address], [place], [place] # [id number]. Patient denies that he nor his family has experienced any of the following: Cough Fever >100.4 Runny Nose Sore Throat Difficulty breathing/ shortness of breath Travel in past 14 days- no I instructed patient to not eat after midnight, but he could drink clear liquids, we reviewed clear liquids, patient said he would drink water.  I asked patient if he has G2 (gatorade) he does, I instructed patient to drink 12 ounces of G2 between 4:00 am and 4:30 AM.  Mr Isaac Ray has type II diabetes, he reports that fasting CBGs run 100- 120. I instructed patient to check CBG after awaking and every 2 hours until arrival  to the hospital.  I Instructed patient if CBG is less than 70 to take 1 tube of Glucose Gel . Recheck CBG in 15 minutes then call pre- op desk at [phone] for further instructions.

## 2019-04-08 ENCOUNTER — Encounter (HOSPITAL_COMMUNITY): Payer: Self-pay

## 2019-04-08 ENCOUNTER — Other Ambulatory Visit: Payer: Self-pay

## 2019-04-08 ENCOUNTER — Ambulatory Visit (HOSPITAL_COMMUNITY): Payer: Self-pay | Admitting: Registered Nurse

## 2019-04-08 ENCOUNTER — Other Ambulatory Visit (HOSPITAL_COMMUNITY): Payer: Self-pay

## 2019-04-08 ENCOUNTER — Ambulatory Visit (HOSPITAL_COMMUNITY)
Admission: RE | Admit: 2019-04-08 | Discharge: 2019-04-08 | Disposition: A | Discharge status: Home / Self Care | Payer: Self-pay | Attending: Orthopedic Surgery | Admitting: Orthopedic Surgery

## 2019-04-08 ENCOUNTER — Ambulatory Visit (HOSPITAL_COMMUNITY): Payer: Self-pay

## 2019-04-08 ENCOUNTER — Encounter (HOSPITAL_COMMUNITY)
Admission: RE | Disposition: A | Discharge status: Home / Self Care | Payer: Self-pay | Source: Home / Self Care | Attending: Orthopedic Surgery

## 2019-04-08 DIAGNOSIS — I96 Gangrene, not elsewhere classified: Secondary | ICD-10-CM

## 2019-04-08 DIAGNOSIS — I10 Essential (primary) hypertension: Secondary | ICD-10-CM | POA: Insufficient documentation

## 2019-04-08 DIAGNOSIS — I2510 Atherosclerotic heart disease of native coronary artery without angina pectoris: Secondary | ICD-10-CM | POA: Insufficient documentation

## 2019-04-08 DIAGNOSIS — Z955 Presence of coronary angioplasty implant and graft: Secondary | ICD-10-CM | POA: Insufficient documentation

## 2019-04-08 DIAGNOSIS — Z87891 Personal history of nicotine dependence: Secondary | ICD-10-CM | POA: Insufficient documentation

## 2019-04-08 DIAGNOSIS — Z951 Presence of aortocoronary bypass graft: Secondary | ICD-10-CM | POA: Insufficient documentation

## 2019-04-08 DIAGNOSIS — I252 Old myocardial infarction: Secondary | ICD-10-CM | POA: Insufficient documentation

## 2019-04-08 DIAGNOSIS — E1152 Type 2 diabetes mellitus with diabetic peripheral angiopathy with gangrene: Secondary | ICD-10-CM | POA: Insufficient documentation

## 2019-04-08 DIAGNOSIS — Z7902 Long term (current) use of antithrombotics/antiplatelets: Secondary | ICD-10-CM | POA: Insufficient documentation

## 2019-04-08 HISTORY — DX: Cardiac murmur, unspecified: R01.1

## 2019-04-08 HISTORY — PX: AMPUTATION: SHX166

## 2019-04-08 LAB — GLUCOSE, CAPILLARY
Glucose-Capillary: 111 mg/dL — ABNORMAL HIGH (ref 70–99)
Glucose-Capillary: 120 mg/dL — ABNORMAL HIGH (ref 70–99)

## 2019-04-08 SURGERY — AMPUTATION DIGIT
Anesthesia: General | Site: Foot | Laterality: Left

## 2019-04-08 MED ORDER — PHENYLEPHRINE HCL (PRESSORS) 10 MG/ML IV SOLN
INTRAVENOUS | Status: DC | PRN
  Administered 2019-04-08: 80 ug via INTRAVENOUS

## 2019-04-08 MED ORDER — OXYCODONE-ACETAMINOPHEN 5-325 MG PO TABS
ORAL_TABLET | ORAL | Status: AC
  Filled 2019-04-08: qty 1

## 2019-04-08 MED ORDER — LACTATED RINGERS IV SOLN
INTRAVENOUS | Status: DC
  Administered 2019-04-08: 12:00:00 via INTRAVENOUS

## 2019-04-08 MED ORDER — ONDANSETRON HCL 4 MG/2ML IJ SOLN
INTRAMUSCULAR | Status: DC | PRN
  Administered 2019-04-08: 4 mg via INTRAVENOUS

## 2019-04-08 MED ORDER — POVIDONE-IODINE 10 % EX SWAB: 2.0000 "application " | Freq: Once | CUTANEOUS | Status: DC

## 2019-04-08 MED ORDER — LACTATED RINGERS IV SOLN
INTRAVENOUS | Status: DC
  Administered 2019-04-08: 13:00:00 via INTRAVENOUS

## 2019-04-08 MED ORDER — FENTANYL CITRATE (PF) 250 MCG/5ML IJ SOLN
INTRAMUSCULAR | Status: DC | PRN
  Administered 2019-04-08: 50 ug via INTRAVENOUS
  Administered 2019-04-08 (×2): 25 ug via INTRAVENOUS
  Administered 2019-04-08: 50 ug via INTRAVENOUS

## 2019-04-08 MED ORDER — 0.9 % SODIUM CHLORIDE (POUR BTL) OPTIME
TOPICAL | Status: DC | PRN
  Administered 2019-04-08: 1000 mL

## 2019-04-08 MED ORDER — CHLORHEXIDINE GLUCONATE 4 % EX LIQD: 60.0000 mL | Freq: Once | CUTANEOUS | Status: DC

## 2019-04-08 MED ORDER — MIDAZOLAM HCL 2 MG/2ML IJ SOLN
INTRAMUSCULAR | Status: AC
  Filled 2019-04-08: qty 2

## 2019-04-08 MED ORDER — FENTANYL CITRATE (PF) 100 MCG/2ML IJ SOLN
25.0000 ug | INTRAMUSCULAR | Status: DC | PRN
  Administered 2019-04-08 (×2): 50 ug via INTRAVENOUS

## 2019-04-08 MED ORDER — PROPOFOL 10 MG/ML IV BOLUS
INTRAVENOUS | Status: AC
  Filled 2019-04-08: qty 20

## 2019-04-08 MED ORDER — LIDOCAINE 2% (20 MG/ML) 5 ML SYRINGE
INTRAMUSCULAR | Status: DC | PRN
  Administered 2019-04-08: 100 mg via INTRAVENOUS

## 2019-04-08 MED ORDER — ENSURE PRE-SURGERY PO LIQD
296.0000 mL | Freq: Once | ORAL | Status: DC
  Filled 2019-04-08: qty 296

## 2019-04-08 MED ORDER — DEXAMETHASONE SODIUM PHOSPHATE 10 MG/ML IJ SOLN
INTRAMUSCULAR | Status: DC | PRN
  Administered 2019-04-08: 10 mg via INTRAVENOUS

## 2019-04-08 MED ORDER — FENTANYL CITRATE (PF) 100 MCG/2ML IJ SOLN
INTRAMUSCULAR | Status: AC
  Filled 2019-04-08: qty 2

## 2019-04-08 MED ORDER — OXYCODONE-ACETAMINOPHEN 5-325 MG PO TABS: 1.0000 | ORAL_TABLET | ORAL | 0 refills | Status: DC | PRN

## 2019-04-08 MED ORDER — FENTANYL CITRATE (PF) 250 MCG/5ML IJ SOLN
INTRAMUSCULAR | Status: AC
  Filled 2019-04-08: qty 5

## 2019-04-08 MED ORDER — OXYCODONE-ACETAMINOPHEN 5-325 MG PO TABS
1.0000 | ORAL_TABLET | ORAL | Status: DC | PRN
  Administered 2019-04-08: 14:00:00 1 via ORAL

## 2019-04-08 MED ORDER — CEFAZOLIN SODIUM-DEXTROSE 2-4 GM/100ML-% IV SOLN
2.0000 g | INTRAVENOUS | Status: AC
  Administered 2019-04-08: 2 g via INTRAVENOUS

## 2019-04-08 MED ORDER — CEFAZOLIN SODIUM-DEXTROSE 2-4 GM/100ML-% IV SOLN
INTRAVENOUS | Status: AC
  Filled 2019-04-08: qty 100

## 2019-04-08 MED ORDER — LIDOCAINE 2% (20 MG/ML) 5 ML SYRINGE
INTRAMUSCULAR | Status: AC
  Filled 2019-04-08: qty 5

## 2019-04-08 MED ORDER — MIDAZOLAM HCL 5 MG/5ML IJ SOLN
INTRAMUSCULAR | Status: DC | PRN
  Administered 2019-04-08: 2 mg via INTRAVENOUS

## 2019-04-08 MED ORDER — ACETAMINOPHEN 500 MG PO TABS: 1000.0000 mg | ORAL_TABLET | Freq: Once | ORAL | Status: DC

## 2019-04-08 MED ORDER — CALCIUM CHLORIDE 10 % IV SOLN
INTRAVENOUS | Status: AC
  Filled 2019-04-08: qty 10

## 2019-04-08 SURGICAL SUPPLY — 30 items
BLADE SURG 21 STRL SS (BLADE) ×2 IMPLANT
BNDG COHESIVE 4X5 TAN STRL (GAUZE/BANDAGES/DRESSINGS) ×2 IMPLANT
BNDG ESMARK 4X9 LF (GAUZE/BANDAGES/DRESSINGS) IMPLANT
BNDG GAUZE ELAST 4 BULKY (GAUZE/BANDAGES/DRESSINGS) ×2 IMPLANT
COVER SURGICAL LIGHT HANDLE (MISCELLANEOUS) ×4 IMPLANT
COVER WAND RF STERILE (DRAPES) ×2 IMPLANT
DRAPE U-SHAPE 47X51 STRL (DRAPES) ×2 IMPLANT
DRSG ADAPTIC 3X8 NADH LF (GAUZE/BANDAGES/DRESSINGS) ×2 IMPLANT
DRSG PAD ABDOMINAL 8X10 ST (GAUZE/BANDAGES/DRESSINGS) ×2 IMPLANT
DURAPREP 26ML APPLICATOR (WOUND CARE) ×2 IMPLANT
ELECT REM PT RETURN 9FT ADLT (ELECTROSURGICAL) ×2
ELECTRODE REM PT RTRN 9FT ADLT (ELECTROSURGICAL) ×1 IMPLANT
GAUZE SPONGE 4X4 12PLY STRL (GAUZE/BANDAGES/DRESSINGS) IMPLANT
GAUZE SPONGE 4X4 12PLY STRL LF (GAUZE/BANDAGES/DRESSINGS) ×2 IMPLANT
GLOVE BIOGEL PI IND STRL 9 (GLOVE) ×1 IMPLANT
GLOVE BIOGEL PI INDICATOR 9 (GLOVE) ×1
GLOVE SURG ORTHO 9.0 STRL STRW (GLOVE) ×2 IMPLANT
GOWN STRL REUS W/ TWL XL LVL3 (GOWN DISPOSABLE) ×2 IMPLANT
GOWN STRL REUS W/TWL XL LVL3 (GOWN DISPOSABLE) ×2
KIT BASIN OR (CUSTOM PROCEDURE TRAY) ×2 IMPLANT
KIT TURNOVER KIT B (KITS) ×2 IMPLANT
MANIFOLD NEPTUNE II (INSTRUMENTS) ×2 IMPLANT
NEEDLE 22X1 1/2 (OR ONLY) (NEEDLE) IMPLANT
NS IRRIG 1000ML POUR BTL (IV SOLUTION) ×2 IMPLANT
PACK ORTHO EXTREMITY (CUSTOM PROCEDURE TRAY) ×2 IMPLANT
PAD ABD 8X10 STRL (GAUZE/BANDAGES/DRESSINGS) ×2 IMPLANT
PAD ARMBOARD 7.5X6 YLW CONV (MISCELLANEOUS) ×4 IMPLANT
SUT ETHILON 2 0 PSLX (SUTURE) ×2 IMPLANT
SYR CONTROL 10ML LL (SYRINGE) IMPLANT
TOWEL OR 17X26 10 PK STRL BLUE (TOWEL DISPOSABLE) ×2 IMPLANT

## 2019-04-08 NOTE — Transfer of Care (Signed)
Immediate Anesthesia Transfer of Care Note  Patient: Isaac Ray  Procedure(s) Performed: LEFT FOOT 4TH TOE AMPUTATION (Left Foot)  Patient Location: PACU  Anesthesia Type:General  Level of Consciousness: awake, alert  and oriented  Airway & Oxygen Therapy: Patient Spontanous Breathing and Patient connected to face mask oxygen  Post-op Assessment: Report given to RN and Post -op Vital signs reviewed and stable  Post vital signs: Reviewed and stable  Last Vitals:  Vitals Value Taken Time  BP 146/84   Temp    Pulse 82 04/08/2019  1:58 PM  Resp 17 04/08/2019  1:58 PM  SpO2 100 % 04/08/2019  1:58 PM  Vitals shown include unvalidated device data.  Last Pain:  Vitals:   04/08/19 1046  TempSrc: Oral         Complications: No apparent anesthesia complications

## 2019-04-08 NOTE — Progress Notes (Signed)
Orthopedic Tech Progress Note Patient Details:  Isaac Ray April 10, 1971 [id number] PACU RN called requesting crutches and a post op shoe Ortho Devices Type of Ortho Device: Crutches, Postop shoe/boot Ortho Device/Splint Location: ULE Ortho Device/Splint Interventions: Adjustment, Ordered, Application   Post Interventions Patient Tolerated: Well Instructions Provided: Care of device, Adjustment of device   Janit Pagan 04/08/2019, 2:56 PM

## 2019-04-08 NOTE — Anesthesia Preprocedure Evaluation (Addendum)
Anesthesia Evaluation  Patient identified by MRN, date of birth, ID band Patient awake    Reviewed: Allergy & Precautions, NPO status , Patient's Chart, lab work & pertinent test results  Airway Mallampati: II  TM Distance: >3 FB Neck ROM: Full    Dental no notable dental hx. (+) Missing, Dental Advisory Given,    Pulmonary neg pulmonary ROS, former smoker,    Pulmonary exam normal breath sounds clear to auscultation       Cardiovascular hypertension, Pt. on home beta blockers and Pt. on medications + CAD, + Past MI, + Cardiac Stents (2013) and + CABG (2016)  Normal cardiovascular exam Rhythm:Regular Rate:Normal  Per chart, normal stress test and normal TTE in 08/2018  TTE 2016 - Left ventricle: The cavity size was moderately dilated. Wall thickness was normal. Systolic function was moderately to severely reduced. The estimated ejection fraction was in the range of 30% to 35%. Moderate diffuse hypokinesis with distinct regional wall motion abnormalities. Akinesis of the basal-midanterolateral and inferolateral myocardium. Aneurysmal deformity of the basalinferior myocardium. Hypokinesis of the apical myocardium. Doppler parameters are consistent with   abnormal left ventricular relaxation (grade 1 diastolic   dysfunction). Acoustic contrast opacification revealed no   evidence ofthrombus. - Left atrium: The atrium was mildly dilated. - Right ventricle: Systolic function was mildly reduced   Neuro/Psych negative neurological ROS  negative psych ROS   GI/Hepatic negative GI ROS, Neg liver ROS,   Endo/Other  negative endocrine ROSdiabetes  Renal/GU negative Renal ROS  negative genitourinary   Musculoskeletal   Abdominal   Peds  Hematology  (+) Blood dyscrasia (on plavix), ,   Anesthesia Other Findings   Reproductive/Obstetrics negative OB ROS                           Anesthesia  Physical Anesthesia Plan  ASA: III  Anesthesia Plan: General   Post-op Pain Management:    Induction: Intravenous  PONV Risk Score and Plan: 2 and Ondansetron, Dexamethasone and Midazolam  Airway Management Planned: LMA  Additional Equipment:   Intra-op Plan:   Post-operative Plan: Extubation in OR  Informed Consent: I have reviewed the patients History and Physical, chart, labs and discussed the procedure including the risks, benefits and alternatives for the proposed anesthesia with the patient or authorized representative who has indicated his/her understanding and acceptance.     Dental advisory given  Plan Discussed with: CRNA  Anesthesia Plan Comments:         Anesthesia Quick Evaluation

## 2019-04-08 NOTE — H&P (Signed)
Isaac Ray is an 48 y.o. male.   Chief Complaint: Gangrene left foot fourth toe HPI: Patient is a 48 year old gentleman with peripheral vascular disease and diabetes he is status post revascularization to the left lower extremity.  Patient complains of painful gangrene and the fourth toe.  Past Medical History:  Diagnosis Date  . CAD (coronary artery disease)   . Essential hypertension   . Heart murmur    when I was a teenager- no mention of it now  . Hyperlipidemia   . Type 2 diabetes mellitus ([name])    Type II    Past Surgical History:  Procedure Laterality Date  . CORONARY ANGIOPLASTY WITH STENT PLACEMENT  2013  . CORONARY ARTERY BYPASS GRAFT N/A 02/16/2015   Procedure: CORONARY ARTERY BYPASS GRAFTING (CABG) x5 using left internal mammary artery, right thigh greater saphenous vein, and left radial artery. ;  Surgeon: Melrose Nakayama, MD;  Location: [facility];  Service: Open Heart Surgery;  Laterality: N/A;  . LEFT HEART CATHETERIZATION WITH CORONARY ANGIOGRAM N/A 02/14/2015   Procedure: LEFT HEART CATHETERIZATION WITH CORONARY ANGIOGRAM;  Surgeon: Lorretta Harp, MD;  Location: [facility] CATH LAB;  Service: Cardiovascular;  Laterality: N/A;  . LOWER EXTREMITY ANGIOGRAPHY N/A 03/30/2019   Procedure: LOWER EXTREMITY ANGIOGRAPHY;  Surgeon: Lorretta Harp, MD;  Location: [place] CV LAB;  Service: Cardiovascular;  Laterality: N/A;  . RADIAL ARTERY HARVEST Left 02/16/2015   Procedure: RADIAL ARTERY HARVEST;  Surgeon: Melrose Nakayama, MD;  Location: [facility];  Service: Open Heart Surgery;  Laterality: Left;  . TEE WITHOUT CARDIOVERSION N/A 02/16/2015   Procedure: TRANSESOPHAGEAL ECHOCARDIOGRAM (TEE);  Surgeon: Melrose Nakayama, MD;  Location: [place] [facility];  Service: Open Heart Surgery;  Laterality: N/A;    Family History  Problem Relation Age of Onset  . Coronary artery disease Father        had CABG  . Heart disease Father    Social History:  reports that he quit smoking  about 4 years ago. He quit after 30.00 years of use. He has never used smokeless tobacco. He reports that he does not drink alcohol or use drugs.  Allergies: No Known Allergies  No medications prior to admission.    Results for orders placed or performed during the hospital encounter of 04/07/19 (from the past 48 hour(s))  SARS Coronavirus 2 (CEPHEID - Performed in [facility] hospital lab), Hosp Order     Status: None   Collection Time: 04/07/19  3:56 PM  Result Value Ref Range   SARS Coronavirus 2 NEGATIVE NEGATIVE    Comment: (NOTE) If result is NEGATIVE SARS-CoV-2 target nucleic acids are NOT DETECTED. The SARS-CoV-2 RNA is generally detectable in upper and lower  respiratory specimens during the acute phase of infection. The lowest  concentration of SARS-CoV-2 viral copies this assay can detect is 250  copies / mL. A negative result does not preclude SARS-CoV-2 infection  and should not be used as the sole basis for treatment or other  patient management decisions.  A negative result may occur with  improper specimen collection / handling, submission of specimen other  than nasopharyngeal swab, presence of viral mutation(s) within the  areas targeted by this assay, and inadequate number of viral copies  (<250 copies / mL). A negative result must be combined with clinical  observations, patient history, and epidemiological information. If result is POSITIVE SARS-CoV-2 target nucleic acids are DETECTED. The SARS-CoV-2 RNA is generally detectable in upper and lower  respiratory specimens dur ing the acute phase of infection.  Positive  results are indicative of active infection with SARS-CoV-2.  Clinical  correlation with patient history and other diagnostic information is  necessary to determine patient infection status.  Positive results do  not rule out bacterial infection or co-infection with other viruses. If result is PRESUMPTIVE POSTIVE SARS-CoV-2 nucleic acids MAY BE  PRESENT.   A presumptive positive result was obtained on the submitted specimen  and confirmed on repeat testing.  While 2019 novel coronavirus  (SARS-CoV-2) nucleic acids may be present in the submitted sample  additional confirmatory testing may be necessary for epidemiological  and / or clinical management purposes  to differentiate between  SARS-CoV-2 and other Sarbecovirus currently known to infect humans.  If clinically indicated additional testing with an alternate test  methodology [id number]) is advised. The SARS-CoV-2 RNA is generally  detectable in upper and lower respiratory sp ecimens during the acute  phase of infection. The expected result is Negative. Fact Sheet for Patients:  BoilerBrush.com.cy Fact Sheet for Healthcare Providers: https://pope.com/ This test is not yet approved or cleared by the [place] FDA and has been authorized for detection and/or diagnosis of SARS-CoV-2 by FDA under an Emergency Use Authorization (EUA).  This EUA will remain in effect (meaning this test can be used) for the duration of the COVID-19 declaration under Section 564(b)(1) of the Act, 21 U.S.C. section 360bbb-3(b)(1), unless the authorization is terminated or revoked sooner. Performed at [facility], [address], [place], [place] [address]    No results found.  Review of Systems  All other systems reviewed and are negative.   There were no vitals taken for this visit. Physical Exam  Patient is alert, oriented, no adenopathy, well-dressed, normal affect, normal respiratory effort. Examination patient has a palpable dorsalis pedis pulse the Doppler was used and he has a triphasic dorsalis pedis pulse there is a dampened biphasic posterior tibial pulse.  Examination of the fourth toe left foot he has dry gangrenous changes of the fourth toe.  Radiographs show no definite osteomyelitis.  There is no ascending  cellulitis no sausage digit swelling the fourth toe is thin and atrophic and black. Assessment/Plan 1. Gangrene of toe of left foot (HCC)     Plan: Recommend patient proceed with amputation of the fourth toe.  He has excellent revascularization with a strong dorsalis pedis pulse of the left foot.  Recommended proceeding with amputation of the fourth toe as an outpatient follow-up in the office in 1 week.  Patient through his interpreter states he understands and wished to proceed with surgery at this time.    Nadara Mustard, MD 04/08/2019, 7:16 AM

## 2019-04-08 NOTE — Op Note (Signed)
04/08/2019  1:48 PM  PATIENT:  Isaac Ray    PRE-OPERATIVE DIAGNOSIS:  Gangrene Left 4th Toe  POST-OPERATIVE DIAGNOSIS:  Same  PROCEDURE:  LEFT FOOT 4TH TOE AMPUTATION  SURGEON:  Newt Minion, MD  PHYSICIAN ASSISTANT:None ANESTHESIA:   General  PREOPERATIVE INDICATIONS:  [name] is a  48 y.o. male with a diagnosis of Gangrene Left 4th Toe who failed conservative measures and elected for surgical management.    The risks benefits and alternatives were discussed with the patient preoperatively including but not limited to the risks of infection, bleeding, nerve injury, cardiopulmonary complications, the need for revision surgery, among others, and the patient was willing to proceed.  OPERATIVE IMPLANTS: None  @ENCIMAGES @  OPERATIVE FINDINGS: Destruction of the fourth metatarsal head which was resected  OPERATIVE PROCEDURE: Patient was brought the operating room and underwent a general anesthetic.  After adequate levels anesthesia were obtained patient's left lower extremity was prepped using DuraPrep draped into a sterile field a timeout was called.  The incision was made around the left foot fourth toe this was carried down and the toe was amputated through the MTP joint.  There is complete destruction of the proximal phalanx.  The infection involved the metatarsal head as well and the metatarsal head was resected.  The cartilage was delaminated off the metatarsal head.  Electrocautery was used for hemostasis the wound was irrigated with normal saline 2-0 nylon was used for closure sterile dressing was applied patient was extubated taken to PACU in stable condition.   DISCHARGE PLANNING:  Antibiotic duration: Preoperative antibiotics  Weightbearing: Touchdown weightbearing on the left  Pain medication: Prescription for Percocet [facility]  Dressing care/ Wound VAC: Follow-up 1 week to change the dressing  Ambulatory  devices: Crutches  Discharge to: Home  Follow-up: In the office 1 week post operative.

## 2019-04-08 NOTE — Anesthesia Postprocedure Evaluation (Signed)
Anesthesia Post Note  Patient: Denis Carreon Lozano-Villareal  Procedure(s) Performed: LEFT FOOT 4TH TOE AMPUTATION (Left Foot)     Patient location during evaluation: PACU Anesthesia Type: General Level of consciousness: awake and alert Pain management: pain level controlled Vital Signs Assessment: post-procedure vital signs reviewed and stable Respiratory status: spontaneous breathing, nonlabored ventilation, respiratory function stable and patient connected to nasal cannula oxygen Cardiovascular status: blood pressure returned to baseline and stable Postop Assessment: no apparent nausea or vomiting Anesthetic complications: no    Last Vitals:  Vitals:   04/08/19 1415 04/08/19 1430  BP: (!) 156/98 (!) 166/99  Pulse: 86   Resp: 11 16  Temp:  36.7 C  SpO2: 94% 100%    Last Pain:  Vitals:   04/08/19 1355  TempSrc:   PainSc: Asleep                 Sinan Tuch L Kimyah Frein

## 2019-04-09 ENCOUNTER — Encounter (HOSPITAL_COMMUNITY): Payer: Self-pay | Admitting: Orthopedic Surgery

## 2019-04-09 LAB — GLUCOSE, CAPILLARY: Glucose-Capillary: 107 mg/dL — ABNORMAL HIGH (ref 70–99)

## 2019-04-10 ENCOUNTER — Encounter: Payer: Self-pay | Admitting: Cardiovascular Disease

## 2019-04-10 ENCOUNTER — Ambulatory Visit (INDEPENDENT_AMBULATORY_CARE_PROVIDER_SITE_OTHER): Payer: Self-pay | Admitting: Cardiovascular Disease

## 2019-04-10 ENCOUNTER — Other Ambulatory Visit: Payer: Self-pay

## 2019-04-10 DIAGNOSIS — I70229 Atherosclerosis of native arteries of extremities with rest pain, unspecified extremity: Secondary | ICD-10-CM

## 2019-04-10 DIAGNOSIS — I998 Other disorder of circulatory system: Secondary | ICD-10-CM

## 2019-04-10 NOTE — Progress Notes (Signed)
04/10/2019 [place]   [birth year]  [id number]  Primary Physician Alfonse Spruce, FNP Primary Cardiologist: Lorretta Harp MD Isaac Ray, [place]  HPI:  Isaac Ray is a 48 y.o.  married Latino male with a prior history of CAD status post stenting in 2013 at [facility] in [place].  I last saw him in the office 03/24/2019.  He has a history of treated hypertension, diabetes hyperlipidemia and continued tobacco abuse. He was admitted with acute coronary syndrome and had minimally elevated enzymes with nonspecific ST and T-wave changes. I performed a diagnostic coronary arteriography via the right radial approach revealing three-vessel disease with severe LV dysfunction. 2 days later he underwent coronary artery bypass grafting by Dr. Roxan Hockey with 5 grafts placed including a LIMA to the LAD, sequential vein to OM1 and 2 and sequential left radial to diagonal branch 1 and 2. He did apparently stop smoking. A month after discharge he developed nocturnal chest pressure similar to his preintervention symptoms.  He had a normal 2D echo 09/17/2018 and Myoview stress test as well.  He stopped smoking 2 years ago.  He returned from [place] several months ago where he was since I last saw him because of a painful infected left fourth toe.  He was seen in the emergency room for this 01/21/2019.  He has been seeing Dr. Dellia Nims as well as the wound care center.  He apparently had IM antibiotics in [place] for this prior to return to [place].  He had lower extremity arterial Doppler studies performed in our office 02/10/2019 revealing a right ABI 0.72 and a left ABI 0.64 left fourth toe pressure was abnormal.  He does complain of claudication left greater than right for the last year.  I performed peripheral angiography on him 03/30/2019 revealing a high-grade fairly focal left common femoral artery stenosis with one-vessel runoff via the anterior tibial.   I performed directional atherectomy and drug-coated balloon angioplasty.  10 days later he underwent left fourth toe amputation by Dr. Sharol Given.  This is currently wrapped.  He denies pain here.   No outpatient medications have been marked as taking for the 04/10/19 encounter (Office Visit) with Lorretta Harp, MD.     No Known Allergies  Social History   Socioeconomic History  . Marital status: Single    Spouse name: Not on file  . Number of children: Not on file  . Years of education: Not on file  . Highest education level: Not on file  Occupational History  . Not on file  Social Needs  . Financial resource strain: Not on file  . Food insecurity    Worry: Not on file    Inability: Not on file  . Transportation needs    Medical: Not on file    Non-medical: Not on file  Tobacco Use  . Smoking status: Former Smoker    Years: 30.00    Quit date: 02/16/2015    Years since quitting: 4.1  . Smokeless tobacco: Never Used  Substance and Sexual Activity  . Alcohol use: No    Alcohol/week: 0.0 standard drinks  . Drug use: No  . Sexual activity: Not on file  Lifestyle  . Physical activity    Days per week: Not on file    Minutes per session: Not on file  . Stress: Not on file  Relationships  . Social connections    Talks on phone: Not on file  Gets together: Not on file    Attends religious service: Not on file    Active member of club or organization: Not on file    Attends meetings of clubs or organizations: Not on file    Relationship status: Not on file  . Intimate partner violence    Fear of current or ex partner: Not on file    Emotionally abused: Not on file    Physically abused: Not on file    Forced sexual activity: Not on file  Other Topics Concern  . Not on file  Social History Narrative  . Not on file     Review of Systems: General: negative for chills, fever, night sweats or weight changes.  Cardiovascular: negative for chest pain, dyspnea on  exertion, edema, orthopnea, palpitations, paroxysmal nocturnal dyspnea or shortness of breath Dermatological: negative for rash Respiratory: negative for cough or wheezing Urologic: negative for hematuria Abdominal: negative for nausea, vomiting, diarrhea, bright red blood per rectum, melena, or hematemesis Neurologic: negative for visual changes, syncope, or dizziness All other systems reviewed and are otherwise negative except as noted above.    Blood pressure (!) 104/52, pulse (!) 54, temperature 98.6 F (37 C), height 5\' 7"  (1.702 m), weight 165 lb (74.8 kg).  General appearance: alert and no distress Neck: no adenopathy, no carotid bruit, no JVD, supple, symmetrical, trachea midline and thyroid not enlarged, symmetric, no tenderness/mass/nodules Lungs: clear to auscultation bilaterally Heart: regular rate and rhythm, S1, S2 normal, no murmur, click, rub or gallop Extremities: His left foot is wrapped. Pulses: Diminished pedal pulses Skin: Skin color, texture, turgor normal. No rashes or lesions Neurologic: Alert and oriented X 3, normal strength and tone. Normal symmetric reflexes. Normal coordination and gait  EKG not performed today  ASSESSMENT AND PLAN:   Critical lower limb ischemia History of critical limb ischemia with a gangrenous left fourth toe status post directional atherectomy and drug-coated balloon angioplasty of a high-grade left common femoral stenosis on 03/30/2019.  Did have one-vessel runoff via an anterior tibial which was a dominant vessel.  1-1/2 weeks later on 04/08/2019 he underwent left fourth toe amputation by Dr. Lajoyce Corners.  We will get follow-up Doppler studies on him and I will see him back in 3 months.      Runell Gess MD FACP,FACC,FAHA, [facility] 04/10/2019 10:19 AM

## 2019-04-10 NOTE — Patient Instructions (Signed)
Medication Instructions:  Your physician recommends that you continue on your current medications as directed. Please refer to the Current Medication list given to you today.  If you need a refill on your cardiac medications before your next appointment, please call your pharmacy.   Lab work: none If you have labs (blood work) drawn today and your tests are completely normal, you will receive your results only by: Marland Kitchen MyChart Message (if you have MyChart) OR . A paper copy in the mail If you have any lab test that is abnormal or we need to change your treatment, we will call you to review the results.  Testing/Procedures: Your physician has requested that you have a lower or upper extremity arterial duplex. This test is an ultrasound of the arteries in the legs or arms. It looks at arterial blood flow in the legs and arms. Allow one hour for Lower and Upper Arterial scans. There are no restrictions or special instructions.  Your physician has requested that you have an ankle brachial index (ABI). During this test an ultrasound and blood pressure cuff are used to evaluate the arteries that supply the arms and legs with blood. Allow thirty minutes for this exam. There are no restrictions or special instructions.    Follow-Up: At [facility], you and your health needs are our priority.  As part of our continuing mission to provide you with exceptional heart care, we have created designated Provider Care Teams.  These Care Teams include your primary Cardiologist (physician) and Advanced Practice Providers (APPs -  Physician Assistants and Nurse Practitioners) who all work together to provide you with the care you need, when you need it. You will need a follow up appointment in 3 months with Dr. Gwenlyn Found.  Please call our office 2 months in advance to schedule this appointment.

## 2019-04-10 NOTE — Assessment & Plan Note (Signed)
History of critical limb ischemia with a gangrenous left fourth toe status post directional atherectomy and drug-coated balloon angioplasty of a high-grade left common femoral stenosis on 03/30/2019.  Did have one-vessel runoff via an anterior tibial which was a dominant vessel.  1-1/2 weeks later on 04/08/2019 he underwent left fourth toe amputation by Dr. Sharol Given.  We will get follow-up Doppler studies on him and I will see him back in 3 months.

## 2019-04-12 ENCOUNTER — Encounter: Payer: Self-pay | Admitting: Primary Care

## 2019-04-13 ENCOUNTER — Encounter: Payer: Self-pay | Admitting: Orthopedic Surgery

## 2019-04-13 ENCOUNTER — Ambulatory Visit (INDEPENDENT_AMBULATORY_CARE_PROVIDER_SITE_OTHER): Payer: Self-pay | Admitting: Physician Assistant

## 2019-04-13 ENCOUNTER — Other Ambulatory Visit: Payer: Self-pay

## 2019-04-13 VITALS — Ht 67.0 in | Wt 165.0 lb

## 2019-04-13 DIAGNOSIS — E1142 Type 2 diabetes mellitus with diabetic polyneuropathy: Secondary | ICD-10-CM

## 2019-04-13 DIAGNOSIS — Z89422 Acquired absence of other left toe(s): Secondary | ICD-10-CM

## 2019-04-13 DIAGNOSIS — I779 Disorder of arteries and arterioles, unspecified: Secondary | ICD-10-CM

## 2019-04-13 NOTE — Progress Notes (Signed)
Office Visit Note   Patient: Isaac Ray           Date of Birth: January 14, 1971           MRN: [id number] Visit Date: 04/13/2019              Requested by: Alfonse Spruce, [place],  [place] [address] PCP: Alfonse Spruce, FNP  Chief Complaint  Patient presents with  . Left Foot - Routine Post Op    Left foot 4th toe amp 04/08/19      HPI: The patient is a 48 year old gentleman who is seen for postoperative follow-up following a left foot fourth toe amputation for gangrene on 04/08/2019.  He reports no pain over the area.  He has left his operative dressing intact.  He has been trying to elevate and offload the area is much as possible.  He is walking with his crutches and a postoperative shoe. The patient is seen with the assistance of a Spanish interpreter today. Assessment & Plan: Visit Diagnoses:  1. Status post amputation of lesser toe of left foot ([place])   2. Type 2 diabetes mellitus with diabetic polyneuropathy, without long-term current use of insulin ([place])   3. PAOD (peripheral arterial occlusive disease) ([place])     Plan: Operative dressings were removed.  Sutures were left intact and will leave in for at least 1 more week.  The patient was recommended to continue to offload and elevate as much as possible.  He can wash the foot with soap and water and pat dry and apply gauze and Ace wrapping.   He will follow-up in 1 week.  Follow-Up Instructions: No follow-ups on file.   Ortho Exam  Patient is alert, oriented, no adenopathy, well-dressed, normal affect, normal respiratory effort. The left 4th toe amputation site is healing well and sutures are intact.  There is no drainage.  No signs of cellulitis or infection.  He has good palpable pedal pulses.  Imaging: No results found.   Labs: Lab Results  Component Value Date   HGBA1C 6.5 (A) 04/02/2019   HGBA1C 6.8 02/28/2017   HGBA1C 8.6 12/05/2016     Lab Results  Component  Value Date   ALBUMIN 4.3 01/06/2017   ALBUMIN 3.4 (L) 02/15/2015    Body mass index is 25.84 kg/m.  Orders:  No orders of the defined types were placed in this encounter.  No orders of the defined types were placed in this encounter.    Procedures: No procedures performed  Clinical Data: No additional findings.  ROS:  All other systems negative, except as noted in the HPI. Review of Systems  Objective: Vital Signs: Ht 5\' 7"  (1.702 m)   Wt 165 lb (74.8 kg)   BMI 25.84 kg/m   Specialty Comments:  No specialty comments available.  PMFS History: Patient Active Problem List   Diagnosis Date Noted  . Gangrene of toe of left foot ([place])   . Critical lower limb ischemia 03/30/2019  . Ischemic cardiomyopathy 03/24/2019  . Critical limb ischemia with history of revascularization of same extremity 03/24/2019  . Type 2 diabetes mellitus ([place]) 02/28/2017  . PAOD (peripheral arterial occlusive disease) ([place]) 02/08/2017  . Essential hypertension 04/20/2015  . Hyperlipidemia 04/20/2015  . Diabetes ([place]) 04/20/2015  . 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 NSTEMI (non-ST elevated myocardial infarction) ([place]) 02/12/2015   Past Medical History:  Diagnosis Date  . CAD (coronary artery disease)   .  Essential hypertension   . Heart murmur    when I was a teenager- no mention of it now  . Hyperlipidemia   . Type 2 diabetes mellitus (HCC)    Type II    Family History  Problem Relation Age of Onset  . Coronary artery disease Father        had CABG  . Heart disease Father     Past Surgical History:  Procedure Laterality Date  . AMPUTATION Left 04/08/2019   Procedure: LEFT FOOT 4TH TOE AMPUTATION;  Surgeon: Nadara Mustard, MD;  Location[facility];  Service: Orthopedics;  Laterality: Left;  . CORONARY ANGIOPLASTY WITH STENT PLACEMENT  2013  . CORONARY ARTERY BYPASS GRAFT N/A 02/16/2015   Procedure: CORONARY ARTERY BYPASS GRAFTING (CABG) x5 using left internal mammary artery, right thigh  greater saphenous vein, and left radial artery. ;  Surgeon: [name], MD;  Location: [facility] OR;  Service: Open Heart Surgery;  Laterality: N/A;  . LEFT HEART CATHETERIZATION WITH CORONARY ANGIOGRAM N/A 02/14/2015   Procedure: LEFT HEART CATHETERIZATION WITH CORONARY ANGIOGRAM;  Surgeon: Runell Gess, MD;  Location: [facility] CATH LAB;  Service: Cardiovascular;  Laterality: N/A;  . LOWER EXTREMITY ANGIOGRAPHY N/A 03/30/2019   Procedure: LOWER EXTREMITY ANGIOGRAPHY;  Surgeon: Runell Gess, MD;  Location: [facility] INVASIVE CV LAB;  Service: Cardiovascular;  Laterality: N/A;  . RADIAL ARTERY HARVEST Left 02/16/2015   Procedure: RADIAL ARTERY HARVEST;  Surgeon: [name], MD;  Location: [facility] OR;  Service: Open Heart Surgery;  Laterality: Left;  . TEE WITHOUT CARDIOVERSION N/A 02/16/2015   Procedure: TRANSESOPHAGEAL ECHOCARDIOGRAM (TEE);  Surgeon: [name], MD;  Location: [facility] OR;  Service: Open Heart Surgery;  Laterality: N/A;   Social History   Occupational History  . Not on file  Tobacco Use  . Smoking status: Former Smoker    Years: 30.00    Quit date: 02/16/2015    Years since quitting: 4.1  . Smokeless tobacco: Never Used  Substance and Sexual Activity  . Alcohol use: No    Alcohol/week: 0.0 standard drinks  . Drug use: No  . Sexual activity: Not on file

## 2019-04-20 ENCOUNTER — Other Ambulatory Visit: Payer: Self-pay

## 2019-04-20 ENCOUNTER — Encounter: Payer: Self-pay | Admitting: Orthopedic Surgery

## 2019-04-20 ENCOUNTER — Ambulatory Visit (INDEPENDENT_AMBULATORY_CARE_PROVIDER_SITE_OTHER): Payer: Self-pay | Admitting: Physician Assistant

## 2019-04-20 VITALS — Ht 67.0 in | Wt 165.0 lb

## 2019-04-20 DIAGNOSIS — Z89422 Acquired absence of other left toe(s): Secondary | ICD-10-CM

## 2019-04-20 DIAGNOSIS — I779 Disorder of arteries and arterioles, unspecified: Secondary | ICD-10-CM

## 2019-04-20 DIAGNOSIS — E1142 Type 2 diabetes mellitus with diabetic polyneuropathy: Secondary | ICD-10-CM

## 2019-04-20 NOTE — Progress Notes (Signed)
Office Visit Note   Patient: Isaac Ray           Date of Birth: 1970/11/14           MRN: [id number] Visit Date: 04/20/2019              Requested by: Lizbeth Bark, FNP [address] [place],  [place] [address] PCP: Lizbeth Bark, FNP  Chief Complaint  Patient presents with  . Left Foot - Routine Post Op    04/08/2019 left foot 4th toe amputation       HPI: The patient is a 48 yo gentleman who is seen for post operative follow up following a left foot fourth toe amputation for gangrene on 04/08/2019.  12 days post op: He has been non weight bearing with crutches and reports no pain over the area. He is washing daily with soap and water and applying a dry dressing to the operative site.  He is seen today with the assistance of a Spanish interpreter.   Assessment & Plan: Visit Diagnoses:  1. Status post amputation of lesser toe of left foot (HCC)   2. Type 2 diabetes mellitus with diabetic polyneuropathy, without long-term current use of insulin (HCC)   3. PAOD (peripheral arterial occlusive disease) (HCC)     Plan: Continue daily washing and dry dressings. Continue non weight bearing and crutches. Follow up in 1 week for suture removal.   Follow-Up Instructions: Return in about 1 week (around 04/27/2019).   Ortho Exam  Patient is alert, oriented, no adenopathy, well-dressed, normal affect, normal respiratory effort. Sutures are still under some mild tension but the incision is healing well and without signs of infection or cellulitis. Good palpable pedal pulse. Mild edema about surgical site. No drainage.   Imaging: No results found. No images are attached to the encounter.  Labs: Lab Results  Component Value Date   HGBA1C 6.5 (A) 04/02/2019   HGBA1C 6.8 02/28/2017   HGBA1C 8.6 12/05/2016     Lab Results  Component Value Date   ALBUMIN 4.3 01/06/2017   ALBUMIN 3.4 (L) 02/15/2015    Body mass index is 25.84 kg/m.  Orders:  No  orders of the defined types were placed in this encounter.  No orders of the defined types were placed in this encounter.    Procedures: No procedures performed  Clinical Data: No additional findings.  ROS:  All other systems negative, except as noted in the HPI. Review of Systems  Objective: Vital Signs: Ht 5\' 7"  (1.702 m)   Wt 165 lb (74.8 kg)   BMI 25.84 kg/m   Specialty Comments:  No specialty comments available.  PMFS History: Patient Active Problem List   Diagnosis Date Noted  . Gangrene of toe of left foot (HCC)   . Critical lower limb ischemia 03/30/2019  . Ischemic cardiomyopathy 03/24/2019  . Critical limb ischemia with history of revascularization of same extremity 03/24/2019  . Type 2 diabetes mellitus (HCC) 02/28/2017  . 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 Essential hypertension 04/20/2015  . Hyperlipidemia 04/20/2015  . Diabetes (HCC) 04/20/2015  . 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 NSTEMI (non-ST elevated myocardial infarction) (HCC) 02/12/2015   Past Medical History:  Diagnosis Date  . CAD (coronary artery disease)   . Essential hypertension   . Heart murmur    when I was a teenager- no mention of it now  . Hyperlipidemia   . Type 2 diabetes mellitus (  [place])    Type II    Family History  Problem Relation Age of Onset  . Coronary artery disease Father        had CABG  . Heart disease Father     Past Surgical History:  Procedure Laterality Date  . AMPUTATION Left 04/08/2019   Procedure: LEFT FOOT 4TH TOE AMPUTATION;  Surgeon: Newt Minion, MD;  Location: [place];  Service: Orthopedics;  Laterality: Left;  . CORONARY ANGIOPLASTY WITH STENT PLACEMENT  2013  . CORONARY ARTERY BYPASS GRAFT N/A 02/16/2015   Procedure: CORONARY ARTERY BYPASS GRAFTING (CABG) x5 using left internal mammary artery, right thigh greater saphenous vein, and left radial artery. ;  Surgeon: Melrose Nakayama, MD;  Location: [place];  Service: Open Heart  Surgery;  Laterality: N/A;  . LEFT HEART CATHETERIZATION WITH CORONARY ANGIOGRAM N/A 02/14/2015   Procedure: LEFT HEART CATHETERIZATION WITH CORONARY ANGIOGRAM;  Surgeon: Lorretta Harp, MD;  Location: [facility] CATH LAB;  Service: Cardiovascular;  Laterality: N/A;  . LOWER EXTREMITY ANGIOGRAPHY N/A 03/30/2019   Procedure: LOWER EXTREMITY ANGIOGRAPHY;  Surgeon: Lorretta Harp, MD;  Location: [place] CV LAB;  Service: Cardiovascular;  Laterality: N/A;  . RADIAL ARTERY HARVEST Left 02/16/2015   Procedure: RADIAL ARTERY HARVEST;  Surgeon: Melrose Nakayama, MD;  Location: [place];  Service: Open Heart Surgery;  Laterality: Left;  . TEE WITHOUT CARDIOVERSION N/A 02/16/2015   Procedure: TRANSESOPHAGEAL ECHOCARDIOGRAM (TEE);  Surgeon: Melrose Nakayama, MD;  Location: [place];  Service: Open Heart Surgery;  Laterality: N/A;   Social History   Occupational History  . Not on file  Tobacco Use  . Smoking status: Former Smoker    Years: 30.00    Quit date: 02/16/2015    Years since quitting: 4.1  . Smokeless tobacco: Never Used  Substance and Sexual Activity  . Alcohol use: No    Alcohol/week: 0.0 standard drinks  . Drug use: No  . Sexual activity: Not on file

## 2019-04-22 ENCOUNTER — Ambulatory Visit: Payer: Self-pay

## 2019-04-22 ENCOUNTER — Ambulatory Visit: Payer: Self-pay | Attending: Family Medicine | Admitting: Physician Assistant

## 2019-04-22 ENCOUNTER — Other Ambulatory Visit: Payer: Self-pay

## 2019-04-22 DIAGNOSIS — I739 Peripheral vascular disease, unspecified: Secondary | ICD-10-CM

## 2019-04-22 DIAGNOSIS — E119 Type 2 diabetes mellitus without complications: Secondary | ICD-10-CM

## 2019-04-22 DIAGNOSIS — R300 Dysuria: Secondary | ICD-10-CM

## 2019-04-22 DIAGNOSIS — Z789 Other specified health status: Secondary | ICD-10-CM

## 2019-04-22 DIAGNOSIS — Z89422 Acquired absence of other left toe(s): Secondary | ICD-10-CM

## 2019-04-22 DIAGNOSIS — Z09 Encounter for follow-up examination after completed treatment for conditions other than malignant neoplasm: Secondary | ICD-10-CM

## 2019-04-22 LAB — POCT URINALYSIS DIP (CLINITEK)
Bilirubin, UA: NEGATIVE
Blood, UA: NEGATIVE
Glucose, UA: 100 mg/dL — AB
Ketones, POC UA: NEGATIVE mg/dL
Leukocytes, UA: NEGATIVE
Nitrite, UA: NEGATIVE
POC PROTEIN,UA: NEGATIVE
Spec Grav, UA: 1.025 (ref 1.010–1.025)
Urobilinogen, UA: 0.2 E.U./dL
pH, UA: 5.5 (ref 5.0–8.0)

## 2019-04-22 MED ORDER — CIPROFLOXACIN HCL 500 MG PO TABS: 500.0000 mg | ORAL_TABLET | Freq: Two times a day (BID) | ORAL | 0 refills | Status: DC

## 2019-04-22 NOTE — Progress Notes (Signed)
Virtual Visit via Telephone Note  I connected with Isaac Ray on 04/22/19 at 10:10 AM EDT by telephone and verified that I am speaking with the correct person using two identifiers.   I discussed the limitations, risks, security and privacy concerns of performing an evaluation and management service by telephone and the availability of in person appointments. I also discussed with the patient that there may be a patient responsible charge related to this service. The patient expressed understanding and agreed to proceed.  Patient location:  home My Location:  [place] office Persons on the call: myself, the interpreter(Elena), and the patient.     History of Present Illness:  3 day h/o painful urination/burning when he urinates.  He thinks he may have had a catheter in place at some point with recent surgery but isn't sure.  No abdominal pain.  No fever.  Appetite is good.  Denies penile discharge.    Blood sugars running in low to mid 100s  Elana with pacific interpreters   Ortho note 04/20/2019.   The patient is a 48 yo gentleman who is seen for post operative follow up following a left foot fourth toe amputation for gangrene on 04/08/2019.  12 days post op: He has been non weight bearing with crutches and reports no pain over the area. He is washing daily with soap and water and applying a dry dressing to the operative site.   From A/P: Assessment & Plan: Visit Diagnoses:  1. Status post amputation of lesser toe of left foot ([place])   2. Type 2 diabetes mellitus with diabetic polyneuropathy, without long-term current use of insulin ([place])   3. PAOD (peripheral arterial occlusive disease) ([place])     Plan: Continue daily washing and dry dressings. Continue non weight bearing and crutches. Follow up in 1 week for suture removal.     Observations/Objective:  A&O x 3   Assessment and Plan: 1. Dysuria He will come do a sample today.  Start coverage- - POCT URINALYSIS DIP  (CLINITEK) - Urine cytology ancillary only - Urine Culture - ciprofloxacin (CIPRO) 500 MG tablet; Take 1 tablet (500 mg total) by mouth 2 (two) times daily.  Dispense: 10 tablet; Refill: 0  2. Type 2 diabetes mellitus without complication, without long-term current use of insulin (HCC) Continue current regimen.    3. Language barrier pacific interpreters used and additional time performing visit was required.   4. Encounter for examination following treatment at hospital Doing well overall    Follow Up Instructions: Assign new PCP 1 month   I discussed the assessment and treatment plan with the patient. The patient was provided an opportunity to ask questions and all were answered. The patient agreed with the plan and demonstrated an understanding of the instructions.   The patient was advised to call back or seek an in-person evaluation if the symptoms worsen or if the condition fails to improve as anticipated.  I provided 12 minutes of non-face-to-face time during this encounter.   Freeman Caldron, PA-C  Patient ID: Isaac Ray, male   DOB: July 19, 1971, 48 y.o.   MRN: [id number]

## 2019-04-22 NOTE — Progress Notes (Signed)
Dysuria, burns with urination, 3 days

## 2019-04-23 LAB — URINE CYTOLOGY ANCILLARY ONLY
Chlamydia: NEGATIVE
Neisseria Gonorrhea: NEGATIVE
Trichomonas: NEGATIVE

## 2019-04-24 LAB — URINE CULTURE: Organism ID, Bacteria: NO GROWTH

## 2019-04-25 ENCOUNTER — Emergency Department (HOSPITAL_COMMUNITY): Payer: Self-pay

## 2019-04-25 ENCOUNTER — Other Ambulatory Visit: Payer: Self-pay

## 2019-04-25 ENCOUNTER — Emergency Department (HOSPITAL_COMMUNITY): Payer: Self-pay | Admitting: Certified Registered Nurse Anesthetist

## 2019-04-25 ENCOUNTER — Inpatient Hospital Stay (HOSPITAL_COMMUNITY)
Admission: EM | Admit: 2019-04-25 | Discharge: 2019-04-27 | DRG: 342 | Disposition: A | Discharge status: Home / Self Care | Payer: Self-pay | Attending: Physician Assistant | Admitting: Physician Assistant

## 2019-04-25 ENCOUNTER — Encounter (HOSPITAL_COMMUNITY): Payer: Self-pay

## 2019-04-25 ENCOUNTER — Encounter (HOSPITAL_COMMUNITY): Admission: EM | Disposition: A | Discharge status: Home / Self Care | Payer: Self-pay | Source: Home / Self Care

## 2019-04-25 DIAGNOSIS — Z951 Presence of aortocoronary bypass graft: Secondary | ICD-10-CM

## 2019-04-25 DIAGNOSIS — R058 Other specified cough: Secondary | ICD-10-CM

## 2019-04-25 DIAGNOSIS — Z79899 Other long term (current) drug therapy: Secondary | ICD-10-CM

## 2019-04-25 DIAGNOSIS — Z89422 Acquired absence of other left toe(s): Secondary | ICD-10-CM

## 2019-04-25 DIAGNOSIS — I513 Intracardiac thrombosis, not elsewhere classified: Secondary | ICD-10-CM | POA: Diagnosis present

## 2019-04-25 DIAGNOSIS — I10 Essential (primary) hypertension: Secondary | ICD-10-CM

## 2019-04-25 DIAGNOSIS — Z8249 Family history of ischemic heart disease and other diseases of the circulatory system: Secondary | ICD-10-CM

## 2019-04-25 DIAGNOSIS — I5022 Chronic systolic (congestive) heart failure: Secondary | ICD-10-CM | POA: Diagnosis present

## 2019-04-25 DIAGNOSIS — I5023 Acute on chronic systolic (congestive) heart failure: Secondary | ICD-10-CM

## 2019-04-25 DIAGNOSIS — I25708 Atherosclerosis of coronary artery bypass graft(s), unspecified, with other forms of angina pectoris: Secondary | ICD-10-CM

## 2019-04-25 DIAGNOSIS — I255 Ischemic cardiomyopathy: Secondary | ICD-10-CM | POA: Diagnosis present

## 2019-04-25 DIAGNOSIS — Z9049 Acquired absence of other specified parts of digestive tract: Secondary | ICD-10-CM

## 2019-04-25 DIAGNOSIS — K3580 Unspecified acute appendicitis: Secondary | ICD-10-CM | POA: Diagnosis present

## 2019-04-25 DIAGNOSIS — Z20828 Contact with and (suspected) exposure to other viral communicable diseases: Secondary | ICD-10-CM | POA: Diagnosis present

## 2019-04-25 DIAGNOSIS — R05 Cough: Secondary | ICD-10-CM | POA: Diagnosis present

## 2019-04-25 DIAGNOSIS — E782 Mixed hyperlipidemia: Secondary | ICD-10-CM

## 2019-04-25 DIAGNOSIS — K353 Acute appendicitis with localized peritonitis, without perforation or gangrene: Principal | ICD-10-CM | POA: Diagnosis present

## 2019-04-25 DIAGNOSIS — E1151 Type 2 diabetes mellitus with diabetic peripheral angiopathy without gangrene: Secondary | ICD-10-CM | POA: Diagnosis present

## 2019-04-25 DIAGNOSIS — Z7902 Long term (current) use of antithrombotics/antiplatelets: Secondary | ICD-10-CM

## 2019-04-25 DIAGNOSIS — I252 Old myocardial infarction: Secondary | ICD-10-CM

## 2019-04-25 DIAGNOSIS — Z7982 Long term (current) use of aspirin: Secondary | ICD-10-CM

## 2019-04-25 DIAGNOSIS — I70202 Unspecified atherosclerosis of native arteries of extremities, left leg: Secondary | ICD-10-CM | POA: Diagnosis present

## 2019-04-25 DIAGNOSIS — Z87891 Personal history of nicotine dependence: Secondary | ICD-10-CM

## 2019-04-25 DIAGNOSIS — Z01810 Encounter for preprocedural cardiovascular examination: Secondary | ICD-10-CM

## 2019-04-25 DIAGNOSIS — Z7984 Long term (current) use of oral hypoglycemic drugs: Secondary | ICD-10-CM

## 2019-04-25 DIAGNOSIS — I2510 Atherosclerotic heart disease of native coronary artery without angina pectoris: Secondary | ICD-10-CM | POA: Diagnosis present

## 2019-04-25 DIAGNOSIS — I236 Thrombosis of atrium, auricular appendage, and ventricle as current complications following acute myocardial infarction: Secondary | ICD-10-CM

## 2019-04-25 DIAGNOSIS — Z955 Presence of coronary angioplasty implant and graft: Secondary | ICD-10-CM

## 2019-04-25 DIAGNOSIS — I110 Hypertensive heart disease with heart failure: Secondary | ICD-10-CM | POA: Diagnosis present

## 2019-04-25 DIAGNOSIS — E785 Hyperlipidemia, unspecified: Secondary | ICD-10-CM | POA: Diagnosis present

## 2019-04-25 HISTORY — PX: LAPAROSCOPIC APPENDECTOMY: SHX408

## 2019-04-25 LAB — GLUCOSE, CAPILLARY
Glucose-Capillary: 162 mg/dL — ABNORMAL HIGH (ref 70–99)
Glucose-Capillary: 150 mg/dL — ABNORMAL HIGH (ref 70–99)

## 2019-04-25 LAB — COMPREHENSIVE METABOLIC PANEL
ALT: 24 U/L (ref 0–44)
AST: 20 U/L (ref 15–41)
Albumin: 3.9 g/dL (ref 3.5–5.0)
Alkaline Phosphatase: 49 U/L (ref 38–126)
Anion gap: 12 (ref 5–15)
BUN: 13 mg/dL (ref 6–20)
CO2: 22 mmol/L (ref 22–32)
Calcium: 9.4 mg/dL (ref 8.9–10.3)
Chloride: 105 mmol/L (ref 98–111)
Creatinine, Ser: 1.36 mg/dL — ABNORMAL HIGH (ref 0.61–1.24)
GFR calc Af Amer: >60 mL/min (ref >60)
GFR calc non Af Amer: >60 mL/min (ref >60)
Glucose, Bld: 177 mg/dL — ABNORMAL HIGH (ref 70–99)
Potassium: 3.8 mmol/L (ref 3.5–5.1)
Sodium: 139 mmol/L (ref 135–145)
Total Bilirubin: 0.6 mg/dL (ref 0.3–1.2)
Total Protein: 6.9 g/dL (ref 6.5–8.1)

## 2019-04-25 LAB — CBC
HCT: 40.8 % (ref 39.0–52.0)
Hemoglobin: 13.4 g/dL (ref 13.0–17.0)
MCH: 31.2 pg (ref 26.0–34.0)
MCHC: 32.8 g/dL (ref 30.0–36.0)
MCV: 94.9 fL (ref 80.0–100.0)
Platelets: 164 10*3/uL (ref 150–400)
RBC: 4.3 MIL/uL (ref 4.22–5.81)
RDW: 12.7 % (ref 11.5–15.5)
WBC: 11.8 10*3/uL — ABNORMAL HIGH (ref 4.0–10.5)
nRBC: 0 % (ref 0.0–0.2)

## 2019-04-25 LAB — ECHOCARDIOGRAM LIMITED
Height: 67 in
Weight: 2645.52 oz

## 2019-04-25 LAB — SARS CORONAVIRUS 2 BY RT PCR (HOSPITAL ORDER, PERFORMED IN ~~LOC~~ HOSPITAL LAB): SARS Coronavirus 2: NEGATIVE

## 2019-04-25 SURGERY — APPENDECTOMY, LAPAROSCOPIC
Anesthesia: General

## 2019-04-25 MED ORDER — PROPOFOL 10 MG/ML IV BOLUS
INTRAVENOUS | Status: DC | PRN
  Administered 2019-04-25: 50 mg via INTRAVENOUS

## 2019-04-25 MED ORDER — LACTATED RINGERS IV SOLN
INTRAVENOUS | Status: DC | PRN
  Administered 2019-04-25: 19:00:00 via INTRAVENOUS

## 2019-04-25 MED ORDER — HYDROMORPHONE HCL 1 MG/ML IJ SOLN: 0.2500 mg | INTRAMUSCULAR | Status: DC | PRN

## 2019-04-25 MED ORDER — ONDANSETRON HCL 4 MG/2ML IJ SOLN
INTRAMUSCULAR | Status: DC | PRN
  Administered 2019-04-25: 4 mg via INTRAVENOUS

## 2019-04-25 MED ORDER — MORPHINE SULFATE (PF) 4 MG/ML IV SOLN
4.0000 mg | Freq: Once | INTRAVENOUS | Status: AC
  Administered 2019-04-25: 4 mg via INTRAVENOUS
  Filled 2019-04-25: qty 1

## 2019-04-25 MED ORDER — PROMETHAZINE HCL 25 MG/ML IJ SOLN: 6.2500 mg | INTRAMUSCULAR | Status: DC | PRN

## 2019-04-25 MED ORDER — INSULIN ASPART 100 UNIT/ML ~~LOC~~ SOLN
0.0000 [IU] | Freq: Three times a day (TID) | SUBCUTANEOUS | Status: DC
  Administered 2019-04-26: 3 [IU] via SUBCUTANEOUS
  Administered 2019-04-26: 2 [IU] via SUBCUTANEOUS
  Administered 2019-04-27: 3 [IU] via SUBCUTANEOUS

## 2019-04-25 MED ORDER — OXYCODONE HCL 5 MG/5ML PO SOLN: 5.0000 mg | Freq: Once | ORAL | Status: DC | PRN

## 2019-04-25 MED ORDER — METOPROLOL TARTRATE 5 MG/5ML IV SOLN: 5.0000 mg | Freq: Four times a day (QID) | INTRAVENOUS | Status: DC | PRN

## 2019-04-25 MED ORDER — PERFLUTREN LIPID MICROSPHERE
1.0000 mL | INTRAVENOUS | Status: AC | PRN
  Administered 2019-04-25: 2 mL via INTRAVENOUS
  Filled 2019-04-25: qty 10

## 2019-04-25 MED ORDER — FENTANYL CITRATE (PF) 100 MCG/2ML IJ SOLN
INTRAMUSCULAR | Status: DC | PRN
  Administered 2019-04-25: 100 ug via INTRAVENOUS
  Administered 2019-04-25: 50 ug via INTRAVENOUS
  Administered 2019-04-25: 100 ug via INTRAVENOUS

## 2019-04-25 MED ORDER — DEXTROSE-NACL 5-0.45 % IV SOLN
INTRAVENOUS | Status: DC
  Administered 2019-04-25 – 2019-04-26 (×2): via INTRAVENOUS

## 2019-04-25 MED ORDER — SODIUM CHLORIDE 0.9 % IV BOLUS
1000.0000 mL | Freq: Once | INTRAVENOUS | Status: AC
  Administered 2019-04-25: 1000 mL via INTRAVENOUS

## 2019-04-25 MED ORDER — SUGAMMADEX SODIUM 200 MG/2ML IV SOLN
INTRAVENOUS | Status: DC | PRN
  Administered 2019-04-25: 200 mg via INTRAVENOUS

## 2019-04-25 MED ORDER — OXYCODONE HCL 5 MG PO TABS: 5.0000 mg | ORAL_TABLET | Freq: Once | ORAL | Status: DC | PRN

## 2019-04-25 MED ORDER — ACETAMINOPHEN 325 MG PO TABS
650.0000 mg | ORAL_TABLET | Freq: Four times a day (QID) | ORAL | Status: DC | PRN
  Administered 2019-04-26: 650 mg via ORAL
  Filled 2019-04-25: qty 2

## 2019-04-25 MED ORDER — MIDAZOLAM HCL 2 MG/2ML IJ SOLN
INTRAMUSCULAR | Status: AC
  Filled 2019-04-25: qty 2

## 2019-04-25 MED ORDER — ACETAMINOPHEN 10 MG/ML IV SOLN: 1000.0000 mg | Freq: Once | INTRAVENOUS | Status: DC | PRN

## 2019-04-25 MED ORDER — PIPERACILLIN-TAZOBACTAM 3.375 G IVPB 30 MIN
3.3750 g | Freq: Once | INTRAVENOUS | Status: AC
  Administered 2019-04-25: 3.375 g via INTRAVENOUS
  Filled 2019-04-25: qty 50

## 2019-04-25 MED ORDER — ONDANSETRON HCL 4 MG/2ML IJ SOLN
4.0000 mg | Freq: Once | INTRAMUSCULAR | Status: AC
  Administered 2019-04-25: 4 mg via INTRAVENOUS
  Filled 2019-04-25: qty 2

## 2019-04-25 MED ORDER — LIDOCAINE 2% (20 MG/ML) 5 ML SYRINGE
INTRAMUSCULAR | Status: DC | PRN
  Administered 2019-04-25: 60 mg via INTRAVENOUS

## 2019-04-25 MED ORDER — MORPHINE SULFATE (PF) 2 MG/ML IV SOLN
2.0000 mg | INTRAVENOUS | Status: DC | PRN
  Administered 2019-04-26: 2 mg via INTRAVENOUS
  Filled 2019-04-25: qty 1

## 2019-04-25 MED ORDER — ENOXAPARIN SODIUM 40 MG/0.4ML ~~LOC~~ SOLN
40.0000 mg | SUBCUTANEOUS | Status: AC
  Administered 2019-04-25: 40 mg via SUBCUTANEOUS
  Filled 2019-04-25: qty 0.4

## 2019-04-25 MED ORDER — BUPIVACAINE-EPINEPHRINE (PF) 0.25% -1:200000 IJ SOLN
INTRAMUSCULAR | Status: AC
  Filled 2019-04-25: qty 30

## 2019-04-25 MED ORDER — IOHEXOL 300 MG/ML  SOLN
100.0000 mL | Freq: Once | INTRAMUSCULAR | Status: AC | PRN
  Administered 2019-04-25: 100 mL via INTRAVENOUS

## 2019-04-25 MED ORDER — SODIUM CHLORIDE 0.9 % IV SOLN
INTRAVENOUS | Status: DC | PRN
  Administered 2019-04-25: 30 ug/min via INTRAVENOUS

## 2019-04-25 MED ORDER — OXYCODONE HCL 5 MG PO TABS
5.0000 mg | ORAL_TABLET | ORAL | Status: DC | PRN
  Administered 2019-04-25 – 2019-04-26 (×3): 5 mg via ORAL
  Filled 2019-04-25 (×3): qty 1

## 2019-04-25 MED ORDER — ONDANSETRON 4 MG PO TBDP: 4.0000 mg | ORAL_TABLET | Freq: Four times a day (QID) | ORAL | Status: DC | PRN

## 2019-04-25 MED ORDER — SUCCINYLCHOLINE CHLORIDE 200 MG/10ML IV SOSY
PREFILLED_SYRINGE | INTRAVENOUS | Status: DC | PRN
  Administered 2019-04-25: 80 mg via INTRAVENOUS

## 2019-04-25 MED ORDER — ACETAMINOPHEN 650 MG RE SUPP: 650.0000 mg | Freq: Four times a day (QID) | RECTAL | Status: DC | PRN

## 2019-04-25 MED ORDER — ARTIFICIAL TEARS OPHTHALMIC OINT
TOPICAL_OINTMENT | OPHTHALMIC | Status: AC
  Filled 2019-04-25: qty 3.5

## 2019-04-25 MED ORDER — ENOXAPARIN SODIUM 40 MG/0.4ML ~~LOC~~ SOLN
40.0000 mg | SUBCUTANEOUS | Status: DC
  Administered 2019-04-26 – 2019-04-27 (×2): 40 mg via SUBCUTANEOUS
  Filled 2019-04-25 (×2): qty 0.4

## 2019-04-25 MED ORDER — ROCURONIUM BROMIDE 50 MG/5ML IV SOSY
PREFILLED_SYRINGE | INTRAVENOUS | Status: DC | PRN
  Administered 2019-04-25: 40 mg via INTRAVENOUS

## 2019-04-25 MED ORDER — PROPOFOL 10 MG/ML IV BOLUS
INTRAVENOUS | Status: AC
  Filled 2019-04-25: qty 20

## 2019-04-25 MED ORDER — ONDANSETRON HCL 4 MG/2ML IJ SOLN: 4.0000 mg | Freq: Four times a day (QID) | INTRAMUSCULAR | Status: DC | PRN

## 2019-04-25 MED ORDER — FENTANYL CITRATE (PF) 100 MCG/2ML IJ SOLN: INTRAMUSCULAR | Status: DC | PRN

## 2019-04-25 MED ORDER — ETOMIDATE 2 MG/ML IV SOLN
INTRAVENOUS | Status: DC | PRN
  Administered 2019-04-25: 12 mg via INTRAVENOUS

## 2019-04-25 MED ORDER — SODIUM CHLORIDE 0.9 % IV SOLN
INTRAVENOUS | Status: DC | PRN
  Administered 2019-04-25: 20:00:00 via INTRAVENOUS

## 2019-04-25 MED ORDER — BUPIVACAINE-EPINEPHRINE 0.25% -1:200000 IJ SOLN
INTRAMUSCULAR | Status: DC | PRN
  Administered 2019-04-25: 30 mL

## 2019-04-25 MED ORDER — FENTANYL CITRATE (PF) 250 MCG/5ML IJ SOLN
INTRAMUSCULAR | Status: AC
  Filled 2019-04-25: qty 5

## 2019-04-25 SURGICAL SUPPLY — 36 items
CANISTER SUCT 3000ML PPV (MISCELLANEOUS) ×3 IMPLANT
CHLORAPREP W/TINT 26 (MISCELLANEOUS) ×3 IMPLANT
CLIP VESOLOCK XL 6/CT (CLIP) ×3 IMPLANT
COVER SURGICAL LIGHT HANDLE (MISCELLANEOUS) ×3 IMPLANT
DERMABOND ADVANCED (GAUZE/BANDAGES/DRESSINGS) ×2
DERMABOND ADVANCED .7 DNX12 (GAUZE/BANDAGES/DRESSINGS) ×1 IMPLANT
ELECT REM PT RETURN 9FT ADLT (ELECTROSURGICAL) ×3
ELECTRODE REM PT RTRN 9FT ADLT (ELECTROSURGICAL) ×1 IMPLANT
GLOVE BIOGEL PI IND STRL 7.0 (GLOVE) ×1 IMPLANT
GLOVE BIOGEL PI IND STRL 8 (GLOVE) ×1 IMPLANT
GLOVE BIOGEL PI INDICATOR 7.0 (GLOVE) ×2
GLOVE BIOGEL PI INDICATOR 8 (GLOVE) ×2
GLOVE SURG SS PI 7.0 STRL IVOR (GLOVE) ×3 IMPLANT
GOWN STRL REUS W/ TWL LRG LVL3 (GOWN DISPOSABLE) ×3 IMPLANT
GOWN STRL REUS W/TWL LRG LVL3 (GOWN DISPOSABLE) ×6
GRASPER SUT TROCAR 14GX15 (MISCELLANEOUS) ×3 IMPLANT
KIT BASIN OR (CUSTOM PROCEDURE TRAY) ×3 IMPLANT
KIT TURNOVER KIT B (KITS) ×3 IMPLANT
NEEDLE 22X1 1/2 (OR ONLY) (NEEDLE) ×3 IMPLANT
NS IRRIG 1000ML POUR BTL (IV SOLUTION) ×3 IMPLANT
PAD ARMBOARD 7.5X6 YLW CONV (MISCELLANEOUS) ×6 IMPLANT
POUCH RETRIEVAL ECOSAC 10 (ENDOMECHANICALS) ×1 IMPLANT
POUCH RETRIEVAL ECOSAC 10MM (ENDOMECHANICALS) ×2
SCISSORS LAP 5X35 DISP (ENDOMECHANICALS) ×3 IMPLANT
SET IRRIG TUBING LAPAROSCOPIC (IRRIGATION / IRRIGATOR) ×3 IMPLANT
SET TUBE SMOKE EVAC HIGH FLOW (TUBING) ×3 IMPLANT
SLEEVE ENDOPATH XCEL 5M (ENDOMECHANICALS) ×3 IMPLANT
SPECIMEN JAR SMALL (MISCELLANEOUS) ×3 IMPLANT
SUT MNCRL AB 4-0 PS2 18 (SUTURE) ×3 IMPLANT
TOWEL GREEN STERILE FF (TOWEL DISPOSABLE) ×3 IMPLANT
TOWEL OR 17X26 10 PK STRL BLUE (TOWEL DISPOSABLE) ×3 IMPLANT
TRAY FOL W/BAG SLVR 16FR STRL (SET/KITS/TRAYS/PACK) ×1 IMPLANT
TRAY FOLEY W/BAG SLVR 16FR LF (SET/KITS/TRAYS/PACK) ×2
TRAY LAPAROSCOPIC MC (CUSTOM PROCEDURE TRAY) ×3 IMPLANT
TROCAR XCEL NON-BLD 11X100MML (ENDOMECHANICALS) ×3 IMPLANT
TROCAR XCEL NON-BLD 5MMX100MML (ENDOMECHANICALS) ×3 IMPLANT

## 2019-04-25 NOTE — Progress Notes (Signed)
Pt arrived to [id number]. Pt speaks Spanish so video interpreter used. Pt given CHG bath, assessments completed and telemetry applied. Pt incisions examined and are c/d/i. Pt denies any complaints of pain. Pt on 5 L O2 [place]. Sats are in the low 90s. Pt's daughter called and will call back after pt settled. Call light within reach and bed in lowest position. Will continue to monitor. Lajoyce Corners, RN

## 2019-04-25 NOTE — Progress Notes (Signed)
Received report from PACU on pt coming to [facility]. Lajoyce Corners, RN

## 2019-04-25 NOTE — ED Triage Notes (Signed)
Pt here for eval of RLQ abdominal pain. Denies  cough sob fever, diarrhea or emesis however pt actively coughing.

## 2019-04-25 NOTE — H&P (Signed)
Reason for Consult: abdominal pain Referring Physician: Tommie SamsKevin Steinl  Isaac Ray is an 48 y.o. male.  HPI: 48 yo male with 1 day of abdominal pain. Pain began in the center and now today has localized to right lower quadrant. He has had nausea and vomiting. He last ate breakfast this morning. He denies diarrhea or constipation. He has never had a pain like this.  Past Medical History:  Diagnosis Date  . CAD (coronary artery disease)   . Essential hypertension   . Heart murmur    when I was a teenager- no mention of it now  . Hyperlipidemia   . Type 2 diabetes mellitus (HCC)    Type II    Past Surgical History:  Procedure Laterality Date  . AMPUTATION Left 04/08/2019   Procedure: LEFT FOOT 4TH TOE AMPUTATION;  Surgeon: Nadara Mustarduda, Marcus V, MD;  [facility];  Service: Orthopedics;  Laterality: Left;  . CORONARY ANGIOPLASTY WITH STENT PLACEMENT  2013  . CORONARY ARTERY BYPASS GRAFT N/A 02/16/2015   Procedure: CORONARY ARTERY BYPASS GRAFTING (CABG) x5 using left internal mammary artery, right thigh greater saphenous vein, and left radial artery. ;  Surgeon: Loreli SlotSteven C Hendrickson, MD;  [facility];  Service: Open Heart Surgery;  Laterality: N/A;  . LEFT HEART CATHETERIZATION WITH CORONARY ANGIOGRAM N/A 02/14/2015   Procedure: LEFT HEART CATHETERIZATION WITH CORONARY ANGIOGRAM;  Surgeon: Runell GessJonathan J Berry, MD;  [facility];  Service: Cardiovascular;  Laterality: N/A;  . LOWER EXTREMITY ANGIOGRAPHY N/A 03/30/2019   Procedure: LOWER EXTREMITY ANGIOGRAPHY;  Surgeon: Runell GessBerry, Jonathan J, MD;  Location: [facility] INVASIVE CV LAB;  Service: Cardiovascular;  Laterality: N/A;  . RADIAL ARTERY HARVEST Left 02/16/2015   Procedure: RADIAL ARTERY HARVEST;  Surgeon: Loreli SlotSteven C Hendrickson, MD;  [facility];  Service: Open Heart Surgery;  Laterality: Left;  . TEE WITHOUT CARDIOVERSION N/A 02/16/2015   Procedure: TRANSESOPHAGEAL ECHOCARDIOGRAM (TEE);  Surgeon: Loreli SlotSteven C Hendrickson, MD;   [facility];  Service: Open Heart Surgery;  Laterality: N/A;    Family History  Problem Relation Age of Onset  . Coronary artery disease Father        had CABG  . Heart disease Father     Social History:  reports that he quit smoking about 4 years ago. He quit after 30.00 years of use. He has never used smokeless tobacco. He reports that he does not drink alcohol or use drugs.  Allergies: No Known Allergies  Medications: I have reviewed the patient's current medications.  Results for orders placed or performed during the hospital encounter of 04/25/19 (from the past 48 hour(s))  CBC     Status: Abnormal   Collection Time: 04/25/19 12:23 PM  Result Value Ref Range   WBC 11.8 (H) 4.0 - 10.5 K/uL   RBC 4.30 4.22 - 5.81 MIL/uL   Hemoglobin 13.4 13.0 - 17.0 g/dL   HCT 16.140.8 09.639.0 - 04.552.0 %   MCV 94.9 80.0 - 100.0 fL   MCH 31.2 26.0 - 34.0 pg   MCHC 32.8 30.0 - 36.0 g/dL   RDW 40.912.7 81.111.5 - 91.415.5 %   Platelets 164 150 - 400 K/uL   nRBC 0.0 0.0 - 0.2 %    Comment: Performed at [facility], [address] [place], [place] [address]  Comprehensive metabolic panel     Status: Abnormal   Collection Time: 04/25/19 12:23 PM  Result Value Ref Range   Sodium 139 135 - 145 mmol/L  Potassium 3.8 3.5 - 5.1 mmol/L   Chloride 105 98 - 111 mmol/L   CO2 22 22 - 32 mmol/L   Glucose, Bld 177 (H) 70 - 99 mg/dL   BUN 13 6 - 20 mg/dL   Creatinine, Ser 3.25 (H) 0.61 - 1.24 mg/dL   Calcium 9.4 8.9 - 49.8 mg/dL   Total Protein 6.9 6.5 - 8.1 g/dL   Albumin 3.9 3.5 - 5.0 g/dL   AST 20 15 - 41 U/L   ALT 24 0 - 44 U/L   Alkaline Phosphatase 49 38 - 126 U/L   Total Bilirubin 0.6 0.3 - 1.2 mg/dL   GFR calc non Af Amer >60 >60 mL/min   GFR calc Af Amer >60 >60 mL/min   Anion gap 12 5 - 15    Comment: Performed at [facility], [address], [place], [place] [address]  SARS Coronavirus 2 (CEPHEID- Performed in [facility] hospital lab), Hosp Order     Status: None   Collection Time:  04/25/19  2:10 PM   Specimen: Nasopharyngeal Swab  Result Value Ref Range   SARS Coronavirus 2 NEGATIVE NEGATIVE    Comment: (NOTE) If result is NEGATIVE SARS-CoV-2 target nucleic acids are NOT DETECTED. The SARS-CoV-2 RNA is generally detectable in upper and lower  respiratory specimens during the acute phase of infection. The lowest  concentration of SARS-CoV-2 viral copies this assay can detect is 250  copies / mL. A negative result does not preclude SARS-CoV-2 infection  and should not be used as the sole basis for treatment or other  patient management decisions.  A negative result may occur with  improper specimen collection / handling, submission of specimen other  than nasopharyngeal swab, presence of viral mutation(s) within the  areas targeted by this assay, and inadequate number of viral copies  (<250 copies / mL). A negative result must be combined with clinical  observations, patient history, and epidemiological information. If result is POSITIVE SARS-CoV-2 target nucleic acids are DETECTED. The SARS-CoV-2 RNA is generally detectable in upper and lower  respiratory specimens dur ing the acute phase of infection.  Positive  results are indicative of active infection with SARS-CoV-2.  Clinical  correlation with patient history and other diagnostic information is  necessary to determine patient infection status.  Positive results do  not rule out bacterial infection or co-infection with other viruses. If result is PRESUMPTIVE POSTIVE SARS-CoV-2 nucleic acids MAY BE PRESENT.   A presumptive positive result was obtained on the submitted specimen  and confirmed on repeat testing.  While 2019 novel coronavirus  (SARS-CoV-2) nucleic acids may be present in the submitted sample  additional confirmatory testing may be necessary for epidemiological  and / or clinical management purposes  to differentiate between  SARS-CoV-2 and other Sarbecovirus currently known to infect humans.   If clinically indicated additional testing with an alternate test  methodology [id number]) is advised. The SARS-CoV-2 RNA is generally  detectable in upper and lower respiratory sp ecimens during the acute  phase of infection. The expected result is Negative. Fact Sheet for Patients:  BoilerBrush.com.cy Fact Sheet for Healthcare Providers: https://pope.com/ This test is not yet approved or cleared by the [place] FDA and has been authorized for detection and/or diagnosis of SARS-CoV-2 by FDA under an Emergency Use Authorization (EUA).  This EUA will remain in effect (meaning this test can be used) for the duration of the COVID-19 declaration under Section 564(b)(1) of the Act, 21 U.S.C. section  360bbb-3(b)(1), unless the authorization is terminated or revoked sooner. Performed at [facility], [address] [place], [place] [address]     Ct Abdomen Pelvis W Contrast  Result Date: 04/25/2019 CLINICAL DATA:  48 year old male with abdominal pain and suspected appendicitis EXAM: CT ABDOMEN AND PELVIS WITH CONTRAST TECHNIQUE: Multidetector CT imaging of the abdomen and pelvis was performed using the standard protocol following bolus administration of intravenous contrast. CONTRAST:  100mL OMNIPAQUE IOHEXOL 300 MG/ML  SOLN COMPARISON:  01/06/2017 FINDINGS: Lower chest: Filling defect at the apex of the left ventricle. Evidence of subendothelial hypodensity/hypoenhancement, likely representing a prior MI. Hepatobiliary: Unremarkable liver.  Unremarkable gallbladder. Pancreas: Unremarkable Spleen: Unremarkable Adrenals/Urinary Tract: Unremarkable appearance the bilateral adrenal glands. Right kidney without hydronephrosis or nephrolithiasis. No perinephric stranding. Unremarkable course the right ureter. Left kidney with no hydronephrosis. No perinephric stranding. Density within the papillary region at the superior aspect of the left kidney  may represent nonobstructive nephrolithiasis versus early excretion of contrast. Unremarkable course of the left ureter. Urinary bladder unremarkable. Stomach/Bowel: Unremarkable stomach. Compared to the prior CT there has been resolution of the wall thickening in the antral region. Unremarkable small bowel. Inflammatory changes at the appendix which demonstrates hyperenhancement. No calcified fecalith. No focal gas or abscess. Colonic diverticula. No associated inflammatory changes. No large stool burden. Vascular/Lymphatic: Mild atherosclerosis of the abdominal aorta. Calcifications of the bilateral iliac system. Bilateral iliac arteries and the proximal femoral arteries are patent. No adenopathy. Reproductive: Unremarkable prostate. Other: Bilateral fat containing inguinal hernia. No ventral wall hernia. Musculoskeletal: No acute displaced fracture. Mild degenerative changes of the visualized thoracolumbar spine. IMPRESSION: Acute appendicitis, uncomplicated. **An incidental finding of potential clinical significance has been found. 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 These results were discussed by telephone at the time of interpretation on 04/25/2019 at 3:07 pm with Dr. Cathren LaineKEVIN STEINL. Aortic Atherosclerosis (ICD10-I70.0). Additional ancillary findings as above. Electronically Signed   By: Gilmer MorJaime  Wagner D.O.   On: 04/25/2019 15:08    Review of Systems  Constitutional: Negative for chills and fever.  HENT: Negative for hearing loss.   Eyes: Negative for blurred vision and double vision.  Respiratory: Negative for cough and hemoptysis.   Cardiovascular: Negative for chest pain and palpitations.  Gastrointestinal: Positive for abdominal pain, nausea and vomiting. Negative for diarrhea.  Genitourinary: Negative for  dysuria and urgency.  Musculoskeletal: Negative for myalgias and neck pain.  Skin: Negative for itching and rash.  Neurological: Negative for dizziness, tingling and headaches.  Endo/Heme/Allergies: Does not bruise/bleed easily.  Psychiatric/Behavioral: Negative for depression and suicidal ideas.   Blood pressure (!) 107/56, pulse (!) 57, temperature 98.3 F (36.8 C), temperature source Oral, resp. rate (!) 21, height 5\' 7"  (1.702 m), weight 75 kg, SpO2 93 %. Physical Exam  Vitals reviewed. Constitutional: He is oriented to person, place, and time. He appears well-developed and well-nourished.  HENT:  Head: Normocephalic and atraumatic.  Eyes: Pupils are equal, round, and reactive to light. Conjunctivae and EOM are normal.  Neck: Normal range of motion. Neck supple.  Cardiovascular: Normal rate and regular rhythm.  Respiratory: Effort normal and breath sounds normal.  GI: Soft. Bowel sounds are normal. He exhibits no distension. There is abdominal tenderness in the right lower quadrant and suprapubic area. There is guarding.  Musculoskeletal: Normal range of motion.  Neurological: He is alert and oriented to person, place, and time.  Skin: Skin is warm and dry.  Psychiatric: He has a normal mood and affect. His behavior is normal.    Assessment/Plan: 48 yo male with acute appendicitis and incidental finding of thrombus in heart -cardiology consulted and recommends surgery with anticoagulation -IV abx -OR for lap appendectomy -we discussed the details of the procedure; that it would be done under general anesthesia, that we would attempt to do the procedure laparoscopically. That the appendix would be isolated from the large and small intestine and then ligated and removed. We discussed the reason for this is to avoid rupture and infection and resolve the pains. We discussed risks of infection, abscess, injury to intestines or urinary structures, and need for open incision. He showed  good understanding and wanted to proceed.  Video interpreter was used for this encounter  Arta Bruce Graceyn Fodor 04/25/2019, 6:26 PM

## 2019-04-25 NOTE — Anesthesia Preprocedure Evaluation (Addendum)
Anesthesia Evaluation  Patient identified by MRN, date of birth, ID band Patient awake    Reviewed: Allergy & Precautions, NPO status , Patient's Chart, lab work & pertinent test results  Airway Mallampati: III  TM Distance: >3 FB Neck ROM: Full    Dental  (+) Missing   Pulmonary former smoker,    Pulmonary exam normal breath sounds clear to auscultation       Cardiovascular hypertension, Pt. on home beta blockers and Pt. on medications + CAD, + Past MI, + CABG, + Peripheral Vascular Disease and +CHF  Normal cardiovascular exam Rhythm:Regular Rate:Normal  ECG: SB, rate 50  ECHO:  1. The left ventricle has moderate-severely reduced systolic function, with an ejection fraction of 30-35%. The cavity size was severely dilated. Left ventricular diastolic Doppler parameters are consistent with impaired relaxation. Left ventricular  diffuse hypokinesis. Small LV apical thrombus could only be visualized on Definity images.  2. The right ventricle has normal systolc function. The cavity was normal. There is no increase in right ventricular wall thickness.  3. Left atrial size was mildly dilated.  4. The aortic valve is tricuspid No stenosis of the aortic valve.  5. The aortic root is normal in size and structure.  6. No evidence of mitral valve stenosis. Trivial mitral regurgitation.  7. The IVC was normal in size. No complete TR doppler jet so unable to estimate PA systolic pressure.  Left ventricular thrombus  Pre-op eval per Cardiology    Neuro/Psych negative neurological ROS  negative psych ROS   GI/Hepatic negative GI ROS, Neg liver ROS,   Endo/Other  diabetes, Oral Hypoglycemic Agents  Renal/GU negative Renal ROS     Musculoskeletal negative musculoskeletal ROS (+)   Abdominal   Peds  Hematology HLD   Anesthesia Other Findings   Reproductive/Obstetrics                            Anesthesia  Physical Anesthesia Plan  ASA: III and emergent  Anesthesia Plan: General   Post-op Pain Management:    Induction: Intravenous and Rapid sequence  PONV Risk Score and Plan: 3 and Ondansetron, Dexamethasone, Midazolam and Treatment may vary due to age or medical condition  Airway Management Planned: Oral ETT  Additional Equipment:   Intra-op Plan:   Post-operative Plan: Extubation in OR  Informed Consent: I have reviewed the patients History and Physical, chart, labs and discussed the procedure including the risks, benefits and alternatives for the proposed anesthesia with the patient or authorized representative who has indicated his/her understanding and acceptance.     Dental advisory given  Plan Discussed with: CRNA  Anesthesia Plan Comments: (Stratus video interpretation utalized)       Anesthesia Quick Evaluation

## 2019-04-25 NOTE — Progress Notes (Signed)
Paged MD Isaac Ray Pt sats 79% when he arrived to PACU.  Placed pt on [place] and sats remained in the mid 80's.  Pt is on 15L Simple Mask and sats are 92-93%.  No new orders at this time.  Will continue to monitor pt.

## 2019-04-25 NOTE — Anesthesia Pain Management Evaluation Note (Signed)
MD Kinsinger paged about pts O2 sats.  No new orders at this time except for pt to go to [facility].  Also spoke to MD about airborne precautions order and it can be discontinued because the pt is COVID-19 negative.  Will place a discontinue order.

## 2019-04-25 NOTE — Progress Notes (Signed)
  Echocardiogram 2D Echocardiogram has been performed.  Isaac Ray 04/25/2019, 4:48 PM

## 2019-04-25 NOTE — Progress Notes (Signed)
Report attempted x 1

## 2019-04-25 NOTE — Op Note (Signed)
Preoperative diagnosis: acute appendicitis with peritonitis  Postoperative diagnosis: Same   Procedure: laparoscopic appendectomy  Surgeon: Gurney Maxin, M.D.  Asst: none  Anesthesia: Gen.   Indications for procedure: Isaac Ray is a 48 y.o. male with symptoms of pain in right lower quadrant, nausea and vomiting consistent with acute appendicitis. Confirmed by CT scan and laboratory values.  Description of procedure: The patient was brought into the operative suite, placed supine. Anesthesia was administered with endotracheal tube. The patient's left arm was tucked. All pressure points were offloaded by foam padding. The patient was prepped and draped in the usual sterile fashion.  A transverse incision was made to the left of the umbilicus and a 40mm trocar was [place]. Pneumoperitoneum was applied with high flow low pressure.  2 65mm trocars were placed, one in the suprapubic space, one in the LLQ, the periumbilical incision was then up-sized and a 54mm trocar placed in that space. A transversus abdominal block was placed on the left and right sides. Next, the patient was placed in trendelenberg, rotated to the left. The omentum was retracted cephalad. The cecum and appendix were identified. The appendix was inflamed with no signs of perforation. The base of the appendix was dissected and a window through the mesoappendix was created with blunt dissection. Large Hem-o-lock clips were used to doubly ligate the base of the appendix and mesoappendix. The appendix was cut free with scissors.  The appendix was placed in a specimen bag. The pelvis and RLQ were irrigated. No purulence was seen in the pelvis. The appendix was removed via the umbilicus. 0 vicryl was used to close the fascial defect. Pneumoperitoneum was removed, all trocars were removed. All incisions were closed with 4-0 monocryl subcuticular stitch. The patient woke from anesthesia and was brought to PACU in stable  condition.  Findings: acute appendicitis  Specimen: appendix  Blood loss: 30 ml  Local anesthesia: 30 ml marcaine  Complications: none  Gurney Maxin, M.D. General, Bariatric, & Minimally Invasive Surgery [facility]

## 2019-04-25 NOTE — Anesthesia Procedure Notes (Signed)
Procedure Name: Intubation Date/Time: 04/25/2019 7:50 PM Performed by: Babs Bertin, CRNA Pre-anesthesia Checklist: Patient identified, Emergency Drugs available, Suction available and Patient being monitored Patient Re-evaluated:Patient Re-evaluated prior to induction Oxygen Delivery Method: Circle System Utilized Preoxygenation: Pre-oxygenation with 100% oxygen Induction Type: IV induction and Rapid sequence Laryngoscope Size: Mac and 3 Tube type: Oral Tube size: 7.5 mm Number of attempts: 1 Airway Equipment and Method: Stylet and Oral airway Placement Confirmation: ETT inserted through vocal cords under direct vision,  positive ETCO2 and breath sounds checked- equal and bilateral Secured at: 21 cm Tube secured with: Tape Dental Injury: Teeth and Oropharynx as per pre-operative assessment

## 2019-04-25 NOTE — Consult Note (Signed)
Cardiology Consultation:   Patient ID: KAYVON MO MRN: [id number]; DOB: 1970-11-17  Admit date: 04/25/2019 Date of Consult: 04/25/2019  Primary Care Provider: Alfonse Spruce, FNP Primary Cardiologist: Quay Burow, MD   Patient Profile:   Isaac Ray is a 48 y.o. male with a hx of CAD, ischemic cardiomyopathy who is being seen today for the evaluation of a possible left ventricular thrombus at the request of Dr. Lajean Saver, MD.  History of Present Illness:   Isaac Ray 48 year old male with a prior history of CAD status post stenting in 2013 at [facility] in [place], hypertension, diabetes hyperlipidemia and continued tobacco abuse. S/P NSTEMI in 2016, cath showed 3 VD with severe LV dysfunction, s/p CABG x5 (LIMA to the LAD, sequential vein to OM1 and 2 and sequential left radial to diagonal branch 1 and 2). He stopped smoking 2 years ago. He returned from [place] several months ago, in the emergency room for this 01/21/2019. He has been seeing Dr. Dellia Nims as well as the wound care center. He apparently had IM antibiotics in [place] for this prior to return to [place]. He had lower extremity arterial Doppler studies performed in our office 02/10/2019 revealing a right ABI 0.72 and a left ABI 0.64 left fourth toe pressure was abnormal. He does complain of claudication left greater than right for the last year.  He underwent peripheral angiography on 03/30/2019 by Dr. Gwenlyn Found revealing a high-grade fairly focal left common femoral artery stenosis with one-vessel runoff via the anterior tibial.  Status post directional atherectomy and drug-coated balloon angioplasty.  10 days later he underwent left fourth toe amputation by Dr. Sharol Given.  He is being followed by Dr. Gwenlyn Found, last seen on April 10, 2019.    He presented today acute right lower quadrant abdominal pain that started yesterday, pain is moderate dull constant and nonradiating, no  nausea vomiting or diarrhea.  He was diagnosed with acute appendicitis requiring surgery.  On abdominal CT there was incidental finding of left ventricular apical thrombus and cardiology was consulted.  The patient denies any recent chest pain shortness of breath no lower extremity edema orthopnea proximal nocturnal dyspnea.  He has been compliant with his medications.  Heart Pathway Score:     Past Medical History:  Diagnosis Date  . CAD (coronary artery disease)   . Essential hypertension   . Heart murmur    when I was a teenager- no mention of it now  . Hyperlipidemia   . Type 2 diabetes mellitus ([place])    Type II    Past Surgical History:  Procedure Laterality Date  . AMPUTATION Left 04/08/2019   Procedure: LEFT FOOT 4TH TOE AMPUTATION;  Surgeon: Newt Minion, MD;  Location: [facility];  Service: Orthopedics;  Laterality: Left;  . CORONARY ANGIOPLASTY WITH STENT PLACEMENT  2013  . CORONARY ARTERY BYPASS GRAFT N/A 02/16/2015   Procedure: CORONARY ARTERY BYPASS GRAFTING (CABG) x5 using left internal mammary artery, right thigh greater saphenous vein, and left radial artery. ;  Surgeon: Melrose Nakayama, MD;  Location: [facility];  Service: Open Heart Surgery;  Laterality: N/A;  . LEFT HEART CATHETERIZATION WITH CORONARY ANGIOGRAM N/A 02/14/2015   Procedure: LEFT HEART CATHETERIZATION WITH CORONARY ANGIOGRAM;  Surgeon: Lorretta Harp, MD;  Location[facility] CATH LAB;  Service: Cardiovascular;  Laterality: N/A;  . LOWER EXTREMITY ANGIOGRAPHY N/A 03/30/2019   Procedure: LOWER EXTREMITY ANGIOGRAPHY;  Surgeon: Lorretta Harp, MD;  Location: [facility] CV LAB;  Service: Cardiovascular;  Laterality: N/A;  . RADIAL ARTERY HARVEST Left 02/16/2015   Procedure: RADIAL ARTERY HARVEST;  Surgeon: Loreli SlotSteven C Hendrickson, MD;  [facility];  Service: Open Heart Surgery;  Laterality: Left;  . TEE WITHOUT CARDIOVERSION N/A 02/16/2015   Procedure: TRANSESOPHAGEAL ECHOCARDIOGRAM (TEE);  Surgeon: Loreli SlotSteven C  Hendrickson, MD;  [facility];  Service: Open Heart Surgery;  Laterality: N/A;     Inpatient Medications: Scheduled Meds:  Continuous Infusions: . piperacillin-tazobactam    . sodium chloride     PRN Meds: perflutren lipid microspheres (DEFINITY) IV suspension  Allergies:   No Known Allergies  Social History:   Social History   Socioeconomic History  . Marital status: Single    Spouse name: Not on file  . Number of children: Not on file  . Years of education: Not on file  . Highest education level: Not on file  Occupational History  . Not on file  Social Needs  . Financial resource strain: Not on file  . Food insecurity    Worry: Not on file    Inability: Not on file  . Transportation needs    Medical: Not on file    Non-medical: Not on file  Tobacco Use  . Smoking status: Former Smoker    Years: 30.00    Quit date: 02/16/2015    Years since quitting: 4.1  . Smokeless tobacco: Never Used  Substance and Sexual Activity  . Alcohol use: No    Alcohol/week: 0.0 standard drinks  . Drug use: No  . Sexual activity: Not on file  Lifestyle  . Physical activity    Days per week: Not on file    Minutes per session: Not on file  . Stress: Not on file  Relationships  . Social Musicianconnections    Talks on phone: Not on file    Gets together: Not on file    Attends religious service: Not on file    Active member of club or organization: Not on file    Attends meetings of clubs or organizations: Not on file    Relationship status: Not on file  . Intimate partner violence    Fear of current or ex partner: Not on file    Emotionally abused: Not on file    Physically abused: Not on file    Forced sexual activity: Not on file  Other Topics Concern  . Not on file  Social History Narrative  . Not on file    Family History:    Family History  Problem Relation Age of Onset  . Coronary artery disease Father        had CABG  . Heart disease Father     ROS:  Please see  the history of present illness.  All other ROS reviewed and negative.     Physical Exam/Data:   Vitals:   04/25/19 1300 04/25/19 1330 04/25/19 1400 04/25/19 1430  BP: (!) 106/50 (!) 109/55 (!) 114/54 (!) 107/56  Pulse: (!) 57 63 67 67  Resp: 16 15 20  (!) 21  Temp:      TempSrc:      SpO2: 100% 96% 97% 95%  Weight:      Height:       No intake or output data in the 24 hours ending 04/25/19 1651 Last 3 Weights 04/25/2019 04/20/2019 04/13/2019  Weight (lbs) 165 lb 5.5 oz 165 lb 165 lb  Weight (kg) 75 kg 74.844 kg 74.844 kg     Body  mass index is 25.9 kg/m.  General: Obese, well developed, in mild distress secondary to pain HEENT: normal Lymph: no adenopathy Neck: no JVD Endocrine:  No thryomegaly Vascular: No carotid bruits; FA pulses 2+ bilaterally without bruits  Cardiac:  normal S1, S2; RRR; no significant murmurs Lungs:  clear to auscultation bilaterally, no wheezing, rhonchi or rales  Abd: soft, nontender, no hepatomegaly  Ext: no edema Musculoskeletal:  No deformities, BUE and BLE strength normal and equal Skin: warm and dry  Neuro:  CNs 2-12 intact, no focal abnormalities noted Psych:  Normal affect   EKG:  The EKG was personally reviewed and demonstrates: No new EKG performed this admission  Telemetry:  Telemetry was personally reviewed and demonstrates: Sinus rhythm  Relevant CV Studies:  Echocardiogram 04/25/2019  1. The left ventricle has moderate-severely reduced systolic function, with an ejection fraction of 30-35%. The cavity size was severely dilated. Left ventricular diastolic Doppler parameters are consistent with impaired relaxation. Left ventricular  diffuse hypokinesis. Small LV apical thrombus could only be visualized on Definity images.  2. The right ventricle has normal systolc function. The cavity was normal. There is no increase in right ventricular wall thickness.  3. Left atrial size was mildly dilated.  4. The aortic valve is tricuspid No  stenosis of the aortic valve.  5. The aortic root is normal in size and structure.  6. No evidence of mitral valve stenosis. Trivial mitral regurgitation.  7. The IVC was normal in size. No complete TR doppler jet so unable to estimate PA systolic pressure.  Laboratory Data:  High Sensitivity Troponin:  No results for input(s): TROPONINIHS in the last 720 hours.   Cardiac EnzymesNo results for input(s): TROPONINI in the last 168 hours. No results for input(s): TROPIPOC in the last 168 hours.  Chemistry Recent Labs  Lab 04/25/19 1223  NA 139  K 3.8  CL 105  CO2 22  GLUCOSE 177*  BUN 13  CREATININE 1.36*  CALCIUM 9.4  GFRNONAA >60  GFRAA >60  ANIONGAP 12    Recent Labs  Lab 04/25/19 1223  PROT 6.9  ALBUMIN 3.9  AST 20  ALT 24  ALKPHOS 49  BILITOT 0.6   Hematology Recent Labs  Lab 04/25/19 1223  WBC 11.8*  RBC 4.30  HGB 13.4  HCT 40.8  MCV 94.9  MCH 31.2  MCHC 32.8  RDW 12.7  PLT 164   BNPNo results for input(s): BNP, PROBNP in the last 168 hours.  DDimer No results for input(s): DDIMER in the last 168 hours.   Radiology/Studies:  Ct Abdomen Pelvis W Contrast  Result Date: 04/25/2019 CLINICAL DATA:  48 year old male with abdominal pain and suspected appendicitis EXAM: CT ABDOMEN AND PELVIS WITH CONTRAST TECHNIQUE: Multidetector CT imaging of the abdomen and pelvis was performed using the standard protocol following bolus administration of intravenous contrast. CONTRAST:  100mL OMNIPAQUE IOHEXOL 300 MG/ML  SOLN COMPARISON:  01/06/2017 FINDINGS: Lower chest: Filling defect at the apex of the left ventricle. Evidence of subendothelial hypodensity/hypoenhancement, likely representing a prior MI. Hepatobiliary: Unremarkable liver.  Unremarkable gallbladder. Pancreas: Unremarkable Spleen: Unremarkable Adrenals/Urinary Tract: Unremarkable appearance the bilateral adrenal glands. Right kidney without hydronephrosis or nephrolithiasis. No perinephric stranding.  Unremarkable course the right ureter. Left kidney with no hydronephrosis. No perinephric stranding. Density within the papillary region at the superior aspect of the left kidney may represent nonobstructive nephrolithiasis versus early excretion of contrast. Unremarkable course of the left ureter. Urinary bladder unremarkable. Stomach/Bowel: Unremarkable stomach. Compared to the  prior CT there has been resolution of the wall thickening in the antral region. Unremarkable small bowel. Inflammatory changes at the appendix which demonstrates hyperenhancement. No calcified fecalith. No focal gas or abscess. Colonic diverticula. No associated inflammatory changes. No large stool burden. Vascular/Lymphatic: Mild atherosclerosis of the abdominal aorta. Calcifications of the bilateral iliac system. Bilateral iliac arteries and the proximal femoral arteries are patent. No adenopathy. Reproductive: Unremarkable prostate. Other: Bilateral fat containing inguinal hernia. No ventral wall hernia. Musculoskeletal: No acute displaced fracture. Mild degenerative changes of the visualized thoracolumbar spine. IMPRESSION: Acute appendicitis, uncomplicated. **An incidental finding of potential clinical significance has been found. 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 These results were discussed by telephone at the time of interpretation on 04/25/2019 at 3:07 pm with Dr. Cathren Laine. Aortic Atherosclerosis (ICD10-I70.0). Additional ancillary findings as above. Electronically Signed   By: Gilmer Mor D.O.   On: 04/25/2019 15:08    Assessment and Plan:   1.  Left ventricular thrombus -Found incidentally on abdominal CT, this was confirmed on echocardiogram with Definity echo contrast, thrombus is fairly small not significantly mobile  possibly chronic in the settings of regional wall motion abnormalities -We would recommend to proceed with acute surgery and start anticoagulation as soon as acceptable from surgical standpoint.  Recent studies show superiority of Coumadin over DOAC in therapy of LV thrombus.  2.  Ischemic cardiomyopathy, LVEF 30 to 35% -Patient appears euvolemic -Restart carvedilol and lisinopril post surgery  3.  CAD status post CABG -No recent chest pain -Restart atorvastatin carvedilol and lisinopril postsurgery, discontinue aspirin given full anticoagulation with Coumadin.  4.  Hyperlipidemia -Continue atorvastatin post surgery  5.  Hypertension -Well-controlled on current regimen  For questions or updates, please contact [facility] Please consult [email or web address] for contact info under   Signed, Tobias Alexander, MD  04/25/2019 4:51 PM

## 2019-04-25 NOTE — Transfer of Care (Signed)
Immediate Anesthesia Transfer of Care Note  Patient: Isaac Ray  Procedure(s) Performed: APPENDECTOMY LAPAROSCOPIC (N/A )  Patient Location: PACU  Anesthesia Type:General  Level of Consciousness: awake, alert  and patient cooperative  Airway & Oxygen Therapy: Patient Spontanous Breathing and Patient connected to nasal cannula oxygen  Post-op Assessment: Report given to RN and Post -op Vital signs reviewed and stable  Post vital signs: Reviewed and stable  Last Vitals:  Vitals Value Taken Time  BP    Temp    Pulse    Resp    SpO2      Last Pain:  Vitals:   04/25/19 1209  TempSrc:   PainSc: 10-Worst pain ever         Complications: No apparent anesthesia complications

## 2019-04-25 NOTE — ED Provider Notes (Signed)
[facility] Provider Note   CSN: [id number] Arrival date & time: 04/25/19  1152     History   Chief Complaint Chief Complaint  Patient presents with   Abdominal Pain    HPI Isaac Ray is a 48 y.o. male.     Patient c/o RLQ abd pain onset yesterday. Symptoms acute onset, moderate, dull, constant, non radiating, worse w palpation. Nausea. No vomiting or diarrhea. No scrotal or testicular pain. No back or flank pain. No fevers. +non prod cough. No known covid + exposure. Denies chest pain or sob.   The history is provided by the patient. A language interpreter was used.  Abdominal Pain Associated symptoms: cough   Associated symptoms: no chest pain, no diarrhea, no dysuria, no fever, no shortness of breath, no sore throat and no vomiting     Past Medical History:  Diagnosis Date   CAD (coronary artery disease)    Essential hypertension    Heart murmur    when I was a teenager- no mention of it now   Hyperlipidemia    Type 2 diabetes mellitus (HCC)    Type II    Patient Active Problem List   Diagnosis Date Noted   Gangrene of toe of left foot (HCC)    Critical lower limb ischemia 03/30/2019   Ischemic cardiomyopathy 03/24/2019   Critical limb ischemia with history of revascularization of same extremity 03/24/2019   Type 2 diabetes mellitus (HCC) 02/28/2017   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/08/2017   Essential hypertension 04/20/2015   Hyperlipidemia 04/20/2015   Diabetes (HCC) 04/20/2015   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 ;  Surgeon: Loreli SlotSteven C Hendrickson, MD;  [facility];  Service: Open Heart Surgery;  Laterality: N/A;   LEFT HEART CATHETERIZATION WITH CORONARY ANGIOGRAM N/A 02/14/2015   Procedure: LEFT HEART CATHETERIZATION WITH CORONARY ANGIOGRAM;  Surgeon: Runell GessJonathan J Berry, MD;  [facility];  Service: Cardiovascular;  Laterality: N/A;   LOWER EXTREMITY ANGIOGRAPHY N/A 03/30/2019   Procedure: LOWER EXTREMITY ANGIOGRAPHY;  Surgeon: Runell GessBerry, Jonathan J, MD;  Location: [facility] INVASIVE CV LAB;  Service: Cardiovascular;  Laterality: N/A;   RADIAL ARTERY HARVEST Left 02/16/2015   Procedure: RADIAL ARTERY HARVEST;  Surgeon: Loreli SlotSteven C Hendrickson, MD;  [facility];  Service: Open Heart Surgery;  Laterality: Left;   TEE WITHOUT CARDIOVERSION N/A 02/16/2015   Procedure: TRANSESOPHAGEAL ECHOCARDIOGRAM (TEE);  Surgeon: Loreli SlotSteven C Hendrickson, MD;  [facility];  Service: Open Heart Surgery;  Laterality: N/A;        Home Medications    Prior to Admission medications   Medication Sig Start Date End Date Taking? Authorizing Provider  acetaminophen (TYLENOL) 500 MG tablet Take 500 mg by mouth 2 (two) times a day.    [provider]  aspirin EC 81 MG tablet Take 1 tablet (81 mg total) by mouth daily. 03/24/19   Runell GessBerry, Jonathan J, MD  atorvastatin (LIPITOR) 80 MG tablet Take  1 tablet (80 mg total) by mouth daily. 03/27/19   Runell GessBerry, Jonathan J, MD  carvedilol (COREG) 3.125 MG tablet Take 1 tablet (3.125 mg total) by mouth 2 (two) times daily with a meal. 03/27/19   Runell GessBerry, Jonathan J, MD  ciprofloxacin (CIPRO) 500 MG tablet Take 1 tablet (500 mg total) by mouth 2 (two) times daily. 04/22/19   Anders SimmondsMcClung, Angela M, PA-C  clopidogrel (PLAVIX) 75 MG tablet Take 1 tablet (75 mg total) by mouth daily with breakfast. 03/31/19   Georgie ChardMcDaniel, Jill D, NP  glipiZIDE  (GLUCOTROL XL) 2.5 MG 24 hr tablet Take 1 tablet (2.5 mg total) by mouth daily with breakfast. 02/28/17   Lizbeth BarkHairston, Mandesia R, FNP  isosorbide mononitrate (IMDUR) 30 MG 24 hr tablet Take 1 tablet (30 mg total) by mouth daily. 03/27/19   Runell GessBerry, Jonathan J, MD  lisinopril (ZESTRIL) 10 MG tablet Take 1 tablet (10 mg total) by mouth daily. 03/27/19   Runell GessBerry, Jonathan J, MD  metFORMIN (GLUCOPHAGE) 1000 MG tablet Take 1 tablet (1,000 mg total) by mouth 2 (two) times daily with a meal. 04/02/19   Grayce SessionsEdwards, Michelle P, NP    Family History Family History  Problem Relation Age of Onset   Coronary artery disease Father        had CABG   Heart disease Father     Social History Social History   Tobacco Use   Smoking status: Former Smoker    Years: 30.00    Quit date: 02/16/2015    Years since quitting: 4.1   Smokeless tobacco: Never Used  Substance Use Topics   Alcohol use: No    Alcohol/week: 0.0 standard drinks   Drug use: No     Allergies   Patient has no known allergies.   Review of Systems Review of Systems  Constitutional: Negative for fever.  HENT: Negative for sore throat.   Eyes: Negative for redness.  Respiratory: Positive for cough. Negative for shortness of breath.   Cardiovascular: Negative for chest pain.  Gastrointestinal: Positive for abdominal pain. Negative for diarrhea and vomiting.  Genitourinary: Negative for dysuria and flank pain.  Musculoskeletal: Negative for back pain and neck pain.  Skin: Negative for rash.  Neurological: Negative for headaches.  Hematological: Does not bruise/bleed easily.  Psychiatric/Behavioral: Negative for confusion.     Physical Exam Updated Vital Signs BP (!) 97/48    Pulse (!) 47    Temp 98.3 F (36.8 C) (Oral)    Resp 14    Ht 1.702 m (5\' 7" )    Wt 75 kg    SpO2 97%    BMI 25.90 kg/m   Physical Exam Vitals signs and nursing note reviewed.  Constitutional:      Appearance: Normal appearance. He is well-developed.    HENT:     Head: Atraumatic.     Nose: Nose normal.     Mouth/Throat:     Mouth: Mucous membranes are moist.     Pharynx: Oropharynx is clear.  Eyes:     General: No scleral icterus.    Conjunctiva/sclera: Conjunctivae normal.  Neck:     Musculoskeletal: Normal range of motion and neck supple. No neck rigidity.     Trachea: No tracheal deviation.  Cardiovascular:     Rate and Rhythm: Normal rate and regular rhythm.     Pulses: Normal pulses.     Heart sounds: Normal heart sounds. No murmur. No friction rub. No gallop.   Pulmonary:     Effort: Pulmonary  effort is normal. No accessory muscle usage or respiratory distress.     Breath sounds: Normal breath sounds.  Abdominal:     General: Bowel sounds are normal. There is no distension.     Palpations: Abdomen is soft. There is no mass.     Tenderness: There is abdominal tenderness. There is no guarding or rebound.     Hernia: No hernia is present.     Comments: Moderate RLQ tenderness.   Genitourinary:    Comments: No cva tenderness. Musculoskeletal:        General: No swelling.  Skin:    General: Skin is warm and dry.     Findings: No rash.  Neurological:     Mental Status: He is alert.     Comments: Alert, speech clear.   Psychiatric:        Mood and Affect: Mood normal.      ED Treatments / Results  Labs (all labs ordered are listed, but only abnormal results are displayed) Results for orders placed or performed during the hospital encounter of 04/25/19  SARS Coronavirus 2 (CEPHEID- Performed in [facility] hospital lab), [facility] Order   Specimen: Nasopharyngeal Swab  Result Value Ref Range   SARS Coronavirus 2 NEGATIVE NEGATIVE  CBC  Result Value Ref Range   WBC 11.8 (H) 4.0 - 10.5 K/uL   RBC 4.30 4.22 - 5.81 MIL/uL   Hemoglobin 13.4 13.0 - 17.0 g/dL   HCT 00.7 12.1 - 97.5 %   MCV 94.9 80.0 - 100.0 fL   MCH 31.2 26.0 - 34.0 pg   MCHC 32.8 30.0 - 36.0 g/dL   RDW 88.3 25.4 - 98.2 %   Platelets 164 150 - 400  K/uL   nRBC 0.0 0.0 - 0.2 %  Comprehensive metabolic panel  Result Value Ref Range   Sodium 139 135 - 145 mmol/L   Potassium 3.8 3.5 - 5.1 mmol/L   Chloride 105 98 - 111 mmol/L   CO2 22 22 - 32 mmol/L   Glucose, Bld 177 (H) 70 - 99 mg/dL   BUN 13 6 - 20 mg/dL   Creatinine, Ser 6.41 (H) 0.61 - 1.24 mg/dL   Calcium 9.4 8.9 - 58.3 mg/dL   Total Protein 6.9 6.5 - 8.1 g/dL   Albumin 3.9 3.5 - 5.0 g/dL   AST 20 15 - 41 U/L   ALT 24 0 - 44 U/L   Alkaline Phosphatase 49 38 - 126 U/L   Total Bilirubin 0.6 0.3 - 1.2 mg/dL   GFR calc non Af Amer >60 >60 mL/min   GFR calc Af Amer >60 >60 mL/min   Anion gap 12 5 - 15   Ct Abdomen Pelvis W Contrast  Result Date: 04/25/2019 CLINICAL DATA:  47 year old male with abdominal pain and suspected appendicitis EXAM: CT ABDOMEN AND PELVIS WITH CONTRAST TECHNIQUE: Multidetector CT imaging of the abdomen and pelvis was performed using the standard protocol following bolus administration of intravenous contrast. CONTRAST:  OMNIPAQUE IOHEXOL 300 MG/ML  SOLN COMPARISON:  01/06/2017 FINDINGS: Lower chest: Filling defect at the apex of the left ventricle. Evidence of subendothelial hypodensity/hypoenhancement, likely representing a prior MI. Hepatobiliary: Unremarkable liver.  Unremarkable gallbladder. Pancreas: Unremarkable Spleen: Unremarkable Adrenals/Urinary Tract: Unremarkable appearance the bilateral adrenal glands. Right kidney without hydronephrosis or nephrolithiasis. No perinephric stranding. Unremarkable course the right ureter. Left kidney with no hydronephrosis. No perinephric stranding. Density within the papillary region at the superior aspect of the left kidney may represent nonobstructive nephrolithiasis versus  early excretion of contrast. Unremarkable course of the left ureter. Urinary bladder unremarkable. Stomach/Bowel: Unremarkable stomach. Compared to the prior CT there has been resolution of the wall thickening in the antral region.  Unremarkable small bowel. Inflammatory changes at the appendix which demonstrates hyperenhancement. No calcified fecalith. No focal gas or abscess. Colonic diverticula. No associated inflammatory changes. No large stool burden. Vascular/Lymphatic: Mild atherosclerosis of the abdominal aorta. Calcifications of the bilateral iliac system. Bilateral iliac arteries and the proximal femoral arteries are patent. No adenopathy. Reproductive: Unremarkable prostate. Other: Bilateral fat containing inguinal hernia. No ventral wall hernia. Musculoskeletal: No acute displaced fracture. Mild degenerative changes of the visualized thoracolumbar spine. IMPRESSION: Acute appendicitis, uncomplicated. **An incidental finding of potential clinical significance has been found. 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 These results were discussed by telephone at the time of interpretation on 04/25/2019 at 3:07 pm with Dr. Cathren Laine. Aortic Atherosclerosis (ICD10-I70.0). Additional ancillary findings as above. Electronically Signed   By: Gilmer Mor D.O.   On: 04/25/2019 15:08    EKG ED ECG REPORT   Date: 04/25/2019  Rate: 54  Rhythm: sinus bradycardia  QRS Axis: normal  Intervals: normal  ST/T Wave abnormalities: nonspecific ST/T changes  Conduction Disutrbances:nonspecific intraventricular conduction delay  Narrative Interpretation:   Old EKG Reviewed: changes noted  I have personally reviewed the EKG tracing   Procedures Procedures (including critical care time)  Medications Ordered in ED Medications  sodium chloride 0.9 % bolus 1,000 mL (has no administration in time range)  morphine 4 MG/ML injection 4 mg (has no administration in time range)  ondansetron (ZOFRAN) injection 4 mg (has no administration in time range)      Initial Impression / Assessment and Plan / ED Course  I have reviewed the triage vital signs and the nursing notes.  Pertinent labs & imaging results that were available during my care of the patient were reviewed by me and considered in my medical decision making (see chart for details).  Iv ns. Labs sent. Ct ordered.  Morphine iv. zofran iv. Iv ns bolus.  Reviewed nursing notes and prior charts for additional history.   Labs reviewed by me - wbc mildly elv.  Glucose elevated. Sl increased creatinine. Iv ns boluses.   Pain controlled w above meds.  CT reviewed by me - appendicitis. Also concern for apical thrombus.   Isaac Ray was evaluated in Emergency Department on 04/25/2019 for the symptoms described in the history of present illness. He was evaluated in the context of the global COVID-19 pandemic, which necessitated consideration that the patient might be at risk for infection with the SARS-CoV-2 virus that causes COVID-19. Institutional protocols and algorithms that pertain to the evaluation of patients at risk for COVID-19 are in a state of rapid change based on information released by regulatory bodies including the CDC and federal and state organizations. These policies and algorithms were followed during the patient's care in the ED.  ED covid test is negative.   Cardiology consulted re suspected apical thrombus, and ?anticoag, ?increased risk cva complicating pt need for OR re appendicitis. Pt does have hx MI, and on prior echo apical akinesis, low ef.  Discussed pt with cardiology on call  - she indicates they will do echo at bedside, and will consult on pt.   General surgery consulted  re appendicitis - they will see.   Discussed results via interpreter line.   Recheck pt, comfortable, nad.      Final Clinical Impressions(s) / ED Diagnoses   Final diagnoses:  None    ED Discharge Orders    None       Lajean Saver, MD 04/25/19 1540

## 2019-04-25 NOTE — Anesthesia Postprocedure Evaluation (Signed)
Anesthesia Post Note  Patient: Isaac Ray  Procedure(s) Performed: APPENDECTOMY LAPAROSCOPIC (N/A )     Patient location during evaluation: PACU Anesthesia Type: General Level of consciousness: awake and alert Pain management: pain level controlled Vital Signs Assessment: post-procedure vital signs reviewed and stable Respiratory status: spontaneous breathing, nonlabored ventilation, respiratory function stable and patient connected to nasal cannula oxygen Cardiovascular status: blood pressure returned to baseline and stable Postop Assessment: no apparent nausea or vomiting Anesthetic complications: no    Last Vitals:  Vitals:   04/25/19 2205 04/25/19 2331  BP: (!) 117/55 112/61  Pulse: 70 65  Resp: 16 20  Temp: 37.2 C 37.7 C  SpO2: 91% 97%    Last Pain:  Vitals:   04/25/19 2331  TempSrc: Oral  PainSc:                  Karyl Kinnier Ellender

## 2019-04-26 ENCOUNTER — Encounter (HOSPITAL_COMMUNITY): Payer: Self-pay | Admitting: General Surgery

## 2019-04-26 DIAGNOSIS — Z9049 Acquired absence of other specified parts of digestive tract: Secondary | ICD-10-CM

## 2019-04-26 LAB — CBC
HCT: 34.8 % — ABNORMAL LOW (ref 39.0–52.0)
Hemoglobin: 11.8 g/dL — ABNORMAL LOW (ref 13.0–17.0)
MCH: 31.6 pg (ref 26.0–34.0)
MCHC: 33.9 g/dL (ref 30.0–36.0)
MCV: 93.3 fL (ref 80.0–100.0)
Platelets: 126 10*3/uL — ABNORMAL LOW (ref 150–400)
RBC: 3.73 MIL/uL — ABNORMAL LOW (ref 4.22–5.81)
RDW: 12.9 % (ref 11.5–15.5)
WBC: 11.8 10*3/uL — ABNORMAL HIGH (ref 4.0–10.5)
nRBC: 0 % (ref 0.0–0.2)

## 2019-04-26 LAB — COMPREHENSIVE METABOLIC PANEL
ALT: 23 U/L (ref 0–44)
AST: 17 U/L (ref 15–41)
Albumin: 3 g/dL — ABNORMAL LOW (ref 3.5–5.0)
Alkaline Phosphatase: 42 U/L (ref 38–126)
Anion gap: 9 (ref 5–15)
BUN: 8 mg/dL (ref 6–20)
CO2: 21 mmol/L — ABNORMAL LOW (ref 22–32)
Calcium: 8.4 mg/dL — ABNORMAL LOW (ref 8.9–10.3)
Chloride: 106 mmol/L (ref 98–111)
Creatinine, Ser: 1.13 mg/dL (ref 0.61–1.24)
GFR calc Af Amer: >60 mL/min (ref >60)
GFR calc non Af Amer: >60 mL/min (ref >60)
Glucose, Bld: 174 mg/dL — ABNORMAL HIGH (ref 70–99)
Potassium: 3.6 mmol/L (ref 3.5–5.1)
Sodium: 136 mmol/L (ref 135–145)
Total Bilirubin: 0.7 mg/dL (ref 0.3–1.2)
Total Protein: 5.8 g/dL — ABNORMAL LOW (ref 6.5–8.1)

## 2019-04-26 LAB — HIV ANTIBODY (ROUTINE TESTING W REFLEX): HIV Screen 4th Generation wRfx: NONREACTIVE

## 2019-04-26 LAB — PROTIME-INR
INR: 1.4 — ABNORMAL HIGH (ref 0.8–1.2)
Prothrombin Time: 16.5 seconds — ABNORMAL HIGH (ref 11.4–15.2)

## 2019-04-26 LAB — GLUCOSE, CAPILLARY
Glucose-Capillary: 130 mg/dL — ABNORMAL HIGH (ref 70–99)
Glucose-Capillary: 153 mg/dL — ABNORMAL HIGH (ref 70–99)
Glucose-Capillary: 116 mg/dL — ABNORMAL HIGH (ref 70–99)
Glucose-Capillary: 145 mg/dL — ABNORMAL HIGH (ref 70–99)

## 2019-04-26 MED ORDER — CLOPIDOGREL BISULFATE 75 MG PO TABS
75.0000 mg | ORAL_TABLET | Freq: Every day | ORAL | Status: DC
  Administered 2019-04-27: 75 mg via ORAL
  Filled 2019-04-26: qty 1

## 2019-04-26 MED ORDER — CARVEDILOL 3.125 MG PO TABS
3.1250 mg | ORAL_TABLET | Freq: Two times a day (BID) | ORAL | Status: DC
  Administered 2019-04-26 – 2019-04-27 (×2): 3.125 mg via ORAL
  Filled 2019-04-26 (×2): qty 1

## 2019-04-26 MED ORDER — COUMADIN BOOK
Freq: Once | Status: AC
  Administered 2019-04-26: 14:00:00
  Filled 2019-04-26: qty 1

## 2019-04-26 MED ORDER — LISINOPRIL 10 MG PO TABS
10.0000 mg | ORAL_TABLET | Freq: Every day | ORAL | Status: DC
  Administered 2019-04-26 – 2019-04-27 (×2): 10 mg via ORAL
  Filled 2019-04-26 (×2): qty 1

## 2019-04-26 MED ORDER — WARFARIN SODIUM 7.5 MG PO TABS
7.5000 mg | ORAL_TABLET | Freq: Once | ORAL | Status: AC
  Administered 2019-04-26: 18:00:00 7.5 mg via ORAL
  Filled 2019-04-26: qty 1

## 2019-04-26 MED ORDER — WARFARIN - PHARMACIST DOSING INPATIENT: Freq: Every day | Status: DC

## 2019-04-26 MED ORDER — WARFARIN VIDEO
Freq: Once | Status: AC
  Administered 2019-04-26: 14:00:00

## 2019-04-26 NOTE — Progress Notes (Signed)
Pt O2 sats at 100 percent on 5 L [facility]. Pt dropped down to 3L O2. Will monitor sats at this level. Lajoyce Corners, RN

## 2019-04-26 NOTE — Progress Notes (Signed)
1 Day Post-Op  Subjective: Alert.  No fever.  Vital signs stable. Pain seems controlled.  Says he is hungry.  Labs this morning show creatinine 1.13.  Hemoglobin 11.8 following IV hydration..  Glucose 174.   Objective: Vital signs in last 24 hours: Temp:  [97.5 F (36.4 C)-99.9 F (37.7 C)] 98.6 F (37 C) (06/28 0404) Pulse Rate:  [47-94] 66 (06/28 0404) Resp:  [14-21] 19 (06/28 0404) BP: (97-153)/(44-83) 111/58 (06/28 0404) SpO2:  [85 %-100 %] 99 % (06/28 0404) Weight:  [75 kg] 75 kg (06/27 1209) Last BM Date: 04/25/19  Intake/Output from previous day: 06/27 0701 - 06/28 0700 In: 1335.4 [I.V.:1335.4] Out: 535 [Urine:525; Blood:10] Intake/Output this shift: No intake/output data recorded.  General appearance: Alert.  Minimal distress.  Friendly and cooperative Resp: clear to auscultation bilaterally GI: Soft.  Nondistended.  Appropriate incisional tenderness.  All wounds look good.  No bleeding  Lab Results:  Results for orders placed or performed during the hospital encounter of 04/25/19 (from the past 24 hour(s))  CBC     Status: Abnormal   Collection Time: 04/25/19 12:23 PM  Result Value Ref Range   WBC 11.8 (H) 4.0 - 10.5 K/uL   RBC 4.30 4.22 - 5.81 MIL/uL   Hemoglobin 13.4 13.0 - 17.0 g/dL   HCT 53.7 94.3 - 27.6 %   MCV 94.9 80.0 - 100.0 fL   MCH 31.2 26.0 - 34.0 pg   MCHC 32.8 30.0 - 36.0 g/dL   RDW 14.7 09.2 - 95.7 %   Platelets 164 150 - 400 K/uL   nRBC 0.0 0.0 - 0.2 %  Comprehensive metabolic panel     Status: Abnormal   Collection Time: 04/25/19 12:23 PM  Result Value Ref Range   Sodium 139 135 - 145 mmol/L   Potassium 3.8 3.5 - 5.1 mmol/L   Chloride 105 98 - 111 mmol/L   CO2 22 22 - 32 mmol/L   Glucose, Bld 177 (H) 70 - 99 mg/dL   BUN 13 6 - 20 mg/dL   Creatinine, Ser 4.73 (H) 0.61 - 1.24 mg/dL   Calcium 9.4 8.9 - 40.3 mg/dL   Total Protein 6.9 6.5 - 8.1 g/dL   Albumin 3.9 3.5 - 5.0 g/dL   AST 20 15 - 41 U/L   ALT 24 0 - 44 U/L   Alkaline  Phosphatase 49 38 - 126 U/L   Total Bilirubin 0.6 0.3 - 1.2 mg/dL   GFR calc non Af Amer >60 >60 mL/min   GFR calc Af Amer >60 >60 mL/min   Anion gap 12 5 - 15  SARS Coronavirus 2 (CEPHEID- Performed in [facility] lab), [facility] Order     Status: None   Collection Time: 04/25/19  2:10 PM   Specimen: Nasopharyngeal Swab  Result Value Ref Range   SARS Coronavirus 2 NEGATIVE NEGATIVE  Glucose, capillary     Status: Abnormal   Collection Time: 04/25/19  8:51 PM  Result Value Ref Range   Glucose-Capillary 162 (H) 70 - 99 mg/dL  Glucose, capillary     Status: Abnormal   Collection Time: 04/25/19 10:24 PM  Result Value Ref Range   Glucose-Capillary 150 (H) 70 - 99 mg/dL  Comprehensive metabolic panel     Status: Abnormal   Collection Time: 04/26/19  3:23 AM  Result Value Ref Range   Sodium 136 135 - 145 mmol/L   Potassium 3.6 3.5 - 5.1 mmol/L   Chloride 106 98 - 111 mmol/L  CO2 21 (L) 22 - 32 mmol/L   Glucose, Bld 174 (H) 70 - 99 mg/dL   BUN 8 6 - 20 mg/dL   Creatinine, Ser 1.13 0.61 - 1.24 mg/dL   Calcium 8.4 (L) 8.9 - 10.3 mg/dL   Total Protein 5.8 (L) 6.5 - 8.1 g/dL   Albumin 3.0 (L) 3.5 - 5.0 g/dL   AST 17 15 - 41 U/L   ALT 23 0 - 44 U/L   Alkaline Phosphatase 42 38 - 126 U/L   Total Bilirubin 0.7 0.3 - 1.2 mg/dL   GFR calc non Af Amer >60 >60 mL/min   GFR calc Af Amer >60 >60 mL/min   Anion gap 9 5 - 15  CBC     Status: Abnormal   Collection Time: 04/26/19  3:23 AM  Result Value Ref Range   WBC 11.8 (H) 4.0 - 10.5 K/uL   RBC 3.73 (L) 4.22 - 5.81 MIL/uL   Hemoglobin 11.8 (L) 13.0 - 17.0 g/dL   HCT 34.8 (L) 39.0 - 52.0 %   MCV 93.3 80.0 - 100.0 fL   MCH 31.6 26.0 - 34.0 pg   MCHC 33.9 30.0 - 36.0 g/dL   RDW 12.9 11.5 - 15.5 %   Platelets 126 (L) 150 - 400 K/uL   nRBC 0.0 0.0 - 0.2 %  Glucose, capillary     Status: Abnormal   Collection Time: 04/26/19  6:34 AM  Result Value Ref Range   Glucose-Capillary 130 (H) 70 - 99 mg/dL     Studies/Results: Ct Abdomen  Pelvis W Contrast  Result Date: 04/25/2019 CLINICAL DATA:  48 year old male with abdominal pain and suspected appendicitis EXAM: CT ABDOMEN AND PELVIS WITH CONTRAST TECHNIQUE: Multidetector CT imaging of the abdomen and pelvis was performed using the standard protocol following bolus administration of intravenous contrast. CONTRAST:  158mL OMNIPAQUE IOHEXOL 300 MG/ML  SOLN COMPARISON:  01/06/2017 FINDINGS: Lower chest: Filling defect at the apex of the left ventricle. Evidence of subendothelial hypodensity/hypoenhancement, likely representing a prior MI. Hepatobiliary: Unremarkable liver.  Unremarkable gallbladder. Pancreas: Unremarkable Spleen: Unremarkable Adrenals/Urinary Tract: Unremarkable appearance the bilateral adrenal glands. Right kidney without hydronephrosis or nephrolithiasis. No perinephric stranding. Unremarkable course the right ureter. Left kidney with no hydronephrosis. No perinephric stranding. Density within the papillary region at the superior aspect of the left kidney may represent nonobstructive nephrolithiasis versus early excretion of contrast. Unremarkable course of the left ureter. Urinary bladder unremarkable. Stomach/Bowel: Unremarkable stomach. Compared to the prior CT there has been resolution of the wall thickening in the antral region. Unremarkable small bowel. Inflammatory changes at the appendix which demonstrates hyperenhancement. No calcified fecalith. No focal gas or abscess. Colonic diverticula. No associated inflammatory changes. No large stool burden. Vascular/Lymphatic: Mild atherosclerosis of the abdominal aorta. Calcifications of the bilateral iliac system. Bilateral iliac arteries and the proximal femoral arteries are patent. No adenopathy. Reproductive: Unremarkable prostate. Other: Bilateral fat containing inguinal hernia. No ventral wall hernia. Musculoskeletal: No acute displaced fracture. Mild degenerative changes of the visualized thoracolumbar spine. IMPRESSION:  Acute appendicitis, uncomplicated. **An incidental finding of potential clinical significance has been found. 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 These results were discussed by telephone at the time of interpretation on 04/25/2019 at 3:07 pm with Dr. Lajean Saver. Aortic Atherosclerosis (ICD10-I70.0). Additional ancillary findings as above. Electronically Signed   By:  Gilmer MorJaime  Wagner D.O.   On: 04/25/2019 15:08    . enoxaparin (LOVENOX) injection  40 mg Subcutaneous Q24H  . insulin aspart  0-15 Units Subcutaneous TID WC     Assessment/Plan: s/p Procedure(s): APPENDECTOMY LAPAROSCOPIC  POD #1.  Laparoscopic appendectomy for uncomplicated appendicitis -Advance diet as tolerated -Ambulate -does not need further antibiotics -Should be stable and ready for discharge from surgical standpoint later today, however will need further evaluation and anticoagulation initiated by cardiology  Left ventricular thrombus. -Okay to start anticoagulation from surgical standpoint. -Defer selection of drug and dose to cardiology  Ischemic cardiomyopathy, LVEF 30 to 35% CAD status post CABG.  Stable Hyperlipidemia Hypertension   @PROBHOSP @  LOS: 1 day    Ernestene MentionHaywood M Maraya Gwilliam 04/26/2019  . .prob

## 2019-04-26 NOTE — Plan of Care (Signed)

## 2019-04-26 NOTE — Discharge Instructions (Addendum)
CIRUGIA LAPAROSCOPICA: INSTRUCCIONES DE POST OPERATORIO. ° °Revise siempre los documentos que le entreguen en el lugar donde se ha hecho la cirugia. ° °SI USTED NECESITA DOCUMENTOS DE INCAPACIDAD (DISABLE) O DE PERMISO FAMILAR (FAMILY LEAVE) NECESITA TRAERLOS A LA OFICINA PARA QUE SEAN PROCESADOS. °NO  SE LOS DE A SU DOCTOR. °1. A su alta del hospital se le dara una receta para controlar el dolor. Tomela como ha sido recetada, si la necesita. Si no la necesita puede tomar, Acetaminofen (Tylenol) o Ibuprofen (Advil) para aliviar dolor moderado. °2. Continue tomando el resto de sus medicinas. °3. Si necesita rellenar la receta, llame a la farmacia. ellos contactan a nuestra oficina pidiendo autorizacion. Este tipo de receta no pueden ser rellenadas despues de las  5pm o durante los fines de semana. °4. Con relacion a la dieta: debe ser ligera los primeros dias despues que llege a la casa. Ejemplo: sopas y galleticas. Tome bastante liquido esos dias. °5. La mayoria de los pacientes padecen de inflamacion y cambio de coloracion de la piel alrededor de las incisiones. esto toma dias en resolver.  pnerse una bolsa de hielo en el area affectada ayuda..  °6. Es comun tambien tener un poco de estrenimiento si esta tomado medicinas para el dolor. incremente la cantidad de liquidos a tomar y puede tomar (Colace) esto previene el problema. Si ya tiene estrenimiento, es decir no ha defecado en 48 horas, puede tomar un laxativo (Milk of Magnesia or Miralax) uselo como el paquete le explica. °7.  A menos que se le diga algo diferente. Remueva el bendaje a las 24-48 horas despues dela cirugia. y puede banarse en la ducha sin ningun problema. usted puede tener steri-strips (pequenas curitas transparentes en la piel puesta encima de la incision)  Estas banditas strips should be left on the skin for 7-10 days.   Si su cirujano puso pegamento encima de la incision usted puede banarse bajo la ducha en 24 horas. Este pegamento empezara a  caerse en las proximas 2-3 semanas. Si le pusieron suturas o presillas (grapos) estos seran quitados en su proxima cita en la oficina. . °a. ACTIVIDADES:  Puede hacer actividad ligera.  Como caminar , subir escaleras y poco a poco irlas incrementando tanto como las tolere. Puede tener relaciones sexuales cuando sea comfortable. No carge objetos pesados o haga esfuerzos que no sean aprovados por su doctor. °b. Puede manejar en cuanto no esta tomando medicamentos fuertes (narcoticos) para el dolor, pueda abrochar confortablemente el cinturon de seguridad, y pueda maniobrar y usar los pedales de su vehiculo con seguridad. °c. PUEDE REGRESAR A TRABAJAR  °8. Debe ver a su doctor para una cita de seguimiento en 2-3 semanas despues de la cirugia.  °9. OTRAS ISNSTRUCCIONES:___________________________________________________________________________________ °CUANDO LLAMAR A SU MEDICO: °1. FIEBRE mayor de  101.0 °2. No produccion de orina. °3. Sangramiento continue de la herida °4. Incremento de dolor, enrojecimientio o drenaje de la herida (incision) °5. Incremento de dolor abdominal. ° °The clinic staff is available to answer your questions during regular business hours.  Please don’t hesitate to call and ask to speak to one of the nurses for clinical concerns.  If you have a medical emergency, go to the nearest emergency room or call 911.  A surgeon from [facility] is always on call at the hospital. °[address], [address], [place], [place]  [address] ? [address] [address], [place], [place]   [address] °[phone] ? [phone] ? FAX [phone] °Web site: [email or web address] ° ° °

## 2019-04-26 NOTE — Progress Notes (Signed)
Dr Nelson's preop consult note reviewed. 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 Admitted for appendicitis, s/p surgery yesterday. No additional recs at this time beyond Dr Francesca Oman detailed consult note. Surgery is ok based on there note starting anticoag, we will start coumadin. No indication to bridge. Would also restart his home lisinopril and coreg. He had been on DAPT after recent leg intervention 03/30/19, would d/c aspirin since starting coumadin and treat with just coumadin and plavix for now. With surgery just last night, start plavix tomorrow. No additional recs at this time.    Zandra Abts MD

## 2019-04-26 NOTE — Progress Notes (Addendum)
ANTICOAGULATION CONSULT NOTE - Initial Consult  Pharmacy Consult for warfarin Indication: LV thrombus  No Known Allergies  Patient Measurements: Height: 5\' 7"  (170.2 cm) Weight: 165 lb 5.5 oz (75 kg) IBW/kg (Calculated) : 66.1  Vital Signs: Temp: 98.5 F (36.9 C) (06/28 0829) Temp Source: Oral (06/28 0829) BP: 114/59 (06/28 0829) Pulse Rate: 65 (06/28 0829)  Labs: Recent Labs    04/25/19 1223 04/26/19 0323  HGB 13.4 11.8*  HCT 40.8 34.8*  PLT 164 126*  CREATININE 1.36* 1.13    Estimated Creatinine Clearance: 73.9 mL/min (by C-G formula based on SCr of 1.13 mg/dL).   Medical History: Past Medical History:  Diagnosis Date  . CAD (coronary artery disease)   . Essential hypertension   . Heart murmur    when I was a teenager- no mention of it now  . Hyperlipidemia   . Type 2 diabetes mellitus (HCC)    Type II    Medications:  Medications Prior to Admission  Medication Sig Dispense Refill Last Dose  . aspirin EC 81 MG tablet Take 1 tablet (81 mg total) by mouth daily. 90 tablet 3 04/24/2019 at am  . atorvastatin (LIPITOR) 80 MG tablet Take 1 tablet (80 mg total) by mouth daily. 90 tablet 3 04/24/2019 at am  . carvedilol (COREG) 3.125 MG tablet Take 1 tablet (3.125 mg total) by mouth 2 (two) times daily with a meal. 180 tablet 3 04/24/2019 at 800  . ciprofloxacin (CIPRO) 500 MG tablet Take 1 tablet (500 mg total) by mouth 2 (two) times daily. 10 tablet 0 04/22/2019  . clopidogrel (PLAVIX) 75 MG tablet Take 1 tablet (75 mg total) by mouth daily with breakfast. 90 tablet 2 04/24/2019 at am  . glipiZIDE (GLUCOTROL XL) 2.5 MG 24 hr tablet Take 1 tablet (2.5 mg total) by mouth daily with breakfast. 90 tablet 2 04/24/2019 at am  . isosorbide mononitrate (IMDUR) 30 MG 24 hr tablet Take 1 tablet (30 mg total) by mouth daily. 90 tablet 3 04/24/2019 at am  . lisinopril (ZESTRIL) 10 MG tablet Take 1 tablet (10 mg total) by mouth daily. 90 tablet 3 04/24/2019 at am  . metFORMIN  (GLUCOPHAGE) 1000 MG tablet Take 1 tablet (1,000 mg total) by mouth 2 (two) times daily with a meal. 60 tablet 3 04/25/2019 at am  . oxyCODONE-acetaminophen (PERCOCET/ROXICET) 5-325 MG tablet Take 1 tablet by mouth every 4 (four) hours as needed for severe pain.   04/24/2019 at Unknown time    Assessment: 48 y/o male admitted for abdominal pain now s/p laporoscopic appendectomy on 6/27. Echo done pre-op showed small LV thrombus. Pharmacy consulted to begin warfarin. On aspirin and clopidogrel PTA with plan to stop aspirin while on warfarin. On enoxaparin for VTE prophylaxis. Baseline INR 1.4. No bleeding noted, ABLA noted.  Goal of Therapy:  INR 2-3 Monitor platelets by anticoagulation protocol: Yes   Plan:  Warfarin 7.5 mg PO tonight Discontinue enoxaparin when INR >=2 Daily INR Monitor for s/sx of bleeding Warfarin book and video ordered Education prior to d/c   Thank you for involving pharmacy in this patient's care.  Renold Genta, PharmD, BCPS Clinical Pharmacist Clinical phone for 04/26/2019 until 3p is [phone] 04/26/2019 10:17 AM  **Pharmacist phone directory can be found on [email or web address] listed under [place]**

## 2019-04-27 ENCOUNTER — Ambulatory Visit: Payer: Self-pay | Admitting: Orthopedic Surgery

## 2019-04-27 ENCOUNTER — Encounter (HOSPITAL_COMMUNITY): Payer: Self-pay | Admitting: Physician Assistant

## 2019-04-27 DIAGNOSIS — I513 Intracardiac thrombosis, not elsewhere classified: Secondary | ICD-10-CM | POA: Diagnosis present

## 2019-04-27 DIAGNOSIS — I240 Acute coronary thrombosis not resulting in myocardial infarction: Secondary | ICD-10-CM

## 2019-04-27 HISTORY — DX: Acute coronary thrombosis not resulting in myocardial infarction: I24.0

## 2019-04-27 LAB — PROTIME-INR
INR: 1.3 — ABNORMAL HIGH (ref 0.8–1.2)
Prothrombin Time: 15.6 seconds — ABNORMAL HIGH (ref 11.4–15.2)

## 2019-04-27 LAB — CBC
HCT: 38.9 % — ABNORMAL LOW (ref 39.0–52.0)
Hemoglobin: 12.9 g/dL — ABNORMAL LOW (ref 13.0–17.0)
MCH: 31.1 pg (ref 26.0–34.0)
MCHC: 33.2 g/dL (ref 30.0–36.0)
MCV: 93.7 fL (ref 80.0–100.0)
Platelets: 134 10*3/uL — ABNORMAL LOW (ref 150–400)
RBC: 4.15 MIL/uL — ABNORMAL LOW (ref 4.22–5.81)
RDW: 12.6 % (ref 11.5–15.5)
WBC: 11.9 10*3/uL — ABNORMAL HIGH (ref 4.0–10.5)
nRBC: 0 % (ref 0.0–0.2)

## 2019-04-27 LAB — COMPREHENSIVE METABOLIC PANEL
ALT: 20 U/L (ref 0–44)
AST: 15 U/L (ref 15–41)
Albumin: 3 g/dL — ABNORMAL LOW (ref 3.5–5.0)
Alkaline Phosphatase: 46 U/L (ref 38–126)
Anion gap: 7 (ref 5–15)
BUN: 7 mg/dL (ref 6–20)
CO2: 24 mmol/L (ref 22–32)
Calcium: 8.7 mg/dL — ABNORMAL LOW (ref 8.9–10.3)
Chloride: 105 mmol/L (ref 98–111)
Creatinine, Ser: 1.13 mg/dL (ref 0.61–1.24)
GFR calc Af Amer: >60 mL/min (ref >60)
GFR calc non Af Amer: >60 mL/min (ref >60)
Glucose, Bld: 137 mg/dL — ABNORMAL HIGH (ref 70–99)
Potassium: 3.8 mmol/L (ref 3.5–5.1)
Sodium: 136 mmol/L (ref 135–145)
Total Bilirubin: 1 mg/dL (ref 0.3–1.2)
Total Protein: 6.6 g/dL (ref 6.5–8.1)

## 2019-04-27 LAB — GLUCOSE, CAPILLARY
Glucose-Capillary: 64 mg/dL — ABNORMAL LOW (ref 70–99)
Glucose-Capillary: 161 mg/dL — ABNORMAL HIGH (ref 70–99)
Glucose-Capillary: 121 mg/dL — ABNORMAL HIGH (ref 70–99)

## 2019-04-27 MED ORDER — OXYCODONE HCL 5 MG PO TABS: 5.0000 mg | ORAL_TABLET | Freq: Four times a day (QID) | ORAL | 0 refills | Status: DC | PRN

## 2019-04-27 MED ORDER — WARFARIN SODIUM 5 MG PO TABS: 5.0000 mg | ORAL_TABLET | Freq: Every day | ORAL | 11 refills | Status: DC

## 2019-04-27 MED ORDER — ACETAMINOPHEN 325 MG PO TABS: 650.0000 mg | ORAL_TABLET | Freq: Four times a day (QID) | ORAL | Status: DC | PRN

## 2019-04-27 MED ORDER — WARFARIN SODIUM 7.5 MG PO TABS: 7.5000 mg | ORAL_TABLET | Freq: Once | ORAL | Status: DC

## 2019-04-27 NOTE — Progress Notes (Signed)
During shift report, pt sats dropped to 88%. Pt encouraged and demonstrated deep breathing and coughing. Pt also shown and demonstrated splinting incisions when coughing and deep breathing. Sats remained the same so pt placed on O2 at 2 L. Will continue to monitor. Lajoyce Corners, RN

## 2019-04-27 NOTE — Discharge Summary (Signed)
[facility] Discharge Summary   Patient ID: Isaac Ray MRN: [id number] DOB/AGE: 04-12-1971 48 y.o.  Admit date: 04/25/2019 Discharge date: 04/27/2019  Admitting Diagnosis: Acute appendicitis  Discharge Diagnosis Patient Active Problem List   Diagnosis Date Noted  . LV (left ventricular) mural thrombus without MI 04/27/2019  . Acute appendicitis 04/25/2019  . S/P laparoscopic appendectomy 04/25/2019  . Gangrene of toe of left foot (HCC)   . Critical lower limb ischemia 03/30/2019  . Ischemic cardiomyopathy 03/24/2019  . Critical limb ischemia with history of revascularization of same extremity 03/24/2019  . Type 2 diabetes mellitus (HCC) 02/28/2017  . 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 Essential hypertension 04/20/2015  . Hyperlipidemia 04/20/2015  . Diabetes (HCC) 04/20/2015  . 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 NSTEMI (non-ST elevated myocardial infarction) (HCC) 02/12/2015    Consultants Cardiology  Imaging: Ct Abdomen Pelvis W Contrast  Result Date: 04/25/2019 CLINICAL DATA:  48 year old male with abdominal pain and suspected appendicitis EXAM: CT ABDOMEN AND PELVIS WITH CONTRAST TECHNIQUE: Multidetector CT imaging of the abdomen and pelvis was performed using the standard protocol following bolus administration of intravenous contrast. CONTRAST:  OMNIPAQUE IOHEXOL 300 MG/ML  SOLN COMPARISON:  01/06/2017 FINDINGS: Lower chest: Filling defect at the apex of the left ventricle. Evidence of subendothelial hypodensity/hypoenhancement, likely representing a prior MI. Hepatobiliary: Unremarkable liver.  Unremarkable gallbladder. Pancreas: Unremarkable Spleen: Unremarkable Adrenals/Urinary Tract: Unremarkable appearance the bilateral adrenal glands. Right kidney without hydronephrosis or nephrolithiasis. No perinephric stranding. Unremarkable course the right ureter. Left kidney with no hydronephrosis. No perinephric stranding.  Density within the papillary region at the superior aspect of the left kidney may represent nonobstructive nephrolithiasis versus early excretion of contrast. Unremarkable course of the left ureter. Urinary bladder unremarkable. Stomach/Bowel: Unremarkable stomach. Compared to the prior CT there has been resolution of the wall thickening in the antral region. Unremarkable small bowel. Inflammatory changes at the appendix which demonstrates hyperenhancement. No calcified fecalith. No focal gas or abscess. Colonic diverticula. No associated inflammatory changes. No large stool burden. Vascular/Lymphatic: Mild atherosclerosis of the abdominal aorta. Calcifications of the bilateral iliac system. Bilateral iliac arteries and the proximal femoral arteries are patent. No adenopathy. Reproductive: Unremarkable prostate. Other: Bilateral fat containing inguinal hernia. No ventral wall hernia. Musculoskeletal: No acute displaced fracture. Mild degenerative changes of the visualized thoracolumbar spine. IMPRESSION: Acute appendicitis, uncomplicated. **An incidental finding of potential clinical significance has been found. 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 These results were discussed by telephone at the time of interpretation on 04/25/2019 at 3:07 pm with Dr. Cathren Laine. Aortic Atherosclerosis (ICD10-I70.0). Additional ancillary findings as above. Electronically Signed   By: Gilmer Mor D.O.   On: 04/25/2019 15:08    Procedures Dr. Sheliah Hatch (04/25/19) - Laparoscopic Appendectomy  Hospital Course:  Isaac Ray is a 48yo male who presented [facility]27 with 1 day of central abdominal pain that localized to the RLQ.  Workup showed acute appendicitis.  Patient was admitted and underwent procedure listed above.   Tolerated procedure well and was transferred to the floor.  Diet was advanced as tolerated.   Patient was incidentally noted to have a left ventricular thrombus. Cardiology was consulted and started that patient on coumadin and plavix, aspirin was stopped. On POD2, the patient was voiding well, tolerating diet, ambulating  well, pain well controlled, vital signs stable, incisions c/d/i and felt stable for discharge home.  Patient will follow up as below and knows to call with questions or concerns.     Physical Exam: General:  Alert, NAD, pleasant, comfortable Pulm: effort normal, CTAB, pulling up to 1250 on IS Abd:  Soft, ND, appropriately tender, multiple lap incisions C/D/I Msk: calves soft and nontender without edema  Allergies as of 04/27/2019   No Known Allergies     Medication List    STOP taking these medications   aspirin EC 81 MG tablet   ciprofloxacin 500 MG tablet Commonly known as: Cipro   oxyCODONE-acetaminophen 5-325 MG tablet Commonly known as: PERCOCET/ROXICET     TAKE these medications   acetaminophen 325 MG tablet Commonly known as: TYLENOL Take 2 tablets (650 mg total) by mouth every 6 (six) hours as needed for mild pain (or temp > 100).   atorvastatin 80 MG tablet Commonly known as: LIPITOR Take 1 tablet (80 mg total) by mouth daily.   carvedilol 3.125 MG tablet Commonly known as: COREG Take 1 tablet (3.125 mg total) by mouth 2 (two) times daily with a meal.   clopidogrel 75 MG tablet Commonly known as: PLAVIX Take 1 tablet (75 mg total) by mouth daily with breakfast.   glipiZIDE 2.5 MG 24 hr tablet Commonly known as: GLUCOTROL XL Take 1 tablet (2.5 mg total) by mouth daily with breakfast.   isosorbide mononitrate 30 MG 24 hr tablet Commonly known as: IMDUR Take 1 tablet (30 mg total) by mouth daily.   lisinopril 10 MG tablet Commonly known as: ZESTRIL Take 1 tablet (10 mg total) by mouth daily.   metFORMIN 1000 MG tablet Commonly known as:  GLUCOPHAGE Take 1 tablet (1,000 mg total) by mouth 2 (two) times daily with a meal.   oxyCODONE 5 MG immediate release tablet Commonly known as: Oxy IR/ROXICODONE Take 1 tablet (5 mg total) by mouth every 6 (six) hours as needed for severe pain.   warfarin 5 MG tablet Commonly known as: Coumadin Take 1 tablet (5 mg total) by mouth daily. Take as directed, start with 1 tab daily        Follow-up [facility] Go on 05/14/2019.   Specialty: General Surgery Why: Su cita es 16 de julio a las 3:30 por la tarde [place] 30 minutos antes de su cita para registrarse y Education officer, museum. Sheela Stack identificacin con foto y cualquier informacin de seguro. Contact information: [address] [place] [phone]       Lorretta Harp, MD. Call.   Specialties: Cardiology, Radiology Why: call to arrange follow up regarding left ventricular thrombus Contact information: [address] [place] [address] [phone]           Signed: Wellington Hampshire, [facility] 04/27/2019, 9:11 AM Pager: [phone] Mon-Thurs 7:00 am-4:30 pm Fri 7:00 am -11:30 AM Sat-Sun 7:00 am-11:30 am

## 2019-04-27 NOTE — Progress Notes (Signed)
ANTICOAGULATION CONSULT NOTE  Pharmacy Consult for warfarin Indication: LV thrombus  No Known Allergies  Patient Measurements: Height: 5\' 7"  (170.2 cm) Weight: 165 lb 5.5 oz (75 kg) IBW/kg (Calculated) : 66.1  Vital Signs: Temp: 99.9 F (37.7 C) (06/29 0342) Temp Source: Oral (06/29 0342) BP: 117/71 (06/29 0833) Pulse Rate: 67 (06/29 0833)  Labs: Recent Labs    04/25/19 1223 04/26/19 0323 04/26/19 1114 04/27/19 0356  HGB 13.4 11.8*  --  12.9*  HCT 40.8 34.8*  --  38.9*  PLT 164 126*  --  134*  LABPROT  --   --  16.5* 15.6*  INR  --   --  1.4* 1.3*  CREATININE 1.36* 1.13  --  1.13    Estimated Creatinine Clearance: 73.9 mL/min (by C-G formula based on SCr of 1.13 mg/dL).   Medical History: Past Medical History:  Diagnosis Date  . CAD (coronary artery disease)   . Essential hypertension   . Heart murmur    when I was a teenager- no mention of it now  . Hyperlipidemia   . LV (left ventricular) mural thrombus without MI 04/27/2019  . Type 2 diabetes mellitus (HCC)    Type II    Medications:  Medications Prior to Admission  Medication Sig Dispense Refill Last Dose  . aspirin EC 81 MG tablet Take 1 tablet (81 mg total) by mouth daily. 90 tablet 3 04/24/2019 at am  . atorvastatin (LIPITOR) 80 MG tablet Take 1 tablet (80 mg total) by mouth daily. 90 tablet 3 04/24/2019 at am  . carvedilol (COREG) 3.125 MG tablet Take 1 tablet (3.125 mg total) by mouth 2 (two) times daily with a meal. 180 tablet 3 04/24/2019 at 800  . ciprofloxacin (CIPRO) 500 MG tablet Take 1 tablet (500 mg total) by mouth 2 (two) times daily. 10 tablet 0 04/22/2019  . clopidogrel (PLAVIX) 75 MG tablet Take 1 tablet (75 mg total) by mouth daily with breakfast. 90 tablet 2 04/24/2019 at am  . glipiZIDE (GLUCOTROL XL) 2.5 MG 24 hr tablet Take 1 tablet (2.5 mg total) by mouth daily with breakfast. 90 tablet 2 04/24/2019 at am  . isosorbide mononitrate (IMDUR) 30 MG 24 hr tablet Take 1 tablet (30 mg total) by  mouth daily. 90 tablet 3 04/24/2019 at am  . lisinopril (ZESTRIL) 10 MG tablet Take 1 tablet (10 mg total) by mouth daily. 90 tablet 3 04/24/2019 at am  . metFORMIN (GLUCOPHAGE) 1000 MG tablet Take 1 tablet (1,000 mg total) by mouth 2 (two) times daily with a meal. 60 tablet 3 04/25/2019 at am  . oxyCODONE-acetaminophen (PERCOCET/ROXICET) 5-325 MG tablet Take 1 tablet by mouth every 4 (four) hours as needed for severe pain.   04/24/2019 at Unknown time    Assessment: 48 y/o male admitted for abdominal pain now s/p laporoscopic appendectomy on 6/27. Echo done pre-op showed small LV thrombus. Pharmacy consulted todose warfarin. On aspirin and clopidogrel PTA with plan to stop aspirin while on warfarin. He is also on enoxaparin 40mg  [place] q24h. -INR= 1.3   Goal of Therapy:  INR 2-3 Monitor platelets by anticoagulation protocol: Yes   Plan:  Warfarin 7.5 mg PO tonight Discontinue enoxaparin when INR >=2 Daily INR  Hildred Laser, PharmD Clinical Pharmacist **Pharmacist phone directory can now be found on [email or web address] ([email or web address]).  Listed under [place].

## 2019-04-27 NOTE — Progress Notes (Addendum)
Progress Note  Patient Name: Isaac Ray Date of Encounter: 04/27/2019  Primary Cardiologist:  Nanetta Batty, MD  Subjective   Video translator used. No chest pain or SOB, no BM yet but passing gas, feels ready for d/c  Inpatient Medications    Scheduled Meds: . carvedilol  3.125 mg Oral BID WC  . clopidogrel  75 mg Oral Daily  . enoxaparin (LOVENOX) injection  40 mg Subcutaneous Q24H  . insulin aspart  0-15 Units Subcutaneous TID WC  . lisinopril  10 mg Oral Daily  . Warfarin - Pharmacist Dosing Inpatient   Does not apply q1800   Continuous Infusions: . dextrose 5 % and 0.45% NaCl 50 mL/hr at 04/26/19 2243   PRN Meds: acetaminophen **OR** acetaminophen, metoprolol tartrate, morphine injection, ondansetron **OR** ondansetron (ZOFRAN) IV, oxyCODONE   Prior to Admission medications   Medication Sig Start Date End Date Taking? Authorizing Provider  aspirin EC 81 MG tablet Take 1 tablet (81 mg total) by mouth daily. 03/24/19  Yes Runell Gess, MD  atorvastatin (LIPITOR) 80 MG tablet Take 1 tablet (80 mg total) by mouth daily. 03/27/19  Yes Runell Gess, MD  carvedilol (COREG) 3.125 MG tablet Take 1 tablet (3.125 mg total) by mouth 2 (two) times daily with a meal. 03/27/19  Yes Runell Gess, MD  ciprofloxacin (CIPRO) 500 MG tablet Take 1 tablet (500 mg total) by mouth 2 (two) times daily. 04/22/19  Yes Anders Simmonds, PA-C  clopidogrel (PLAVIX) 75 MG tablet Take 1 tablet (75 mg total) by mouth daily with breakfast. 03/31/19  Yes Georgie Chard D, NP  glipiZIDE (GLUCOTROL XL) 2.5 MG 24 hr tablet Take 1 tablet (2.5 mg total) by mouth daily with breakfast. 02/28/17  Yes Hairston, Mandesia R, FNP  isosorbide mononitrate (IMDUR) 30 MG 24 hr tablet Take 1 tablet (30 mg total) by mouth daily. 03/27/19  Yes Runell Gess, MD  lisinopril (ZESTRIL) 10 MG tablet Take 1 tablet (10 mg total) by mouth daily. 03/27/19  Yes Runell Gess, MD  metFORMIN (GLUCOPHAGE)  1000 MG tablet Take 1 tablet (1,000 mg total) by mouth 2 (two) times daily with a meal. 04/02/19  Yes Grayce Sessions, NP  oxyCODONE-acetaminophen (PERCOCET/ROXICET) 5-325 MG tablet Take 1 tablet by mouth every 4 (four) hours as needed for severe pain.   Yes [provider]    Vital Signs    Vitals:   04/26/19 2000 04/26/19 2244 04/27/19 0003 04/27/19 0342  BP:   (!) 98/53 121/64  Pulse:   68   Resp:   20 (!) 25  Temp:   98.5 F (36.9 C) 99.9 F (37.7 C)  TempSrc:   Oral Oral  SpO2: (!) 89% 97% 94% 90%  Weight:      Height:        Intake/Output Summary (Last 24 hours) at 04/27/2019 0819 Last data filed at 04/27/2019 0346 Gross per 24 hour  Intake 843.47 ml  Output 2450 ml  Net -1606.53 ml   Filed Weights   04/25/19 1209  Weight: 75 kg   Last Weight  Most recent update: 04/25/2019 12:09 PM   Weight  75 kg (165 lb 5.5 oz)           Weight change:    Telemetry    SR, PVCs and pairs - Personally Reviewed  ECG    None today - Personally Reviewed  Physical Exam   General: Well developed, well nourished, male appearing in no acute  distress. Head: Normocephalic, atraumatic, poor dentition.  Neck: Supple without bruits, JVD not elevated. Lungs:  Resp regular and unlabored, ATX noted bases, improves w/ spirometry. Heart: RRR, S1, S2, no S3, S4, or murmur; no rub. Abdomen: Soft, tender, non-distended with normoactive bowel sounds. No hepatomegaly. No rebound/guarding. No obvious abdominal masses. Extremities: No clubbing, cyanosis, no edema.  Neuro: Alert and oriented X 3. Moves all extremities spontaneously. Psych: Normal affect.  Labs    Hematology Recent Labs  Lab 04/25/19 1223 04/26/19 0323 04/27/19 0356  WBC 11.8* 11.8* 11.9*  RBC 4.30 3.73* 4.15*  HGB 13.4 11.8* 12.9*  HCT 40.8 34.8* 38.9*  MCV 94.9 93.3 93.7  MCH 31.2 31.6 31.1  MCHC 32.8 33.9 33.2  RDW 12.7 12.9 12.6  PLT 164 126* 134*    Chemistry Recent Labs  Lab 04/25/19 1223  04/26/19 0323 04/27/19 0356  NA 139 136 136  K 3.8 3.6 3.8  CL 105 106 105  CO2 22 21* 24  GLUCOSE 177* 174* 137*  BUN 13 8 7   CREATININE 1.36* 1.13 1.13  CALCIUM 9.4 8.4* 8.7*  PROT 6.9 5.8* 6.6  ALBUMIN 3.9 3.0* 3.0*  AST 20 17 15   ALT 24 23 20   ALKPHOS 49 42 46  BILITOT 0.6 0.7 1.0  GFRNONAA >60 >60 >60  GFRAA >60 >60 >60  ANIONGAP 12 9 7     Lab Results  Component Value Date   INR 1.3 (H) 04/27/2019   INR 1.4 (H) 04/26/2019   INR 1.05 11/28/2016     Radiology    Ct Abdomen Pelvis W Contrast  Result Date: 04/25/2019 CLINICAL DATA:  48 year old male with abdominal pain and suspected appendicitis EXAM: CT ABDOMEN AND PELVIS WITH CONTRAST TECHNIQUE: Multidetector CT imaging of the abdomen and pelvis was performed using the standard protocol following bolus administration of intravenous contrast. CONTRAST:  155mL OMNIPAQUE IOHEXOL 300 MG/ML  SOLN COMPARISON:  01/06/2017 FINDINGS: Lower chest: Filling defect at the apex of the left ventricle. Evidence of subendothelial hypodensity/hypoenhancement, likely representing a prior MI. Hepatobiliary: Unremarkable liver.  Unremarkable gallbladder. Pancreas: Unremarkable Spleen: Unremarkable Adrenals/Urinary Tract: Unremarkable appearance the bilateral adrenal glands. Right kidney without hydronephrosis or nephrolithiasis. No perinephric stranding. Unremarkable course the right ureter. Left kidney with no hydronephrosis. No perinephric stranding. Density within the papillary region at the superior aspect of the left kidney may represent nonobstructive nephrolithiasis versus early excretion of contrast. Unremarkable course of the left ureter. Urinary bladder unremarkable. Stomach/Bowel: Unremarkable stomach. Compared to the prior CT there has been resolution of the wall thickening in the antral region. Unremarkable small bowel. Inflammatory changes at the appendix which demonstrates hyperenhancement. No calcified fecalith. No focal gas or  abscess. Colonic diverticula. No associated inflammatory changes. No large stool burden. Vascular/Lymphatic: Mild atherosclerosis of the abdominal aorta. Calcifications of the bilateral iliac system. Bilateral iliac arteries and the proximal femoral arteries are patent. No adenopathy. Reproductive: Unremarkable prostate. Other: Bilateral fat containing inguinal hernia. No ventral wall hernia. Musculoskeletal: No acute displaced fracture. Mild degenerative changes of the visualized thoracolumbar spine. IMPRESSION: Acute appendicitis, uncomplicated. **An incidental finding of potential clinical significance has been found. 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 These results were discussed by telephone at the time of interpretation on 04/25/2019 at 3:07 pm with Dr. Lajean Saver. Aortic Atherosclerosis (ICD10-I70.0). Additional ancillary findings as above. Electronically Signed  By: Gilmer MorJaime  Wagner D.O.   On: 04/25/2019 15:08     Cardiac Studies   ECHO: 04/25/2019  1. The left ventricle has moderate-severely reduced systolic function, with an ejection fraction of 30-35%. The cavity size was severely dilated. Left ventricular diastolic Doppler parameters are consistent with impaired relaxation. Left ventricular  diffuse hypokinesis. Small LV apical thrombus could only be visualized on Definity images. 2. The right ventricle has normal systolc function. The cavity was normal. There is no increase in right ventricular wall thickness. 3. Left atrial size was mildly dilated. 4. The aortic valve is tricuspid No stenosis of the aortic valve. 5. The aortic root is normal in size and structure. 6. No evidence of mitral valve stenosis. Trivial mitral regurgitation. 7. The IVC was normal in size. No complete TR doppler  jet so unable to estimate PA systolic pressure.   Patient Profile     48 y.o. male w/ hx CABG 2016, ICM w/ 30-35%, DM, HTN, HLD, L- CFA PTA 03/30/2019, L 4th toe amputation 04/03/2019, was admitted 06/27 with acute appendicitis, LV thrombus seen on echo but surgery not delayed.  Assessment & Plan    1. LV thrombus - pt started on coumadin, bridging not needed, stop ASA and continue Plavix w/ recent PV PCI, per Dr Verna CzechBranch's note 06/28 - will make coumadin appt later this week - emphasized to pt to contact us for any bleeding and not miss doses  2. CAD/ICM - no ischemic sx, volume status ok - needs daily wts - on home doses of Coreg, lisinopril, Plavix - restart Imdur - needs TCM appt   3. Appendicitis s/p surgery - ok for d/c per [facility] - agree w/ bowel regimen, he should not strain  Otherwise, per [facility] Principal Problem:   Acute appendicitis Active Problems:   S/P laparoscopic appendectomy   LV (left ventricular) mural thrombus without MI    SignedTheodore Demark, Rhonda Barrett , PA-C 8:19 AM 04/27/2019 Pager: [phone]   Attending Note:   The patient was seen and examined.  Agree with assessment and plan as noted above.  Changes made to the above note as needed.  Patient seen and independently examined with Theodore Demarkhonda Barrett, PA .   We discussed all aspects of the encounter. I agree with the assessment and plan as stated above.  1.  LV thrombus.  Is now on coumadin.  INR is 1.3    Will need to [facility] after [facility].  He is doing well and will be able to be discharged when OK with the surgical team .  2.  Chronic systolic CHF:  EF 30-35%.  Cont. Lisinopril, coreg.    3.   PVD:  Continue plavix.   Watch for bleeding .    [facility] will sign off.   Medication Recommendations:  Continue meds.  Other recommendations (labs, testing, etc):   Follow up as an outpatient:  With Dr. Allyson SabalBerry and [facility].     Vesta MixerPhilip J. Maico Mulvehill, Montez HagemanJr., MD, [facility] Grant[facility]ACC 04/27/2019, 10:41 AM [address],  [address] Office [phone]- [phone] Pager [phone]- [phone]

## 2019-04-27 NOTE — Progress Notes (Addendum)
Hypoglycemic Event  CBG:64 At 0622  Treatment: 8 oz juice  Symptoms:none  Follow-up CBG: VVOH:[id number] CBG Result: 161 Pt given 3 units insulin aspart  Comments/MD notified: lab results showed blood glucose result of 137. CBG reading could have been a false low one.    Lajoyce Corners

## 2019-04-27 NOTE — Progress Notes (Signed)
Discharge AVS meds take and those due reviewed with pt. Follow up appointments and when to call MD reviewed. All questions and concerns addressed. No further questions at this time. Discharge instructions given using video interpretive services. D/c IV and TELE, CCMD notified. D/C home per orders.

## 2019-04-27 NOTE — Progress Notes (Addendum)
Pt maintaining O2 sats in the mid-90s. Pt O2 level decreased from 2 L to 1 L on nasal cannula. Pt resting comfortably. Will continue to monitor. Lajoyce Corners, RN

## 2019-04-28 ENCOUNTER — Telehealth: Payer: Self-pay | Admitting: Emergency Medicine

## 2019-04-28 ENCOUNTER — Ambulatory Visit: Payer: Self-pay | Admitting: Orthopedic Surgery

## 2019-04-28 NOTE — Telephone Encounter (Signed)
Nurse called the patient's home phone number but received no answer and message was left on the voicemail for the patient to call back.  Return phone number given. 

## 2019-04-30 ENCOUNTER — Other Ambulatory Visit: Payer: Self-pay

## 2019-04-30 ENCOUNTER — Ambulatory Visit (INDEPENDENT_AMBULATORY_CARE_PROVIDER_SITE_OTHER): Payer: Self-pay

## 2019-04-30 ENCOUNTER — Ambulatory Visit (HOSPITAL_COMMUNITY)
Admission: RE | Admit: 2019-04-30 | Discharge: 2019-04-30 | Disposition: A | Discharge status: Home / Self Care | Payer: Self-pay | Source: Ambulatory Visit | Attending: Cardiology | Admitting: Cardiology

## 2019-04-30 DIAGNOSIS — Z7901 Long term (current) use of anticoagulants: Secondary | ICD-10-CM

## 2019-04-30 DIAGNOSIS — I739 Peripheral vascular disease, unspecified: Secondary | ICD-10-CM | POA: Insufficient documentation

## 2019-04-30 DIAGNOSIS — I513 Intracardiac thrombosis, not elsewhere classified: Secondary | ICD-10-CM

## 2019-04-30 DIAGNOSIS — I240 Acute coronary thrombosis not resulting in myocardial infarction: Secondary | ICD-10-CM

## 2019-04-30 LAB — POCT INR: INR: 1.4 — AB (ref 2.0–3.0)

## 2019-04-30 NOTE — Patient Instructions (Addendum)
Description   Take 1.5 tablets tomorrow and Saturday, then start taking 1 tablet daily except 1.5 tablets on Mondays, Wednesdays and Fridays.  Recheck on Tuesday when seeing Kerin Ransom, [place].  Call with bleeding problems or medication changes [phone].   A full discussion of the nature of anticoagulants has been carried out.  A benefit risk analysis has been presented to the patient, so that they understand the justification for choosing anticoagulation at this time. The need for frequent and regular monitoring, precise dosage adjustment and compliance is stressed.  Side effects of potential bleeding are discussed.  The patient should avoid any OTC items containing aspirin or ibuprofen, and should avoid great swings in general diet.  Avoid alcohol consumption.  Call if any signs of abnormal bleeding.

## 2019-05-04 ENCOUNTER — Ambulatory Visit (INDEPENDENT_AMBULATORY_CARE_PROVIDER_SITE_OTHER): Payer: Self-pay | Admitting: Physician Assistant

## 2019-05-04 ENCOUNTER — Other Ambulatory Visit: Payer: Self-pay

## 2019-05-04 ENCOUNTER — Encounter: Payer: Self-pay | Admitting: Orthopedic Surgery

## 2019-05-04 VITALS — Ht 67.0 in | Wt 165.0 lb

## 2019-05-04 DIAGNOSIS — Z89422 Acquired absence of other left toe(s): Secondary | ICD-10-CM

## 2019-05-04 DIAGNOSIS — I779 Disorder of arteries and arterioles, unspecified: Secondary | ICD-10-CM

## 2019-05-04 DIAGNOSIS — I998 Other disorder of circulatory system: Secondary | ICD-10-CM

## 2019-05-04 DIAGNOSIS — I70229 Atherosclerosis of native arteries of extremities with rest pain, unspecified extremity: Secondary | ICD-10-CM

## 2019-05-04 DIAGNOSIS — E1142 Type 2 diabetes mellitus with diabetic polyneuropathy: Secondary | ICD-10-CM

## 2019-05-04 NOTE — Progress Notes (Signed)
Notes recorded by Lorretta Harp, MD on 05/03/2019 at 1:39 PM EDT  Left ABI increase status post atherectomy and drug-coated balloon angioplasty. Repeat 6 months   PAD (peripheral artery disease)

## 2019-05-04 NOTE — Progress Notes (Signed)
Office Visit Note   Patient: Isaac Ray           Date of Birth: 07-19-1971           MRN: [id number] Visit Date: 05/04/2019              Requested by: Lizbeth Bark, FNP [address] [place],  [place] [address] PCP: Lizbeth Bark, FNP  Chief Complaint  Patient presents with  . Left Foot - Routine Post Op    04/08/2019 left foot 4th toe amputation       HPI: The patient is a 48 yo gentlemen who is seen for post operative follow up following left 4th toe amputation on 04/08/2019. He has been non weight bearing and in a post op shoe. He reports no problems or pain over the area.  He is seen today with the assistance of a Spanish interpreter.   Assessment & Plan: Visit Diagnoses:  1. Status post amputation of lesser toe of left foot (HCC)   2. Type 2 diabetes mellitus with diabetic polyneuropathy, without long-term current use of insulin (HCC)   3. PAOD (peripheral arterial occlusive disease) (HCC)     Plan: Sutures removed today. May begin wearing regular foot wear and begin weight bearing as tolerated. Follow up in 4 weeks or sooner if any difficulties in the interim.   Follow-Up Instructions: Return in about 4 weeks (around 06/01/2019).   Ortho Exam  Patient is alert, oriented, no adenopathy, well-dressed, normal affect, normal respiratory effort. The left foot 4th toe amputation site is healing well and sutures removed today. No signs of infection or cellulitis. No edema. Palpable dorsalis pedis and posterior tibialis pulses.   Imaging: No results found. No images are attached to the encounter.  Labs: Lab Results  Component Value Date   HGBA1C 6.5 (A) 04/02/2019   HGBA1C 6.8 02/28/2017   HGBA1C 8.6 12/05/2016     Lab Results  Component Value Date   ALBUMIN 3.0 (L) 04/27/2019   ALBUMIN 3.0 (L) 04/26/2019   ALBUMIN 3.9 04/25/2019    Lab Results  Component Value Date   MG 1.8 02/17/2015   MG 2.0 02/17/2015   MG 2.6 (H)  02/16/2015   No results found for: VD25OH  No results found for: PREALBUMIN CBC EXTENDED Latest Ref Rng & Units 04/27/2019 04/26/2019 04/25/2019  WBC 4.0 - 10.5 K/uL 11.9(H) 11.8(H) 11.8(H)  RBC 4.22 - 5.81 MIL/uL 4.15(L) 3.73(L) 4.30  HGB 13.0 - 17.0 g/dL 12.9(L) 11.8(L) 13.4  HCT 39.0 - 52.0 % 38.9(L) 34.8(L) 40.8  PLT 150 - 400 K/uL 134(L) 126(L) 164  NEUTROABS 1.7 - 7.7 K/uL - - -  LYMPHSABS 0.7 - 4.0 K/uL - - -     Body mass index is 25.84 kg/m.  Orders:  No orders of the defined types were placed in this encounter.  No orders of the defined types were placed in this encounter.    Procedures: No procedures performed  Clinical Data: No additional findings.  ROS:  All other systems negative, except as noted in the HPI. Review of Systems  Objective: Vital Signs: Ht 5\' 7"  (1.702 m)   Wt 165 lb (74.8 kg)   BMI 25.84 kg/m   Specialty Comments:  No specialty comments available.  PMFS History: Patient Active Problem List   Diagnosis Date Noted  . Long term (current) use of anticoagulants 04/30/2019  . LV (left ventricular) mural thrombus without MI 04/27/2019  . Acute appendicitis 04/25/2019  .  S/P laparoscopic appendectomy 04/25/2019  . Gangrene of toe of left foot ([place])   . Critical lower limb ischemia 03/30/2019  . Ischemic cardiomyopathy 03/24/2019  . Critical limb ischemia with history of revascularization of same extremity 03/24/2019  . Type 2 diabetes mellitus ([place]) 02/28/2017  . PAOD (peripheral arterial occlusive disease) ([place]) 02/08/2017  . Essential hypertension 04/20/2015  . Hyperlipidemia 04/20/2015  . Diabetes ([place]) 04/20/2015  . 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 NSTEMI (non-ST elevated myocardial infarction) ([place]) 02/12/2015   Past Medical History:  Diagnosis Date  . CAD (coronary artery disease)   . Essential hypertension   . Heart murmur    when I was a teenager- no mention of it now  . Hyperlipidemia   . LV (left ventricular) mural thrombus  without MI 04/27/2019  . Type 2 diabetes mellitus (HCC)    Type II    Family History  Problem Relation Age of Onset  . Coronary artery disease Father        had CABG  . Heart disease Father     Past Surgical History:  Procedure Laterality Date  . AMPUTATION Left 04/08/2019   Procedure: LEFT FOOT 4TH TOE AMPUTATION;  Surgeon: Newt Minion, MD;  Location: [place];  Service: Orthopedics;  Laterality: Left;  . CORONARY ANGIOPLASTY WITH STENT PLACEMENT  2013  . CORONARY ARTERY BYPASS GRAFT N/A 02/16/2015   Procedure: CORONARY ARTERY BYPASS GRAFTING (CABG) x5 using left internal mammary artery, right thigh greater saphenous vein, and left radial artery. ;  Surgeon: Melrose Nakayama, MD;  Location: [place];  Service: Open Heart Surgery;  Laterality: N/A;  . LAPAROSCOPIC APPENDECTOMY N/A 04/25/2019   Procedure: APPENDECTOMY LAPAROSCOPIC;  Surgeon: Kieth Brightly Arta Bruce, MD;  Location: [place];  Service: General;  Laterality: N/A;  . LEFT HEART CATHETERIZATION WITH CORONARY ANGIOGRAM N/A 02/14/2015   Procedure: LEFT HEART CATHETERIZATION WITH CORONARY ANGIOGRAM;  Surgeon: Lorretta Harp, MD;  Location[facility];  Service: Cardiovascular;  Laterality: N/A;  . LOWER EXTREMITY ANGIOGRAPHY N/A 03/30/2019   Procedure: LOWER EXTREMITY ANGIOGRAPHY;  Surgeon: Lorretta Harp, MD;  Location: [place] CV LAB;  Service: Cardiovascular;  Laterality: N/A;  . RADIAL ARTERY HARVEST Left 02/16/2015   Procedure: RADIAL ARTERY HARVEST;  Surgeon: Melrose Nakayama, MD;  Location: [place];  Service: Open Heart Surgery;  Laterality: Left;  . TEE WITHOUT CARDIOVERSION N/A 02/16/2015   Procedure: TRANSESOPHAGEAL ECHOCARDIOGRAM (TEE);  Surgeon: Melrose Nakayama, MD;  Location: [place];  Service: Open Heart Surgery;  Laterality: N/A;   Social History   Occupational History  . Not on file  Tobacco Use  . Smoking status: Former Smoker    Years: 30.00    Quit date: 02/16/2015    Years since quitting: 4.2  .  Smokeless tobacco: Never Used  Substance and Sexual Activity  . Alcohol use: No    Alcohol/week: 0.0 standard drinks  . Drug use: No  . Sexual activity: Not on file

## 2019-05-05 ENCOUNTER — Ambulatory Visit (INDEPENDENT_AMBULATORY_CARE_PROVIDER_SITE_OTHER): Payer: Self-pay | Admitting: Cardiology

## 2019-05-05 ENCOUNTER — Ambulatory Visit (INDEPENDENT_AMBULATORY_CARE_PROVIDER_SITE_OTHER): Payer: Self-pay | Admitting: Pharmacist

## 2019-05-05 ENCOUNTER — Encounter: Payer: Self-pay | Admitting: Cardiology

## 2019-05-05 DIAGNOSIS — I70229 Atherosclerosis of native arteries of extremities with rest pain, unspecified extremity: Secondary | ICD-10-CM

## 2019-05-05 DIAGNOSIS — I998 Other disorder of circulatory system: Secondary | ICD-10-CM

## 2019-05-05 DIAGNOSIS — I240 Acute coronary thrombosis not resulting in myocardial infarction: Secondary | ICD-10-CM

## 2019-05-05 DIAGNOSIS — Z7901 Long term (current) use of anticoagulants: Secondary | ICD-10-CM

## 2019-05-05 DIAGNOSIS — I255 Ischemic cardiomyopathy: Secondary | ICD-10-CM

## 2019-05-05 DIAGNOSIS — Z951 Presence of aortocoronary bypass graft: Secondary | ICD-10-CM

## 2019-05-05 DIAGNOSIS — I513 Intracardiac thrombosis, not elsewhere classified: Secondary | ICD-10-CM

## 2019-05-05 DIAGNOSIS — E119 Type 2 diabetes mellitus without complications: Secondary | ICD-10-CM

## 2019-05-05 LAB — POCT INR: INR: 3 (ref 2.0–3.0)

## 2019-05-05 NOTE — Progress Notes (Signed)
Cardiology Office Note:    Date:  05/05/2019   ID:  Isaac Ray, DOB 1970/11/01, MRN [id number]  PCP:  Lizbeth BarkHairston, Mandesia R, FNP  Cardiologist:  Nanetta BattyJonathan Berry, MD  Electrophysiologist:  None   Referring MD: Thomas HoffHairston, Mandesia R, F*   No chief complaint on file.   History of Present Illness:    Isaac Ray is a 48 y.o. male with a hx of CAD, he had PCI while in [place] in 2013 and ultimately underwent CABG x5 in April 2016.  He had recurrent chest pain later in June 2016.  Echocardiogram showed an ejection fraction of 30 to 35%, that was somewhat improved from his pre-CABG LV function.  Dr Allyson SabalBerry also mentions and Myoview but I could find no evidence that this was ever done.  Patient has vascular disease and in early June 2020 underwent left common femoral artery PTA prior to left fourth toe amputation on 04/08/2019 by Dr. Lajoyce Cornersuda.  He was then admitted to the hospital with acute appendicitis.  During his work-up there was an LV thrombus noted on CT scan as an incidental finding.  This was confirmed by echocardiogram with Definity contrast.  The patient underwent laparoscopic appendectomy 04/25/2019 without complications.  Warfarin was added postoperatively.    He is seen in the office today for follow-up.  He was accompanied by an interpreter.  He is done well since discharge.  He has had no problems with his warfarin therapy.  Our pharmacist is following him as well.  In reviewing his echocardiogram, it appears his LV function is about the same as it was in 2016 when he had an echo.  He is not had chest pain or unusual shortness of breath or symptoms of heart failure.  Past Medical History:  Diagnosis Date  . CAD (coronary artery disease)   . Essential hypertension   . Heart murmur    when I was a teenager- no mention of it now  . Hyperlipidemia   . LV (left ventricular) mural thrombus without MI 04/27/2019  . Type 2 diabetes mellitus (HCC)    Type II    Past  Surgical History:  Procedure Laterality Date  . AMPUTATION Left 04/08/2019   Procedure: LEFT FOOT 4TH TOE AMPUTATION;  Surgeon: Nadara Mustarduda, Marcus V, MD;  [facility];  Service: Orthopedics;  Laterality: Left;  . CORONARY ANGIOPLASTY WITH STENT PLACEMENT  2013  . CORONARY ARTERY BYPASS GRAFT N/A 02/16/2015   Procedure: CORONARY ARTERY BYPASS GRAFTING (CABG) x5 using left internal mammary artery, right thigh greater saphenous vein, and left radial artery. ;  Surgeon: Loreli SlotSteven C Hendrickson, MD;  [facility];  Service: Open Heart Surgery;  Laterality: N/A;  . LAPAROSCOPIC APPENDECTOMY N/A 04/25/2019   Procedure: APPENDECTOMY LAPAROSCOPIC;  Surgeon: Sheliah HatchKinsinger, De BlanchLuke Aaron, MD;  Location: [facility] OR;  Service: General;  Laterality: N/A;  . LEFT HEART CATHETERIZATION WITH CORONARY ANGIOGRAM N/A 02/14/2015   Procedure: LEFT HEART CATHETERIZATION WITH CORONARY ANGIOGRAM;  Surgeon: Runell GessJonathan J Berry, MD;  [facility];  Service: Cardiovascular;  Laterality: N/A;  . LOWER EXTREMITY ANGIOGRAPHY N/A 03/30/2019   Procedure: LOWER EXTREMITY ANGIOGRAPHY;  Surgeon: Runell GessBerry, Jonathan J, MD;  Location: [facility] INVASIVE CV LAB;  Service: Cardiovascular;  Laterality: N/A;  . RADIAL ARTERY HARVEST Left 02/16/2015   Procedure: RADIAL ARTERY HARVEST;  Surgeon: Loreli SlotSteven C Hendrickson, MD;  [facility];  Service: Open Heart Surgery;  Laterality: Left;  . TEE WITHOUT CARDIOVERSION N/A 02/16/2015   Procedure: TRANSESOPHAGEAL ECHOCARDIOGRAM (TEE);  Surgeon: Melrose Nakayama, MD;  Location: [place];  Service: Open Heart Surgery;  Laterality: N/A;    Current Medications: Current Meds  Medication Sig  . acetaminophen (TYLENOL) 325 MG tablet Take 2 tablets (650 mg total) by mouth every 6 (six) hours as needed for mild pain (or temp > 100).  Marland Kitchen atorvastatin (LIPITOR) 80 MG tablet Take 1 tablet (80 mg total) by mouth daily.  . carvedilol (COREG) 3.125 MG tablet Take 1 tablet (3.125 mg total) by mouth 2 (two) times daily with a meal.   . clopidogrel (PLAVIX) 75 MG tablet Take 1 tablet (75 mg total) by mouth daily with breakfast.  . glipiZIDE (GLUCOTROL XL) 2.5 MG 24 hr tablet Take 1 tablet (2.5 mg total) by mouth daily with breakfast.  . isosorbide mononitrate (IMDUR) 30 MG 24 hr tablet Take 1 tablet (30 mg total) by mouth daily.  Marland Kitchen lisinopril (ZESTRIL) 10 MG tablet Take 1 tablet (10 mg total) by mouth daily.  . metFORMIN (GLUCOPHAGE) 1000 MG tablet Take 1 tablet (1,000 mg total) by mouth 2 (two) times daily with a meal.  . oxyCODONE (OXY IR/ROXICODONE) 5 MG immediate release tablet Take 1 tablet (5 mg total) by mouth every 6 (six) hours as needed for severe pain.  Marland Kitchen warfarin (COUMADIN) 5 MG tablet Take 1 tablet (5 mg total) by mouth daily. Take as directed, start with 1 tab daily     Allergies:   Patient has no known allergies.   Social History   Socioeconomic History  . Marital status: Single    Spouse name: Not on file  . Number of children: Not on file  . Years of education: Not on file  . Highest education level: Not on file  Occupational History  . Not on file  Social Needs  . Financial resource strain: Not on file  . Food insecurity    Worry: Not on file    Inability: Not on file  . Transportation needs    Medical: Not on file    Non-medical: Not on file  Tobacco Use  . Smoking status: Former Smoker    Years: 30.00    Quit date: 02/16/2015    Years since quitting: 4.2  . Smokeless tobacco: Never Used  Substance and Sexual Activity  . Alcohol use: No    Alcohol/week: 0.0 standard drinks  . Drug use: No  . Sexual activity: Not on file  Lifestyle  . Physical activity    Days per week: Not on file    Minutes per session: Not on file  . Stress: Not on file  Relationships  . Social Herbalist on phone: Not on file    Gets together: Not on file    Attends religious service: Not on file    Active member of club or organization: Not on file    Attends meetings of clubs or organizations:  Not on file    Relationship status: Not on file  Other Topics Concern  . Not on file  Social History Narrative  . Not on file     Family History: The patient's family history includes Coronary artery disease in his father; Heart disease in his father.  ROS:   Please see the history of present illness.     All other systems reviewed and are negative.  EKGs/Labs/Other Studies Reviewed:    The following studies were reviewed today: Echo June 2020  EKG:  EKG is not ordered today.    Recent  Labs: 04/27/2019: ALT 20; BUN 7; Creatinine, Ser 1.13; Hemoglobin 12.9; Platelets 134; Potassium 3.8; Sodium 136  Recent Lipid Panel    Component Value Date/Time   CHOL 144 02/28/2017 0939   TRIG 156 (H) 02/28/2017 0939   HDL 29 (L) 02/28/2017 0939   CHOLHDL 5.0 02/28/2017 0939   CHOLHDL 6.1 02/12/2015 0347   VLDL 34 02/12/2015 0347   LDLCALC 84 02/28/2017 0939    Physical Exam:    VS:  BP (!) 96/58 (BP Location: Left Arm, Patient Position: Sitting, Cuff Size: Normal)   Pulse 66   Temp 97.9 F (36.6 C)   Ht 5\' 7"  (1.702 m)   Wt 163 lb (73.9 kg)   BMI 25.53 kg/m     Wt Readings from Last 3 Encounters:  05/05/19 163 lb (73.9 kg)  05/04/19 165 lb (74.8 kg)  04/25/19 165 lb 5.5 oz (75 kg)     GEN:  Well nourished, well developed in no acute distress HEENT: Normal NECK: No JVD; No carotid bruits LYMPHATICS: No lymphadenopathy CARDIAC: RRR, no murmurs, rubs, gallops RESPIRATORY:  Clear to auscultation without rales, wheezing or rhonchi  ABDOMEN: Soft, non-tender, non-distended MUSCULOSKELETAL:  No edema; No deformity  SKIN: Warm and dry NEUROLOGIC:  Alert and oriented x 3 PSYCHIATRIC:  Normal affect   ASSESSMENT:    LV (left ventricular) mural thrombus without MI EF 30-35% by echo 2016- repeat echo June 2020- similar EF but now with LVT  Ischemic cardiomyopathy Ischemic cardiomyopathy-EF 30-35% 2016 and June 2020 No CHF on exam or by history  Long term (current) use of  anticoagulants Coumadin added June 2020 after incidental finding of LVT when he was admitted with appendicitis.   S/P CABG x 5 CABG x 5 2016. Myoview low risk 2016  Critical lower limb ischemia S/P LCFA PTA 03/30/2019 followed by Lt 4th toe amputation 04/08/2019  Non-insulin dependent type 2 diabetes mellitus (HCC) Complicated by vascular disease  PLAN:    Same Rx.  Will defer recommendations on possible ICD, Entresto, and duration of Warfarin Rx to Dr Allyson Sabal. F/u 2-3 months.    Medication Adjustments/Labs and Tests Ordered: Current medicines are reviewed at length with the patient today.  Concerns regarding medicines are outlined above.  No orders of the defined types were placed in this encounter.  No orders of the defined types were placed in this encounter.   Patient Instructions  Medication Instructions:  Your physician recommends that you continue on your current medications as directed. Please refer to the Current Medication list given to you today. If you need a refill on your cardiac medications before your next appointment, please call your pharmacy.   Lab work: None  If you have labs (blood work) drawn today and your tests are completely normal, you will receive your results only by: Marland Kitchen MyChart Message (if you have MyChart) OR . A paper copy in the mail If you have any lab test that is abnormal or we need to change your treatment, we will call you to review the results.  Testing/Procedures: None  Follow-Up: At [facility], you and your health needs are our priority.  As part of our continuing mission to provide you with exceptional heart care, we have created designated Provider Care Teams.  These Care Teams include your primary Cardiologist (physician) and Advanced Practice Providers (APPs -  Physician Assistants and Nurse Practitioners) who all work together to provide you with the care you need, when you need it. You will need a follow  up appointment in 3 months.   Please call our office 2 months in advance to schedule this appointment.  You may see Nanetta BattyJonathan Berry, MD or one of the following Advanced Practice Providers on your designated Care Team:   Corine ShelterLuke Marlaysia Lenig, PA-C Judy PimpleKrista Kroeger, [place] JerseyPA-C . Marjie Skiffallie Goodrich, PA-C  Any Other Special Instructions Will Be Listed Below (If Applicable).      Signed, Corine ShelterLuke Natallie Ravenscroft, PA-C  05/05/2019 2:31 PM    [facility]

## 2019-05-05 NOTE — Assessment & Plan Note (Signed)
Complicated by vascular disease

## 2019-05-05 NOTE — Assessment & Plan Note (Signed)
Coumadin added June 2020 after incidental finding of LVT when he was admitted with appendicitis.

## 2019-05-05 NOTE — Assessment & Plan Note (Signed)
Ischemic cardiomyopathy-EF 30-35% 2016 and June 2020 No CHF on exam or by history

## 2019-05-05 NOTE — Patient Instructions (Addendum)
Medication Instructions:  Your physician recommends that you continue on your current medications as directed. Please refer to the Current Medication list given to you today. If you need a refill on your cardiac medications before your next appointment, please call your pharmacy.   Lab work: None  If you have labs (blood work) drawn today and your tests are completely normal, you will receive your results only by: . MyChart Message (if you have MyChart) OR . A paper copy in the mail If you have any lab test that is abnormal or we need to change your treatment, we will call you to review the results.  Testing/Procedures: None   Follow-Up: At [facility], you and your health needs are our priority.  As part of our continuing mission to provide you with exceptional heart care, we have created designated Provider Care Teams.  These Care Teams include your primary Cardiologist (physician) and Advanced Practice Providers (APPs -  Physician Assistants and Nurse Practitioners) who all work together to provide you with the care you need, when you need it. You will need a follow up appointment in 3 months.  Please call our office 2 months in advance to schedule this appointment.  You may see Jonathan Berry, MD or one of the following Advanced Practice Providers on your designated Care Team:   Luke Kilroy, PA-C Krista Kroeger, PA-C . Callie Goodrich, PA-C  Any Other Special Instructions Will Be Listed Below (If Applicable).   

## 2019-05-05 NOTE — Assessment & Plan Note (Signed)
S/P LCFA PTA 03/30/2019 followed by Lt 4th toe amputation 04/08/2019

## 2019-05-05 NOTE — Assessment & Plan Note (Signed)
CABG x 5 2016. Myoview low risk 2016

## 2019-05-05 NOTE — Assessment & Plan Note (Signed)
EF 30-35% by echo 2016- repeat echo June 2020- similar EF but now with LVT

## 2019-05-11 ENCOUNTER — Other Ambulatory Visit: Payer: Self-pay

## 2019-05-11 ENCOUNTER — Ambulatory Visit (INDEPENDENT_AMBULATORY_CARE_PROVIDER_SITE_OTHER): Payer: Self-pay | Admitting: *Deleted

## 2019-05-11 DIAGNOSIS — I513 Intracardiac thrombosis, not elsewhere classified: Secondary | ICD-10-CM

## 2019-05-11 DIAGNOSIS — I240 Acute coronary thrombosis not resulting in myocardial infarction: Secondary | ICD-10-CM

## 2019-05-11 DIAGNOSIS — Z7901 Long term (current) use of anticoagulants: Secondary | ICD-10-CM

## 2019-05-11 LAB — POCT INR: INR: 1.8 — AB (ref 2.0–3.0)

## 2019-05-11 NOTE — Patient Instructions (Signed)
Description   Today take an extra 1/2 tablet, then start taking  1 tablet daily except for 1 and 1/2 tablets on Mondays, Wednesdays  and Fridays.  Recheck INR in 1 week. Call with bleeding problems or medication changes [phone].

## 2019-05-14 ENCOUNTER — Telehealth: Payer: Self-pay

## 2019-05-14 NOTE — Telephone Encounter (Signed)
Interpreter called stated no answer and no option for voicemail

## 2019-06-04 ENCOUNTER — Other Ambulatory Visit: Payer: Self-pay

## 2019-06-04 ENCOUNTER — Ambulatory Visit (INDEPENDENT_AMBULATORY_CARE_PROVIDER_SITE_OTHER): Payer: Self-pay | Admitting: Orthopedic Surgery

## 2019-06-04 ENCOUNTER — Encounter: Payer: Self-pay | Admitting: Orthopedic Surgery

## 2019-06-04 VITALS — Ht 67.0 in | Wt 163.0 lb

## 2019-06-04 DIAGNOSIS — Z89422 Acquired absence of other left toe(s): Secondary | ICD-10-CM

## 2019-06-08 ENCOUNTER — Telehealth: Payer: Self-pay

## 2019-06-08 NOTE — Telephone Encounter (Signed)
Interpreter called the pt and stated that it seemed as if the pt picked up the phone and hung it up really fast and they were not able to lmom the pt regarding overdue inr

## 2019-06-11 ENCOUNTER — Telehealth: Payer: Self-pay

## 2019-06-11 NOTE — Telephone Encounter (Signed)
Unable to lmom for prescreen voicemailbox is full

## 2019-06-22 ENCOUNTER — Encounter: Payer: Self-pay | Admitting: Orthopedic Surgery

## 2019-06-22 NOTE — Progress Notes (Signed)
Office Visit Note   Patient: Isaac Ray           Date of Birth: January 18, 1971           MRN: [id number] Visit Date: 06/04/2019              Requested by: Lizbeth Bark, FNP [address] [place],  [place] [address] PCP: Lizbeth Bark, FNP  Chief Complaint  Patient presents with  . Left Foot - Routine Post Op    04/08/2019 left foot 4th ray      HPI: Patient is a 48 year old gentleman who presents 2 months status post left foot fourth toe amputation.  He states he has no concerns or issues he feels like it has healed well denies any redness or swelling.  Assessment & Plan: Visit Diagnoses:  1. Status post amputation of lesser toe of left foot (HCC)     Plan: Patient will advance to regular shoewear no restrictions.  Follow-Up Instructions: Return if symptoms worsen or fail to improve.   Ortho Exam  Patient is alert, oriented, no adenopathy, well-dressed, normal affect, normal respiratory effort. Examination patient has good dorsalis pedis pulse the incision is well-healed there is no cellulitis no signs of infection.  Imaging: No results found. No images are attached to the encounter.  Labs: Lab Results  Component Value Date   HGBA1C 6.5 (A) 04/02/2019   HGBA1C 6.8 02/28/2017   HGBA1C 8.6 12/05/2016     Lab Results  Component Value Date   ALBUMIN 3.0 (L) 04/27/2019   ALBUMIN 3.0 (L) 04/26/2019   ALBUMIN 3.9 04/25/2019    Lab Results  Component Value Date   MG 1.8 02/17/2015   MG 2.0 02/17/2015   MG 2.6 (H) 02/16/2015   No results found for: VD25OH  No results found for: PREALBUMIN CBC EXTENDED Latest Ref Rng & Units 04/27/2019 04/26/2019 04/25/2019  WBC 4.0 - 10.5 K/uL 11.9(H) 11.8(H) 11.8(H)  RBC 4.22 - 5.81 MIL/uL 4.15(L) 3.73(L) 4.30  HGB 13.0 - 17.0 g/dL 12.9(L) 11.8(L) 13.4  HCT 39.0 - 52.0 % 38.9(L) 34.8(L) 40.8  PLT 150 - 400 K/uL 134(L) 126(L) 164  NEUTROABS 1.7 - 7.7 K/uL - - -  LYMPHSABS 0.7 - 4.0 K/uL - - -      Body mass index is 25.53 kg/m.  Orders:  No orders of the defined types were placed in this encounter.  No orders of the defined types were placed in this encounter.    Procedures: No procedures performed  Clinical Data: No additional findings.  ROS:  All other systems negative, except as noted in the HPI. Review of Systems  Objective: Vital Signs: Ht 5\' 7"  (1.702 m)   Wt 163 lb (73.9 kg)   BMI 25.53 kg/m   Specialty Comments:  No specialty comments available.  PMFS History: Patient Active Problem List   Diagnosis Date Noted  . Long term (current) use of anticoagulants 04/30/2019  . LV (left ventricular) mural thrombus without MI 04/27/2019  . Acute appendicitis 04/25/2019  . S/P laparoscopic appendectomy 04/25/2019  . Gangrene of toe of left foot (HCC)   . Critical lower limb ischemia 03/30/2019  . Ischemic cardiomyopathy 03/24/2019  . Critical limb ischemia with history of revascularization of same extremity 03/24/2019  . Type 2 diabetes mellitus (HCC) 02/28/2017  . 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 Essential hypertension 04/20/2015  . Hyperlipidemia 04/20/2015  . Non-insulin dependent type 2 diabetes mellitus (HCC) 04/20/2015  .  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 NSTEMI (non-ST elevated myocardial infarction) ([place]) 02/12/2015   Past Medical History:  Diagnosis Date  . CAD (coronary artery disease)   . Essential hypertension   . Heart murmur    when I was a teenager- no mention of it now  . Hyperlipidemia   . LV (left ventricular) mural thrombus without MI 04/27/2019  . Type 2 diabetes mellitus (HCC)    Type II    Family History  Problem Relation Age of Onset  . Coronary artery disease Father        had CABG  . Heart disease Father     Past Surgical History:  Procedure Laterality Date  . AMPUTATION Left 04/08/2019   Procedure: LEFT FOOT 4TH TOE AMPUTATION;  Surgeon: Newt Minion, MD;  Location: [place];  Service:  Orthopedics;  Laterality: Left;  . CORONARY ANGIOPLASTY WITH STENT PLACEMENT  2013  . CORONARY ARTERY BYPASS GRAFT N/A 02/16/2015   Procedure: CORONARY ARTERY BYPASS GRAFTING (CABG) x5 using left internal mammary artery, right thigh greater saphenous vein, and left radial artery. ;  Surgeon: Melrose Nakayama, MD;  Location: [place];  Service: Open Heart Surgery;  Laterality: N/A;  . LAPAROSCOPIC APPENDECTOMY N/A 04/25/2019   Procedure: APPENDECTOMY LAPAROSCOPIC;  Surgeon: Kieth Brightly Arta Bruce, MD;  Location: [place];  Service: General;  Laterality: N/A;  . LEFT HEART CATHETERIZATION WITH CORONARY ANGIOGRAM N/A 02/14/2015   Procedure: LEFT HEART CATHETERIZATION WITH CORONARY ANGIOGRAM;  Surgeon: Lorretta Harp, MD;  Location: [facility] CATH LAB;  Service: Cardiovascular;  Laterality: N/A;  . LOWER EXTREMITY ANGIOGRAPHY N/A 03/30/2019   Procedure: LOWER EXTREMITY ANGIOGRAPHY;  Surgeon: Lorretta Harp, MD;  Location: [place] CV LAB;  Service: Cardiovascular;  Laterality: N/A;  . RADIAL ARTERY HARVEST Left 02/16/2015   Procedure: RADIAL ARTERY HARVEST;  Surgeon: Melrose Nakayama, MD;  Location: [place];  Service: Open Heart Surgery;  Laterality: Left;  . TEE WITHOUT CARDIOVERSION N/A 02/16/2015   Procedure: TRANSESOPHAGEAL ECHOCARDIOGRAM (TEE);  Surgeon: Melrose Nakayama, MD;  Location: [place];  Service: Open Heart Surgery;  Laterality: N/A;   Social History   Occupational History  . Not on file  Tobacco Use  . Smoking status: Former Smoker    Years: 30.00    Quit date: 02/16/2015    Years since quitting: 4.3  . Smokeless tobacco: Never Used  Substance and Sexual Activity  . Alcohol use: No    Alcohol/week: 0.0 standard drinks  . Drug use: No  . Sexual activity: Not on file

## 2019-06-23 ENCOUNTER — Ambulatory Visit: Payer: Self-pay | Admitting: Cardiovascular Disease

## 2019-07-03 ENCOUNTER — Encounter (HOSPITAL_COMMUNITY): Payer: Self-pay | Admitting: *Deleted

## 2019-07-03 ENCOUNTER — Other Ambulatory Visit: Payer: Self-pay

## 2019-07-03 ENCOUNTER — Telehealth: Payer: Self-pay | Admitting: Family Medicine

## 2019-07-03 ENCOUNTER — Emergency Department (HOSPITAL_COMMUNITY)
Admission: EM | Admit: 2019-07-03 | Discharge: 2019-07-03 | Disposition: A | Discharge status: Home / Self Care | Payer: Self-pay | Attending: Emergency Medicine | Admitting: Emergency Medicine

## 2019-07-03 DIAGNOSIS — Z79899 Other long term (current) drug therapy: Secondary | ICD-10-CM | POA: Insufficient documentation

## 2019-07-03 DIAGNOSIS — Z7901 Long term (current) use of anticoagulants: Secondary | ICD-10-CM | POA: Insufficient documentation

## 2019-07-03 DIAGNOSIS — Y998 Other external cause status: Secondary | ICD-10-CM | POA: Insufficient documentation

## 2019-07-03 DIAGNOSIS — Z7984 Long term (current) use of oral hypoglycemic drugs: Secondary | ICD-10-CM | POA: Insufficient documentation

## 2019-07-03 DIAGNOSIS — Y9389 Activity, other specified: Secondary | ICD-10-CM | POA: Insufficient documentation

## 2019-07-03 DIAGNOSIS — E119 Type 2 diabetes mellitus without complications: Secondary | ICD-10-CM | POA: Insufficient documentation

## 2019-07-03 DIAGNOSIS — Z87891 Personal history of nicotine dependence: Secondary | ICD-10-CM | POA: Insufficient documentation

## 2019-07-03 DIAGNOSIS — Z951 Presence of aortocoronary bypass graft: Secondary | ICD-10-CM | POA: Insufficient documentation

## 2019-07-03 DIAGNOSIS — T1592XA Foreign body on external eye, part unspecified, left eye, initial encounter: Secondary | ICD-10-CM | POA: Insufficient documentation

## 2019-07-03 DIAGNOSIS — X58XXXA Exposure to other specified factors, initial encounter: Secondary | ICD-10-CM | POA: Insufficient documentation

## 2019-07-03 DIAGNOSIS — Y929 Unspecified place or not applicable: Secondary | ICD-10-CM | POA: Insufficient documentation

## 2019-07-03 DIAGNOSIS — I2510 Atherosclerotic heart disease of native coronary artery without angina pectoris: Secondary | ICD-10-CM | POA: Insufficient documentation

## 2019-07-03 DIAGNOSIS — I252 Old myocardial infarction: Secondary | ICD-10-CM | POA: Insufficient documentation

## 2019-07-03 DIAGNOSIS — Z23 Encounter for immunization: Secondary | ICD-10-CM | POA: Insufficient documentation

## 2019-07-03 DIAGNOSIS — I10 Essential (primary) hypertension: Secondary | ICD-10-CM | POA: Insufficient documentation

## 2019-07-03 MED ORDER — TETRACAINE HCL 0.5 % OP SOLN
2.0000 [drp] | Freq: Once | OPHTHALMIC | Status: AC
  Administered 2019-07-03: 10:00:00 2 [drp] via OPHTHALMIC
  Filled 2019-07-03: qty 4

## 2019-07-03 MED ORDER — FLUORESCEIN SODIUM 1 MG OP STRP
1.0000 | ORAL_STRIP | Freq: Once | OPHTHALMIC | Status: AC
  Administered 2019-07-03: 1 via OPHTHALMIC
  Filled 2019-07-03: qty 1

## 2019-07-03 MED ORDER — TETANUS-DIPHTH-ACELL PERTUSSIS 5-2.5-18.5 LF-MCG/0.5 IM SUSP
0.5000 mL | Freq: Once | INTRAMUSCULAR | Status: AC
  Administered 2019-07-03: 11:00:00 0.5 mL via INTRAMUSCULAR
  Filled 2019-07-03: qty 0.5

## 2019-07-03 NOTE — ED Notes (Signed)
Pt dc'd home using spanish interpreter, pt verbalizes understanding to follow up at noon today with Dr. Kathlen Mody

## 2019-07-03 NOTE — Telephone Encounter (Signed)
1) Medication(s) Requested (by name): Atorvastatin Carvedilol Glipizide Metformin Clopidogrel IMDUR 2) Pharmacy of Choice: [facility] 3) Special Requests:   Approved medications will be sent to the pharmacy, we will reach out if there is an issue.  Requests made after 3pm may not be addressed until the following business day!  If a patient is unsure of the name of the medication(s) please note and ask patient to call back when they are able to provide all info, do not send to responsible party until all information is available!

## 2019-07-03 NOTE — ED Triage Notes (Signed)
Pt presents with Left eye pain x2 days

## 2019-07-03 NOTE — Discharge Instructions (Signed)
°  [address] [id number] [place] [address]

## 2019-07-03 NOTE — ED Provider Notes (Signed)
[facility] EMERGENCY DEPARTMENT Provider Note   CSN: [id number] Arrival date & time: 07/03/19  0803     History   Chief Complaint Chief Complaint  Patient presents with  . Eye Pain    HPI Isaac Ray is a 48 y.o. male.     47 year old male with complaint of foreign body in the left eye.  Patient states he was polishing something metal 2 days ago when he got a small piece of metal stuck in his left eye.  Patient does not wear glasses or contacts denies visual disturbance, reports pain in the eye.  Last tetanus is unknown.  No other complaints or concerns.     Past Medical History:  Diagnosis Date  . CAD (coronary artery disease)   . Essential hypertension   . Heart murmur    when I was a teenager- no mention of it now  . Hyperlipidemia   . LV (left ventricular) mural thrombus without MI 04/27/2019  . Type 2 diabetes mellitus ([place])    Type II    Patient Active Problem List   Diagnosis Date Noted  . Long term (current) use of anticoagulants 04/30/2019  . LV (left ventricular) mural thrombus without MI 04/27/2019  . Acute appendicitis 04/25/2019  . S/P laparoscopic appendectomy 04/25/2019  . Gangrene of toe of left foot ([place])   . Critical lower limb ischemia 03/30/2019  . Ischemic cardiomyopathy 03/24/2019  . Critical limb ischemia with history of revascularization of same extremity 03/24/2019  . Type 2 diabetes mellitus ([place]) 02/28/2017  . PAOD (peripheral arterial occlusive disease) ([place]) 02/08/2017  . Essential hypertension 04/20/2015  . Hyperlipidemia 04/20/2015  . Non-insulin dependent type 2 diabetes mellitus ([place]) 04/20/2015  . 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 NSTEMI (non-ST elevated myocardial infarction) ([place]) 02/12/2015    Past Surgical History:  Procedure Laterality Date  . AMPUTATION Left 04/08/2019   Procedure: LEFT FOOT 4TH TOE AMPUTATION;  Surgeon: Newt Minion, MD;  Location: [facility];  Service: Orthopedics;  Laterality:  Left;  . CORONARY ANGIOPLASTY WITH STENT PLACEMENT  2013  . CORONARY ARTERY BYPASS GRAFT N/A 02/16/2015   Procedure: CORONARY ARTERY BYPASS GRAFTING (CABG) x5 using left internal mammary artery, right thigh greater saphenous vein, and left radial artery. ;  Surgeon: Melrose Nakayama, MD;  Location: [facility];  Service: Open Heart Surgery;  Laterality: N/A;  . LAPAROSCOPIC APPENDECTOMY N/A 04/25/2019   Procedure: APPENDECTOMY LAPAROSCOPIC;  Surgeon: Kieth Brightly Arta Bruce, MD;  Location: [facility];  Service: General;  Laterality: N/A;  . LEFT HEART CATHETERIZATION WITH CORONARY ANGIOGRAM N/A 02/14/2015   Procedure: LEFT HEART CATHETERIZATION WITH CORONARY ANGIOGRAM;  Surgeon: Lorretta Harp, MD;  Location: [facility] CATH LAB;  Service: Cardiovascular;  Laterality: N/A;  . LOWER EXTREMITY ANGIOGRAPHY N/A 03/30/2019   Procedure: LOWER EXTREMITY ANGIOGRAPHY;  Surgeon: Lorretta Harp, MD;  Location: [facility] CV LAB;  Service: Cardiovascular;  Laterality: N/A;  . RADIAL ARTERY HARVEST Left 02/16/2015   Procedure: RADIAL ARTERY HARVEST;  Surgeon: Melrose Nakayama, MD;  Location: [place] [facility];  Service: Open Heart Surgery;  Laterality: Left;  . TEE WITHOUT CARDIOVERSION N/A 02/16/2015   Procedure: TRANSESOPHAGEAL ECHOCARDIOGRAM (TEE);  Surgeon: Melrose Nakayama, MD;  Location: [place];  Service: Open Heart Surgery;  Laterality: N/A;        Home Medications    Prior to Admission medications   Medication Sig Start Date End Date Taking? Authorizing Provider  acetaminophen (TYLENOL) 325 MG tablet Take 2  tablets (650 mg total) by mouth every 6 (six) hours as needed for mild pain (or temp > 100). 04/27/19   Meuth, Brooke A, PA-C  atorvastatin (LIPITOR) 80 MG tablet Take 1 tablet (80 mg total) by mouth daily. 03/27/19   Runell Gess, MD  carvedilol (COREG) 3.125 MG tablet Take 1 tablet (3.125 mg total) by mouth 2 (two) times daily with a meal. 03/27/19   Runell Gess, MD  clopidogrel (PLAVIX) 75 MG tablet Take  1 tablet (75 mg total) by mouth daily with breakfast. 03/31/19   Georgie Chard D, NP  glipiZIDE (GLUCOTROL XL) 2.5 MG 24 hr tablet Take 1 tablet (2.5 mg total) by mouth daily with breakfast. 02/28/17   Lizbeth Bark, FNP  isosorbide mononitrate (IMDUR) 30 MG 24 hr tablet Take 1 tablet (30 mg total) by mouth daily. 03/27/19   Runell Gess, MD  lisinopril (ZESTRIL) 10 MG tablet Take 1 tablet (10 mg total) by mouth daily. 03/27/19   Runell Gess, MD  metFORMIN (GLUCOPHAGE) 1000 MG tablet Take 1 tablet (1,000 mg total) by mouth 2 (two) times daily with a meal. 04/02/19   Grayce Sessions, NP  oxyCODONE (OXY IR/ROXICODONE) 5 MG immediate release tablet Take 1 tablet (5 mg total) by mouth every 6 (six) hours as needed for severe pain. 04/27/19   Meuth, Brooke A, PA-C  warfarin (COUMADIN) 5 MG tablet Take 1 tablet (5 mg total) by mouth daily. Take as directed, start with 1 tab daily 04/27/19 04/26/20  Barrett, Joline Salt, PA-C    Family History Family History  Problem Relation Age of Onset  . Coronary artery disease Father        had CABG  . Heart disease Father     Social History Social History   Tobacco Use  . Smoking status: Former Smoker    Years: 30.00    Quit date: 02/16/2015    Years since quitting: 4.3  . Smokeless tobacco: Never Used  Substance Use Topics  . Alcohol use: No    Alcohol/week: 0.0 standard drinks  . Drug use: No     Allergies   Patient has no known allergies.   Review of Systems Review of Systems  Constitutional: Negative for fever.  Eyes: Positive for pain. Negative for discharge and visual disturbance.  Skin: Negative for rash and wound.  Allergic/Immunologic: Negative for immunocompromised state.  All other systems reviewed and are negative.    Physical Exam Updated Vital Signs BP 134/73   Pulse (!) 54   Temp 98.3 F (36.8 C) (Oral)   Resp 18   Ht 5\' 7"  (1.702 m)   Wt 72.6 kg   SpO2 98%   BMI 25.06 kg/m   Physical Exam Vitals  signs and nursing note reviewed.  Constitutional:      General: He is not in acute distress.    Appearance: He is well-developed. He is not diaphoretic.  HENT:     Head: Normocephalic and atraumatic.  Eyes:     General:        Right eye: No discharge.        Left eye: No discharge.     Extraocular Movements: Extraocular movements intact.     Conjunctiva/sclera: Conjunctivae normal.     Pupils: Pupils are equal, round, and reactive to light.     Right eye: Fluorescein uptake present. Seidel exam negative.   Pulmonary:     Effort: Pulmonary effort is normal.  Neurological:  Mental Status: He is alert and oriented to person, place, and time.  Psychiatric:        Behavior: Behavior normal.      ED Treatments / Results  Labs (all labs ordered are listed, but only abnormal results are displayed) Labs Reviewed - No data to display  EKG None  Radiology No results found.  Procedures Procedures (including critical care time)  Medications Ordered in ED Medications  Tdap (BOOSTRIX) injection 0.5 mL (has no administration in time range)  fluorescein ophthalmic strip 1 strip (1 strip Both Eyes Given 07/03/19 1009)  tetracaine (PONTOCAINE) 0.5 % ophthalmic solution 2 drop (2 drops Both Eyes Given 07/03/19 1008)     Initial Impression / Assessment and Plan / ED Course  I have reviewed the triage vital signs and the nursing notes.  Pertinent labs & imaging results that were available during my care of the patient were reviewed by me and considered in my medical decision making (see chart for details).  Clinical Course as of Jul 02 1114  Fri Jul 03, 2019  [id number] 48 year old male with metallic foreign body in the left cornea without surrounding rust ring.  Unable to remove with sterile Q-tip, patient has difficulty holding his eyes still.  Case discussed with Dr. Jeraldine LootsLockwood, ER attending, agrees with plan for consult with ophthalmology.  Tdap vaccine updated in the ER today.  Visual  acuity without significant findings, 20/20 bilateral, 20/25 left and right.   [LM]  1050 Case discussed with Dr. Alben SpittleWeaver with ophthalmology who will see the patient in his office at noon today.   [LM]    Clinical Course User Index [LM] Jeannie FendMurphy, Joann Jorge A, PA-C      Final Clinical Impressions(s) / ED Diagnoses   Final diagnoses:  None    ED Discharge Orders    None       Alden HippMurphy, Dejanae Helser A, PA-C 07/03/19 1115    Gerhard MunchLockwood, Robert, MD 07/03/19 1433

## 2019-07-03 NOTE — ED Triage Notes (Signed)
Pt states he was polishing something 2 days ago and he felt something go into his L eye.  No vision changes.

## 2019-07-07 ENCOUNTER — Other Ambulatory Visit (INDEPENDENT_AMBULATORY_CARE_PROVIDER_SITE_OTHER): Payer: Self-pay | Admitting: Primary Care

## 2019-07-07 DIAGNOSIS — I252 Old myocardial infarction: Secondary | ICD-10-CM

## 2019-07-07 DIAGNOSIS — E782 Mixed hyperlipidemia: Secondary | ICD-10-CM

## 2019-07-07 DIAGNOSIS — E1169 Type 2 diabetes mellitus with other specified complication: Secondary | ICD-10-CM

## 2019-07-07 DIAGNOSIS — E119 Type 2 diabetes mellitus without complications: Secondary | ICD-10-CM

## 2019-07-07 MED ORDER — METFORMIN HCL 1000 MG PO TABS: 1000.0000 mg | ORAL_TABLET | Freq: Two times a day (BID) | ORAL | 3 refills | Status: DC

## 2019-07-07 MED ORDER — GLIPIZIDE ER 2.5 MG PO TB24: 2.5000 mg | ORAL_TABLET | Freq: Every day | ORAL | 2 refills | Status: DC

## 2019-07-07 MED ORDER — ATORVASTATIN CALCIUM 80 MG PO TABS: 80.0000 mg | ORAL_TABLET | Freq: Every day | ORAL | 3 refills | Status: DC

## 2019-07-07 NOTE — Telephone Encounter (Signed)
FWD to PCP. Tempestt S Roberts, CMA  

## 2019-07-07 NOTE — Telephone Encounter (Signed)
Patient is followed by cardiologist those meds need to be filled by them diabetic medication will be sent in and cholesterol

## 2019-07-10 NOTE — Telephone Encounter (Signed)
Called patient with pacific interpreter [phone]) someone picked up twice and hung up. Nat Christen, CMA

## 2019-07-14 ENCOUNTER — Encounter: Payer: Self-pay | Admitting: Cardiovascular Disease

## 2019-07-14 ENCOUNTER — Ambulatory Visit (INDEPENDENT_AMBULATORY_CARE_PROVIDER_SITE_OTHER): Payer: Self-pay | Admitting: Cardiovascular Disease

## 2019-07-14 ENCOUNTER — Ambulatory Visit (INDEPENDENT_AMBULATORY_CARE_PROVIDER_SITE_OTHER): Payer: Self-pay | Admitting: Pharmacist

## 2019-07-14 ENCOUNTER — Other Ambulatory Visit: Payer: Self-pay

## 2019-07-14 DIAGNOSIS — Z7901 Long term (current) use of anticoagulants: Secondary | ICD-10-CM

## 2019-07-14 DIAGNOSIS — I779 Disorder of arteries and arterioles, unspecified: Secondary | ICD-10-CM

## 2019-07-14 DIAGNOSIS — I255 Ischemic cardiomyopathy: Secondary | ICD-10-CM

## 2019-07-14 DIAGNOSIS — Z951 Presence of aortocoronary bypass graft: Secondary | ICD-10-CM

## 2019-07-14 DIAGNOSIS — I513 Intracardiac thrombosis, not elsewhere classified: Secondary | ICD-10-CM

## 2019-07-14 DIAGNOSIS — E782 Mixed hyperlipidemia: Secondary | ICD-10-CM

## 2019-07-14 DIAGNOSIS — I10 Essential (primary) hypertension: Secondary | ICD-10-CM

## 2019-07-14 DIAGNOSIS — I240 Acute coronary thrombosis not resulting in myocardial infarction: Secondary | ICD-10-CM

## 2019-07-14 LAB — POCT INR: INR: 1 — AB (ref 2.0–3.0)

## 2019-07-14 MED ORDER — WARFARIN SODIUM 5 MG PO TABS: 5.0000 mg | ORAL_TABLET | Freq: Every day | ORAL | 0 refills | Status: DC

## 2019-07-14 NOTE — Patient Instructions (Addendum)
Medication Instructions:  Your physician recommends that you continue on your current medications as directed. Please refer to the Current Medication list given to you today.  YOUR COUMADIN PRESCRIPTION HAS BEEN SENT TO YOUR PHARMACY FOR REFILL. If you need a refill on your cardiac medications before your next appointment, please call your pharmacy.   Lab work: Your physician recommends that you return for lab work within 1 week: [place]  If you have labs (blood work) drawn today and your tests are completely normal, you will receive your results only by: Marland Kitchen MyChart Message (if you have MyChart) OR . A paper copy in the mail If you have any lab test that is abnormal or we need to change your treatment, we will call you to review the results.  Testing/Procedures: Your physician has requested that you have an echocardiogram. Echocardiography is a painless test that uses sound waves to create images of your heart. It provides your doctor with information about the size and shape of your heart and how well your heart's chambers and valves are working. This procedure takes approximately one hour. There are no restrictions for this procedure. LOCATION: [facility]Care at [facility]: [place], [place], [place] [address] DUE December 2020  Your physician has requested that you have a lower or upper extremity arterial duplex. This test is an ultrasound of the arteries in the legs or arms. It looks at arterial blood flow in the legs and arms. Allow one hour for Lower and Upper Arterial scans. There are no restrictions or special instructions DUE December 2020  Your physician has requested that you have an ankle brachial index (ABI). During this test an ultrasound and blood pressure cuff are used to evaluate the arteries that supply the arms and legs with blood. Allow thirty minutes for this exam. There are no restrictions or special instructions. DUE December  2020   Follow-Up: At [facility], you and your health needs are our priority.  As part of our continuing mission to provide you with exceptional heart care, we have created designated Provider Care Teams.  These Care Teams include your primary Cardiologist (physician) and Advanced Practice Providers (APPs -  Physician Assistants and Nurse Practitioners) who all work together to provide you with the care you need, when you need it. . You will need a follow up appointment in 6 months with Kerin Ransom, PA-C and in 12 months with Dr. Quay Burow.  Please call our office 2 months in advance to schedule each appointment.

## 2019-07-14 NOTE — Assessment & Plan Note (Signed)
History of essential hypertension with blood pressure measured today 112/60.  He is on carvedilol and lisinopril.

## 2019-07-14 NOTE — Assessment & Plan Note (Signed)
History of hyperlipidemia on high-dose statin therapy.  Has not had a lipid profile performed in 2 years.  We will obtain a fasting lipid liver profile.

## 2019-07-14 NOTE — Assessment & Plan Note (Signed)
History of PAD with critical limb ischemia status post angiography by myself 03/30/2019 revealing a 99% left common femoral artery stenosis.  He underwent directional atherectomy followed by drug-coated balloon angioplasty reducing a 99% stenosis to less than 20% residual.  Did have one-vessel runoff on that side.  He also had a 90% mid right SFA stenosis with one-vessel runoff.  He denies claudication.  Follow-up lower extremity Doppler performed 04/30/2019 revealed a right ABI 0.85 and a left of 0.89 with a widely patent left common femoral artery.  He subsequent underwent amputation of the left great toe by Dr. Sharol Given which healed nicely.

## 2019-07-14 NOTE — Assessment & Plan Note (Signed)
History of non-STEMI stenting at [facility] in [place] in 2013 ultimately undergoing CABG x5 by Dr. Roxan Hockey 2016 after a radial diagnostic cath performed by myself revealed severe three-vessel disease.  He had a Myoview stress test performed 09/17/2018 which was negative.  He denies chest pain or shortness of breath.

## 2019-07-14 NOTE — Progress Notes (Signed)
07/14/2019 Isaac Ray   Feb 16, 1971  [id number]  Primary Physician Lizbeth BarkHairston, Mandesia R, FNP Primary Cardiologist: Runell GessJonathan J Otoniel Myhand MD Nicholes CalamityFACP, FACC, FAHA, [place]  HPI:  Isaac Ray is a 48 y.o.  married Latino male with a prior history of CAD status post stenting in 2013 at [facility] [place].  I last saw him in the office 03/24/2019.  He has a history of treated hypertension, diabetes hyperlipidemia and continued tobacco abuse. He was admitted with acute coronary syndrome and had minimally elevated enzymes with nonspecific ST and T-wave changes. I performed a diagnostic coronary arteriography via the right radial approach revealing three-vessel disease with severe LV dysfunction. 2 days later he underwent coronary artery bypass grafting by Dr. Dorris FetchHendrickson with 5 grafts placed including a LIMA to the LAD, sequential vein to OM1 and 2 and sequential left radial to diagonal branch 1 and 2. He did apparently stop smoking. A month after discharge he developed nocturnal chest pressure similar to his preintervention symptoms.  He had a normal 2D echo 09/17/2018 and Myoview stress test as well.  He stopped smoking 2 years ago.  He returned from [place] several months ago where he was since I last saw him because of a painful infected left fourth toe.  He was seen in the emergency room for this 01/21/2019.  He has been seeing Dr. Leanord Hawkingobson as well as the wound care center.  He apparently had IM antibiotics in [place] for this prior to return to [place].  He had lower extremity arterial Doppler studies performed in our office 02/10/2019 revealing a right ABI 0.72 and a left ABI 0.64 left fourth toe pressure was abnormal.  He does complain of claudication left greater than right for the last year.  Because of critical limb ischemia and the wound on his left foot he underwent angiography by myself backslash 1/20 revealing 99% stenosis in his left common femoral  artery which underwent Hawk 1 directional atherectomy followed by drug-coated balloon angioplasty with excellent result.  He did have one-vessel runoff via an anterior tibial artery.  His follow-up Dopplers performed on 04/30/2019 revealed marked improvement with left ABI of 29 widely patent common femoral.  Ultimately underwent successful amputation by Dr. Lajoyce Cornersuda of his gangrenous toe which has since healed.  He denies chest pain or shortness of breath.  He was noted to have a filling defect in his LV apex which was mural thrombus by [facility] imaging.  Placed on Coumadin anticoagulation however he failed to renew the prescription.   Current Meds  Medication Sig  . acetaminophen (TYLENOL) 325 MG tablet Take 2 tablets (650 mg total) by mouth every 6 (six) hours as needed for mild pain (or temp > 100).  Marland Kitchen. atorvastatin (LIPITOR) 80 MG tablet Take 1 tablet (80 mg total) by mouth daily.  . carvedilol (COREG) 3.125 MG tablet Take 1 tablet (3.125 mg total) by mouth 2 (two) times daily with a meal.  . clopidogrel (PLAVIX) 75 MG tablet Take 1 tablet (75 mg total) by mouth daily with breakfast.  . glipiZIDE (GLUCOTROL XL) 2.5 MG 24 hr tablet Take 1 tablet (2.5 mg total) by mouth daily with breakfast.  . isosorbide mononitrate (IMDUR) 30 MG 24 hr tablet Take 1 tablet (30 mg total) by mouth daily.  Marland Kitchen. lisinopril (ZESTRIL) 10 MG tablet Take 1 tablet (10 mg total) by mouth daily.  . metFORMIN (GLUCOPHAGE) 1000 MG tablet Take 1 tablet (1,000 mg total) by mouth 2 (  two) times daily with a meal.  . oxyCODONE (OXY IR/ROXICODONE) 5 MG immediate release tablet Take 1 tablet (5 mg total) by mouth every 6 (six) hours as needed for severe pain.  Marland Kitchen warfarin (COUMADIN) 5 MG tablet Take 1 tablet (5 mg total) by mouth daily. Take as directed, start with 1 tab daily     No Known Allergies  Social History   Socioeconomic History  . Marital status: Single    Spouse name: Not on file  . Number of children: Not on file  . Years  of education: Not on file  . Highest education level: Not on file  Occupational History  . Not on file  Social Needs  . Financial resource strain: Not on file  . Food insecurity    Worry: Not on file    Inability: Not on file  . Transportation needs    Medical: Not on file    Non-medical: Not on file  Tobacco Use  . Smoking status: Former Smoker    Years: 30.00    Quit date: 02/16/2015    Years since quitting: 4.4  . Smokeless tobacco: Never Used  Substance and Sexual Activity  . Alcohol use: No    Alcohol/week: 0.0 standard drinks  . Drug use: No  . Sexual activity: Not on file  Lifestyle  . Physical activity    Days per week: Not on file    Minutes per session: Not on file  . Stress: Not on file  Relationships  . Social Herbalist on phone: Not on file    Gets together: Not on file    Attends religious service: Not on file    Active member of club or organization: Not on file    Attends meetings of clubs or organizations: Not on file    Relationship status: Not on file  . Intimate partner violence    Fear of current or ex partner: Not on file    Emotionally abused: Not on file    Physically abused: Not on file    Forced sexual activity: Not on file  Other Topics Concern  . Not on file  Social History Narrative  . Not on file     Review of Systems: General: negative for chills, fever, night sweats or weight changes.  Cardiovascular: negative for chest pain, dyspnea on exertion, edema, orthopnea, palpitations, paroxysmal nocturnal dyspnea or shortness of breath Dermatological: negative for rash Respiratory: negative for cough or wheezing Urologic: negative for hematuria Abdominal: negative for nausea, vomiting, diarrhea, bright red blood per rectum, melena, or hematemesis Neurologic: negative for visual changes, syncope, or dizziness All other systems reviewed and are otherwise negative except as noted above.    Blood pressure 112/60, pulse 61,  temperature 97.6 F (36.4 C), height 5\' 7"  (1.702 m), weight 168 lb (76.2 kg).  General appearance: alert and no distress Neck: no adenopathy, no carotid bruit, no JVD, supple, symmetrical, trachea midline and thyroid not enlarged, symmetric, no tenderness/mass/nodules Lungs: clear to auscultation bilaterally Heart: regular rate and rhythm, S1, S2 normal, no murmur, click, rub or gallop Extremities: extremities normal, atraumatic, no cyanosis or edema Pulses: 2+ and symmetric Skin: Skin color, texture, turgor normal. No rashes or lesions Neurologic: Alert and oriented X 3, normal strength and tone. Normal symmetric reflexes. Normal coordination and gait  EKG not performed today  ASSESSMENT AND PLAN:   S/P CABG x 5 History of non-STEMI stenting at [facility] in [place] in 2013  ultimately undergoing CABG x5 by Dr. Dorris Fetch 2016 after a radial diagnostic cath performed by myself revealed severe three-vessel disease.  He had a Myoview stress test performed 09/17/2018 which was negative.  He denies chest pain or shortness of breath.  Essential hypertension History of essential hypertension with blood pressure measured today 112/60.  He is on carvedilol and lisinopril.  Hyperlipidemia History of hyperlipidemia on high-dose statin therapy.  Has not had a lipid profile performed in 2 years.  We will obtain a fasting lipid liver profile.  PAOD (peripheral arterial occlusive disease) (HCC) History of PAD with critical limb ischemia status post angiography by myself 03/30/2019 revealing a 99% left common femoral artery stenosis.  He underwent directional atherectomy followed by drug-coated balloon angioplasty reducing a 99% stenosis to less than 20% residual.  Did have one-vessel runoff on that side.  He also had a 90% mid right SFA stenosis with one-vessel runoff.  He denies claudication.  Follow-up lower extremity Doppler performed 04/30/2019 revealed a right ABI 0.85 and a left of  0.89 with a widely patent left common femoral artery.  He subsequent underwent amputation of the left great toe by Dr. Lajoyce Corners which healed nicely.  Ischemic cardiomyopathy History of ischemic cardiomyopathy with an EF in the 30% range on carvedilol and lisinopril.  He denies chest pain or shortness of breath.  He did have an apical mural thrombus that was on Coumadin in the past however his drug ran out and he did not refill this.  We will reinitiate Coumadin anticoagulation.  Going to check 2D echo in 3 months with Definity contrast.      Runell Gess MD [facility], [facility] 07/14/2019 10:11 AM

## 2019-07-14 NOTE — Assessment & Plan Note (Signed)
History of ischemic cardiomyopathy with an EF in the 30% range on carvedilol and lisinopril.  He denies chest pain or shortness of breath.  He did have an apical mural thrombus that was on Coumadin in the past however his drug ran out and he did not refill this.  We will reinitiate Coumadin anticoagulation.  Going to check 2D echo in 3 months with Definity contrast.

## 2019-07-17 LAB — HEPATIC FUNCTION PANEL
ALT: 19 IU/L (ref 0–44)
AST: 18 IU/L (ref 0–40)
Albumin: 4.5 g/dL (ref 4.0–5.0)
Alkaline Phosphatase: 66 IU/L (ref 39–117)
Bilirubin Total: 0.2 mg/dL (ref 0.0–1.2)
Bilirubin, Direct: 0.07 mg/dL (ref 0.00–0.40)
Total Protein: 7.2 g/dL (ref 6.0–8.5)

## 2019-07-17 LAB — LIPID PANEL
Chol/HDL Ratio: 4 ratio (ref 0.0–5.0)
Cholesterol, Total: 113 mg/dL (ref 100–199)
HDL: 28 mg/dL — ABNORMAL LOW (ref >39)
LDL Chol Calc (NIH): 65 mg/dL (ref 0–99)
Triglycerides: 109 mg/dL (ref 0–149)
VLDL Cholesterol Cal: 20 mg/dL (ref 5–40)

## 2019-07-21 ENCOUNTER — Encounter: Payer: Self-pay | Admitting: *Deleted

## 2019-07-21 ENCOUNTER — Ambulatory Visit: Payer: Self-pay

## 2019-07-24 ENCOUNTER — Ambulatory Visit (INDEPENDENT_AMBULATORY_CARE_PROVIDER_SITE_OTHER): Payer: Self-pay | Admitting: Pharmacist

## 2019-07-24 ENCOUNTER — Other Ambulatory Visit: Payer: Self-pay

## 2019-07-24 DIAGNOSIS — I513 Intracardiac thrombosis, not elsewhere classified: Secondary | ICD-10-CM

## 2019-07-24 DIAGNOSIS — I240 Acute coronary thrombosis not resulting in myocardial infarction: Secondary | ICD-10-CM

## 2019-07-24 DIAGNOSIS — Z7901 Long term (current) use of anticoagulants: Secondary | ICD-10-CM

## 2019-07-24 LAB — POCT INR: INR: 1.9 — AB (ref 2.0–3.0)

## 2019-07-24 NOTE — Telephone Encounter (Signed)
Call placed to patient using pacific interpreter [phone]) left voicemail asking patient to return call to [facility] at [phone]. Nat Christen, CMA

## 2019-07-31 NOTE — Telephone Encounter (Signed)
Called patient using pacific interpreter [phone]) patient did not answer. Left voicemail to return call. Nat Christen, CMA

## 2019-08-05 ENCOUNTER — Ambulatory Visit (INDEPENDENT_AMBULATORY_CARE_PROVIDER_SITE_OTHER): Payer: Self-pay | Admitting: Pharmacist

## 2019-08-05 ENCOUNTER — Other Ambulatory Visit: Payer: Self-pay

## 2019-08-05 ENCOUNTER — Ambulatory Visit: Payer: Self-pay | Admitting: Cardiovascular Disease

## 2019-08-05 DIAGNOSIS — I240 Acute coronary thrombosis not resulting in myocardial infarction: Secondary | ICD-10-CM

## 2019-08-05 DIAGNOSIS — Z7901 Long term (current) use of anticoagulants: Secondary | ICD-10-CM

## 2019-08-05 LAB — POCT INR: INR: 3.2 — AB (ref 2.0–3.0)

## 2019-08-26 ENCOUNTER — Other Ambulatory Visit: Payer: Self-pay

## 2019-08-26 ENCOUNTER — Ambulatory Visit (INDEPENDENT_AMBULATORY_CARE_PROVIDER_SITE_OTHER): Payer: Self-pay | Admitting: Pharmacist

## 2019-08-26 DIAGNOSIS — Z7901 Long term (current) use of anticoagulants: Secondary | ICD-10-CM

## 2019-08-26 DIAGNOSIS — I240 Acute coronary thrombosis not resulting in myocardial infarction: Secondary | ICD-10-CM

## 2019-08-26 LAB — POCT INR: INR: 2.5 (ref 2.0–3.0)

## 2019-08-26 MED ORDER — WARFARIN SODIUM 5 MG PO TABS: 5.0000 mg | ORAL_TABLET | Freq: Every day | ORAL | 0 refills | Status: DC

## 2019-09-23 ENCOUNTER — Other Ambulatory Visit: Payer: Self-pay

## 2019-09-23 ENCOUNTER — Ambulatory Visit (INDEPENDENT_AMBULATORY_CARE_PROVIDER_SITE_OTHER): Payer: Self-pay | Admitting: Pharmacist

## 2019-09-23 DIAGNOSIS — I240 Acute coronary thrombosis not resulting in myocardial infarction: Secondary | ICD-10-CM

## 2019-09-23 DIAGNOSIS — R001 Bradycardia, unspecified: Secondary | ICD-10-CM

## 2019-09-23 DIAGNOSIS — Z7901 Long term (current) use of anticoagulants: Secondary | ICD-10-CM

## 2019-09-23 DIAGNOSIS — I252 Old myocardial infarction: Secondary | ICD-10-CM

## 2019-09-23 LAB — POCT INR: INR: 2 (ref 2.0–3.0)

## 2019-09-23 MED ORDER — CARVEDILOL 3.125 MG PO TABS: 3.1250 mg | ORAL_TABLET | Freq: Two times a day (BID) | ORAL | 3 refills | Status: DC

## 2019-09-23 MED ORDER — WARFARIN SODIUM 5 MG PO TABS: 5.0000 mg | ORAL_TABLET | Freq: Every day | ORAL | 1 refills | Status: DC

## 2019-09-23 MED ORDER — CLOPIDOGREL BISULFATE 75 MG PO TABS: 75.0000 mg | ORAL_TABLET | Freq: Every day | ORAL | 2 refills | Status: DC

## 2019-09-29 ENCOUNTER — Ambulatory Visit (HOSPITAL_COMMUNITY)
Admission: RE | Admit: 2019-09-29 | Payer: Self-pay | Source: Ambulatory Visit | Attending: Cardiovascular Disease | Admitting: Cardiovascular Disease

## 2019-10-13 ENCOUNTER — Other Ambulatory Visit (HOSPITAL_COMMUNITY): Payer: Self-pay

## 2019-10-15 ENCOUNTER — Encounter (HOSPITAL_COMMUNITY): Payer: Self-pay | Admitting: Cardiovascular Disease

## 2019-10-21 ENCOUNTER — Ambulatory Visit (INDEPENDENT_AMBULATORY_CARE_PROVIDER_SITE_OTHER): Payer: Self-pay | Admitting: Pharmacist Clinician (PhC)/ Clinical Pharmacy Specialist

## 2019-10-21 ENCOUNTER — Other Ambulatory Visit: Payer: Self-pay

## 2019-10-21 DIAGNOSIS — I240 Acute coronary thrombosis not resulting in myocardial infarction: Secondary | ICD-10-CM

## 2019-10-21 DIAGNOSIS — Z7901 Long term (current) use of anticoagulants: Secondary | ICD-10-CM

## 2019-10-21 LAB — POCT INR: INR: 2.9 (ref 2.0–3.0)

## 2019-10-21 MED ORDER — WARFARIN SODIUM 5 MG PO TABS: ORAL_TABLET | ORAL | 0 refills | Status: DC

## 2019-11-03 ENCOUNTER — Ambulatory Visit (HOSPITAL_COMMUNITY): Payer: Self-pay | Attending: Cardiology

## 2019-11-03 ENCOUNTER — Other Ambulatory Visit: Payer: Self-pay

## 2019-11-03 DIAGNOSIS — I255 Ischemic cardiomyopathy: Secondary | ICD-10-CM | POA: Insufficient documentation

## 2019-11-03 MED ORDER — PERFLUTREN LIPID MICROSPHERE
1.0000 mL | INTRAVENOUS | Status: AC | PRN
  Administered 2019-11-03: 2 mL via INTRAVENOUS

## 2019-11-13 ENCOUNTER — Ambulatory Visit (HOSPITAL_COMMUNITY)
Admission: RE | Admit: 2019-11-13 | Discharge: 2019-11-13 | Disposition: A | Discharge status: Home / Self Care | Payer: Self-pay | Source: Ambulatory Visit | Attending: Cardiovascular Disease | Admitting: Cardiovascular Disease

## 2019-11-13 ENCOUNTER — Other Ambulatory Visit (HOSPITAL_COMMUNITY): Payer: Self-pay | Admitting: Cardiovascular Disease

## 2019-11-13 ENCOUNTER — Other Ambulatory Visit: Payer: Self-pay

## 2019-11-13 DIAGNOSIS — I70229 Atherosclerosis of native arteries of extremities with rest pain, unspecified extremity: Secondary | ICD-10-CM

## 2019-11-13 DIAGNOSIS — I779 Disorder of arteries and arterioles, unspecified: Secondary | ICD-10-CM | POA: Insufficient documentation

## 2019-11-13 DIAGNOSIS — I998 Other disorder of circulatory system: Secondary | ICD-10-CM | POA: Insufficient documentation

## 2019-11-13 DIAGNOSIS — I739 Peripheral vascular disease, unspecified: Secondary | ICD-10-CM

## 2019-11-16 DIAGNOSIS — I779 Disorder of arteries and arterioles, unspecified: Secondary | ICD-10-CM

## 2019-11-17 ENCOUNTER — Ambulatory Visit: Payer: Self-pay | Admitting: Cardiovascular Disease

## 2019-11-24 ENCOUNTER — Ambulatory Visit: Payer: Self-pay | Admitting: Cardiovascular Disease

## 2019-12-02 ENCOUNTER — Ambulatory Visit (INDEPENDENT_AMBULATORY_CARE_PROVIDER_SITE_OTHER): Payer: Self-pay | Admitting: Pharmacist Clinician (PhC)/ Clinical Pharmacy Specialist

## 2019-12-02 ENCOUNTER — Other Ambulatory Visit: Payer: Self-pay

## 2019-12-02 DIAGNOSIS — I240 Acute coronary thrombosis not resulting in myocardial infarction: Secondary | ICD-10-CM

## 2019-12-02 DIAGNOSIS — Z7901 Long term (current) use of anticoagulants: Secondary | ICD-10-CM

## 2019-12-02 LAB — POCT INR: INR: 1.1 — AB (ref 2.0–3.0)

## 2019-12-04 ENCOUNTER — Ambulatory Visit (INDEPENDENT_AMBULATORY_CARE_PROVIDER_SITE_OTHER): Payer: Self-pay | Admitting: Cardiovascular Disease

## 2019-12-04 ENCOUNTER — Encounter: Payer: Self-pay | Admitting: Cardiovascular Disease

## 2019-12-04 ENCOUNTER — Other Ambulatory Visit: Payer: Self-pay

## 2019-12-04 DIAGNOSIS — Z951 Presence of aortocoronary bypass graft: Secondary | ICD-10-CM

## 2019-12-04 DIAGNOSIS — E782 Mixed hyperlipidemia: Secondary | ICD-10-CM

## 2019-12-04 DIAGNOSIS — I255 Ischemic cardiomyopathy: Secondary | ICD-10-CM

## 2019-12-04 DIAGNOSIS — I10 Essential (primary) hypertension: Secondary | ICD-10-CM

## 2019-12-04 DIAGNOSIS — I779 Disorder of arteries and arterioles, unspecified: Secondary | ICD-10-CM

## 2019-12-04 DIAGNOSIS — I240 Acute coronary thrombosis not resulting in myocardial infarction: Secondary | ICD-10-CM

## 2019-12-04 NOTE — Assessment & Plan Note (Signed)
History of critical limb ischemia status post angiography by myself 03/30/2019 revealing a 99% left common femoral artery stenosis.  He underwent directional atherectomy followed by drug-coated balloon angioplasty reducing a 99% stenosis to less than 20% residual.  He did have one-vessel runoff via an anterior tibial artery.  In addition, he had 90% mid right SFA stenosis with one-vessel runoff via an anterior tibial artery.  His wound on his left fourth toe ultimately required amputation by Dr. Lajoyce Corners which has healed nicely.  He denies claudication.  His most recent lower extremity arterial Doppler studies performed 05/06/2019 revealed a right ABI of 1.02 and a left of 0.93 with a widely patent left common femoral artery.

## 2019-12-04 NOTE — Assessment & Plan Note (Signed)
History of hyperlipidemia on high-dose statin therapy with lipid profile performed 07/17/2019 revealing total cholesterol 113, LDL of 84 and HDL of 28.

## 2019-12-04 NOTE — Assessment & Plan Note (Addendum)
History of essential hypertension with blood pressure measured today at 146/102.  Follow-up blood pressure was 132/68.  He is on low-dose carvedilol 3.125 mg p.o. twice daily in addition to lisinopril 10 mg once a day.

## 2019-12-04 NOTE — Patient Instructions (Addendum)
Medication Instructions:   If you need a refill on your cardiac medications before your next appointment, please call your pharmacy.   Lab work: NONE  Testing/Procedures: IN July 2021 Your physician has requested that you have a lower extremity arterial exercise duplex. During this test, exercise and ultrasound are used to evaluate arterial blood flow in the legs. Allow one hour for this exam. There are no restrictions or special instructions.  AND  Your physician has requested that you have an ankle brachial index (ABI). During this test an ultrasound and blood pressure cuff are used to evaluate the arteries that supply the arms and legs with blood. Allow thirty minutes for this exam. There are no restrictions or special instructions.  Follow-Up: At [facility], you and your health needs are our priority.  As part of our continuing mission to provide you with exceptional heart care, we have created designated Provider Care Teams.  These Care Teams include your primary Cardiologist (physician) and Advanced Practice Providers (APPs -  Physician Assistants and Nurse Practitioners) who all work together to provide you with the care you need, when you need it. You may see Nanetta Batty, MD or one of the following Advanced Practice Providers on your designated Care Team:    Corine Shelter, PA-C  [place], [place]  Edd Fabian, [place]  Your physician wants you to follow-up in: 6 months with Dr. Allyson Sabal  Any Other Special Instructions Will Be Listed Below (If Applicable). You have been referred to the heart failure clinic. They will be in contact. You have been referred to Dr. Royann Shivers for consideration of pacemaker placement.

## 2019-12-04 NOTE — Assessment & Plan Note (Addendum)
History of ischemic cardiomyopathy with an EF of 25 to 30% by recent 2D echocardiogram performed 11/03/2019.  His LV was globally hypokinetic and severely dilated.  He is on low-dose carvedilol because of bradycardia and lisinopril.  He would be a candidate for Entresto although I am not sure financially he can afford this.  I am going to refer him to the heart failure clinic for optimization of his medications.  He denies symptoms of heart failure and is probably class I.  I am going to refer him to Dr. Royann Shivers for consideration of ICD plantation for primary prevention.

## 2019-12-04 NOTE — Assessment & Plan Note (Signed)
History of CAD status post non-STEMI with stenting of [facility] in [place] in 2013 ultimately requiring CABG x5 by Dr. Dorris Fetch in 2016 after radial diagnostic cath performed by myself that showed severe LV dysfunction.  Myoview performed 09/17/2018 was nonischemic.  The patient denies chest pain or shortness of breath.

## 2019-12-04 NOTE — Progress Notes (Signed)
12/04/2019 Isaac Ray Isaac Ray   10-13-1971  [id number]  Primary Physician Lizbeth Bark, FNP Primary Cardiologist: Runell Gess MD Nicholes Calamity, [place]  HPI:  Isaac Ray Isaac Ray is a 49 y.o.  married Latino male with a prior history of CAD status post stenting in 2013 at [facility] in [place].I last saw him in the office  07/14/2019. A Spanish interpreter was present during the interview Clelia Schaumann , [phone]).  He has a history of treated hypertension, diabetes hyperlipidemia and continued tobacco abuse. He was admitted with acute coronary syndrome and had minimally elevated enzymes with nonspecific ST and T-wave changes. I performed a diagnostic coronary arteriography via the right radial approach revealing three-vessel disease with severe LV dysfunction. 2 days later he underwent coronary artery bypass grafting by Dr. Dorris Fetch with 5 grafts placed including a LIMA to the LAD, sequential vein to OM1 and 2 and sequential left radial to diagonal branch 1 and 2. He did apparently stop smoking. A month after discharge he developed nocturnal chest pressure similar to his preintervention symptoms.  He had a normal 2D echo 09/17/2018 and Myoview stress test as well. He stopped smoking 2 years ago. He returned from [place] several months ago where he was since I last saw him because of a painful infected left fourth toe. He was seen in the emergency room for this 01/21/2019. He has been seeing Dr. Leanord Hawking as well as the wound care center. He apparently had IM antibiotics in [place] for this prior to return to [place]. He had lower extremity arterial Doppler studies performed in our office 02/10/2019 revealing a right ABI 0.72 and a left ABI 0.64 left fourth toe pressure was abnormal. He does complain of claudication left greater than right for the last year.  Because of critical limb ischemia and the wound on his left foot he underwent angiography by  myself backslash 1/20 revealing 99% stenosis in his left common femoral artery which underwent Hawk 1 directional atherectomy followed by drug-coated balloon angioplasty with excellent result.  He did have one-vessel runoff via an anterior tibial artery.  His follow-up Dopplers performed on 04/30/2019 revealed marked improvement with left ABI of 29 widely patent common femoral.  Ultimately underwent successful amputation by Dr. Lajoyce Corners of his gangrenous toe which has since healed.  He denies chest pain or shortness of breath.  He was noted to have a filling defect in his LV apex which was mural thrombus by Definity imaging.  Placed on Coumadin anticoagulation however he failed to renew the prescription.  Since I saw him back in September he has done well.  He denies chest pain, shortness of breath or claudication.  He does not smoke anymore.  He is on warfarin and has his INRs checked regularly.  Lower extremity arterial Doppler studies performed in July showed mild decrease in left ABI but widely patent left common femoral artery.  His left fourth toe wound has healed nicely.  Recent 2D echo performed 11/02/1929 revealed global LV dysfunction with an EF of 25 to 30%, severe LV dilatation.  We did use Definity and this did not demonstrate an apical mural thrombus.    Current Meds  Medication Sig  . acetaminophen (TYLENOL) 325 MG tablet Take 2 tablets (650 mg total) by mouth every 6 (six) hours as needed for mild pain (or temp > 100).  Marland Kitchen atorvastatin (LIPITOR) 80 MG tablet Take 1 tablet (80 mg total) by mouth daily.  . carvedilol (COREG)  3.125 MG tablet Take 1 tablet (3.125 mg total) by mouth 2 (two) times daily with a meal.  . clopidogrel (PLAVIX) 75 MG tablet Take 1 tablet (75 mg total) by mouth daily with breakfast.  . glipiZIDE (GLUCOTROL XL) 2.5 MG 24 hr tablet Take 1 tablet (2.5 mg total) by mouth daily with breakfast.  . isosorbide mononitrate (IMDUR) 30 MG 24 hr tablet Take 1 tablet (30 mg total) by  mouth daily.  Marland Kitchen lisinopril (ZESTRIL) 10 MG tablet Take 1 tablet (10 mg total) by mouth daily.  . metFORMIN (GLUCOPHAGE) 1000 MG tablet Take 1 tablet (1,000 mg total) by mouth 2 (two) times daily with a meal.  . oxyCODONE (OXY IR/ROXICODONE) 5 MG immediate release tablet Take 1 tablet (5 mg total) by mouth every 6 (six) hours as needed for severe pain.  Marland Kitchen warfarin (COUMADIN) 5 MG tablet Tome 1 -1.5 pastillas cada dia.     No Known Allergies  Social History   Socioeconomic History  . Marital status: Single    Spouse name: Not on file  . Number of children: Not on file  . Years of education: Not on file  . Highest education level: Not on file  Occupational History  . Not on file  Tobacco Use  . Smoking status: Former Smoker    Years: 30.00    Quit date: 02/16/2015    Years since quitting: 4.8  . Smokeless tobacco: Never Used  Substance and Sexual Activity  . Alcohol use: No    Alcohol/week: 0.0 standard drinks  . Drug use: No  . Sexual activity: Not on file  Other Topics Concern  . Not on file  Social History Narrative  . Not on file   Social Determinants of Health   Financial Resource Strain:   . Difficulty of Paying Living Expenses: Not on file  Food Insecurity:   . Worried About Programme researcher, broadcasting/film/video in the Last Year: Not on file  . Ran Out of Food in the Last Year: Not on file  Transportation Needs:   . Lack of Transportation (Medical): Not on file  . Lack of Transportation (Non-Medical): Not on file  Physical Activity:   . Days of Exercise per Week: Not on file  . Minutes of Exercise per Session: Not on file  Stress:   . Feeling of Stress : Not on file  Social Connections:   . Frequency of Communication with Friends and Family: Not on file  . Frequency of Social Gatherings with Friends and Family: Not on file  . Attends Religious Services: Not on file  . Active Member of Clubs or Organizations: Not on file  . Attends Banker Meetings: Not on file  .  Marital Status: Not on file  Intimate Partner Violence:   . Fear of Current or Ex-Partner: Not on file  . Emotionally Abused: Not on file  . Physically Abused: Not on file  . Sexually Abused: Not on file     Review of Systems: General: negative for chills, fever, night sweats or weight changes.  Cardiovascular: negative for chest pain, dyspnea on exertion, edema, orthopnea, palpitations, paroxysmal nocturnal dyspnea or shortness of breath Dermatological: negative for rash Respiratory: negative for cough or wheezing Urologic: negative for hematuria Abdominal: negative for nausea, vomiting, diarrhea, bright red blood per rectum, melena, or hematemesis Neurologic: negative for visual changes, syncope, or dizziness All other systems reviewed and are otherwise negative except as noted above.    Blood pressure 132/68, pulse Marland Kitchen)  56, temperature 98.1 F (36.7 C), height 5\' 7"  (1.702 m), weight 181 lb (82.1 kg), SpO2 96 %.  General appearance: alert and no distress Neck: no adenopathy, no carotid bruit, no JVD, supple, symmetrical, trachea midline and thyroid not enlarged, symmetric, no tenderness/mass/nodules Lungs: clear to auscultation bilaterally Heart: regular rate and rhythm, S1, S2 normal, no murmur, click, rub or gallop Extremities: extremities normal, atraumatic, no cyanosis or edema Pulses: Diminished pedal pulses bilaterally Skin: Skin color, texture, turgor normal. No rashes or lesions Neurologic: Alert and oriented X 3, normal strength and tone. Normal symmetric reflexes. Normal coordination and gait  EKG sinus bradycardia with a nonspecific IVCD, LVH with repolarization changes and poor R wave progression.  I personally reviewed this EKG.  ASSESSMENT AND PLAN:   S/P CABG x 5 History of CAD status post non-STEMI with stenting of [facility] in [place] in 2013 ultimately requiring CABG x5 by Dr. Roxan Hockey in 2016 after radial diagnostic cath performed by myself that showed  severe LV dysfunction.  Myoview performed 09/17/2018 was nonischemic.  The patient denies chest pain or shortness of breath.  Essential hypertension History of essential hypertension with blood pressure measured today at 146/102.  Follow-up blood pressure was 132/68.  He is on low-dose carvedilol 3.125 mg p.o. twice daily in addition to lisinopril 10 mg once a day.    Hyperlipidemia History of hyperlipidemia on high-dose statin therapy with lipid profile performed 07/17/2019 revealing total cholesterol 113, LDL of 84 and HDL of 28.  PAOD (peripheral arterial occlusive disease) ([place]) History of critical limb ischemia status post angiography by myself 03/30/2019 revealing a 99% left common femoral artery stenosis.  He underwent directional atherectomy followed by drug-coated balloon angioplasty reducing a 99% stenosis to less than 20% residual.  He did have one-vessel runoff via an anterior tibial artery.  In addition, he had 90% mid right SFA stenosis with one-vessel runoff via an anterior tibial artery.  His wound on his left fourth toe ultimately required amputation by Dr. Sharol Given which has healed nicely.  He denies claudication.  His most recent lower extremity arterial Doppler studies performed 05/06/2019 revealed a right ABI of 1.02 and a left of 0.93 with a widely patent left common femoral artery.  Ischemic cardiomyopathy History of ischemic cardiomyopathy with an EF of 25 to 30% by recent 2D echocardiogram performed 11/03/2019.  His LV was globally hypokinetic and severely dilated.  He is on low-dose carvedilol because of bradycardia and lisinopril.  He would be a candidate for Entresto although I am not sure financially he can afford this.  I am going to refer him to the heart failure clinic for optimization of his medications.  He denies symptoms of heart failure and is probably class I.  I am going to refer him to Dr. Sallyanne Kuster for consideration of ICD plantation for primary prevention.  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  Recent 2D echo using Definity did not demonstrate this.      Lorretta Harp MD FACP,FACC,FAHA, [facility] 12/04/2019 8:20 AM

## 2019-12-04 NOTE — Assessment & Plan Note (Signed)
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  Recent 2D echo using Definity did not demonstrate this.

## 2019-12-17 ENCOUNTER — Encounter (HOSPITAL_COMMUNITY): Payer: Self-pay

## 2019-12-23 ENCOUNTER — Institutional Professional Consult (permissible substitution): Payer: Self-pay | Admitting: Cardiovascular Disease

## 2020-01-07 ENCOUNTER — Ambulatory Visit (HOSPITAL_COMMUNITY)
Admission: RE | Admit: 2020-01-07 | Discharge: 2020-01-07 | Disposition: A | Discharge status: Home / Self Care | Payer: Self-pay | Source: Ambulatory Visit | Attending: Cardiology | Admitting: Cardiology

## 2020-01-07 ENCOUNTER — Encounter (HOSPITAL_COMMUNITY): Payer: Self-pay | Admitting: Cardiology

## 2020-01-07 ENCOUNTER — Other Ambulatory Visit (HOSPITAL_COMMUNITY): Payer: Self-pay

## 2020-01-07 ENCOUNTER — Other Ambulatory Visit: Payer: Self-pay

## 2020-01-07 VITALS — BP 130/60 | HR 43 | Wt 179.4 lb

## 2020-01-07 DIAGNOSIS — Z951 Presence of aortocoronary bypass graft: Secondary | ICD-10-CM | POA: Insufficient documentation

## 2020-01-07 DIAGNOSIS — I252 Old myocardial infarction: Secondary | ICD-10-CM | POA: Insufficient documentation

## 2020-01-07 DIAGNOSIS — Z79899 Other long term (current) drug therapy: Secondary | ICD-10-CM | POA: Insufficient documentation

## 2020-01-07 DIAGNOSIS — E1151 Type 2 diabetes mellitus with diabetic peripheral angiopathy without gangrene: Secondary | ICD-10-CM | POA: Insufficient documentation

## 2020-01-07 DIAGNOSIS — Z7902 Long term (current) use of antithrombotics/antiplatelets: Secondary | ICD-10-CM | POA: Insufficient documentation

## 2020-01-07 DIAGNOSIS — Z7984 Long term (current) use of oral hypoglycemic drugs: Secondary | ICD-10-CM | POA: Insufficient documentation

## 2020-01-07 DIAGNOSIS — I110 Hypertensive heart disease with heart failure: Secondary | ICD-10-CM | POA: Insufficient documentation

## 2020-01-07 DIAGNOSIS — I255 Ischemic cardiomyopathy: Secondary | ICD-10-CM | POA: Insufficient documentation

## 2020-01-07 DIAGNOSIS — E785 Hyperlipidemia, unspecified: Secondary | ICD-10-CM | POA: Insufficient documentation

## 2020-01-07 DIAGNOSIS — Z8249 Family history of ischemic heart disease and other diseases of the circulatory system: Secondary | ICD-10-CM | POA: Insufficient documentation

## 2020-01-07 DIAGNOSIS — I5022 Chronic systolic (congestive) heart failure: Secondary | ICD-10-CM | POA: Insufficient documentation

## 2020-01-07 DIAGNOSIS — Z86718 Personal history of other venous thrombosis and embolism: Secondary | ICD-10-CM | POA: Insufficient documentation

## 2020-01-07 DIAGNOSIS — I2510 Atherosclerotic heart disease of native coronary artery without angina pectoris: Secondary | ICD-10-CM | POA: Insufficient documentation

## 2020-01-07 DIAGNOSIS — Z9862 Peripheral vascular angioplasty status: Secondary | ICD-10-CM | POA: Insufficient documentation

## 2020-01-07 DIAGNOSIS — Z7901 Long term (current) use of anticoagulants: Secondary | ICD-10-CM | POA: Insufficient documentation

## 2020-01-07 DIAGNOSIS — Z87891 Personal history of nicotine dependence: Secondary | ICD-10-CM | POA: Insufficient documentation

## 2020-01-07 LAB — BASIC METABOLIC PANEL
Anion gap: 8 (ref 5–15)
BUN: 10 mg/dL (ref 6–20)
CO2: 22 mmol/L (ref 22–32)
Calcium: 9.1 mg/dL (ref 8.9–10.3)
Chloride: 106 mmol/L (ref 98–111)
Creatinine, Ser: 0.87 mg/dL (ref 0.61–1.24)
GFR calc Af Amer: >60 mL/min (ref >60)
GFR calc non Af Amer: >60 mL/min (ref >60)
Glucose, Bld: 225 mg/dL — ABNORMAL HIGH (ref 70–99)
Potassium: 4.2 mmol/L (ref 3.5–5.1)
Sodium: 136 mmol/L (ref 135–145)

## 2020-01-07 MED ORDER — SPIRONOLACTONE 25 MG PO TABS: 12.5000 mg | ORAL_TABLET | Freq: Every day | ORAL | 5 refills | Status: DC

## 2020-01-07 MED ORDER — SACUBITRIL-VALSARTAN 24-26 MG PO TABS: 1.0000 | ORAL_TABLET | Freq: Two times a day (BID) | ORAL | 5 refills | Status: DC

## 2020-01-07 MED ORDER — SACUBITRIL-VALSARTAN 24-26 MG PO TABS: 1.0000 | ORAL_TABLET | Freq: Two times a day (BID) | ORAL | 3 refills | Status: DC

## 2020-01-07 NOTE — Patient Instructions (Addendum)
STOP Imdur (Isosorbide)  STOP Lisinopril  START Entresto 24/26mg  (1 tab) twice a day on Saturday morning  START Spironolactone 12.5mg  (1/2 tab) daily   Labs today and repeat in 10 days We will only contact you if something comes back abnormal or we need to make some changes. Otherwise no news is good news!   You have been referred to Electrophysiology for an ICD. They will call you to schedule an appointment.    Your physician has recommended that you have a defibrillator inserted. An implantable cardioverter defibrillator (ICD) is a small device that is placed in your chest or, in rare cases, your abdomen. This device uses electrical pulses or shocks to help control life-threatening, irregular heartbeats that could lead the heart to suddenly stop beating (sudden cardiac arrest). Leads are attached to the ICD that goes into your heart. This is done in the hospital and usually requires an overnight stay. Please see the instruction sheet given to you today for more information.   Your physician recommends that you schedule a follow-up appointment in: 3 weeks with the Pharmacist and 6 weeks with Dr Shirlee Latch   Please call office at [phone] option 2 if you have any questions or concerns.   At the [facility], you and your health needs are our priority. As part of our continuing mission to provide you with exceptional heart care, we have created designated Provider Care Teams. These Care Teams include your primary Cardiologist (physician) and Advanced Practice Providers (APPs- Physician Assistants and Nurse Practitioners) who all work together to provide you with the care you need, when you need it.   You may see any of the following providers on your designated Care Team at your next follow up: Marland Kitchen Dr Arvilla Meres . Dr Marca Ancona . Tonye Becket, NP . Robbie Lis, PA . Karle Plumber, PharmD   Please be sure to bring in all your medications bottles to every  appointment.

## 2020-01-07 NOTE — Progress Notes (Signed)
CSW consulted to speak with pt regarding current lack of insurance.  Pt reports he is a legal permanent [place] resident but has not obtained citizenship so would not be eligible for ongoing Medicaid.  CSW provided pt with application for CAFA and [employer] in [place] which can provide coverage for local appts and [place] specific bills.  CSW also helped pt complete Novartis application to assist with Entresto.  CSW will continue to follow and assist as needed  Burna Sis, LCSW Clinical Social Worker [facility] Desk#: [phone] Cell#: [phone]

## 2020-01-07 NOTE — Progress Notes (Signed)
PCP: Alfonse Spruce, FNP Cardiology: Dr. Gwenlyn Found HF Cardiology: Dr. Aundra Dubin  49 y.o. with history of HTN, DM2, PAD, CAD s/p CABG, ischemic cardiomyopathy, and LV thrombus was referred by Dr. Gwenlyn Found for evaluation of CHF.  Patient has a family history of CAD and is a former smoker.  He had CABG x 5 in 4/16.  He developed an ischemic cardiomyopathy, most recent echo in 1/21 with EF 25-30%.  He was found to have an LV thrombus in the past and is on warfarin. In 1/20, he was noted to have critical limb ischemia in the left leg.  He had PTCA to left CFA.  He ended up losing his left 2nd toe.   He is doing very well symptomatically.  He still works full time as a Dealer.  He no longer smokes.  No chest pain.  No significant exertional dyspnea.  No orthopnea/PND.  No further claudication since intervention on left SFA.  No palpitations.   ECG (personally reviewed): NSR, IVCD 136 msec, lateral and inferior Qs  Labs (6/20): K 3.8, creatinine 1.13 Labs (9/20): LDL 65, HDl 28  PMH: 1. HTN 2. Type 2 diabetes 3. Prior smoker 4. Hyperlipidemia 5. CAD: CABG in 4/16 with LIMA-LAD, seq SVG-OM1/2, sequential free radial - D1/2.   6. PAD: 1/20 with PTCA to left CFA.  Gangrenous left 2nd toe had to be amputated.  - ABIs (7/20): 1.02 on right, 0.93 on left.  7. Chronic systolic CHF: Ischemic cardiomyopathy.   - Echo (1/21) with EF 25-30%, severe LV dilation.  8. H/o LV thrombus: On warfarin.   Social History   Socioeconomic History  . Marital status: Single    Spouse name: Not on file  . Number of children: Not on file  . Years of education: Not on file  . Highest education level: Not on file  Occupational History  . Not on file  Tobacco Use  . Smoking status: Former Smoker    Years: 30.00    Quit date: 02/16/2015    Years since quitting: 4.8  . Smokeless tobacco: Never Used  Substance and Sexual Activity  . Alcohol use: No    Alcohol/week: 0.0 standard drinks  . Drug use: No  . Sexual  activity: Not on file  Other Topics Concern  . Not on file  Social History Narrative  . Not on file   Social Determinants of Health   Financial Resource Strain:   . Difficulty of Paying Living Expenses:   Food Insecurity:   . Worried About Charity fundraiser in the Last Year:   . Arboriculturist in the Last Year:   Transportation Needs:   . Film/video editor (Medical):   Marland Kitchen Lack of Transportation (Non-Medical):   Physical Activity:   . Days of Exercise per Week:   . Minutes of Exercise per Session:   Stress:   . Feeling of Stress :   Social Connections:   . Frequency of Communication with Friends and Family:   . Frequency of Social Gatherings with Friends and Family:   . Attends Religious Services:   . Active Member of Clubs or Organizations:   . Attends Archivist Meetings:   Marland Kitchen Marital Status:   Intimate Partner Violence:   . Fear of Current or Ex-Partner:   . Emotionally Abused:   Marland Kitchen Physically Abused:   . Sexually Abused:    Family History  Problem Relation Age of Onset  . Coronary artery disease Father  had CABG  . Heart disease Father     ROS: All systems reviewed and negative except as per HPI.   Current Outpatient Medications  Medication Sig Dispense Refill  . acetaminophen (TYLENOL) 325 MG tablet Take 2 tablets (650 mg total) by mouth every 6 (six) hours as needed for mild pain (or temp > 100).    Marland Kitchen atorvastatin (LIPITOR) 80 MG tablet Take 1 tablet (80 mg total) by mouth daily. 90 tablet 3  . carvedilol (COREG) 3.125 MG tablet Take 1 tablet (3.125 mg total) by mouth 2 (two) times daily with a meal. 180 tablet 3  . clopidogrel (PLAVIX) 75 MG tablet Take 1 tablet (75 mg total) by mouth daily with breakfast. 90 tablet 2  . glipiZIDE (GLUCOTROL XL) 2.5 MG 24 hr tablet Take 1 tablet (2.5 mg total) by mouth daily with breakfast. 90 tablet 2  . metFORMIN (GLUCOPHAGE) 1000 MG tablet Take 1 tablet (1,000 mg total) by mouth 2 (two) times daily with  a meal. 60 tablet 3  . warfarin (COUMADIN) 5 MG tablet Tome 1 -1.5 pastillas cada dia. 135 tablet 0  . sacubitril-valsartan (ENTRESTO) 24-26 MG Take 1 tablet by mouth 2 (two) times daily. 180 tablet 3  . spironolactone (ALDACTONE) 25 MG tablet Take 0.5 tablets (12.5 mg total) by mouth daily. 15 tablet 5   No current facility-administered medications for this encounter.   BP 130/60   Pulse (!) 43   Wt 81.4 kg (179 lb 6.4 oz)   SpO2 97%   BMI 28.10 kg/m  General: NAD Neck: No JVD, no thyromegaly or thyroid nodule.  Lungs: Clear to auscultation bilaterally with normal respiratory effort. CV: Nondisplaced PMI.  Heart regular S1/S2, no S3/S4, no murmur.  No peripheral edema.  No carotid bruit.  Normal pedal pulses.  Abdomen: Soft, nontender, no hepatosplenomegaly, no distention.  Skin: Intact without lesions or rashes.  Neurologic: Alert and oriented x 3.  Psych: Normal affect. Extremities: No clubbing or cyanosis.  HEENT: Normal.   Assessment/Plan: 1. CAD: S/p CABG x 5 in 4/16.  No chest pain.  - Continue atorvastatin 80 mg daily.  - He is on both Plavix and warfarin, think he went on Plavix after left CFA intervention in 1/20.  He probably can stop Plavix at this point but will check with Dr. Allyson Sabal.  - I think that he can stop Imdur.  2. Chronic systolic CHF: Ischemic cardiomyopathy.  Echo in 1/21 with EF 25-30%, dense scar from large anterior MI.  Minimally symptomatic, NYHA class I.  He is not volume overloaded on exam.  - Continue Coreg 3.125 mg bid.  - Stop lisinopril, in 36 hrs start Entresto 24/26 bid.  BMET today and again in 10 days.  - Start spironolactone 12.5 mg daily.  - I will refer to EP, ideally he would get an ICD.  Not CRT candidate.  3. PAD: s/p PTCA left CFA in 1/20.  No claudication.  4. HTN: BP controlled.  5. LV thrombus: He will continue warfarin.   Marca Ancona 01/07/2020

## 2020-01-08 ENCOUNTER — Other Ambulatory Visit (HOSPITAL_COMMUNITY): Payer: Self-pay | Admitting: Cardiology

## 2020-01-08 ENCOUNTER — Telehealth (HOSPITAL_COMMUNITY): Payer: Self-pay | Admitting: Licensed Clinical Social Worker

## 2020-01-08 MED ORDER — SPIRONOLACTONE 25 MG PO TABS: 12.5000 mg | ORAL_TABLET | Freq: Every day | ORAL | 5 refills | Status: DC

## 2020-01-08 MED ORDER — SACUBITRIL-VALSARTAN 24-26 MG PO TABS: 1.0000 | ORAL_TABLET | Freq: Two times a day (BID) | ORAL | 3 refills | Status: DC

## 2020-01-08 NOTE — Telephone Encounter (Signed)
Novartis application for patient assistance with Sherryll Burger completed and sent in for review on 01/07/20- fax confirmation received  Burna Sis, LCSW Clinical Social Worker [facility] Desk#: [phone] Cell#: [phone]

## 2020-01-08 NOTE — Telephone Encounter (Signed)
Pts caregiver called to request entresto and spiro to be sent another pharmacy as [facility] did not have either medications   As requested medications sent to [facility]

## 2020-01-11 ENCOUNTER — Encounter: Payer: Self-pay | Admitting: Cardiovascular Disease

## 2020-01-11 ENCOUNTER — Telehealth (HOSPITAL_COMMUNITY): Payer: Self-pay | Admitting: Pharmacist

## 2020-01-11 NOTE — Telephone Encounter (Addendum)
Advanced Heart Failure Patient Advocate Encounter   Patient was approved to receive Entresto from [employer]. Informed patient of decision using Spanish Interpreter service [phone]).  Patient ID: [id number] Effective dates: 01/08/20 through 01/07/21  Karle Plumber, PharmD, BCPS, BCCP, CPP Heart Failure Clinic Pharmacist [phone]

## 2020-01-12 ENCOUNTER — Telehealth (HOSPITAL_COMMUNITY): Payer: Self-pay | Admitting: Pharmacist

## 2020-01-12 MED ORDER — SPIRONOLACTONE 25 MG PO TABS: 12.5000 mg | ORAL_TABLET | Freq: Every day | ORAL | 5 refills | Status: DC

## 2020-01-12 NOTE — Telephone Encounter (Signed)
Sent spironolactone prescription to [facility] per patient request.  Karle Plumber, PharmD, BCPS, BCCP, CPP Heart Failure Clinic Pharmacist [phone]

## 2020-01-18 ENCOUNTER — Ambulatory Visit (HOSPITAL_COMMUNITY)
Admission: RE | Admit: 2020-01-18 | Discharge: 2020-01-18 | Disposition: A | Discharge status: Home / Self Care | Payer: Self-pay | Source: Ambulatory Visit | Attending: Cardiology | Admitting: Cardiology

## 2020-01-18 ENCOUNTER — Other Ambulatory Visit: Payer: Self-pay

## 2020-01-18 ENCOUNTER — Encounter: Payer: Self-pay | Admitting: Cardiovascular Disease

## 2020-01-18 DIAGNOSIS — I255 Ischemic cardiomyopathy: Secondary | ICD-10-CM | POA: Insufficient documentation

## 2020-01-18 LAB — BASIC METABOLIC PANEL
Anion gap: 12 (ref 5–15)
BUN: 12 mg/dL (ref 6–20)
CO2: 23 mmol/L (ref 22–32)
Calcium: 9.2 mg/dL (ref 8.9–10.3)
Chloride: 104 mmol/L (ref 98–111)
Creatinine, Ser: 1 mg/dL (ref 0.61–1.24)
GFR calc Af Amer: >60 mL/min (ref >60)
GFR calc non Af Amer: >60 mL/min (ref >60)
Glucose, Bld: 273 mg/dL — ABNORMAL HIGH (ref 70–99)
Potassium: 4.2 mmol/L (ref 3.5–5.1)
Sodium: 139 mmol/L (ref 135–145)

## 2020-01-28 ENCOUNTER — Inpatient Hospital Stay (HOSPITAL_COMMUNITY)
Admission: RE | Admit: 2020-01-28 | Discharge: 2020-01-28 | Disposition: A | Discharge status: Home / Self Care | Payer: Self-pay | Source: Ambulatory Visit

## 2020-03-02 ENCOUNTER — Encounter (HOSPITAL_COMMUNITY): Payer: Self-pay | Admitting: Cardiology

## 2020-03-25 ENCOUNTER — Telehealth: Payer: Self-pay | Admitting: Pharmacist

## 2020-03-25 NOTE — Telephone Encounter (Signed)
LMOM; patient to call back ans schedule appt (message left in Spanish)

## 2020-04-01 ENCOUNTER — Telehealth: Payer: Self-pay | Admitting: Pharmacist

## 2020-04-01 NOTE — Telephone Encounter (Signed)
F/u appr scheduled today for follow up INR on 6/18

## 2020-04-15 ENCOUNTER — Other Ambulatory Visit: Payer: Self-pay

## 2020-04-15 ENCOUNTER — Ambulatory Visit (INDEPENDENT_AMBULATORY_CARE_PROVIDER_SITE_OTHER): Payer: Self-pay | Admitting: Pharmacist

## 2020-04-15 DIAGNOSIS — I240 Acute coronary thrombosis not resulting in myocardial infarction: Secondary | ICD-10-CM

## 2020-04-15 DIAGNOSIS — Z7901 Long term (current) use of anticoagulants: Secondary | ICD-10-CM

## 2020-04-15 LAB — POCT INR: INR: 1.8 — AB (ref 2.0–3.0)

## 2020-05-10 ENCOUNTER — Ambulatory Visit (HOSPITAL_COMMUNITY)
Admission: RE | Admit: 2020-05-10 | Payer: MEDICAID | Source: Ambulatory Visit | Attending: Cardiovascular Disease | Admitting: Cardiovascular Disease

## 2020-05-10 ENCOUNTER — Encounter (HOSPITAL_COMMUNITY): Payer: Self-pay

## 2020-05-13 ENCOUNTER — Other Ambulatory Visit: Payer: Self-pay | Admitting: Cardiovascular Disease

## 2020-05-13 ENCOUNTER — Other Ambulatory Visit (INDEPENDENT_AMBULATORY_CARE_PROVIDER_SITE_OTHER): Payer: Self-pay | Admitting: Primary Care

## 2020-05-13 DIAGNOSIS — E119 Type 2 diabetes mellitus without complications: Secondary | ICD-10-CM

## 2020-05-13 NOTE — Telephone Encounter (Signed)
Rx(s) sent to pharmacy electronically.  

## 2020-05-13 NOTE — Telephone Encounter (Signed)
Requested medication (s) are due for refill today: yes Requested medication (s) are on the active medication list: yes  Last refill:  03/18/2020  Future visit scheduled:no  Notes to clinic:  patient is overdue for labs and follow up   Requested Prescriptions  Pending Prescriptions Disp Refills   metFORMIN (GLUCOPHAGE) 1000 MG tablet [Pharmacy Med Name: metFORMIN HCL 1000 MG TABS 1000 Tablet] 60 tablet 3    Sig: Take 1 tablet (1,000 mg total) by mouth 2 (two) times daily with a meal.      Endocrinology:  Diabetes - Biguanides Failed - 05/13/2020 10:10 AM      Failed - HBA1C is between 0 and 7.9 and within 180 days    Hemoglobin A1C  Date Value Ref Range Status  04/02/2019 6.5 (A) 4.0 - 5.6 % Final   Hgb A1c MFr Bld  Date Value Ref Range Status  02/13/2015 10.2 (H) 4.8 - 5.6 % Final    Comment:    (NOTE)         Pre-diabetes: 5.7 - 6.4         Diabetes: >6.4         Glycemic control for adults with diabetes: <7.0           Failed - Valid encounter within last 6 months    Recent Outpatient Visits           1 year ago [place], [place], [place]   1 year ago Type 2 diabetes mellitus without complication, without long-term current use of insulin ([place])   [place], [place] P, NP   3 years ago Type 2 diabetes mellitus without complication, without long-term current use of insulin ([place])   [place], [place] R, FNP   3 years ago Diabetes mellitus of other type with complication, unspecified long term insulin use status [facility])   [place], [place] K, RPH-CPP   3 years ago Essential hypertension   [place], [place], [place]              Passed - Cr in normal range and within 360 days    Creatinine, Ser  Date Value Ref Range Status  01/18/2020 1.00 0.61 - 1.24 mg/dL  Final   Creatinine, POC  Date Value Ref Range Status  02/28/2017 300 mg/dL Final          Passed - eGFR in normal range and within 360 days    GFR calc Af Amer  Date Value Ref Range Status  01/18/2020 >60 >60 mL/min Final   GFR calc non Af Amer  Date Value Ref Range Status  01/18/2020 >60 >60 mL/min Final

## 2020-05-17 ENCOUNTER — Other Ambulatory Visit: Payer: Self-pay | Admitting: Cardiovascular Disease

## 2020-05-17 NOTE — Telephone Encounter (Signed)
*  STAT* If patient is at the pharmacy, call can be transferred to refill team.   1. Which medications need to be refilled? (please list name of each medication and dose if known)   warfarin (COUMADIN) 5 MG tablet    2. Which pharmacy/location (including street and city if local pharmacy) is medication to be sent to?  [facility] - [place], [place] - [place] [address]  3. Do they need a 30 day or 90 day supply? 90 day supply

## 2020-05-17 NOTE — Telephone Encounter (Signed)
Unable to lmom for coumadin check will retry tomorrow

## 2020-05-18 ENCOUNTER — Other Ambulatory Visit: Payer: Self-pay | Admitting: Family Medicine

## 2020-05-18 ENCOUNTER — Other Ambulatory Visit: Payer: Self-pay

## 2020-05-18 ENCOUNTER — Ambulatory Visit: Payer: Self-pay | Attending: Family Medicine | Admitting: Family Medicine

## 2020-05-18 ENCOUNTER — Encounter: Payer: Self-pay | Admitting: Family Medicine

## 2020-05-18 ENCOUNTER — Other Ambulatory Visit: Payer: Self-pay | Admitting: Pharmacist

## 2020-05-18 VITALS — BP 124/68 | HR 52 | Ht 67.0 in | Wt 174.8 lb

## 2020-05-18 DIAGNOSIS — I779 Disorder of arteries and arterioles, unspecified: Secondary | ICD-10-CM

## 2020-05-18 DIAGNOSIS — I240 Acute coronary thrombosis not resulting in myocardial infarction: Secondary | ICD-10-CM

## 2020-05-18 DIAGNOSIS — I10 Essential (primary) hypertension: Secondary | ICD-10-CM

## 2020-05-18 DIAGNOSIS — I255 Ischemic cardiomyopathy: Secondary | ICD-10-CM

## 2020-05-18 DIAGNOSIS — Z951 Presence of aortocoronary bypass graft: Secondary | ICD-10-CM

## 2020-05-18 DIAGNOSIS — E1159 Type 2 diabetes mellitus with other circulatory complications: Secondary | ICD-10-CM

## 2020-05-18 LAB — GLUCOSE, POCT (MANUAL RESULT ENTRY): POC Glucose: 152 mg/dl — AB (ref 70–99)

## 2020-05-18 LAB — POCT GLYCOSYLATED HEMOGLOBIN (HGB A1C): HbA1c, POC (controlled diabetic range): 7.6 % — AB (ref 0.0–7.0)

## 2020-05-18 MED ORDER — GLIPIZIDE ER 2.5 MG PO TB24: 2.5000 mg | ORAL_TABLET | Freq: Every day | ORAL | 1 refills | Status: DC

## 2020-05-18 MED ORDER — EMPAGLIFLOZIN 10 MG PO TABS: 10.0000 mg | ORAL_TABLET | Freq: Every day | ORAL | 1 refills | Status: DC

## 2020-05-18 MED ORDER — METFORMIN HCL 1000 MG PO TABS: 1000.0000 mg | ORAL_TABLET | Freq: Two times a day (BID) | ORAL | 1 refills | Status: DC

## 2020-05-18 NOTE — Telephone Encounter (Signed)
Unable to lmom for overdue inr to schedule appt

## 2020-05-18 NOTE — Progress Notes (Signed)
Subjective:  Patient ID: Isaac Ray, male    DOB: [birth year]  Age: 49 y.o. MRN: [id number]  CC: Diabetes   HPI Isaac Ray is a 49 year old male with a history of type 2 diabetes mellitus (A1c 7.6), hypertension, coronary artery disease status post CABG x5, left ventricular thrombus, ischemic cardiomyopathy (EF 25 to 30% from 10/2019), peripheral vascular disease, left second toe amputation Here to establish care. Last seen by cardiology in 12/2019 with recommendations for an ICD placement.  His CHF regimen was also changed to discontinue isosorbide, lisinopril and he was commenced on Entresto and spironolactone. For his left ventricular mural thrombus he is on chronic anticoagulation with Coumadin and his last INR was 1.8 on 03/2020  His blood sugars have been 130-170 fasting he endorses compliance with his medications.  Denies hypoglycemic episodes, numbness in extremities or visual concerns. Compliant with his statin and doing well on his antihypertensive. Past Medical History:  Diagnosis Date  . CAD (coronary artery disease)   . Essential hypertension   . Heart murmur    when I was a teenager- no mention of it now  . Hyperlipidemia   . LV (left ventricular) mural thrombus without MI ([place]) 04/27/2019  . Type 2 diabetes mellitus ([place])    Type II    Past Surgical History:  Procedure Laterality Date  . AMPUTATION Left 04/08/2019   Procedure: LEFT FOOT 4TH TOE AMPUTATION;  Surgeon: Newt Minion, MD;  Location: [place];  Service: Orthopedics;  Laterality: Left;  . CORONARY ANGIOPLASTY WITH STENT PLACEMENT  2013  . CORONARY ARTERY BYPASS GRAFT N/A 02/16/2015   Procedure: CORONARY ARTERY BYPASS GRAFTING (CABG) x5 using left internal mammary artery, right thigh greater saphenous vein, and left radial artery. ;  Surgeon: Melrose Nakayama, MD;  Location: [place];  Service: Open Heart Surgery;  Laterality: N/A;  . LAPAROSCOPIC APPENDECTOMY N/A 04/25/2019   Procedure:  APPENDECTOMY LAPAROSCOPIC;  Surgeon: Kieth Brightly Arta Bruce, MD;  Location: [place];  Service: General;  Laterality: N/A;  . LEFT HEART CATHETERIZATION WITH CORONARY ANGIOGRAM N/A 02/14/2015   Procedure: LEFT HEART CATHETERIZATION WITH CORONARY ANGIOGRAM;  Surgeon: Lorretta Harp, MD;  Location: [facility] CATH LAB;  Service: Cardiovascular;  Laterality: N/A;  . LOWER EXTREMITY ANGIOGRAPHY N/A 03/30/2019   Procedure: LOWER EXTREMITY ANGIOGRAPHY;  Surgeon: Lorretta Harp, MD;  Location: [place] CV LAB;  Service: Cardiovascular;  Laterality: N/A;  . RADIAL ARTERY HARVEST Left 02/16/2015   Procedure: RADIAL ARTERY HARVEST;  Surgeon: Melrose Nakayama, MD;  Location: [place];  Service: Open Heart Surgery;  Laterality: Left;  . TEE WITHOUT CARDIOVERSION N/A 02/16/2015   Procedure: TRANSESOPHAGEAL ECHOCARDIOGRAM (TEE);  Surgeon: Melrose Nakayama, MD;  Location: [place];  Service: Open Heart Surgery;  Laterality: N/A;    Family History  Problem Relation Age of Onset  . Coronary artery disease Father        had CABG  . Heart disease Father     No Known Allergies  Outpatient Medications Prior to Visit  Medication Sig Dispense Refill  . acetaminophen (TYLENOL) 325 MG tablet Take 2 tablets (650 mg total) by mouth every 6 (six) hours as needed for mild pain (or temp > 100).    Marland Kitchen atorvastatin (LIPITOR) 80 MG tablet Take 1 tablet (80 mg total) by mouth daily. 30 tablet 7  . carvedilol (COREG) 3.125 MG tablet Take 1 tablet (3.125 mg total) by mouth 2 (two) times daily with a meal. 180 tablet 3  .  clopidogrel (PLAVIX) 75 MG tablet Take 1 tablet (75 mg total) by mouth daily with breakfast. 90 tablet 2  . sacubitril-valsartan (ENTRESTO) 24-26 MG Take 1 tablet by mouth 2 (two) times daily. 60 tablet 3  . spironolactone (ALDACTONE) 25 MG tablet Take 0.5 tablets (12.5 mg total) by mouth daily. 15 tablet 5  . warfarin (COUMADIN) 5 MG tablet Tome 1 -1.5 pastillas cada dia. 135 tablet 0  . glipiZIDE (GLUCOTROL XL)  2.5 MG 24 hr tablet Take 1 tablet (2.5 mg total) by mouth daily with breakfast. 90 tablet 2  . metFORMIN (GLUCOPHAGE) 1000 MG tablet Take 1 tablet (1,000 mg total) by mouth 2 (two) times daily with a meal. 60 tablet 3   No facility-administered medications prior to visit.     ROS Review of Systems  Constitutional: Negative for activity change and appetite change.  HENT: Negative for sinus pressure and sore throat.   Eyes: Negative for visual disturbance.  Respiratory: Negative for cough, chest tightness and shortness of breath.   Cardiovascular: Negative for chest pain and leg swelling.  Gastrointestinal: Negative for abdominal distention, abdominal pain, constipation and diarrhea.  Endocrine: Negative.   Genitourinary: Negative for dysuria.  Musculoskeletal: Negative for joint swelling and myalgias.  Skin: Negative for rash.  Allergic/Immunologic: Negative.   Neurological: Negative for weakness, light-headedness and numbness.  Psychiatric/Behavioral: Negative for dysphoric mood and suicidal ideas.    Objective:  BP 124/68   Pulse (!) 52   Ht '5\' 7"'$  (1.702 m)   Wt 174 lb 12.8 oz (79.3 kg)   SpO2 98%   BMI 27.38 kg/m   BP/Weight 05/18/2020 9/83/3825 0/02/3975  Systolic BP 734 193 790  Diastolic BP 68 60 68  Wt. (Lbs) 174.8 179.4 181  BMI 27.38 28.1 28.35      Physical Exam Constitutional:      Appearance: He is well-developed.  Neck:     Vascular: No JVD.  Cardiovascular:     Rate and Rhythm: Bradycardia present.     Heart sounds: Murmur heard.      Comments: Healed surgical sternotomy scar Pulmonary:     Effort: Pulmonary effort is normal.     Breath sounds: Normal breath sounds. No wheezing or rales.  Chest:     Chest wall: No tenderness.  Abdominal:     General: Bowel sounds are normal. There is no distension.     Palpations: Abdomen is soft. There is no mass.     Tenderness: There is no abdominal tenderness.  Musculoskeletal:        General: Normal range  of motion.     Right lower leg: No edema.     Left lower leg: No edema.  Neurological:     Mental Status: He is alert and oriented to person, place, and time.  Psychiatric:        Mood and Affect: Mood normal.     CMP Latest Ref Rng & Units 01/18/2020 01/07/2020 07/17/2019  Glucose 70 - 99 mg/dL 273(H) 225(H) -  BUN 6 - 20 mg/dL 12 10 -  Creatinine 0.61 - 1.24 mg/dL 1.00 0.87 -  Sodium 135 - 145 mmol/L 139 136 -  Potassium 3.5 - 5.1 mmol/L 4.2 4.2 -  Chloride 98 - 111 mmol/L 104 106 -  CO2 22 - 32 mmol/L 23 22 -  Calcium 8.9 - 10.3 mg/dL 9.2 9.1 -  Total Protein 6.0 - 8.5 g/dL - - 7.2  Total Bilirubin 0.0 - 1.2 mg/dL - - 0.2  Alkaline Phos 39 - 117 IU/L - - 66  AST 0 - 40 IU/L - - 18  ALT 0 - 44 IU/L - - 19    Lipid Panel     Component Value Date/Time   CHOL 113 07/17/2019 0810   TRIG 109 07/17/2019 0810   HDL 28 (L) 07/17/2019 0810   CHOLHDL 4.0 07/17/2019 0810   CHOLHDL 6.1 02/12/2015 0347   VLDL 34 02/12/2015 0347   LDLCALC 65 07/17/2019 0810    CBC    Component Value Date/Time   WBC 11.9 (H) 04/27/2019 0356   RBC 4.15 (L) 04/27/2019 0356   HGB 12.9 (L) 04/27/2019 0356   HGB 14.3 03/24/2019 1423   HCT 38.9 (L) 04/27/2019 0356   HCT 42.1 03/24/2019 1423   PLT 134 (L) 04/27/2019 0356   PLT 183 03/24/2019 1423   MCV 93.7 04/27/2019 0356   MCV 91 03/24/2019 1423   MCH 31.1 04/27/2019 0356   MCHC 33.2 04/27/2019 0356   RDW 12.6 04/27/2019 0356   RDW 13.0 03/24/2019 1423   LYMPHSABS 3.8 01/21/2019 1602   MONOABS 0.6 01/21/2019 1602   EOSABS 0.4 01/21/2019 1602   BASOSABS 0.0 01/21/2019 1602    Lab Results  Component Value Date   HGBA1C 7.6 (A) 05/18/2020    Assessment & Plan:  1. Type 2 diabetes mellitus with other circulatory complication, without long-term current use of insulin (HCC) Suboptimal control with A1c of 7.6; goal is less than 7.0 Jardiance added to his regimen Counseled on Diabetic diet, my plate method, 696 minutes of moderate intensity  exercise/week Blood sugar logs with fasting goals of 80-120 mg/dl, random of less than 180 and in the event of sugars less than 60 mg/dl or greater than 400 mg/dl encouraged to notify the clinic. Advised on the need for annual eye exams, annual foot exams, Pneumonia vaccine. - POCT glucose (manual entry) - POCT glycosylated hemoglobin (Hb A1C) - empagliflozin (JARDIANCE) 10 MG TABS tablet; Take 1 tablet (10 mg total) by mouth daily before breakfast.  Dispense: 90 tablet; Refill: 1 - Microalbumin / creatinine urine ratio - LP+Non-HDL Cholesterol - CMP14+EGFR - metFORMIN (GLUCOPHAGE) 1000 MG tablet; Take 1 tablet (1,000 mg total) by mouth 2 (two) times daily with a meal.  Dispense: 180 tablet; Refill: 1 - glipiZIDE (GLUCOTROL XL) 2.5 MG 24 hr tablet; Take 1 tablet (2.5 mg total) by mouth daily with breakfast.  Dispense: 90 tablet; Refill: 1  2. LV (left ventricular) mural thrombus without MI ([place]) Currently on chronic anticoagulation with Coumadin Followed by the Coumadin clinic  3. S/P CABG x 5 Secondary to ischemic cardiomyopathy -consideration for ICD Stable Risk factor modification Continue high intensity statin  4. Essential hypertension Controlled Continue antihypertensive regimen Counseled on blood pressure goal of less than 130/80, low-sodium, DASH diet, medication compliance, 150 minutes of moderate intensity exercise per week. Discussed medication compliance, adverse effects.  5. PAOD (peripheral arterial occlusive disease) (HCC) Asymptomatic Continue risk factor modification, high intensity statin  6.  Ischemic cardiomyopathy EF of 25 to 30% from echo 10/2019 Consideration for ICD per cardiology note Continue Entresto, spironolactone Euvolemic at this time Meds ordered this encounter  Medications  . empagliflozin (JARDIANCE) 10 MG TABS tablet    Sig: Take 1 tablet (10 mg total) by mouth daily before breakfast.    Dispense:  90 tablet    Refill:  1  . metFORMIN  (GLUCOPHAGE) 1000 MG tablet    Sig: Take 1 tablet (1,000 mg total) by mouth  2 (two) times daily with a meal.    Dispense:  180 tablet    Refill:  1  . glipiZIDE (GLUCOTROL XL) 2.5 MG 24 hr tablet    Sig: Take 1 tablet (2.5 mg total) by mouth daily with breakfast.    Dispense:  90 tablet    Refill:  1    Follow-up: Return in about 3 months (around 08/18/2020) for Chronic disease management.       Charlott Rakes, MD, FAAFP. [facility] [place], [place]   05/18/2020, 2:31 PM

## 2020-05-18 NOTE — Patient Instructions (Signed)
Diabetes mellitus y nutricin, en adultos Diabetes Mellitus and Nutrition, Adult Si sufre de diabetes (diabetes mellitus), es muy importante tener hbitos alimenticios saludables debido a que sus niveles de azcar en la sangre (glucosa) se ven afectados en gran medida por lo que come y bebe. Comer alimentos saludables en las cantidades adecuadas, aproximadamente a la misma hora todos los das, lo ayudar a:  Controlar la glucemia.  Disminuir el riesgo de sufrir una enfermedad cardaca.  Mejorar la presin arterial.  Alcanzar o mantener un peso saludable. Todas las personas que sufren de diabetes son diferentes y cada una tiene necesidades diferentes en cuanto a un plan de alimentacin. El mdico puede recomendarle que trabaje con un especialista en dietas y nutricin (nutricionista) para elaborar el mejor plan para usted. Su plan de alimentacin puede variar segn factores como:  Las caloras que necesita.  Los medicamentos que toma.  Su peso.  Sus niveles de glucemia, presin arterial y colesterol.  Su nivel de actividad.  Otras afecciones que tenga, como enfermedades cardacas o renales. Cmo me afectan los carbohidratos? Los carbohidratos, o hidratos de carbono, afectan su nivel de glucemia ms que cualquier otro tipo de alimento. La ingesta de carbohidratos naturalmente aumenta la cantidad de glucosa en la sangre. El recuento de carbohidratos es un mtodo destinado a llevar un registro de la cantidad de carbohidratos que se consumen. El recuento de carbohidratos es importante para mantener la glucemia a un nivel saludable, especialmente si utiliza insulina o toma determinados medicamentos por va oral para la diabetes. Es importante conocer la cantidad de carbohidratos que se pueden ingerir en cada comida sin correr ningn riesgo. Esto es diferente en cada persona. Su nutricionista puede ayudarlo a calcular la cantidad de carbohidratos que debe ingerir en cada comida y en cada  refrigerio. Entre los alimentos que contienen carbohidratos, se incluyen:  Pan, cereal, arroz, pastas y galletas.  Papas y maz.  Guisantes, frijoles y lentejas.  Leche y yogur.  Frutas y jugo.  Postres, como pasteles, galletas, helado y caramelos. Cmo me afecta el alcohol? El alcohol puede provocar disminuciones sbitas de la glucemia (hipoglucemia), especialmente si utiliza insulina o toma determinados medicamentos por va oral para la diabetes. La hipoglucemia es una afeccin potencialmente mortal. Los sntomas de la hipoglucemia (somnolencia, mareos y confusin) son similares a los sntomas de haber consumido demasiado alcohol. Si el mdico afirma que el alcohol es seguro para usted, siga estas pautas:  Limite el consumo de alcohol a no ms de 1medida por da si es mujer y no est embarazada, y a 2medidas si es hombre. Una medida equivale a 12oz (355ml) de cerveza, 5oz (148ml) de vino o 1oz (44ml) de bebidas alcohlicas de alta graduacin.  No beba con el estmago vaco.  Mantngase hidratado bebiendo agua, refrescos dietticos o t helado sin azcar.  Tenga en cuenta que los refrescos comunes, los jugos y otras bebida para mezclar pueden contener mucha azcar y se deben contar como carbohidratos. Cules son algunos consejos para seguir este plan?  Leer las etiquetas de los alimentos  Comience por leer el tamao de la porcin en la "Informacin nutricional" en las etiquetas de los alimentos envasados y las bebidas. La cantidad de caloras, carbohidratos, grasas y otros nutrientes mencionados en la etiqueta se basan en una porcin del alimento. Muchos alimentos contienen ms de una porcin por envase.  Verifique la cantidad total de gramos (g) de carbohidratos totales en una porcin. Puede calcular la cantidad de porciones de carbohidratos al dividir el   total de carbohidratos por 15. Por ejemplo, si un alimento tiene un total de 30g de carbohidratos, equivale a 2  porciones de carbohidratos.  Verifique la cantidad de gramos (g) de grasas saturadas y grasas trans en una porcin. Escoja alimentos que no contengan grasa o que tengan un bajo contenido.  Verifique la cantidad de miligramos (mg) de sal (sodio) en una porcin. La mayora de las personas deben limitar la ingesta de sodio total a menos de 2300mg por da.  Siempre consulte la informacin nutricional de los alimentos etiquetados como "con bajo contenido de grasa" o "sin grasa". Estos alimentos pueden tener un mayor contenido de azcar agregada o carbohidratos refinados, y deben evitarse.  Hable con su nutricionista para identificar sus objetivos diarios en cuanto a los nutrientes mencionados en la etiqueta. Al ir de compras  Evite comprar alimentos procesados, enlatados o precocinados. Estos alimentos tienden a tener una mayor cantidad de grasa, sodio y azcar agregada.  Compre en la zona exterior de la tienda de comestibles. Esta zona incluye frutas y verduras frescas, granos a granel, carnes frescas y productos lcteos frescos. Al cocinar  Utilice mtodos de coccin a baja temperatura, como hornear, en lugar de mtodos de coccin a alta temperatura, como frer en abundante aceite.  Cocine con aceites saludables, como el aceite de oliva, canola o girasol.  Evite cocinar con manteca, crema o carnes con alto contenido de grasa. Planificacin de las comidas  Coma las comidas y los refrigerios regularmente, preferentemente a la misma hora todos los das. Evite pasar largos perodos de tiempo sin comer.  Consuma alimentos ricos en fibra, como frutas frescas, verduras, frijoles y cereales integrales. Consulte a su nutricionista sobre cuntas porciones de carbohidratos puede consumir en cada comida.  Consuma entre 4 y 6 onzas (oz) de protenas magras por da, como carnes magras, pollo, pescado, huevos o tofu. Una onza de protena magra equivale a: ? 1 onza de carne, pollo o  pescado. ? 1huevo. ?  taza de tofu.  Coma algunos alimentos por da que contengan grasas saludables, como aguacates, frutos secos, semillas y pescado. Estilo de vida  Controle su nivel de glucemia con regularidad.  Haga actividad fsica habitualmente como se lo haya indicado el mdico. Esto puede incluir lo siguiente: ? 150minutos semanales de ejercicio de intensidad moderada o alta. Esto podra incluir caminatas dinmicas, ciclismo o gimnasia acutica. ? Realizar ejercicios de elongacin y de fortalecimiento, como yoga o levantamiento de pesas, por lo menos 2veces por semana.  Tome los medicamentos como se lo haya indicado el mdico.  No consuma ningn producto que contenga nicotina o tabaco, como cigarrillos y cigarrillos electrnicos. Si necesita ayuda para dejar de fumar, consulte al mdico.  Trabaje con un asesor o instructor en diabetes para identificar estrategias para controlar el estrs y cualquier desafo emocional y social. Preguntas para hacerle al mdico  Es necesario que consulte a un instructor en el cuidado de la diabetes?  Es necesario que me rena con un nutricionista?  A qu nmero puedo llamar si tengo preguntas?  Cules son los mejores momentos para controlar la glucemia? Dnde encontrar ms informacin:  Asociacin Estadounidense de la Diabetes (American Diabetes Association): diabetes.org  Academia de Nutricin y Diettica (Academy of Nutrition and Dietetics): www.eatright.org  Instituto Nacional de la Diabetes y las Enfermedades Digestivas y Renales (National Institute of Diabetes and Digestive and Kidney Diseases, NIH): www.niddk.nih.gov Resumen  Un plan de alimentacin saludable lo ayudar a controlar la glucemia y mantener un estilo de vida saludable.    Trabajar con un especialista en dietas y nutricin (nutricionista) puede ayudarlo a elaborar el mejor plan de alimentacin para usted.  Tenga en cuenta que los carbohidratos (hidratos de  carbono) y el alcohol tienen efectos inmediatos en sus niveles de glucemia. Es importante contar los carbohidratos que ingiere y consumir alcohol con prudencia. Esta informacin no tiene como fin reemplazar el consejo del mdico. Asegrese de hacerle al mdico cualquier pregunta que tenga. Document Revised: 06/25/2017 Document Reviewed: 02/04/2017 Elsevier Patient Education  2020 Elsevier Inc.  

## 2020-05-19 LAB — CMP14+EGFR
ALT: 20 IU/L (ref 0–44)
AST: 16 IU/L (ref 0–40)
Albumin/Globulin Ratio: 1.8 (ref 1.2–2.2)
Albumin: 4.3 g/dL (ref 4.0–5.0)
Alkaline Phosphatase: 70 IU/L (ref 48–121)
BUN/Creatinine Ratio: 13 (ref 9–20)
BUN: 12 mg/dL (ref 6–24)
Bilirubin Total: 0.3 mg/dL (ref 0.0–1.2)
CO2: 19 mmol/L — ABNORMAL LOW (ref 20–29)
Calcium: 9.2 mg/dL (ref 8.7–10.2)
Chloride: 108 mmol/L — ABNORMAL HIGH (ref 96–106)
Creatinine, Ser: 0.93 mg/dL (ref 0.76–1.27)
GFR calc Af Amer: 110 mL/min/{1.73_m2} (ref >59)
GFR calc non Af Amer: 95 mL/min/{1.73_m2} (ref >59)
Globulin, Total: 2.4 g/dL (ref 1.5–4.5)
Glucose: 149 mg/dL — ABNORMAL HIGH (ref 65–99)
Potassium: 4.4 mmol/L (ref 3.5–5.2)
Sodium: 141 mmol/L (ref 134–144)
Total Protein: 6.7 g/dL (ref 6.0–8.5)

## 2020-05-19 LAB — LP+NON-HDL CHOLESTEROL
Cholesterol, Total: 109 mg/dL (ref 100–199)
HDL: 28 mg/dL — ABNORMAL LOW (ref >39)
LDL Chol Calc (NIH): 60 mg/dL (ref 0–99)
Total Non-HDL-Chol (LDL+VLDL): 81 mg/dL (ref 0–129)
Triglycerides: 110 mg/dL (ref 0–149)
VLDL Cholesterol Cal: 21 mg/dL (ref 5–40)

## 2020-05-19 LAB — MICROALBUMIN / CREATININE URINE RATIO
Creatinine, Urine: 174.3 mg/dL
Microalb/Creat Ratio: 6 mg/g creat (ref 0–29)
Microalbumin, Urine: 9.8 ug/mL

## 2020-05-19 NOTE — Telephone Encounter (Signed)
Called and lmomed the pt that we need a coumadin check in order to refill. Used interpreter services to call

## 2020-05-19 NOTE — Telephone Encounter (Signed)
Called with the interpreter and lmomed the pt that we will need him to schedule a coumadin appt

## 2020-05-26 ENCOUNTER — Telehealth: Payer: Self-pay

## 2020-05-26 NOTE — Telephone Encounter (Signed)
-----   Message from Hoy Register, MD sent at 05/19/2020  8:50 PM EDT ----- Kidney and liver functions are normal. Cholesterol is normal but good cholesterol IS not high enough, this can be increased by increasing physical activity

## 2020-05-26 NOTE — Telephone Encounter (Signed)
Patient was called and a voicemail was left informing patient to return phone call for lab results. 

## 2020-06-03 ENCOUNTER — Telehealth: Payer: Self-pay | Admitting: Pharmacist

## 2020-06-03 NOTE — Telephone Encounter (Signed)
LMOM; patient to call back and reschedule appointment for overdue INR (message left in Spanish). Noted "no show" on 05/16/2020

## 2020-06-03 NOTE — Telephone Encounter (Signed)
Left message for patient to call back and schedule f/u appointment with coumadin clinic

## 2020-06-07 ENCOUNTER — Other Ambulatory Visit: Payer: Self-pay

## 2020-06-07 ENCOUNTER — Ambulatory Visit (INDEPENDENT_AMBULATORY_CARE_PROVIDER_SITE_OTHER): Payer: Self-pay | Admitting: Pharmacist Clinician (PhC)/ Clinical Pharmacy Specialist

## 2020-06-07 DIAGNOSIS — I240 Acute coronary thrombosis not resulting in myocardial infarction: Secondary | ICD-10-CM

## 2020-06-07 DIAGNOSIS — Z7901 Long term (current) use of anticoagulants: Secondary | ICD-10-CM

## 2020-06-07 LAB — POCT INR: INR: 1.2 — AB (ref 2.0–3.0)

## 2020-06-07 MED ORDER — WARFARIN SODIUM 5 MG PO TABS: ORAL_TABLET | ORAL | 1 refills | Status: DC

## 2020-06-22 ENCOUNTER — Other Ambulatory Visit: Payer: Self-pay

## 2020-06-22 ENCOUNTER — Other Ambulatory Visit: Payer: Self-pay | Admitting: Cardiovascular Disease

## 2020-06-22 ENCOUNTER — Ambulatory Visit (INDEPENDENT_AMBULATORY_CARE_PROVIDER_SITE_OTHER): Payer: Self-pay | Admitting: Pharmacist

## 2020-06-22 DIAGNOSIS — I240 Acute coronary thrombosis not resulting in myocardial infarction: Secondary | ICD-10-CM

## 2020-06-22 DIAGNOSIS — I252 Old myocardial infarction: Secondary | ICD-10-CM

## 2020-06-22 DIAGNOSIS — Z7901 Long term (current) use of anticoagulants: Secondary | ICD-10-CM

## 2020-06-22 DIAGNOSIS — R001 Bradycardia, unspecified: Secondary | ICD-10-CM

## 2020-06-22 LAB — POCT INR: INR: 1.7 — AB (ref 2.0–3.0)

## 2020-06-22 MED ORDER — CLOPIDOGREL BISULFATE 75 MG PO TABS: 75.0000 mg | ORAL_TABLET | Freq: Every day | ORAL | 2 refills | Status: DC

## 2020-06-22 MED ORDER — CARVEDILOL 3.125 MG PO TABS: 3.1250 mg | ORAL_TABLET | Freq: Two times a day (BID) | ORAL | 3 refills | Status: DC

## 2020-06-22 MED ORDER — SACUBITRIL-VALSARTAN 24-26 MG PO TABS: 1.0000 | ORAL_TABLET | Freq: Two times a day (BID) | ORAL | 3 refills | Status: DC

## 2020-06-22 MED ORDER — SPIRONOLACTONE 25 MG PO TABS: 12.5000 mg | ORAL_TABLET | Freq: Every day | ORAL | 5 refills | Status: DC

## 2020-06-22 MED ORDER — WARFARIN SODIUM 5 MG PO TABS: ORAL_TABLET | ORAL | 1 refills | Status: DC

## 2020-08-18 ENCOUNTER — Ambulatory Visit: Payer: Self-pay | Admitting: Family Medicine

## 2020-09-02 IMAGING — CR LEFT FOOT - COMPLETE 3+ VIEW
3 series · 3 of 3 positions shown · non-contrast
Comparison: Radiographs January 21, 2019.

CLINICAL DATA: Nonhealing open wound of toe.

EXAM:
LEFT FOOT - COMPLETE 3+ VIEW

[x foot ap left]
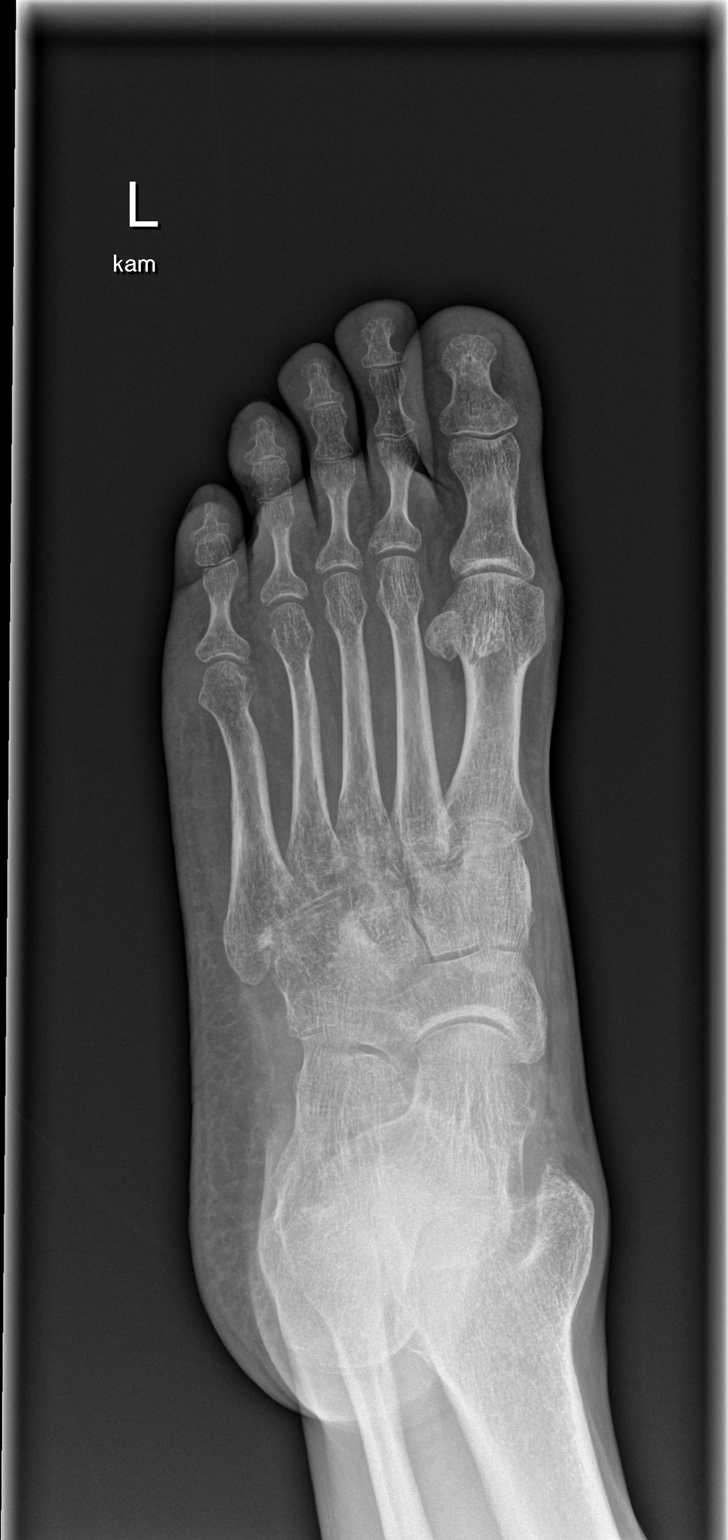

[x foot obl left]
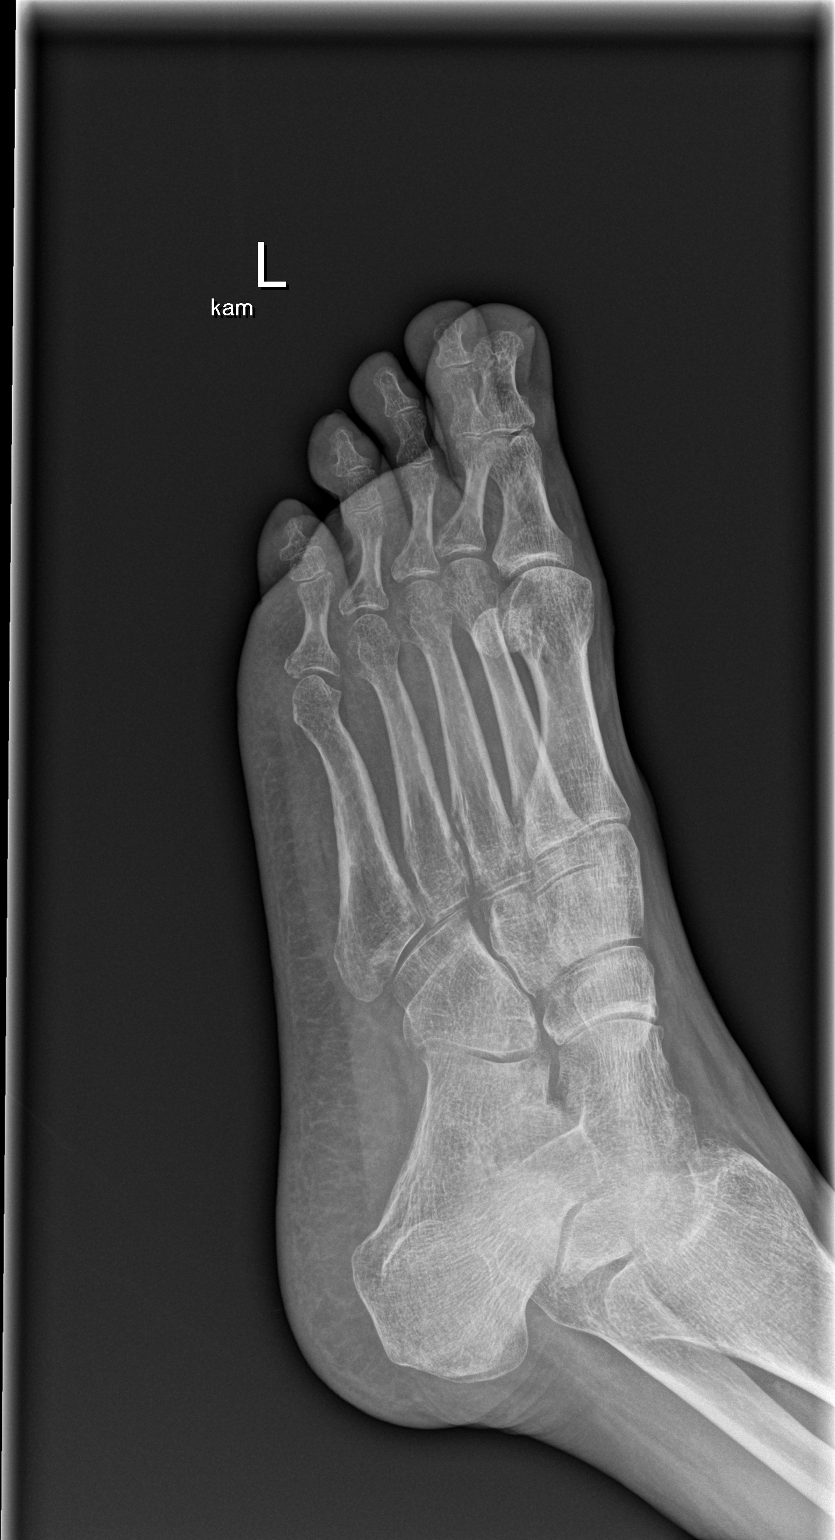

[x foot lat left]
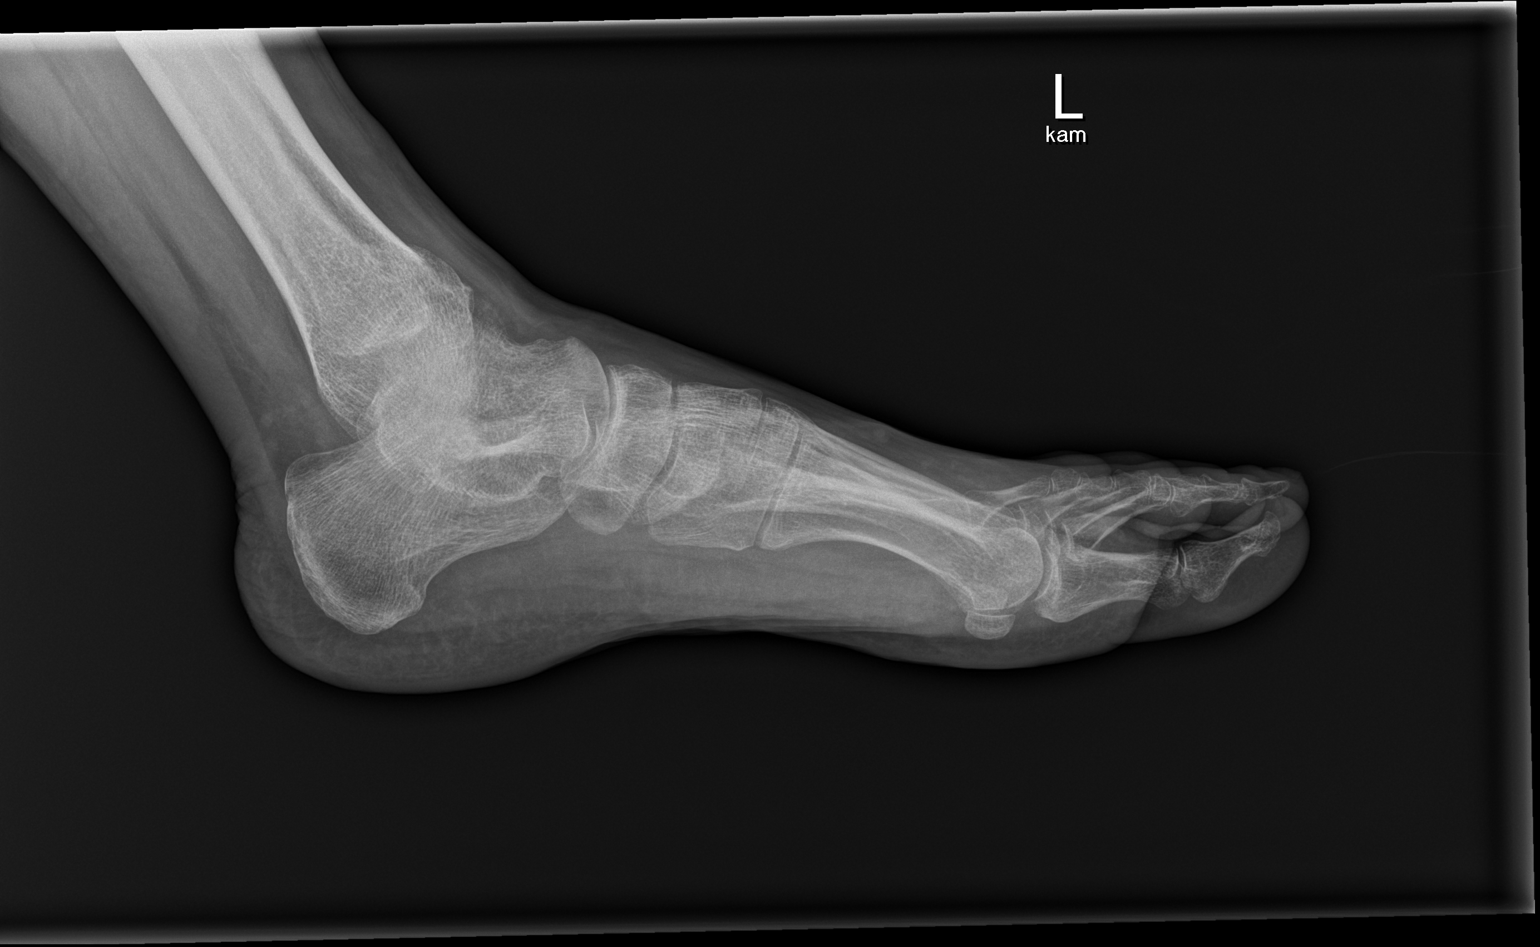

[3 of 3 positions shown; findings below may reference images not displayed]

FINDINGS: There is no evidence of fracture or dislocation. There is no
evidence of arthropathy. Large wound is seen involving the lateral
aspect of the fourth toe, with possible minimal lytic destruction
involving the adjacent distal portion of the fourth proximal phalanx
and proximal portion of the fourth middle phalanx.
IMPRESSION: Large soft tissue wound is seen involving the lateral portion of the
left fourth toe with possible lytic destruction involving the
underlying fourth proximal and middle phalanges suggesting
osteomyelitis. MRI may be performed further evaluation.

## 2020-09-14 ENCOUNTER — Other Ambulatory Visit: Payer: Self-pay | Admitting: Cardiovascular Disease

## 2020-09-14 ENCOUNTER — Telehealth: Payer: Self-pay

## 2020-09-14 ENCOUNTER — Other Ambulatory Visit: Payer: Self-pay | Admitting: Pharmacist

## 2020-09-14 MED ORDER — WARFARIN SODIUM 5 MG PO TABS: ORAL_TABLET | ORAL | 0 refills | Status: DC

## 2020-09-14 NOTE — Telephone Encounter (Signed)
lmom for overdue inr 

## 2020-09-21 ENCOUNTER — Other Ambulatory Visit: Payer: Self-pay | Admitting: Cardiovascular Disease

## 2020-09-21 ENCOUNTER — Ambulatory Visit (INDEPENDENT_AMBULATORY_CARE_PROVIDER_SITE_OTHER): Payer: Self-pay

## 2020-09-21 ENCOUNTER — Other Ambulatory Visit: Payer: Self-pay

## 2020-09-21 DIAGNOSIS — Z7901 Long term (current) use of anticoagulants: Secondary | ICD-10-CM

## 2020-09-21 DIAGNOSIS — I240 Acute coronary thrombosis not resulting in myocardial infarction: Secondary | ICD-10-CM

## 2020-09-21 DIAGNOSIS — Z5181 Encounter for therapeutic drug level monitoring: Secondary | ICD-10-CM

## 2020-09-21 LAB — POCT INR: INR: 4.7 — AB (ref 2.0–3.0)

## 2020-09-21 MED ORDER — WARFARIN SODIUM 5 MG PO TABS: ORAL_TABLET | ORAL | 0 refills | Status: DC

## 2020-09-21 NOTE — Patient Instructions (Addendum)
Hold today and tomorrow and then Resume 1 and 1/2 tablet daily except for 1 Monday ,Wednesday and Friday   Recheck INR in 2 weeks. Call with bleeding problems or medication changes [phone].

## 2020-10-05 ENCOUNTER — Ambulatory Visit (INDEPENDENT_AMBULATORY_CARE_PROVIDER_SITE_OTHER): Payer: Self-pay

## 2020-10-05 ENCOUNTER — Other Ambulatory Visit: Payer: Self-pay

## 2020-10-05 DIAGNOSIS — Z5181 Encounter for therapeutic drug level monitoring: Secondary | ICD-10-CM

## 2020-10-05 DIAGNOSIS — I240 Acute coronary thrombosis not resulting in myocardial infarction: Secondary | ICD-10-CM

## 2020-10-05 DIAGNOSIS — Z7901 Long term (current) use of anticoagulants: Secondary | ICD-10-CM

## 2020-10-05 LAB — POCT INR: INR: 2.5 (ref 2.0–3.0)

## 2020-10-05 NOTE — Patient Instructions (Addendum)
Continue taking 1 and 1/2 tablet daily except for 1 Monday ,Wednesday and Friday   Recheck INR in 4 weeks. Call with bleeding problems or medication changes [phone].

## 2020-10-30 IMAGING — CT CT ABDOMEN AND PELVIS WITH CONTRAST
2 of 5 series · 15 of 46 positions shown, 17 images · IV contrast (omnipaque)
Comparison: 01/06/2017

CLINICAL DATA: 47-year-old male with abdominal pain and suspected
appendicitis

EXAM:
CT ABDOMEN AND PELVIS WITH CONTRAST
TECHNIQUE: Multidetector CT imaging of the abdomen and pelvis was performed
using the standard protocol following bolus administration of
intravenous contrast.
CONTRAST:  100mL OMNIPAQUE IOHEXOL 300 MG/ML  SOLN

[Series 3: abd/ pelvis 5.0 i30f 2 · axial · 0.87mm/px · z∈[+632,+1067]mm · 12 of 99 slices shown, 14 images]
[im 6/99  soft-tissue]
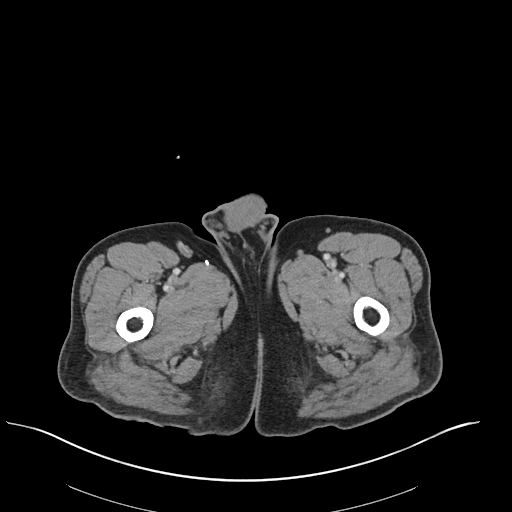
[im 6/99  bone]
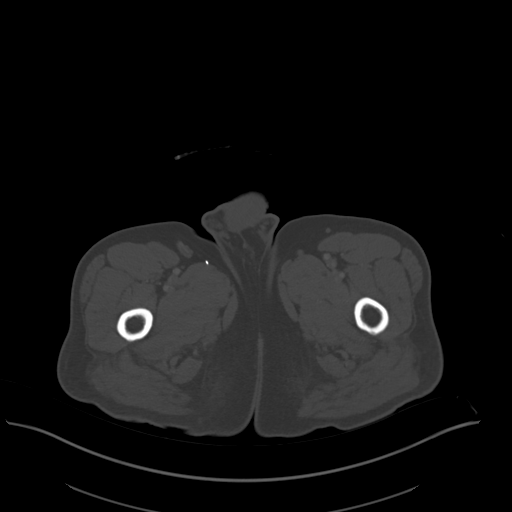
[im 16/99  soft-tissue]
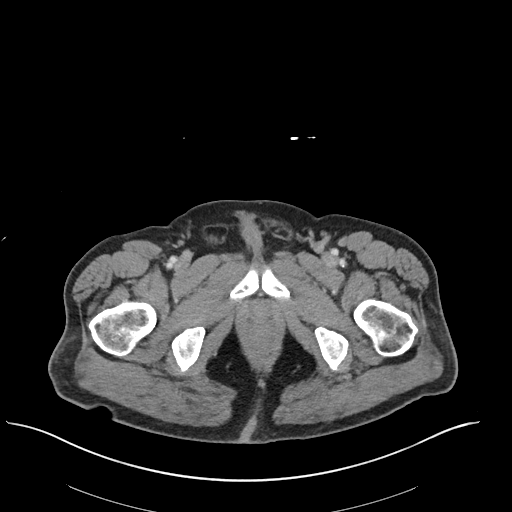
[im 21/99  soft-tissue]
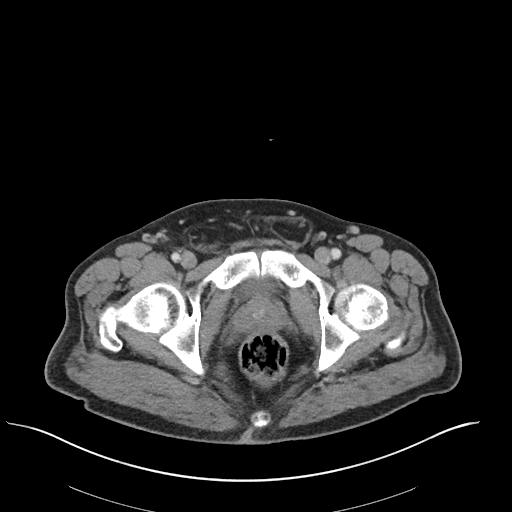
[im 31/99  soft-tissue]
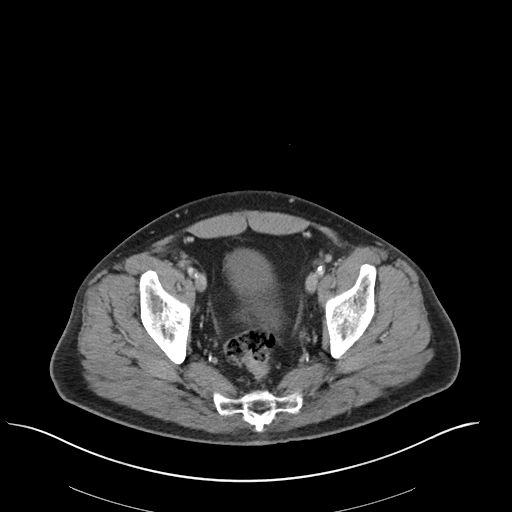
[im 37/99  soft-tissue]
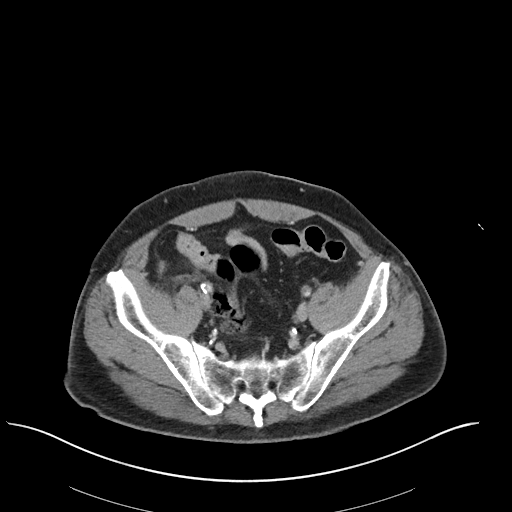
[im 47/99  soft-tissue]
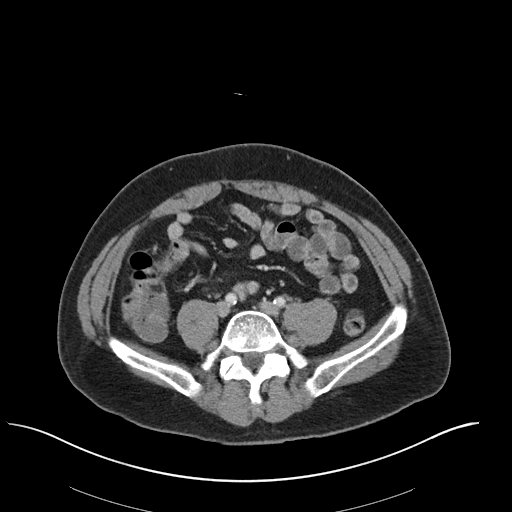
[im 52/99  soft-tissue]
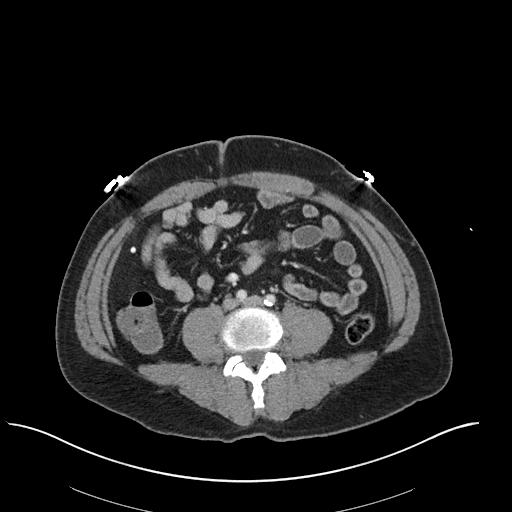
[im 62/99  soft-tissue]
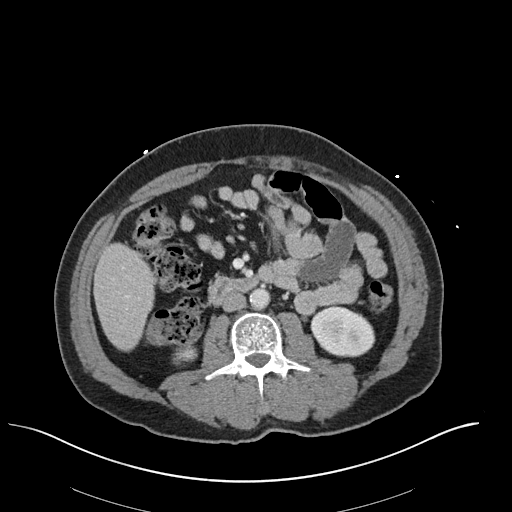
[im 68/99  soft-tissue]
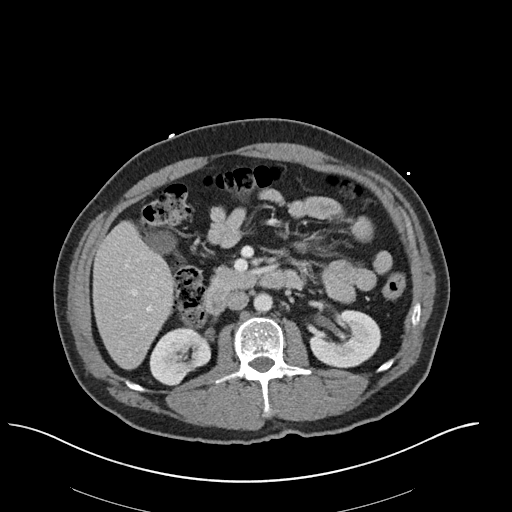
[im 68/99  bone]
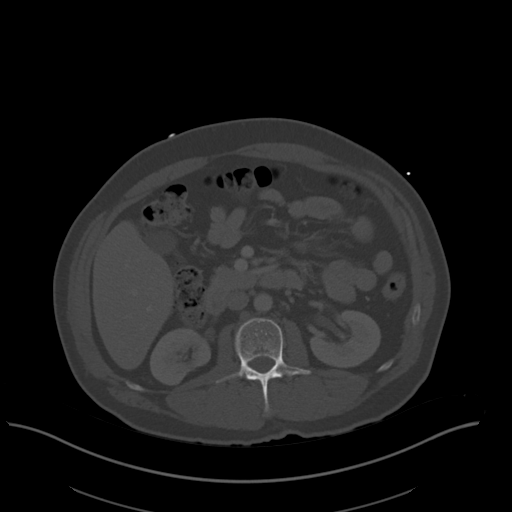
[im 78/99  soft-tissue]
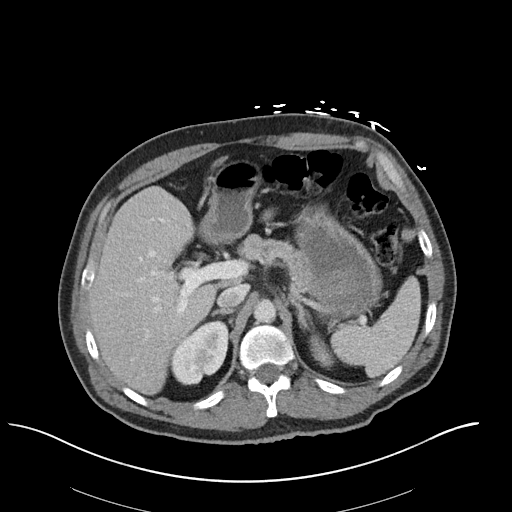
[im 83/99  soft-tissue]
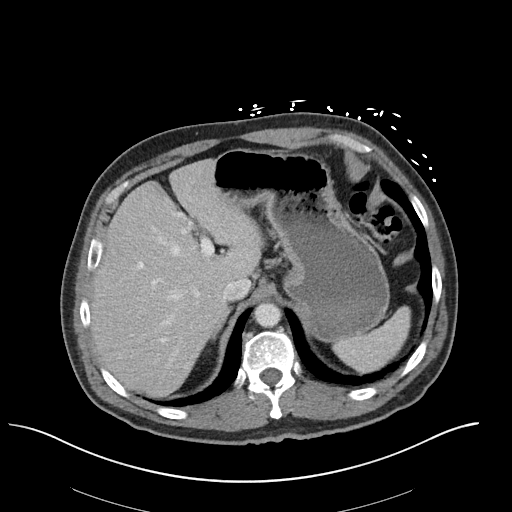
[im 93/99  soft-tissue]
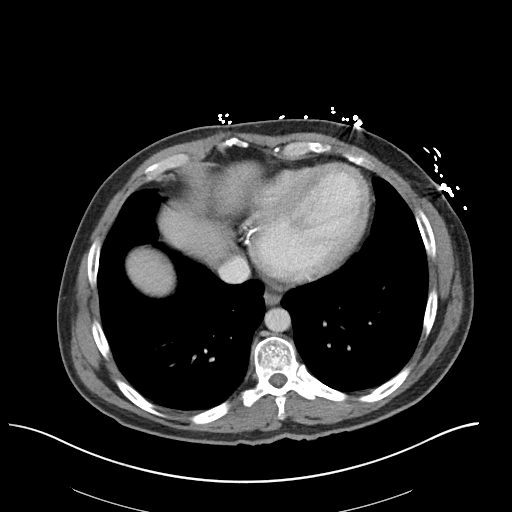

[Series 6: coronal soft tissue · coronal · 0.76mm/px · 3 of 107 slices shown]
[im 36/107  soft-tissue]
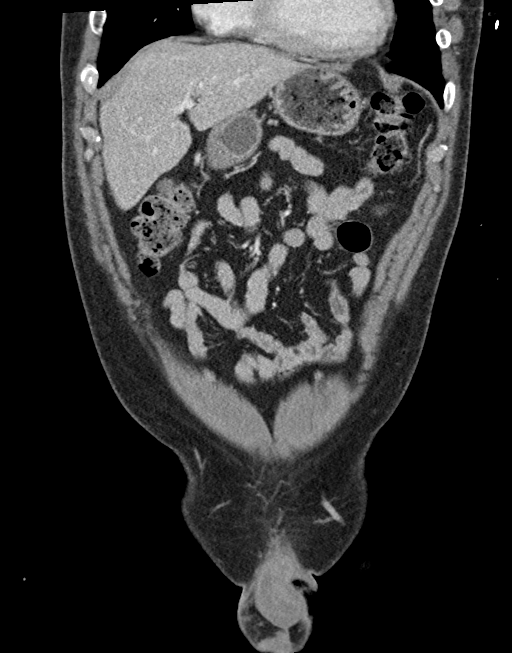
[im 48/107  soft-tissue]
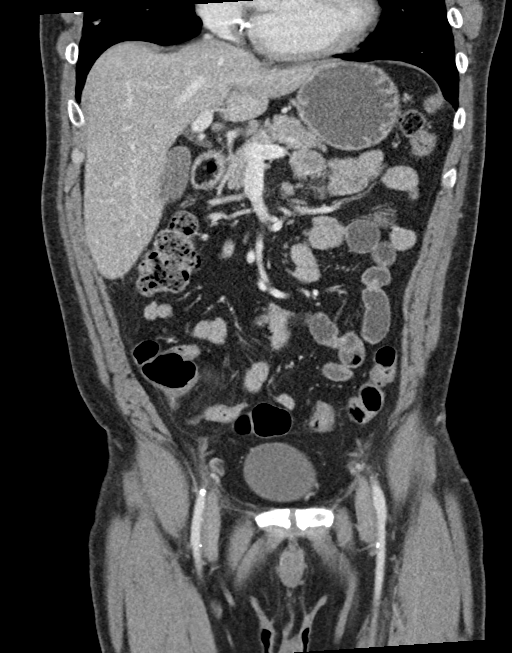
[im 59/107  soft-tissue]
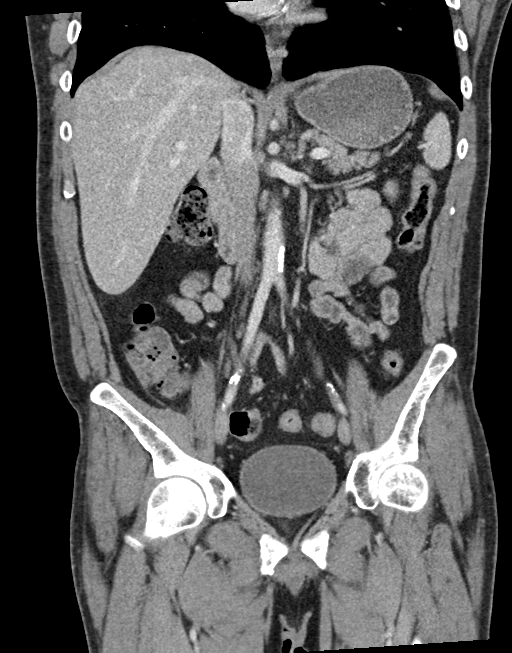

[15 of 46 positions shown; findings below may reference images not displayed]

FINDINGS: Lower chest: Filling defect at the apex of the left ventricle.
Evidence of subendothelial hypodensity/hypoenhancement, likely
representing a prior MI.

Hepatobiliary: Unremarkable liver.  Unremarkable gallbladder.

Pancreas: Unremarkable

Spleen: Unremarkable

Adrenals/Urinary Tract: Unremarkable appearance the bilateral
adrenal glands.

Right kidney without hydronephrosis or nephrolithiasis. No
perinephric stranding. Unremarkable course the right ureter.

Left kidney with no hydronephrosis. No perinephric stranding.
Density within the papillary region at the superior aspect of the
left kidney may represent nonobstructive nephrolithiasis versus
early excretion of contrast. Unremarkable course of the left ureter.

Urinary bladder unremarkable.

Stomach/Bowel: Unremarkable stomach. Compared to the prior CT there
has been resolution of the wall thickening in the antral region.

Unremarkable small bowel.

Inflammatory changes at the appendix which demonstrates
hyperenhancement. No calcified fecalith. No focal gas or abscess.

Colonic diverticula. No associated inflammatory changes. No large
stool burden.

Vascular/Lymphatic: Mild atherosclerosis of the abdominal aorta.
Calcifications of the bilateral iliac system. Bilateral iliac
arteries and the proximal femoral arteries are patent.

No adenopathy.

Reproductive: Unremarkable prostate.

Other: Bilateral fat containing inguinal hernia. No ventral wall
hernia.

Musculoskeletal: No acute displaced fracture. Mild degenerative
changes of the visualized thoracolumbar spine.
IMPRESSION: Acute appendicitis, uncomplicated.

**An incidental finding of potential clinical significance has been
found. Filling defect at the apex of the left ventricle, concerning
for left ventricular thrombus, with evidence of prior myocardial
infarction.** further evaluation with ECHO may be useful for
confirmation as well as cardiology referral.

These results were discussed by telephone at the time of
interpretation on 04/25/2019 at [DATE] with Dr. FAWN GERMANO.

Aortic Atherosclerosis ([id number]).

Additional ancillary findings as above.

## 2021-01-23 ENCOUNTER — Telehealth: Payer: Self-pay

## 2021-01-23 NOTE — Telephone Encounter (Signed)
Used interpreter services to lmom for overdue inr

## 2021-02-15 ENCOUNTER — Telehealth: Payer: Self-pay

## 2021-02-15 NOTE — Telephone Encounter (Signed)
lmom for overdue inr and I used interpreter services

## 2021-03-03 ENCOUNTER — Telehealth: Payer: Self-pay | Admitting: Pharmacist

## 2021-03-03 NOTE — Telephone Encounter (Signed)
LMOM (in Spanish) to contact coumadin clinic for follow up appointment.  Last INR dose 3 months ago

## 2021-03-13 ENCOUNTER — Emergency Department (HOSPITAL_COMMUNITY): Payer: Self-pay

## 2021-03-13 ENCOUNTER — Encounter (HOSPITAL_COMMUNITY): Payer: Self-pay | Admitting: *Deleted

## 2021-03-13 ENCOUNTER — Inpatient Hospital Stay (HOSPITAL_COMMUNITY)
Admission: EM | Admit: 2021-03-13 | Discharge: 2021-03-17 | DRG: 291 | Disposition: A | Discharge status: Home / Self Care | Payer: Self-pay | Attending: Internal Medicine | Admitting: Internal Medicine

## 2021-03-13 ENCOUNTER — Other Ambulatory Visit: Payer: Self-pay

## 2021-03-13 DIAGNOSIS — E785 Hyperlipidemia, unspecified: Secondary | ICD-10-CM | POA: Diagnosis present

## 2021-03-13 DIAGNOSIS — E1151 Type 2 diabetes mellitus with diabetic peripheral angiopathy without gangrene: Secondary | ICD-10-CM | POA: Diagnosis present

## 2021-03-13 DIAGNOSIS — R001 Bradycardia, unspecified: Secondary | ICD-10-CM

## 2021-03-13 DIAGNOSIS — E1159 Type 2 diabetes mellitus with other circulatory complications: Secondary | ICD-10-CM

## 2021-03-13 DIAGNOSIS — R109 Unspecified abdominal pain: Secondary | ICD-10-CM | POA: Diagnosis present

## 2021-03-13 DIAGNOSIS — N179 Acute kidney failure, unspecified: Secondary | ICD-10-CM | POA: Diagnosis present

## 2021-03-13 DIAGNOSIS — Z951 Presence of aortocoronary bypass graft: Secondary | ICD-10-CM

## 2021-03-13 DIAGNOSIS — I2510 Atherosclerotic heart disease of native coronary artery without angina pectoris: Secondary | ICD-10-CM | POA: Diagnosis present

## 2021-03-13 DIAGNOSIS — J9601 Acute respiratory failure with hypoxia: Secondary | ICD-10-CM | POA: Diagnosis present

## 2021-03-13 DIAGNOSIS — Z7901 Long term (current) use of anticoagulants: Secondary | ICD-10-CM

## 2021-03-13 DIAGNOSIS — Z9049 Acquired absence of other specified parts of digestive tract: Secondary | ICD-10-CM

## 2021-03-13 DIAGNOSIS — D72829 Elevated white blood cell count, unspecified: Secondary | ICD-10-CM | POA: Diagnosis present

## 2021-03-13 DIAGNOSIS — I255 Ischemic cardiomyopathy: Secondary | ICD-10-CM | POA: Diagnosis present

## 2021-03-13 DIAGNOSIS — R0902 Hypoxemia: Secondary | ICD-10-CM

## 2021-03-13 DIAGNOSIS — Z7902 Long term (current) use of antithrombotics/antiplatelets: Secondary | ICD-10-CM

## 2021-03-13 DIAGNOSIS — E119 Type 2 diabetes mellitus without complications: Secondary | ICD-10-CM

## 2021-03-13 DIAGNOSIS — Z20822 Contact with and (suspected) exposure to covid-19: Secondary | ICD-10-CM | POA: Diagnosis present

## 2021-03-13 DIAGNOSIS — I252 Old myocardial infarction: Secondary | ICD-10-CM

## 2021-03-13 DIAGNOSIS — I5023 Acute on chronic systolic (congestive) heart failure: Secondary | ICD-10-CM | POA: Diagnosis present

## 2021-03-13 DIAGNOSIS — B349 Viral infection, unspecified: Secondary | ICD-10-CM | POA: Diagnosis present

## 2021-03-13 DIAGNOSIS — I110 Hypertensive heart disease with heart failure: Principal | ICD-10-CM | POA: Diagnosis present

## 2021-03-13 DIAGNOSIS — Z79899 Other long term (current) drug therapy: Secondary | ICD-10-CM

## 2021-03-13 DIAGNOSIS — Z955 Presence of coronary angioplasty implant and graft: Secondary | ICD-10-CM

## 2021-03-13 DIAGNOSIS — Z9119 Patient's noncompliance with other medical treatment and regimen: Secondary | ICD-10-CM

## 2021-03-13 DIAGNOSIS — R112 Nausea with vomiting, unspecified: Secondary | ICD-10-CM

## 2021-03-13 DIAGNOSIS — I779 Disorder of arteries and arterioles, unspecified: Secondary | ICD-10-CM | POA: Diagnosis present

## 2021-03-13 DIAGNOSIS — Z87891 Personal history of nicotine dependence: Secondary | ICD-10-CM

## 2021-03-13 DIAGNOSIS — I513 Intracardiac thrombosis, not elsewhere classified: Secondary | ICD-10-CM | POA: Diagnosis present

## 2021-03-13 DIAGNOSIS — Z7984 Long term (current) use of oral hypoglycemic drugs: Secondary | ICD-10-CM

## 2021-03-13 DIAGNOSIS — Z9114 Patient's other noncompliance with medication regimen: Secondary | ICD-10-CM

## 2021-03-13 LAB — CBC WITH DIFFERENTIAL/PLATELET
Abs Immature Granulocytes: 0.05 10*3/uL (ref 0.00–0.07)
Basophils Absolute: 0 10*3/uL (ref 0.0–0.1)
Basophils Relative: 0 %
Eosinophils Absolute: 0.1 10*3/uL (ref 0.0–0.5)
Eosinophils Relative: 1 %
HCT: 44 % (ref 39.0–52.0)
Hemoglobin: 14.6 g/dL (ref 13.0–17.0)
Immature Granulocytes: 0 %
Lymphocytes Relative: 23 %
Lymphs Abs: 3 10*3/uL (ref 0.7–4.0)
MCH: 31.3 pg (ref 26.0–34.0)
MCHC: 33.2 g/dL (ref 30.0–36.0)
MCV: 94.4 fL (ref 80.0–100.0)
Monocytes Absolute: 0.7 10*3/uL (ref 0.1–1.0)
Monocytes Relative: 5 %
Neutro Abs: 9 10*3/uL — ABNORMAL HIGH (ref 1.7–7.7)
Neutrophils Relative %: 71 %
Platelets: 187 10*3/uL (ref 150–400)
RBC: 4.66 MIL/uL (ref 4.22–5.81)
RDW: 13.1 % (ref 11.5–15.5)
WBC: 12.8 10*3/uL — ABNORMAL HIGH (ref 4.0–10.5)
nRBC: 0 % (ref 0.0–0.2)

## 2021-03-13 LAB — COMPREHENSIVE METABOLIC PANEL
ALT: 19 U/L (ref 0–44)
AST: 15 U/L (ref 15–41)
Albumin: 3.7 g/dL (ref 3.5–5.0)
Alkaline Phosphatase: 58 U/L (ref 38–126)
Anion gap: 8 (ref 5–15)
BUN: 14 mg/dL (ref 6–20)
CO2: 22 mmol/L (ref 22–32)
Calcium: 9.1 mg/dL (ref 8.9–10.3)
Chloride: 107 mmol/L (ref 98–111)
Creatinine, Ser: 1.13 mg/dL (ref 0.61–1.24)
GFR, Estimated: >60 mL/min (ref >60)
Glucose, Bld: 212 mg/dL — ABNORMAL HIGH (ref 70–99)
Potassium: 4.2 mmol/L (ref 3.5–5.1)
Sodium: 137 mmol/L (ref 135–145)
Total Bilirubin: 0.6 mg/dL (ref 0.3–1.2)
Total Protein: 7 g/dL (ref 6.5–8.1)

## 2021-03-13 LAB — LIPASE, BLOOD: Lipase: 33 U/L (ref 11–51)

## 2021-03-13 MED ORDER — ONDANSETRON 4 MG PO TBDP: ORAL_TABLET | ORAL | 0 refills | Status: DC

## 2021-03-13 MED ORDER — ONDANSETRON 4 MG PO TBDP
4.0000 mg | ORAL_TABLET | Freq: Once | ORAL | Status: AC
  Administered 2021-03-13: 4 mg via ORAL
  Filled 2021-03-13: qty 1

## 2021-03-13 MED ORDER — ACETAMINOPHEN 500 MG PO TABS: 1000.0000 mg | ORAL_TABLET | Freq: Once | ORAL | Status: DC

## 2021-03-13 MED ORDER — ACETAMINOPHEN 500 MG PO TABS
1000.0000 mg | ORAL_TABLET | Freq: Once | ORAL | Status: AC
  Administered 2021-03-13: 1000 mg via ORAL
  Filled 2021-03-13: qty 2

## 2021-03-13 NOTE — ED Triage Notes (Signed)
Interpretor used- pt reported mid abd pain that radiates around all over abd. Had n/v but denies diarrhea.

## 2021-03-13 NOTE — ED Provider Notes (Addendum)
[facility] EMERGENCY DEPARTMENT Provider Note   CSN: [id number] Arrival date & time: 03/13/21  1241     History Chief Complaint  Patient presents with  . Abdominal Pain    Isaac Ray is a 50 y.o. male.  Patient with history of mural thrombus, appendectomy, limb ischemia, cardiomyopathy presents with intermittent abdominal discomfort throughout today.  Patient had nausea vomiting without diarrhea.  No blood in the stools.  No sick contacts known.        Past Medical History:  Diagnosis Date  . CAD (coronary artery disease)   . Essential hypertension   . Heart murmur    when I was a teenager- no mention of it now  . Hyperlipidemia   . 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 Type 2 diabetes mellitus (HCC)    Type II    Patient Active Problem List   Diagnosis Date Noted  . Long term (current) use of anticoagulants 04/30/2019  . 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 Acute appendicitis 04/25/2019  . S/P laparoscopic appendectomy 04/25/2019  . Gangrene of toe of left foot (HCC)   . Critical lower limb ischemia (HCC) 03/30/2019  . Ischemic cardiomyopathy 03/24/2019  . Critical limb ischemia with history of revascularization of same extremity (HCC) 03/24/2019  . Type 2 diabetes mellitus (HCC) 02/28/2017  . 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 Essential hypertension 04/20/2015  . Hyperlipidemia 04/20/2015  . Non-insulin dependent type 2 diabetes mellitus (HCC) 04/20/2015  . 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 NSTEMI (non-ST elevated myocardial infarction) (HCC) 02/12/2015    Past Surgical History:  Procedure Laterality Date  . AMPUTATION Left 04/08/2019   Procedure: LEFT FOOT 4TH TOE AMPUTATION;  Surgeon: Nadara Mustard, MD;  Location: [facility] OR;  Service: Orthopedics;  Laterality: Left;  . CORONARY ANGIOPLASTY WITH STENT PLACEMENT  2013  . CORONARY ARTERY BYPASS  GRAFT N/A 02/16/2015   Procedure: CORONARY ARTERY BYPASS GRAFTING (CABG) x5 using left internal mammary artery, right thigh greater saphenous vein, and left radial artery. ;  Surgeon: [name], MD;  Location: [facility] OR;  Service: Open Heart Surgery;  Laterality: N/A;  . LAPAROSCOPIC APPENDECTOMY N/A 04/25/2019   Procedure: APPENDECTOMY LAPAROSCOPIC;  Surgeon: Sheliah Hatch De Blanch, MD;  Location: [facility] OR;  Service: General;  Laterality: N/A;  . LEFT HEART CATHETERIZATION WITH CORONARY ANGIOGRAM N/A 02/14/2015   Procedure: LEFT HEART CATHETERIZATION WITH CORONARY ANGIOGRAM;  Surgeon: Runell Gess, MD;  Location: [facility] CATH LAB;  Service: Cardiovascular;  Laterality: N/A;  . LOWER EXTREMITY ANGIOGRAPHY N/A 03/30/2019   Procedure: LOWER EXTREMITY ANGIOGRAPHY;  Surgeon: Runell Gess, MD;  Location: [facility] INVASIVE CV LAB;  Service: Cardiovascular;  Laterality: N/A;  . RADIAL ARTERY HARVEST Left 02/16/2015   Procedure: RADIAL ARTERY HARVEST;  Surgeon: [name], MD;  Location: [facility] OR;  Service: Open Heart Surgery;  Laterality: Left;  . TEE WITHOUT CARDIOVERSION N/A 02/16/2015   Procedure: TRANSESOPHAGEAL ECHOCARDIOGRAM (TEE);  Surgeon: [name], MD;  Location: [facility] OR;  Service: Open Heart Surgery;  Laterality: N/A;       Family History  Problem Relation Age of Onset  . Coronary artery disease Father        had CABG  . Heart disease Father     Social History   Tobacco Use  . Smoking status: Former Smoker    Years: 30.00    Quit date: 02/16/2015  Years since quitting: 6.0  . Smokeless tobacco: Never Used  Vaping Use  . Vaping Use: Never used  Substance Use Topics  . Alcohol use: No    Alcohol/week: 0.0 standard drinks  . Drug use: No    Home Medications Prior to Admission medications   Medication Sig Start Date End Date Taking? Authorizing Provider  ondansetron (ZOFRAN ODT) 4 MG disintegrating tablet 4mg  ODT q4 hours prn nausea/vomit 03/13/21  Yes 03/15/21, MD  acetaminophen (TYLENOL) 325 MG tablet Take 2 tablets (650 mg total) by mouth every 6 (six) hours as needed for mild pain (or temp > 100). 04/27/19   Meuth, Brooke A, PA-C  atorvastatin (LIPITOR) 80 MG tablet TAKE 1 TABLET (80 MG TOTAL) BY MOUTH DAILY. 05/13/20 05/13/21  05/15/21, MD  carvedilol (COREG) 3.125 MG tablet TAKE 1 TABLET (3.125 MG TOTAL) BY MOUTH 2 (TWO) TIMES DAILY WITH A MEAL. 06/22/20 06/22/21  06/24/21, MD  clopidogrel (PLAVIX) 75 MG tablet TAKE 1 TABLET (75 MG TOTAL) BY MOUTH DAILY WITH BREAKFAST. 06/22/20 06/22/21  06/24/21, MD  empagliflozin (JARDIANCE) 10 MG TABS tablet TAKE 1 TABLET (10 MG TOTAL) BY MOUTH DAILY BEFORE BREAKFAST. 05/18/20 05/18/21  05/20/21, MD  glipiZIDE (GLUCOTROL XL) 2.5 MG 24 hr tablet Take 1 tablet (2.5 mg total) by mouth daily with breakfast. 05/18/20   05/20/20, MD  metFORMIN (GLUCOPHAGE) 1000 MG tablet TAKE 1 TABLET (1,000 MG TOTAL) BY MOUTH 2 (TWO) TIMES DAILY WITH A MEAL. 05/18/20 05/18/21  05/20/21, MD  sacubitril-valsartan (ENTRESTO) 24-26 MG TAKE 1 TABLET BY MOUTH 2 (TWO) TIMES DAILY. 06/22/20 06/22/21  06/24/21, MD  spironolactone (ALDACTONE) 25 MG tablet TAKE 1/2 TABLET BY MOUTH DAILY. 06/22/20 06/22/21  06/24/21, MD  warfarin (COUMADIN) 5 MG tablet TOME 1 OT 1 Y 1/2 TABLETAS AL DIA SEGUN INDICADO POR LA CLINICA DE ANTICOAGULATES. 09/21/20 09/21/21  09/23/21, MD    Allergies    Patient has no known allergies.  Review of Systems   Review of Systems  Constitutional: Negative for chills and fever.  HENT: Negative for congestion.   Eyes: Negative for visual disturbance.  Respiratory: Negative for shortness of breath.   Cardiovascular: Negative for chest pain.  Gastrointestinal: Positive for abdominal pain, nausea and vomiting.  Genitourinary: Negative for dysuria and flank pain.  Musculoskeletal: Negative for back pain, neck pain and neck stiffness.  Skin: Negative for rash.   Neurological: Negative for light-headedness and headaches.    Physical Exam Updated Vital Signs BP (!) 154/79   Pulse 66   Temp 97.9 F (36.6 C)   Resp 14   SpO2 96%   Physical Exam Vitals and nursing note reviewed.  Constitutional:      Appearance: He is well-developed.  HENT:     Head: Normocephalic and atraumatic.  Eyes:     General:        Right eye: No discharge.        Left eye: No discharge.     Conjunctiva/sclera: Conjunctivae normal.  Neck:     Trachea: No tracheal deviation.  Cardiovascular:     Rate and Rhythm: Normal rate and regular rhythm.  Pulmonary:     Effort: Pulmonary effort is normal.     Breath sounds: Normal breath sounds.  Abdominal:     General: There is no distension.     Palpations: Abdomen is soft.     Tenderness: There is abdominal tenderness in the  periumbilical area. There is no guarding.  Musculoskeletal:     Cervical back: Normal range of motion and neck supple.  Skin:    General: Skin is warm.     Findings: No rash.  Neurological:     Mental Status: He is alert and oriented to person, place, and time.     ED Results / Procedures / Treatments   Labs (all labs ordered are listed, but only abnormal results are displayed) Labs Reviewed  CBC WITH DIFFERENTIAL/PLATELET - Abnormal; Notable for the following components:      Result Value   WBC 12.8 (*)    Neutro Abs 9.0 (*)    All other components within normal limits  COMPREHENSIVE METABOLIC PANEL - Abnormal; Notable for the following components:   Glucose, Bld 212 (*)    All other components within normal limits  SARS CORONAVIRUS 2 (TAT 6-24 HRS)  LIPASE, BLOOD    EKG EKG Interpretation  Date/Time:  Monday Mar 13 2021 12:51:37 EDT Ventricular Rate:  45 PR Interval:  128 QRS Duration: 132 QT Interval:  472 QTC Calculation: 408 R Axis:   66 Text Interpretation: Sinus bradycardia Left ventricular hypertrophy with QRS widening and repolarization abnormality ( [employer]  product ) Lateral infarct , age undetermined Possible Inferior infarct , age undetermined Abnormal ECG no acute STEMI Confirmed by Marianna Fuss ([id number]) on 03/13/2021 12:58:40 PM   Radiology CT ABDOMEN PELVIS WO CONTRAST  Result Date: 03/13/2021 CLINICAL DATA:  Mid abdominal pain EXAM: CT ABDOMEN AND PELVIS WITHOUT CONTRAST TECHNIQUE: Multidetector CT imaging of the abdomen and pelvis was performed following the standard protocol without IV contrast. COMPARISON:  CT 04/25/2019 FINDINGS: Lower chest: Lung bases demonstrate no acute consolidation or pleural effusion. Coronary stent. Hepatobiliary: No focal liver abnormality is seen. No gallstones, gallbladder wall thickening, or biliary dilatation. Pancreas: Unremarkable. No pancreatic ductal dilatation or surrounding inflammatory changes. Spleen: Normal in size without focal abnormality. Adrenals/Urinary Tract: Adrenal glands are unremarkable. Kidneys are normal, without renal calculi, focal lesion, or hydronephrosis. Bladder is unremarkable. Stomach/Bowel: Stomach is within normal limits. Status post appendectomy. No evidence of bowel wall thickening, distention, or inflammatory changes. Vascular/Lymphatic: Mild to moderate aortic atherosclerosis. No aneurysm. No suspicious nodes Reproductive: Prostate is unremarkable. Other: Negative for free air or free fluid. Fat containing inguinal hernias Musculoskeletal: No acute or significant osseous findings. IMPRESSION: No CT evidence for acute intra-abdominal or pelvic abnormality Electronically Signed   By: Jasmine Pang M.D.   On: 03/13/2021 22:12    Procedures Procedures   Medications Ordered in ED Medications  ondansetron (ZOFRAN-ODT) disintegrating tablet 4 mg (4 mg Oral Given 03/13/21 2211)  acetaminophen (TYLENOL) tablet 1,000 mg (1,000 mg Oral Given 03/13/21 2210)    ED Course  I have reviewed the triage vital signs and the nursing notes.  Pertinent labs & imaging results that were available  during my care of the patient were reviewed by me and considered in my medical decision making (see chart for details).    MDM Rules/Calculators/A&P                          Patient presents with intermittent abdominal pain central nausea and vomiting.  Differential including gastroenteritis, pancreatitis, hepatitis, viral related, COVID.  Symptoms mild in the ER.  No chest pain or shortness of breath.  Interpreter used for discussion.  Blood work reviewed showing normal liver function, electrolytes unremarkable, mild elevated white blood cell count 12,000.  CT scan ordered  no acute abnormalities reviewed.  Patient stable for outpatient follow-up.  Outpatient COVID test ordered.  Patient has no urinary symptoms.  On reassessment patient's oxygen saturation mid 80s.  Patient denies shortness of breath.  With patient being off Coumadin, noncompliant medications cardiac work-up added with abdominal pain and hypoxia.  CT angio to look for pulm embolism, BNP, lactic acid and troponin added.  Patient care signed out to night clinician to follow-up results and reassess.   Final Clinical Impression(s) / ED Diagnoses Final diagnoses:  Non-intractable vomiting with nausea, unspecified vomiting type  Abdominal pain, unspecified abdominal location  Hypoxia    Rx / DC Orders ED Discharge Orders         Ordered    ondansetron (ZOFRAN ODT) 4 MG disintegrating tablet        03/13/21 2258           Blane Ohara, MD 03/13/21 5638    Blane Ohara, MD 03/13/21 2345

## 2021-03-13 NOTE — ED Provider Notes (Signed)
Emergency Medicine Provider Triage Evaluation Note  MATHEUS SPIKER , a 50 y.o. male  was evaluated in triage.  Pt complains of lower abdominal pain x 2 hours ago. He ha snot tried anything for pain control. Prior hx of chole or appendectomy, he is not sure. Endorses nausea but not vomiting   Review of Systems  Positive: Abdominal pain, nausea Negative: Vomiting, fever  Physical Exam  BP (!) 155/77 (BP Location: Right Arm)   Pulse (!) 44   Temp 97.9 F (36.6 C)   Resp 18   SpO2 96%  Gen:   Awake, no distress   Resp:  Normal effort  MSK:   Moves extremities without difficulty  Other:    Medical Decision Making  Medically screening exam initiated at 1:05 PM.  Appropriate orders placed.  Osias Resnick Lozano-Villareal was informed that the remainder of the evaluation will be completed by another provider, this initial triage assessment does not replace that evaluation, and the importance of remaining in the ED until their evaluation is complete.  Patient here with abdominal pain x 2 hour, no OTC treatments tried. Prior hx of appendectomy per chart review. Denies alcohol use.    Claude Manges, PA-C 03/13/21 1313    Milagros Loll, MD 03/15/21 2103

## 2021-03-13 NOTE — ED Notes (Signed)
Patient in CT

## 2021-03-13 NOTE — ED Notes (Signed)
Upon attempted to discharge patient, oxygen saturations on room air still in mid- 80s. ED MD made aware.

## 2021-03-13 NOTE — Discharge Instructions (Addendum)
Take tylenol every 4 hrs for pain 1000 mg. See your doctor to review your coumadin and other medications this week. Take zofran as needed for nausea and vomiting. Return for new concerns.  Tome tylenol cada 4 horas para el dolor 1000 mg. Consulte a su mdico para revisar su coumadin y otros medicamentos esta semana. Tome zofran segn sea necesario para las nuseas y los vmitos. Vuelve para nuevas inquietudes.

## 2021-03-14 ENCOUNTER — Emergency Department (HOSPITAL_COMMUNITY): Payer: Self-pay

## 2021-03-14 ENCOUNTER — Observation Stay (HOSPITAL_COMMUNITY): Payer: Self-pay

## 2021-03-14 ENCOUNTER — Encounter (HOSPITAL_COMMUNITY): Payer: Self-pay | Admitting: Family Medicine

## 2021-03-14 DIAGNOSIS — I255 Ischemic cardiomyopathy: Secondary | ICD-10-CM

## 2021-03-14 DIAGNOSIS — I513 Intracardiac thrombosis, not elsewhere classified: Secondary | ICD-10-CM

## 2021-03-14 DIAGNOSIS — R109 Unspecified abdominal pain: Secondary | ICD-10-CM

## 2021-03-14 DIAGNOSIS — R1084 Generalized abdominal pain: Secondary | ICD-10-CM

## 2021-03-14 DIAGNOSIS — J9601 Acute respiratory failure with hypoxia: Secondary | ICD-10-CM

## 2021-03-14 DIAGNOSIS — I779 Disorder of arteries and arterioles, unspecified: Secondary | ICD-10-CM

## 2021-03-14 DIAGNOSIS — E1159 Type 2 diabetes mellitus with other circulatory complications: Secondary | ICD-10-CM

## 2021-03-14 DIAGNOSIS — I5042 Chronic combined systolic (congestive) and diastolic (congestive) heart failure: Secondary | ICD-10-CM

## 2021-03-14 DIAGNOSIS — Z951 Presence of aortocoronary bypass graft: Secondary | ICD-10-CM

## 2021-03-14 LAB — ECHOCARDIOGRAM COMPLETE
AR max vel: 1.57 cm2
AV Area VTI: 1.44 cm2
AV Area mean vel: 1.54 cm2
AV Mean grad: 8 mmHg
AV Peak grad: 18.3 mmHg
Ao pk vel: 2.14 m/s
Area-P 1/2: 3.65 cm2
Height: 67 in
S' Lateral: 6 cm
Weight: 2648 oz

## 2021-03-14 LAB — PROTIME-INR
INR: 1 (ref 0.8–1.2)
Prothrombin Time: 13.6 seconds (ref 11.4–15.2)

## 2021-03-14 LAB — LACTIC ACID, PLASMA: Lactic Acid, Venous: 1.3 mmol/L (ref 0.5–1.9)

## 2021-03-14 LAB — COMPREHENSIVE METABOLIC PANEL
ALT: 20 U/L (ref 0–44)
AST: 19 U/L (ref 15–41)
Albumin: 3.8 g/dL (ref 3.5–5.0)
Alkaline Phosphatase: 64 U/L (ref 38–126)
Anion gap: 8 (ref 5–15)
BUN: 11 mg/dL (ref 6–20)
CO2: 24 mmol/L (ref 22–32)
Calcium: 9 mg/dL (ref 8.9–10.3)
Chloride: 101 mmol/L (ref 98–111)
Creatinine, Ser: 0.99 mg/dL (ref 0.61–1.24)
GFR, Estimated: >60 mL/min (ref >60)
Glucose, Bld: 281 mg/dL — ABNORMAL HIGH (ref 70–99)
Potassium: 3.8 mmol/L (ref 3.5–5.1)
Sodium: 133 mmol/L — ABNORMAL LOW (ref 135–145)
Total Bilirubin: 0.7 mg/dL (ref 0.3–1.2)
Total Protein: 7.1 g/dL (ref 6.5–8.1)

## 2021-03-14 LAB — CBC
HCT: 45.4 % (ref 39.0–52.0)
Hemoglobin: 15.4 g/dL (ref 13.0–17.0)
MCH: 31 pg (ref 26.0–34.0)
MCHC: 33.9 g/dL (ref 30.0–36.0)
MCV: 91.5 fL (ref 80.0–100.0)
Platelets: 190 10*3/uL (ref 150–400)
RBC: 4.96 MIL/uL (ref 4.22–5.81)
RDW: 13 % (ref 11.5–15.5)
WBC: 15.3 10*3/uL — ABNORMAL HIGH (ref 4.0–10.5)
nRBC: 0 % (ref 0.0–0.2)

## 2021-03-14 LAB — GLUCOSE, CAPILLARY
Glucose-Capillary: 181 mg/dL — ABNORMAL HIGH (ref 70–99)
Glucose-Capillary: 154 mg/dL — ABNORMAL HIGH (ref 70–99)

## 2021-03-14 LAB — BRAIN NATRIURETIC PEPTIDE: B Natriuretic Peptide: 475.8 pg/mL — ABNORMAL HIGH (ref 0.0–100.0)

## 2021-03-14 LAB — TROPONIN I (HIGH SENSITIVITY)
Troponin I (High Sensitivity): 21 ng/L — ABNORMAL HIGH (ref <18)
Troponin I (High Sensitivity): 24 ng/L — ABNORMAL HIGH (ref <18)

## 2021-03-14 LAB — CBG MONITORING, ED
Glucose-Capillary: 276 mg/dL — ABNORMAL HIGH (ref 70–99)
Glucose-Capillary: 179 mg/dL — ABNORMAL HIGH (ref 70–99)

## 2021-03-14 LAB — SARS CORONAVIRUS 2 (TAT 6-24 HRS): SARS Coronavirus 2: NEGATIVE

## 2021-03-14 LAB — HEPARIN LEVEL (UNFRACTIONATED): Heparin Unfractionated: <0.1 IU/mL — ABNORMAL LOW (ref 0.30–0.70)

## 2021-03-14 MED ORDER — HEPARIN (PORCINE) 25000 UT/250ML-% IV SOLN
1400.0000 [IU]/h | INTRAVENOUS | Status: DC
  Administered 2021-03-14: 1050 [IU]/h via INTRAVENOUS
  Administered 2021-03-15: 1450 [IU]/h via INTRAVENOUS
  Administered 2021-03-15: 1400 [IU]/h via INTRAVENOUS
  Administered 2021-03-16: 1450 [IU]/h via INTRAVENOUS
  Administered 2021-03-17: 1400 [IU]/h via INTRAVENOUS
  Filled 2021-03-14 (×5): qty 250

## 2021-03-14 MED ORDER — ATORVASTATIN CALCIUM 80 MG PO TABS
80.0000 mg | ORAL_TABLET | Freq: Every day | ORAL | Status: DC
  Administered 2021-03-14 – 2021-03-17 (×4): 80 mg via ORAL
  Filled 2021-03-14: qty 2
  Filled 2021-03-14 (×3): qty 1

## 2021-03-14 MED ORDER — IOHEXOL 350 MG/ML SOLN
75.0000 mL | Freq: Once | INTRAVENOUS | Status: AC | PRN
  Administered 2021-03-14: 75 mL via INTRAVENOUS

## 2021-03-14 MED ORDER — MORPHINE SULFATE (PF) 4 MG/ML IV SOLN: 3.0000 mg | INTRAVENOUS | Status: DC | PRN

## 2021-03-14 MED ORDER — SODIUM CHLORIDE 0.9% FLUSH
3.0000 mL | Freq: Two times a day (BID) | INTRAVENOUS | Status: DC
  Administered 2021-03-14 – 2021-03-17 (×4): 3 mL via INTRAVENOUS

## 2021-03-14 MED ORDER — FUROSEMIDE 40 MG PO TABS
40.0000 mg | ORAL_TABLET | Freq: Every day | ORAL | Status: DC
  Administered 2021-03-14 – 2021-03-15 (×2): 40 mg via ORAL
  Filled 2021-03-14: qty 2
  Filled 2021-03-14: qty 1

## 2021-03-14 MED ORDER — WARFARIN - PHARMACIST DOSING INPATIENT: Freq: Every day | Status: DC

## 2021-03-14 MED ORDER — ONDANSETRON HCL 4 MG/2ML IJ SOLN: 4.0000 mg | Freq: Four times a day (QID) | INTRAMUSCULAR | Status: DC | PRN

## 2021-03-14 MED ORDER — HYDROCODONE-ACETAMINOPHEN 5-325 MG PO TABS
1.0000 | ORAL_TABLET | ORAL | Status: DC | PRN
  Administered 2021-03-14: 1 via ORAL
  Filled 2021-03-14: qty 1

## 2021-03-14 MED ORDER — ACETAMINOPHEN 325 MG PO TABS
650.0000 mg | ORAL_TABLET | ORAL | Status: DC | PRN
  Administered 2021-03-15: 650 mg via ORAL
  Filled 2021-03-14 (×2): qty 2

## 2021-03-14 MED ORDER — SACUBITRIL-VALSARTAN 24-26 MG PO TABS
1.0000 | ORAL_TABLET | Freq: Two times a day (BID) | ORAL | Status: DC
  Administered 2021-03-14 – 2021-03-17 (×7): 1 via ORAL
  Filled 2021-03-14 (×8): qty 1

## 2021-03-14 MED ORDER — SODIUM CHLORIDE 0.9% FLUSH: 3.0000 mL | INTRAVENOUS | Status: DC | PRN

## 2021-03-14 MED ORDER — CLOPIDOGREL BISULFATE 75 MG PO TABS
75.0000 mg | ORAL_TABLET | Freq: Every day | ORAL | Status: DC
  Administered 2021-03-14 – 2021-03-17 (×4): 75 mg via ORAL
  Filled 2021-03-14 (×4): qty 1

## 2021-03-14 MED ORDER — WARFARIN SODIUM 7.5 MG PO TABS
7.5000 mg | ORAL_TABLET | ORAL | Status: AC
  Administered 2021-03-14: 7.5 mg via ORAL
  Filled 2021-03-14: qty 1

## 2021-03-14 MED ORDER — CARVEDILOL 3.125 MG PO TABS
3.1250 mg | ORAL_TABLET | Freq: Two times a day (BID) | ORAL | Status: DC
  Administered 2021-03-14 – 2021-03-17 (×6): 3.125 mg via ORAL
  Filled 2021-03-14 (×6): qty 1

## 2021-03-14 MED ORDER — EMPAGLIFLOZIN 10 MG PO TABS
10.0000 mg | ORAL_TABLET | Freq: Every day | ORAL | Status: DC
  Administered 2021-03-14 – 2021-03-15 (×2): 10 mg via ORAL
  Filled 2021-03-14 (×2): qty 1

## 2021-03-14 MED ORDER — PERFLUTREN LIPID MICROSPHERE
1.0000 mL | INTRAVENOUS | Status: AC | PRN
  Administered 2021-03-14: 5 mL via INTRAVENOUS
  Filled 2021-03-14: qty 10

## 2021-03-14 MED ORDER — FUROSEMIDE 10 MG/ML IJ SOLN
40.0000 mg | Freq: Two times a day (BID) | INTRAMUSCULAR | Status: DC
  Administered 2021-03-14: 40 mg via INTRAVENOUS
  Filled 2021-03-14: qty 4

## 2021-03-14 MED ORDER — INSULIN ASPART 100 UNIT/ML IJ SOLN
0.0000 [IU] | Freq: Three times a day (TID) | INTRAMUSCULAR | Status: DC
  Administered 2021-03-14: 2 [IU] via SUBCUTANEOUS
  Administered 2021-03-14 (×2): 5 [IU] via SUBCUTANEOUS
  Administered 2021-03-15: 3 [IU] via SUBCUTANEOUS
  Administered 2021-03-15 – 2021-03-16 (×3): 2 [IU] via SUBCUTANEOUS
  Administered 2021-03-16 (×2): 1 [IU] via SUBCUTANEOUS
  Administered 2021-03-17: 3 [IU] via SUBCUTANEOUS
  Administered 2021-03-17: 2 [IU] via SUBCUTANEOUS

## 2021-03-14 MED ORDER — PANTOPRAZOLE SODIUM 40 MG PO TBEC
40.0000 mg | DELAYED_RELEASE_TABLET | Freq: Two times a day (BID) | ORAL | Status: DC
  Administered 2021-03-14 – 2021-03-17 (×7): 40 mg via ORAL
  Filled 2021-03-14 (×7): qty 1

## 2021-03-14 MED ORDER — SODIUM CHLORIDE 0.9 % IV SOLN: 250.0000 mL | INTRAVENOUS | Status: DC | PRN

## 2021-03-14 NOTE — H&P (Signed)
History and Physical    Isaac Ray HAL:[id number] DOB: 05-10-1971 DOA: 03/13/2021  PCP: Hoy Register, MD   Patient coming from: Home   Chief Complaint: Abdominal pain   HPI: Isaac Ray is a 50 y.o. 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  Patient reports he been in his usual state of health until yesterday morning when he developed generalized abdominal pain.  He denies any fevers, chills, nausea, vomiting, or diarrhea.  He denies any dysuria.  He is unable to identify any alleviating or exacerbating factors.  He has never experienced this previously.  He denies chest pain.  Denies melena or hematochezia.  He reports continued adherence with his medications including warfarin.  ED Course: Upon arrival to the ED, patient is found to be afebrile, saturating mid to upper 80s on room air, and with stable blood pressure.  EKG features sinus bradycardia and LVH.  Chest x-ray notable for interval development of mild cardiomegaly.  CT the abdomen and pelvis is negative for acute intra-abdominal or pelvic abnormality.  CTA chest demonstrates mild cardiomegaly with marked LV dilation and diffuse bronchial wall thickening.  Chemistry panel notable for glucose of 212.  CBC with leukocytosis to 12,800.  Lactic acid is normal.  High-sensitivity troponin is 21 and BNP 476.  Patient was started on supplemental oxygen in the ED.  Review of Systems:  All other systems reviewed and apart from HPI, are negative.  Past Medical History:  Diagnosis Date  . CAD (coronary artery disease)   . Essential hypertension   . Heart murmur    when I was a teenager- no mention of it now  . Hyperlipidemia   . 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 Type 2 diabetes  mellitus (HCC)    Type II    Past Surgical History:  Procedure Laterality Date  . AMPUTATION Left 04/08/2019   Procedure: LEFT FOOT 4TH TOE AMPUTATION;  Surgeon: Nadara Mustard, MD;  Location: [facility] OR;  Service: Orthopedics;  Laterality: Left;  . CORONARY ANGIOPLASTY WITH STENT PLACEMENT  2013  . CORONARY ARTERY BYPASS GRAFT N/A 02/16/2015   Procedure: CORONARY ARTERY BYPASS GRAFTING (CABG) x5 using left internal mammary artery, right thigh greater saphenous vein, and left radial artery. ;  Surgeon: [name], MD;  Location: [facility] OR;  Service: Open Heart Surgery;  Laterality: N/A;  . LAPAROSCOPIC APPENDECTOMY N/A 04/25/2019   Procedure: APPENDECTOMY LAPAROSCOPIC;  Surgeon: Sheliah Hatch De Blanch, MD;  Location: [facility] OR;  Service: General;  Laterality: N/A;  . LEFT HEART CATHETERIZATION WITH CORONARY ANGIOGRAM N/A 02/14/2015   Procedure: LEFT HEART CATHETERIZATION WITH CORONARY ANGIOGRAM;  Surgeon: Runell Gess, MD;  Location: [facility] CATH LAB;  Service: Cardiovascular;  Laterality: N/A;  . LOWER EXTREMITY ANGIOGRAPHY N/A 03/30/2019   Procedure: LOWER EXTREMITY ANGIOGRAPHY;  Surgeon: Runell Gess, MD;  Location: [facility] INVASIVE CV LAB;  Service: Cardiovascular;  Laterality: N/A;  . RADIAL ARTERY HARVEST Left 02/16/2015   Procedure: RADIAL ARTERY HARVEST;  Surgeon: [name], MD;  Location: [facility] OR;  Service: Open Heart Surgery;  Laterality: Left;  . TEE WITHOUT CARDIOVERSION N/A 02/16/2015   Procedure: TRANSESOPHAGEAL ECHOCARDIOGRAM (TEE);  Surgeon: [name], MD;  Location: [facility] OR;  Service: Open Heart Surgery;  Laterality: N/A;    Social History:   reports  that he quit smoking about 6 years ago. He quit after 30.00 years of use. He has never used smokeless tobacco. He reports that he does not drink alcohol and does not use drugs.  No Known Allergies  Family History  Problem Relation Age of Onset  . Coronary artery disease Father        had CABG  . Heart disease Father       Prior to Admission medications   Medication Sig Start Date End Date Taking? Authorizing Provider  acetaminophen (TYLENOL) 325 MG tablet Take 2 tablets (650 mg total) by mouth every 6 (six) hours as needed for mild pain (or temp > 100). 04/27/19  Yes Meuth, Brooke A, PA-C  atorvastatin (LIPITOR) 80 MG tablet TAKE 1 TABLET (80 MG TOTAL) BY MOUTH DAILY. Patient taking differently: Take 80 mg by mouth daily. 05/13/20 05/13/21 Yes Runell Gess, MD  carvedilol (COREG) 3.125 MG tablet TAKE 1 TABLET (3.125 MG TOTAL) BY MOUTH 2 (TWO) TIMES DAILY WITH A MEAL. Patient taking differently: Take 3.125 mg by mouth 2 (two) times daily with a meal. 06/22/20 06/22/21 Yes Runell Gess, MD  clopidogrel (PLAVIX) 75 MG tablet TAKE 1 TABLET (75 MG TOTAL) BY MOUTH DAILY WITH BREAKFAST. Patient taking differently: Take 75 mg by mouth daily with breakfast. 06/22/20 06/22/21 Yes Runell Gess, MD  empagliflozin (JARDIANCE) 10 MG TABS tablet TAKE 1 TABLET (10 MG TOTAL) BY MOUTH DAILY BEFORE BREAKFAST. Patient taking differently: Take 10 mg by mouth daily before breakfast. 05/18/20 05/18/21 Yes Newlin, Odette Horns, MD  glipiZIDE (GLUCOTROL XL) 2.5 MG 24 hr tablet Take 1 tablet (2.5 mg total) by mouth daily with breakfast. 05/18/20  Yes Newlin, Enobong, MD  metFORMIN (GLUCOPHAGE) 1000 MG tablet TAKE 1 TABLET (1,000 MG TOTAL) BY MOUTH 2 (TWO) TIMES DAILY WITH A MEAL. Patient taking differently: Take 1,000 mg by mouth 2 (two) times daily with a meal. 05/18/20 05/18/21 Yes Newlin, Odette Horns, MD  ondansetron (ZOFRAN ODT) 4 MG disintegrating tablet 4mg  ODT q4 hours prn nausea/vomit 03/13/21  Yes Blane Ohara, MD  sacubitril-valsartan (ENTRESTO) 24-26 MG TAKE 1 TABLET BY MOUTH 2 (TWO) TIMES DAILY. 06/22/20 06/22/21 Yes Runell Gess, MD  spironolactone (ALDACTONE) 25 MG tablet TAKE 1/2 TABLET BY MOUTH DAILY. Patient taking differently: Take 12.5 mg by mouth daily. 06/22/20 06/22/21 Yes Runell Gess, MD  warfarin (COUMADIN)  5 MG tablet TOME 1 OT 1 Y 1/2 TABLETAS AL DIA SEGUN INDICADO POR LA CLINICA DE ANTICOAGULATES. Patient taking differently: Take 7.5 mg by mouth See admin instructions. 7.5 mg Monday,Wednesday and Friday. 5 mg  Tuesday,Thursday,Saturday and sunday 09/21/20 09/21/21 Yes Runell Gess, MD    Physical Exam: Vitals:   03/14/21 0330 03/14/21 0345 03/14/21 0400 03/14/21 0415  BP: (!) 163/83 (!) 168/76 (!) 146/64 (!) 150/74  Pulse: (!) 49 (!) 54 (!) 47 (!) 52  Resp: (!) 22 (!) 25 (!) 22 17  Temp:      SpO2: 94% 93% 95% 95%    Constitutional: NAD, calm  Eyes: PERTLA, lids and conjunctivae normal ENMT: Mucous membranes are moist. Posterior pharynx clear of any exudate or lesions.   Neck: supple, no masses  Respiratory: no wheezing, no crackles. No accessory muscle use.  Cardiovascular: S1 & S2 heard, regular rate and rhythm. No extremity edema. No significant JVD. Abdomen: No distension, soft, generally tender without rebound pain or guarding. Bowel sounds active.  Musculoskeletal: no clubbing / cyanosis. No joint deformity upper and lower extremities.   Skin:  no significant rashes, lesions, ulcers. Warm, dry, well-perfused. Neurologic: CN 2-12 grossly intact. Sensation intact. Moving all extremities.  Psychiatric: Alert and oriented to person, place, and situation. Pleasant and cooperative.    Labs and Imaging on Admission: I have personally reviewed following labs and imaging studies  CBC: Recent Labs  Lab 03/13/21 1308  WBC 12.8*  NEUTROABS 9.0*  HGB 14.6  HCT 44.0  MCV 94.4  PLT 187   Basic Metabolic Panel: Recent Labs  Lab 03/13/21 1308  NA 137  K 4.2  CL 107  CO2 22  GLUCOSE 212*  BUN 14  CREATININE 1.13  CALCIUM 9.1   GFR: CrCl cannot be calculated (Unknown ideal weight.). Liver Function Tests: Recent Labs  Lab 03/13/21 1308  AST 15  ALT 19  ALKPHOS 58  BILITOT 0.6  PROT 7.0  ALBUMIN 3.7   Recent Labs  Lab 03/13/21 1308  LIPASE 33   No results  for input(s): AMMONIA in the last 168 hours. Coagulation Profile: No results for input(s): INR, PROTIME in the last 168 hours. Cardiac Enzymes: No results for input(s): CKTOTAL, CKMB, CKMBINDEX, TROPONINI in the last 168 hours. BNP (last 3 results) No results for input(s): PROBNP in the last 8760 hours. HbA1C: No results for input(s): HGBA1C in the last 72 hours. CBG: No results for input(s): GLUCAP in the last 168 hours. Lipid Profile: No results for input(s): CHOL, HDL, LDLCALC, TRIG, CHOLHDL, LDLDIRECT in the last 72 hours. Thyroid Function Tests: No results for input(s): TSH, T4TOTAL, FREET4, T3FREE, THYROIDAB in the last 72 hours. Anemia Panel: No results for input(s): VITAMINB12, FOLATE, FERRITIN, TIBC, IRON, RETICCTPCT in the last 72 hours. Urine analysis:    Component Value Date/Time   COLORURINE YELLOW 02/15/2015 1356   APPEARANCEUR CLEAR 02/15/2015 1356   LABSPEC 1.020 02/15/2015 1356   PHURINE 6.0 02/15/2015 1356   GLUCOSEU 250 (A) 02/15/2015 1356   HGBUR NEGATIVE 02/15/2015 1356   BILIRUBINUR negative 04/22/2019 1242   KETONESUR negative 04/22/2019 1242   KETONESUR NEGATIVE 02/15/2015 1356   PROTEINUR NEGATIVE 02/15/2015 1356   UROBILINOGEN 0.2 04/22/2019 1242   UROBILINOGEN 2.0 (H) 02/15/2015 1356   NITRITE Negative 04/22/2019 1242   NITRITE NEGATIVE 02/15/2015 1356   LEUKOCYTESUR Negative 04/22/2019 1242   Sepsis Labs: @LABRCNTIP (procalcitonin:4,lacticidven:4) )No results found for this or any previous visit (from the past 240 hour(s)).   Radiological Exams on Admission: CT ABDOMEN PELVIS WO CONTRAST  Result Date: 03/13/2021 CLINICAL DATA:  Mid abdominal pain EXAM: CT ABDOMEN AND PELVIS WITHOUT CONTRAST TECHNIQUE: Multidetector CT imaging of the abdomen and pelvis was performed following the standard protocol without IV contrast. COMPARISON:  CT 04/25/2019 FINDINGS: Lower chest: Lung bases demonstrate no acute consolidation or pleural effusion. Coronary stent.  Hepatobiliary: No focal liver abnormality is seen. No gallstones, gallbladder wall thickening, or biliary dilatation. Pancreas: Unremarkable. No pancreatic ductal dilatation or surrounding inflammatory changes. Spleen: Normal in size without focal abnormality. Adrenals/Urinary Tract: Adrenal glands are unremarkable. Kidneys are normal, without renal calculi, focal lesion, or hydronephrosis. Bladder is unremarkable. Stomach/Bowel: Stomach is within normal limits. Status post appendectomy. No evidence of bowel wall thickening, distention, or inflammatory changes. Vascular/Lymphatic: Mild to moderate aortic atherosclerosis. No aneurysm. No suspicious nodes Reproductive: Prostate is unremarkable. Other: Negative for free air or free fluid. Fat containing inguinal hernias Musculoskeletal: No acute or significant osseous findings. IMPRESSION: No CT evidence for acute intra-abdominal or pelvic abnormality Electronically Signed   By: 04/27/2019 M.D.   On: 03/13/2021 22:12   CT  Angio Chest PE W and/or Wo Contrast  Result Date: 03/14/2021 CLINICAL DATA:  Hypoxia EXAM: CT ANGIOGRAPHY CHEST WITH CONTRAST TECHNIQUE: Multidetector CT imaging of the chest was performed using the standard protocol during bolus administration of intravenous contrast. Multiplanar CT image reconstructions and MIPs were obtained to evaluate the vascular anatomy. CONTRAST:  75mL OMNIPAQUE IOHEXOL 350 MG/ML SOLN COMPARISON:  None. FINDINGS: Cardiovascular: There is excellent opacification of the pulmonary arterial tree. No intraluminal filling defect identified to suggest acute pulmonary embolism. Central pulmonary arteries are of normal caliber. There is mild cardiomegaly with left ventricular dilation noted. Subendocardial fatty infiltration and thinning of the left ventricular apex suggests prior myocardial infarction in this location. Coronary artery bypass grafting and coronary artery stenting has been performed. No pericardial effusion. The  thoracic aorta is unremarkable. Mediastinum/Nodes: Thyroid unremarkable. Esophagus unremarkable. Shotty right hilar adenopathy may be reactive in nature. No frankly pathologic thoracic adenopathy. Lungs/Pleura: Mild bibasilar atelectasis. No superimposed confluent pulmonary infiltrate or suspicious focal pulmonary nodule. No pneumothorax or pleural effusion. There is diffuse bronchial wall thickening noted centrally in keeping with airway inflammation. A small amount of debris is seen within the a distal trachea. Upper Abdomen: No acute abnormality. Musculoskeletal: No acute abnormality. No lytic or blastic bone lesion. Review of the MIP images confirms the above findings. IMPRESSION: No pulmonary embolism. Mild cardiomegaly with marked left ventricular dilation. Subendocardial fatty infiltration and thinning of the a left ventricular apex suggest prior myocardial infarction in this location. Diffuse bronchial wall thickening in keeping with airway inflammation. If acute, this can be seen the setting of atypical infection or reactive airway disease. No confluent pulmonary infiltrate. No central obstructing lesion. Electronically Signed   By: Helyn NumbersAshesh  Parikh MD   On: 03/14/2021 01:25   DG Chest Portable 1 View  Result Date: 03/14/2021 CLINICAL DATA:  Hypoxia EXAM: PORTABLE CHEST 1 VIEW COMPARISON:  05/03/2015 FINDINGS: Lungs are clear. No pneumothorax or pleural effusion. Coronary artery bypass grafting has been performed. Mild cardiomegaly has developed, new since prior examination. Pulmonary vascularity is normal. No acute bone abnormality. IMPRESSION: Interval development of mild cardiomegaly. Electronically Signed   By: Helyn NumbersAshesh  Parikh MD   On: 03/14/2021 00:16    EKG: Independently reviewed. Sinus bradycardia, LVH.   Assessment/Plan   1. 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 Abdominal pain  - Presents with one day of generalized abdominal pain without fever, N/V, or diarrhea, and is found to have normal lactate, normal LFTs and lipase, and no acute findings on CT  - Etiology unclear, continue supportive care, consider angiography   3. Chronic combined systolic & diastolic CHF - EF was 25-30% in January 2021  - BNP is 476 on admission and interval development of cardiomegaly noted on CXR in ED   - He has new supplemental O2 requirement, no peripheral edema noted  - Update echo, trial Lasix, continue Entresto, Coreg as tolerated, and Jardiance   4. History of LV mural thrombus  - Not seen on echo with contrast from January 2021  - Check INR and continue warfarin for now    5. CAD  - No anginal complaints  - Continue Plavix and  Lipitor   6. Type II DM  - A1c was 7.6% in July 2021  - Continue Jardiance, check CBGs, use SSI if needed   7. PAD  - No acute ischemia, continue Plavix and Lipitor     DVT prophylaxis: Warfarin  Code Status: Full  Level of Care: Level of care: Telemetry Cardiac Family Communication: None present  Disposition Plan:  Patient is from: Home  Anticipated d/c is to: Home  Anticipated d/c date is: 5/18 or 03/16/21 Patient currently: Pending echocardiogram, stable oxygenation on room air or home O2  Consults called: none  Admission status: Observation     Briscoe Deutscher, MD [facility]  03/14/2021, 4:31 AM

## 2021-03-14 NOTE — ED Provider Notes (Signed)
Patient signed out to me by Dr. Jodi Mourning.  He was seen for abdominal pain earlier.  Patient underwent CT abdomen and pelvis that did not show any acute abnormality.  Lab work was reassuring.  Patient became hypoxic during the evaluation, however.  He does not perceive any shortness of breath, however drops to 85% off of oxygen currently.  He is doing better on 2 L by nasal cannula.  Chest x-ray shows cardiomegaly and BNP is elevated.  CT angiography of chest does not show any evidence of PE but there is dilation of the left ventricle.  We will give Lasix and admit patient.  Patient still complaining of abdominal pain.  Pain is diffuse.  He has diffuse tenderness but no guarding or rebound.  There is a slight leukocytosis.  LFTs and lipase are normal.  Lactic acid is normal, reassuring that he is not experiencing bowel ischemia.   Gilda Crease, MD 03/14/21 [phone]

## 2021-03-14 NOTE — Progress Notes (Signed)
ANTICOAGULATION CONSULT NOTE - Initial Consult  Pharmacy Consult for coumadin + heparin infusion Indication: h/o mural thrombosis  No Known Allergies   Vital Signs: BP: 154/79 (05/17 0645) Pulse Rate: 50 (05/17 0645)  Labs: Recent Labs    03/13/21 1308 03/13/21 2314 03/14/21 0421  HGB 14.6  --   --   HCT 44.0  --   --   PLT 187  --   --   LABPROT  --   --  13.6  INR  --   --  1.0  CREATININE 1.13  --   --   TROPONINIHS  --  21* 24*    CrCl cannot be calculated (Unknown ideal weight.).   Medical History: Past Medical History:  Diagnosis Date  . CAD (coronary artery disease)   . Essential hypertension   . Heart murmur    when I was a teenager- no mention of it now  . Hyperlipidemia   . 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 Type 2 diabetes mellitus (HCC)    Type II    Medications:  See medication history  Assessment: 50 yo man to continue coumadin for hx LV thrombus (last ECHO in 2021 no longer showing evidence of apical thrombus).  Last known dose 7.5 mg MWF and 5mg  other days.    Given subthearpeutic INR - MD wants to start enxoaparin until INR therapeutic and while ruling out abdominal pain. CBC stable, no s/sx of bleeding.   Goal of Therapy:  INR 2-3 Monitor platelets by anticoagulation protocol: Yes   Plan:  Coumadin 7.5 mg today (already given) Start heparin 1050 units/hr >> 6 hr HL  Daily PT/INR, HL, and CBC  , PharmD, BCCCP Clinical Pharmacist  Phone: [phone] 03/14/2021 8:54 AM  Please check AMION for all [facility] Pharmacy phone numbers After 10:00 PM, call Main Pharmacy [phone]

## 2021-03-14 NOTE — ED Notes (Signed)
Report given Mittie Bodo, RN of [phone]

## 2021-03-14 NOTE — Progress Notes (Signed)
ANTICOAGULATION CONSULT NOTE - Initial Consult  Pharmacy Consult for coumadin Indication: h/o mural thrombosis  No Known Allergies   Vital Signs: BP: 148/74 (05/17 0445) Pulse Rate: 50 (05/17 0445)  Labs: Recent Labs    03/13/21 1308 03/13/21 2314 03/14/21 0421  HGB 14.6  --   --   HCT 44.0  --   --   PLT 187  --   --   LABPROT  --   --  13.6  INR  --   --  1.0  CREATININE 1.13  --   --   TROPONINIHS  --  21*  --     CrCl cannot be calculated (Unknown ideal weight.).   Medical History: Past Medical History:  Diagnosis Date  . CAD (coronary artery disease)   . Essential hypertension   . Heart murmur    when I was a teenager- no mention of it now  . Hyperlipidemia   . 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 Type 2 diabetes mellitus (HCC)    Type II    Medications:  See medication history  Assessment: 50 yo man to continue coumadin.  Last known dose 7.5 mg MWF and 5mg  other days.  Admission INR 1.0, Hg 14.6 Goal of Therapy:  INR 2-3 Monitor platelets by anticoagulation protocol: Yes   Plan:  Coumadin 7.5 mg po  Now. Daily PT/INR  Tequita Marrs Poteet 03/14/2021,4:59 AM

## 2021-03-14 NOTE — Progress Notes (Signed)
Patient received to the unit. Patient is alert and oriented x4. Vital signs are stable. Skin assessment done with another nurse. Iv in place. Given instructions about call bell and phone. Bed in low position and call bell in reach.

## 2021-03-14 NOTE — Progress Notes (Signed)
ANTICOAGULATION CONSULT NOTE - Initial Consult  Pharmacy Consult for coumadin + heparin infusion Indication: h/o mural thrombosis  No Known Allergies   Vital Signs: Temp: 98.1 F (36.7 C) (05/17 1329) Temp Source: Oral (05/17 1329) BP: 140/67 (05/17 1329) Pulse Rate: 57 (05/17 1329)  Labs: Recent Labs    03/13/21 1308 03/13/21 2314 03/14/21 0421 03/14/21 0724 03/14/21 1427  HGB 14.6  --   --  15.4  --   HCT 44.0  --   --  45.4  --   PLT 187  --   --  190  --   LABPROT  --   --  13.6  --   --   INR  --   --  1.0  --   --   HEPARINUNFRC  --   --   --   --  <0.10*  CREATININE 1.13  --   --  0.99  --   TROPONINIHS  --  21* 24*  --   --     Estimated Creatinine Clearance: 89 mL/min (by C-G formula based on SCr of 0.99 mg/dL).   Medical History: Past Medical History:  Diagnosis Date  . CAD (coronary artery disease)   . Essential hypertension   . Heart murmur    when I was a teenager- no mention of it now  . Hyperlipidemia   . 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 Type 2 diabetes mellitus (HCC)    Type II    Medications:  See medication history  Assessment: 50 yo man to continue coumadin for hx LV thrombus (last ECHO in 2021 no longer showing evidence of apical thrombus).  Last known dose 7.5 mg MWF and 5mg  other days.    Given subthearpeutic INR - MD wants to start heparin until INR therapeutic and while ruling out abdominal pain. CBC stable, no s/sx of bleeding.   Heparin level undetectable.  Goal of Therapy:  INR 2-3 Monitor platelets by anticoagulation protocol: Yes   Plan:  Increase heparin to 1200 units/hr Obtain 6 hr HL  Daily PT/INR, HL, and CBC  , PharmD, Jeanella Cara Clinical Pharmacist Please see AMION for all Pharmacists' Contact Phone Numbers 03/14/2021, 4:15 PM

## 2021-03-14 NOTE — Progress Notes (Signed)
PROGRESS NOTE        PATIENT DETAILS Name: Isaac Ray Age: 50 y.o. Sex: male Date of Birth: 12/12/1970 Admit Date: 03/13/2021 Admitting Physician Briscoe Deutscher, MD DGL:OVFIEP, Odette Horns, MD  Brief Narrative: Patient is a 50 y.o. 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  Significant events: 5/16>> admit for evaluation of abdominal pain.  Significant studies: 5/16>> CT abdomen/pelvis (no contrast): No acute abdominal/pelvic abnormality. 5/17>> CT angio chest: No PE, mild cardiomegaly, diffuse bronchial wall thickening.  Antimicrobial therapy: None  Microbiology data: 5/16>> COVID-19 PCR: Negative  Procedures : None  Consults: None  DVT Prophylaxis : IV heparin/Coumadin   Subjective: Continues to have abdominal pain-pain is diffuse.  No nausea vomiting.  Last BM yesterday.  Denies any shortness of breath.  (Rounded with iPad translator at bedside)   Assessment/Plan: Abdominal pain: Unclear etiology-patient has diffuse tenderness but no peritoneal signs on exam.  He appears comfortable.  Lactate/bicarb levels within normal limit as well.  Reviewed CT abdomen with radiology (Dr. Fritz Pickerel noncontrast study-no obvious bowel thickening/no free fluid or any obvious abnormalities.  We will start PPI-provide supportive care-and plan on following clinically-with plans to repeat CT imaging in the next 24 hours if he continues to have abdominal pain.  Leukocytosis: Obtain blood cultures-concern for intra-abdominal infection-but nothing evident on CT scan.  Since appears relatively stable-watch closely.  Low threshold to start empiric antimicrobial therapy if clinical deterioration occurs.  Acute hypoxic respiratory failure due to HFrEF with mild exacerbation: Mild hypoxia-really no signs of volume  overload-switch to oral Lasix.  Attempt to titrate off oxygen.  HFrEF with mild exacerbation: Euvolemic this morning-change to oral Lasix.  Continue Entresto, Coreg.  CAD s/p CABG: No anginal symptoms-continue Plavix/statin/Coreg  History of PAD: Continue antiplatelet/statin.  History of LV thrombus: Noncompliant to Coumadin-claims he stopped taking for approximately 2 weeks when he was in [place] city.  Await repeat echo.  Continue IV heparin-and overlapping Coumadin.  DM-2: Follow CBGs-continue SSI-and add long-acting insulin accordingly.  Recent Labs    03/14/21 0809  GLUCAP 276*   Diet: Diet Order            Diet heart healthy/carb modified Room service appropriate? Yes; Fluid consistency: Thin  Diet effective now                  Code Status: Full code   Family Communication: None at bedside.  Disposition Plan: Status is: Observation  The patient will require care spanning > 2 midnights and should be moved to inpatient because: Inpatient level of care appropriate due to severity of illness  Dispo: The patient is from: Home              Anticipated d/c is to: Home              Patient currently is not medically stable to d/c.   Difficult to place patient No   Barriers to Discharge: Unexplained abdominal pain-needs continued inpatient monitoring-potential further work-up pending.  Antimicrobial agents: Anti-infectives (From admission, onward)   None       Time spent: 35- minutes-Greater than 50% of this time was spent in counseling, explanation of diagnosis, planning of further management, and coordination of care.  MEDICATIONS: Scheduled Meds: .  atorvastatin  80 mg Oral Daily  . carvedilol  3.125 mg Oral BID WC  . clopidogrel  75 mg Oral Q breakfast  . empagliflozin  10 mg Oral QAC breakfast  . furosemide  40 mg Intravenous Q12H  . insulin aspart  0-9 Units Subcutaneous TID WC  . pantoprazole  40 mg Oral BID  . sacubitril-valsartan  1 tablet Oral  BID  . sodium chloride flush  3 mL Intravenous Q12H  . Warfarin - Pharmacist Dosing Inpatient   Does not apply q1600   Continuous Infusions: . sodium chloride    . heparin 1,050 Units/hr (03/14/21 0920)   PRN Meds:.sodium chloride, acetaminophen, HYDROcodone-acetaminophen, morphine injection, ondansetron (ZOFRAN) IV, sodium chloride flush   PHYSICAL EXAM: Vital signs: Vitals:   03/14/21 0845 03/14/21 0900 03/14/21 0915 03/14/21 0930  BP:  (!) 158/83 (!) 152/80 (!) 156/76  Pulse:  (!) 59 (!) 48 (!) 48  Resp:  (!) 23 18 17   Temp:      SpO2:  94% 96% 95%  Weight: 75.1 kg     Height: 5\' 7"  (1.702 m)      Filed Weights   03/14/21 0845  Weight: 75.1 kg   Body mass index is 25.92 kg/m.   Gen Exam:Alert awake-not in any distress HEENT:atraumatic, normocephalic Chest: B/L clear to auscultation anteriorly CVS:S1S2 regular Abdomen: Soft-no guarding-mildly but diffusely tender-no rebound or any sort of peritoneal signs. Extremities:no edema Neurology: Non focal Skin: no rash  I have personally reviewed following labs and imaging studies  LABORATORY DATA: CBC: Recent Labs  Lab 03/13/21 1308 03/14/21 0724  WBC 12.8* 15.3*  NEUTROABS 9.0*  --   HGB 14.6 15.4  HCT 44.0 45.4  MCV 94.4 91.5  PLT 187 190    Basic Metabolic Panel: Recent Labs  Lab 03/13/21 1308 03/14/21 0724  NA 137 133*  K 4.2 3.8  CL 107 101  CO2 22 24  GLUCOSE 212* 281*  BUN 14 11  CREATININE 1.13 0.99  CALCIUM 9.1 9.0    GFR: Estimated Creatinine Clearance: 82.5 mL/min (by C-G formula based on SCr of 0.99 mg/dL).  Liver Function Tests: Recent Labs  Lab 03/13/21 1308 03/14/21 0724  AST 15 19  ALT 19 20  ALKPHOS 58 64  BILITOT 0.6 0.7  PROT 7.0 7.1  ALBUMIN 3.7 3.8   Recent Labs  Lab 03/13/21 1308  LIPASE 33   No results for input(s): AMMONIA in the last 168 hours.  Coagulation Profile: Recent Labs  Lab 03/14/21 0421  INR 1.0    Cardiac Enzymes: No results for  input(s): CKTOTAL, CKMB, CKMBINDEX, TROPONINI in the last 168 hours.  BNP (last 3 results) No results for input(s): PROBNP in the last 8760 hours.  Lipid Profile: No results for input(s): CHOL, HDL, LDLCALC, TRIG, CHOLHDL, LDLDIRECT in the last 72 hours.  Thyroid Function Tests: No results for input(s): TSH, T4TOTAL, FREET4, T3FREE, THYROIDAB in the last 72 hours.  Anemia Panel: No results for input(s): VITAMINB12, FOLATE, FERRITIN, TIBC, IRON, RETICCTPCT in the last 72 hours.  Urine analysis:    Component Value Date/Time   COLORURINE YELLOW 02/15/2015 1356   APPEARANCEUR CLEAR 02/15/2015 1356   LABSPEC 1.020 02/15/2015 1356   PHURINE 6.0 02/15/2015 1356   GLUCOSEU 250 (A) 02/15/2015 1356   HGBUR NEGATIVE 02/15/2015 1356   BILIRUBINUR negative 04/22/2019 1242   KETONESUR negative 04/22/2019 1242   KETONESUR NEGATIVE 02/15/2015 1356   PROTEINUR NEGATIVE 02/15/2015 1356   UROBILINOGEN 0.2 04/22/2019 1242  UROBILINOGEN 2.0 (H) 02/15/2015 1356   NITRITE Negative 04/22/2019 1242   NITRITE NEGATIVE 02/15/2015 1356   LEUKOCYTESUR Negative 04/22/2019 1242    Sepsis Labs: Lactic Acid, Venous    Component Value Date/Time   LATICACIDVEN 1.3 03/14/2021 0104    MICROBIOLOGY: Recent Results (from the past 240 hour(s))  SARS CORONAVIRUS 2 (TAT 6-24 HRS) Nasopharyngeal Nasopharyngeal Swab     Status: None   Collection Time: 03/13/21 10:36 PM   Specimen: Nasopharyngeal Swab  Result Value Ref Range Status   SARS Coronavirus 2 NEGATIVE NEGATIVE Final    Comment: (NOTE) SARS-CoV-2 target nucleic acids are NOT DETECTED.  The SARS-CoV-2 RNA is generally detectable in upper and lower respiratory specimens during the acute phase of infection. Negative results do not preclude SARS-CoV-2 infection, do not rule out co-infections with other pathogens, and should not be used as the sole basis for treatment or other patient management decisions. Negative results must be combined with  clinical observations, patient history, and epidemiological information. The expected result is Negative.  Fact Sheet for Patients: HairSlick.no  Fact Sheet for Healthcare Providers: quierodirigir.com  This test is not yet approved or cleared by the [place] FDA and  has been authorized for detection and/or diagnosis of SARS-CoV-2 by FDA under an Emergency Use Authorization (EUA). This EUA will remain  in effect (meaning this test can be used) for the duration of the COVID-19 declaration under Se ction 564(b)(1) of the Act, 21 U.S.C. section 360bbb-3(b)(1), unless the authorization is terminated or revoked sooner.  Performed at [facility], [address], [place], [place] [address]     RADIOLOGY STUDIES/RESULTS: CT ABDOMEN PELVIS WO CONTRAST  Result Date: 03/13/2021 CLINICAL DATA:  Mid abdominal pain EXAM: CT ABDOMEN AND PELVIS WITHOUT CONTRAST TECHNIQUE: Multidetector CT imaging of the abdomen and pelvis was performed following the standard protocol without IV contrast. COMPARISON:  CT 04/25/2019 FINDINGS: Lower chest: Lung bases demonstrate no acute consolidation or pleural effusion. Coronary stent. Hepatobiliary: No focal liver abnormality is seen. No gallstones, gallbladder wall thickening, or biliary dilatation. Pancreas: Unremarkable. No pancreatic ductal dilatation or surrounding inflammatory changes. Spleen: Normal in size without focal abnormality. Adrenals/Urinary Tract: Adrenal glands are unremarkable. Kidneys are normal, without renal calculi, focal lesion, or hydronephrosis. Bladder is unremarkable. Stomach/Bowel: Stomach is within normal limits. Status post appendectomy. No evidence of bowel wall thickening, distention, or inflammatory changes. Vascular/Lymphatic: Mild to moderate aortic atherosclerosis. No aneurysm. No suspicious nodes Reproductive: Prostate is unremarkable. Other: Negative for free air or free  fluid. Fat containing inguinal hernias Musculoskeletal: No acute or significant osseous findings. IMPRESSION: No CT evidence for acute intra-abdominal or pelvic abnormality Electronically Signed   By: Jasmine Pang M.D.   On: 03/13/2021 22:12   CT Angio Chest PE W and/or Wo Contrast  Result Date: 03/14/2021 CLINICAL DATA:  Hypoxia EXAM: CT ANGIOGRAPHY CHEST WITH CONTRAST TECHNIQUE: Multidetector CT imaging of the chest was performed using the standard protocol during bolus administration of intravenous contrast. Multiplanar CT image reconstructions and MIPs were obtained to evaluate the vascular anatomy. CONTRAST:  36mL OMNIPAQUE IOHEXOL 350 MG/ML SOLN COMPARISON:  None. FINDINGS: Cardiovascular: There is excellent opacification of the pulmonary arterial tree. No intraluminal filling defect identified to suggest acute pulmonary embolism. Central pulmonary arteries are of normal caliber. There is mild cardiomegaly with left ventricular dilation noted. Subendocardial fatty infiltration and thinning of the left ventricular apex suggests prior myocardial infarction in this location. Coronary artery bypass grafting and coronary artery stenting has been  performed. No pericardial effusion. The thoracic aorta is unremarkable. Mediastinum/Nodes: Thyroid unremarkable. Esophagus unremarkable. Shotty right hilar adenopathy may be reactive in nature. No frankly pathologic thoracic adenopathy. Lungs/Pleura: Mild bibasilar atelectasis. No superimposed confluent pulmonary infiltrate or suspicious focal pulmonary nodule. No pneumothorax or pleural effusion. There is diffuse bronchial wall thickening noted centrally in keeping with airway inflammation. A small amount of debris is seen within the a distal trachea. Upper Abdomen: No acute abnormality. Musculoskeletal: No acute abnormality. No lytic or blastic bone lesion. Review of the MIP images confirms the above findings. IMPRESSION: No pulmonary embolism. Mild cardiomegaly with  marked left ventricular dilation. Subendocardial fatty infiltration and thinning of the a left ventricular apex suggest prior myocardial infarction in this location. Diffuse bronchial wall thickening in keeping with airway inflammation. If acute, this can be seen the setting of atypical infection or reactive airway disease. No confluent pulmonary infiltrate. No central obstructing lesion. Electronically Signed   By: Helyn Numbers MD   On: 03/14/2021 01:25   DG Chest Portable 1 View  Result Date: 03/14/2021 CLINICAL DATA:  Hypoxia EXAM: PORTABLE CHEST 1 VIEW COMPARISON:  05/03/2015 FINDINGS: Lungs are clear. No pneumothorax or pleural effusion. Coronary artery bypass grafting has been performed. Mild cardiomegaly has developed, new since prior examination. Pulmonary vascularity is normal. No acute bone abnormality. IMPRESSION: Interval development of mild cardiomegaly. Electronically Signed   By: Helyn Numbers MD   On: 03/14/2021 00:16     LOS: 0 days   Jeoffrey Massed, MD  [facility]    To contact the attending provider between 7A-7P or the covering provider during after hours 7P-7A, please log into the web site [email or web address] and access using universal [place] password for that web site. If you do not have the password, please call the hospital operator.  03/14/2021, 10:14 AM

## 2021-03-14 NOTE — ED Notes (Signed)
Pt back from CT - requests urinal

## 2021-03-15 LAB — COMPREHENSIVE METABOLIC PANEL WITH GFR
ALT: 16 U/L (ref 0–44)
AST: 17 U/L (ref 15–41)
Albumin: 3.5 g/dL (ref 3.5–5.0)
Alkaline Phosphatase: 60 U/L (ref 38–126)
Anion gap: 14 (ref 5–15)
BUN: 13 mg/dL (ref 6–20)
CO2: 19 mmol/L — ABNORMAL LOW (ref 22–32)
Calcium: 9.3 mg/dL (ref 8.9–10.3)
Chloride: 100 mmol/L (ref 98–111)
Creatinine, Ser: 1.25 mg/dL — ABNORMAL HIGH (ref 0.61–1.24)
GFR, Estimated: >60 mL/min
Glucose, Bld: 147 mg/dL — ABNORMAL HIGH (ref 70–99)
Potassium: 3.3 mmol/L — ABNORMAL LOW (ref 3.5–5.1)
Sodium: 133 mmol/L — ABNORMAL LOW (ref 135–145)
Total Bilirubin: 1.2 mg/dL (ref 0.3–1.2)
Total Protein: 7.1 g/dL (ref 6.5–8.1)

## 2021-03-15 LAB — CBC
HCT: 46.4 % (ref 39.0–52.0)
Hemoglobin: 16.1 g/dL (ref 13.0–17.0)
MCH: 31.6 pg (ref 26.0–34.0)
MCHC: 34.7 g/dL (ref 30.0–36.0)
MCV: 91 fL (ref 80.0–100.0)
Platelets: 188 10*3/uL (ref 150–400)
RBC: 5.1 MIL/uL (ref 4.22–5.81)
RDW: 12.8 % (ref 11.5–15.5)
WBC: 18 10*3/uL — ABNORMAL HIGH (ref 4.0–10.5)
nRBC: 0 % (ref 0.0–0.2)

## 2021-03-15 LAB — PROTIME-INR
INR: 1.3 — ABNORMAL HIGH (ref 0.8–1.2)
Prothrombin Time: 15.8 s — ABNORMAL HIGH (ref 11.4–15.2)

## 2021-03-15 LAB — GLUCOSE, CAPILLARY
Glucose-Capillary: 165 mg/dL — ABNORMAL HIGH (ref 70–99)
Glucose-Capillary: 152 mg/dL — ABNORMAL HIGH (ref 70–99)
Glucose-Capillary: 216 mg/dL — ABNORMAL HIGH (ref 70–99)
Glucose-Capillary: 173 mg/dL — ABNORMAL HIGH (ref 70–99)

## 2021-03-15 LAB — HEPARIN LEVEL (UNFRACTIONATED)
Heparin Unfractionated: 0.17 IU/mL — ABNORMAL LOW (ref 0.30–0.70)
Heparin Unfractionated: 0.34 IU/mL (ref 0.30–0.70)

## 2021-03-15 LAB — HEMOGLOBIN A1C
Hgb A1c MFr Bld: 9.3 % — ABNORMAL HIGH (ref 4.8–5.6)
Mean Plasma Glucose: 220.21 mg/dL

## 2021-03-15 LAB — LACTIC ACID, PLASMA: Lactic Acid, Venous: 1.1 mmol/L (ref 0.5–1.9)

## 2021-03-15 LAB — HIV ANTIBODY (ROUTINE TESTING W REFLEX): HIV Screen 4th Generation wRfx: NONREACTIVE

## 2021-03-15 MED ORDER — POTASSIUM CHLORIDE CRYS ER 20 MEQ PO TBCR
20.0000 meq | EXTENDED_RELEASE_TABLET | Freq: Once | ORAL | Status: AC
  Administered 2021-03-15: 20 meq via ORAL
  Filled 2021-03-15: qty 1

## 2021-03-15 MED ORDER — POTASSIUM CHLORIDE CRYS ER 20 MEQ PO TBCR
40.0000 meq | EXTENDED_RELEASE_TABLET | Freq: Once | ORAL | Status: AC
  Administered 2021-03-15: 40 meq via ORAL
  Filled 2021-03-15: qty 2

## 2021-03-15 MED ORDER — WARFARIN SODIUM 7.5 MG PO TABS
7.5000 mg | ORAL_TABLET | Freq: Once | ORAL | Status: AC
  Administered 2021-03-15: 7.5 mg via ORAL
  Filled 2021-03-15: qty 1

## 2021-03-15 NOTE — Progress Notes (Signed)
ANTICOAGULATION CONSULT NOTE  Pharmacy Consult:  Heparin / Coumadin Indication: h/o mural thrombosis  No Known Allergies   Vital Signs: Temp: 98.3 F (36.8 C) (05/18 0414) Temp Source: Oral (05/18 0414) BP: 124/66 (05/18 0414) Pulse Rate: 85 (05/18 0414)  Labs: Recent Labs    03/13/21 1308 03/13/21 2314 03/14/21 0421 03/14/21 0724 03/14/21 1427 03/15/21 0026 03/15/21 0256 03/15/21 0649  HGB 14.6  --   --  15.4  --   --  16.1  --   HCT 44.0  --   --  45.4  --   --  46.4  --   PLT 187  --   --  190  --   --  188  --   LABPROT  --   --  13.6  --   --   --  15.8*  --   INR  --   --  1.0  --   --   --  1.3*  --   HEPARINUNFRC  --   --   --   --  <0.10* 0.17*  --  0.34  CREATININE 1.13  --   --  0.99  --   --  1.25*  --   TROPONINIHS  --  21* 24*  --   --   --   --   --     Estimated Creatinine Clearance: 71.4 mL/min (A) (by C-G formula based on SCr of 1.25 mg/dL (H)).   Assessment: 49 YOM to continue Coumadin from PTA for history of LV thrombus (last ECHO in 2021 no longer showing evidence of apical thrombus).  Last known dose 7.5 mg MWF and 5mg  other days.  Given subthearpeutic INR on admit - MD wants to bridge with IV heparin.  Heparin level is low normal, INR increased to 1.3, no bleeding reported.  Goal of Therapy:  INR 2-3  Heparin level 0.3-0.7 units/mL Monitor platelets by anticoagulation protocol: Yes   Plan:  Increase heparin gtt slightly to 1450 units/hr Repeat Coumadin 7.5mg  PO today Daily heparin level, PT/INR and CBC  Idabelle Mcpeters D. , PharmD, BCPS, BCCCP 03/15/2021, 8:51 AM

## 2021-03-15 NOTE — Progress Notes (Signed)
  RN utilized Nurse, adult Scientist, forensic # [id number]) during assessment and medication administration for effective communication. Patient denied abdominal pain, HA, or additional questions at this time. No other distress noted. Will continue to monitor.

## 2021-03-15 NOTE — Progress Notes (Signed)
ANTICOAGULATION CONSULT NOTE   Pharmacy Consult for heparin infusion Indication: h/o mural thrombosis  Labs: Recent Labs    03/13/21 1308 03/13/21 2314 03/14/21 0421 03/14/21 0724 03/14/21 1427 03/15/21 0026  HGB 14.6  --   --  15.4  --   --   HCT 44.0  --   --  45.4  --   --   PLT 187  --   --  190  --   --   LABPROT  --   --  13.6  --   --   --   INR  --   --  1.0  --   --   --   HEPARINUNFRC  --   --   --   --  <0.10* 0.17*  CREATININE 1.13  --   --  0.99  --   --   TROPONINIHS  --  21* 24*  --   --   --    Assessment: 50 yo man to continue coumadin for hx LV thrombus (last ECHO in 2021 no longer showing evidence of apical thrombus).  Last known dose 7.5 mg MWF and 5mg  other days.    Given subthearpeutic INR - MD wants to start heparin until INR therapeutic and while ruling out abdominal pain. CBC stable, no s/sx of bleeding.   Heparin level remains subtherapeutic  Goal of Therapy:  INR 2-3 Monitor platelets by anticoagulation protocol: Yes   Plan:  Increase heparin to 1400 units/hr Obtain 6 hr HL  Daily PT/INR, HL, and CBC  Thanks for allowing pharmacy to be a part of this patient's care.  , PharmD Clinical Pharmacist 03/15/2021, 12:56 AM

## 2021-03-15 NOTE — Care Management (Signed)
PCP is  Dr Alvis Lemmings at [facility] . Will assist with medications through [facility] pharmacy.  Ronny Flurry RN

## 2021-03-15 NOTE — Progress Notes (Signed)
PROGRESS NOTE        PATIENT DETAILS Name: Isaac Ray Age: 50 y.o. Sex: male Date of Birth: November 25, 1970 Admit Date: 03/13/2021 Admitting Physician Dewayne Shorter Levora Dredge, MD GIT:JLLVDI, Odette Horns, MD  Brief Narrative: Patient is a 50 y.o. 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  Significant events: 5/16>> admit for evaluation of abdominal pain.  Significant studies: 5/16>> CT abdomen/pelvis (no contrast): No acute abdominal/pelvic abnormality. 5/17>> CT angio chest: No PE, mild cardiomegaly, diffuse bronchial wall thickening. 5/17>> Echo: EF 25-30%, global hypokinesis.  Grade 2 diastolic dysfunction.  Small apical echodensity seen in contrasted images.  Antimicrobial therapy: None  Microbiology data: 5/16>> COVID-19 PCR: Negative 5/17>> blood cultures: No growth  Procedures : None  Consults: None  DVT Prophylaxis : IV heparin/Coumadin warfarin (COUMADIN) tablet 7.5 mg   Subjective: Abdominal pain has completely resolved!.  No nausea or vomiting.  (Rounded with iPad translator at bedside)   Assessment/Plan: Abdominal pain: Unclear etiology-has COMPLETELY resolved as of 5/17.  Doubt any further work-up required given complete resolution of the pain and benign exam.  Leukocytosis: Leukocytosis continues-initial concern was for intra-abdominal infection given abdominal pain-however abdominal pain has completely resolved.  No other source of infection apparent.  Blood cultures negative so far.  Given overall stability and clinical improvement-continue to monitor off antimicrobial therapy.  If clinical source becomes apparent for if he deteriorates-we will have low threshold to start empiric antimicrobial therapy.  Repeat CBC tomorrow.    Acute hypoxic respiratory failure due to HFrEF with mild  exacerbation: On minimal amount of O2-continue Lasix-attempted titrate of oxygen   HFrEF with mild exacerbation: Remains euvolemic-continue Lasix/Entresto/Coreg.   CAD s/p CABG: No anginal symptoms-continue Plavix/statin/Coreg  History of PAD: Continue antiplatelet/statin.  History of LV thrombus: Noncompliant to Coumadin-claims he stopped taking for approximately 2 weeks when he was in [place] city.  Repeat echo continues to show what looks like a thrombus in the LV apex.  Continue IV heparin and overlapping Coumadin.   DM-2: Follow CBGs-continue SSI-and add long-acting insulin accordingly.  Recent Labs    03/14/21 2103 03/15/21 0559 03/15/21 1128  GLUCAP 154* 165* 152*   Diet: Diet Order            Diet heart healthy/carb modified Room service appropriate? Yes; Fluid consistency: Thin  Diet effective now                  Code Status: Full code   Family Communication: None at bedside.  Disposition Plan: Status is: Inpatient.  The patient will require care spanning > 2 midnights and should be moved to inpatient because: Inpatient level of care appropriate due to severity of illness  Dispo: The patient is from: Home              Anticipated d/c is to: Home              Patient currently is not medically stable to d/c.   Difficult to place patient No   Barriers to Discharge: Unexplained abdominal pain-worsening leukocytosis-LV thrombus on IV heparin/Coumadin.  Antimicrobial agents: Anti-infectives (From admission, onward)   None       Time spent: 35- minutes-Greater than 50% of this time was spent in counseling, explanation of diagnosis, planning  of further management, and coordination of care.  MEDICATIONS: Scheduled Meds: . atorvastatin  80 mg Oral Daily  . carvedilol  3.125 mg Oral BID WC  . clopidogrel  75 mg Oral Q breakfast  . empagliflozin  10 mg Oral QAC breakfast  . furosemide  40 mg Oral Daily  . insulin aspart  0-9 Units Subcutaneous TID WC  .  pantoprazole  40 mg Oral BID  . sacubitril-valsartan  1 tablet Oral BID  . sodium chloride flush  3 mL Intravenous Q12H  . warfarin  7.5 mg Oral ONCE-1600  . Warfarin - Pharmacist Dosing Inpatient   Does not apply q1600   Continuous Infusions: . sodium chloride    . heparin 1,450 Units/hr (03/15/21 0948)   PRN Meds:.sodium chloride, acetaminophen, HYDROcodone-acetaminophen, morphine injection, ondansetron (ZOFRAN) IV, sodium chloride flush   PHYSICAL EXAM: Vital signs: Vitals:   03/14/21 2003 03/14/21 2342 03/15/21 0414 03/15/21 1208  BP: 139/72 124/73 124/66 102/70  Pulse: 62 86 85 84  Resp: 20 18 15 18   Temp: 99.3 F (37.4 C) 99.5 F (37.5 C) 98.3 F (36.8 C) 99.7 F (37.6 C)  TempSrc: Oral Oral Oral Oral  SpO2: 97% 98% 95% 94%  Weight:   72.2 kg   Height:       Filed Weights   03/14/21 0845 03/14/21 1341 03/15/21 0414  Weight: 75.1 kg 71.3 kg 72.2 kg   Body mass index is 15.86 kg/m.   Gen Exam:Alert awake-not in any distress HEENT:atraumatic, normocephalic Chest: B/L clear to auscultation anteriorly CVS:S1S2 regular Abdomen:soft non tender, non distended Extremities:no edema Neurology: Non focal Skin: no rash  I have personally reviewed following labs and imaging studies  LABORATORY DATA: CBC: Recent Labs  Lab 03/13/21 1308 03/14/21 0724 03/15/21 0256  WBC 12.8* 15.3* 18.0*  NEUTROABS 9.0*  --   --   HGB 14.6 15.4 16.1  HCT 44.0 45.4 46.4  MCV 94.4 91.5 91.0  PLT 187 190 188    Basic Metabolic Panel: Recent Labs  Lab 03/13/21 1308 03/14/21 0724 03/15/21 0256  NA 137 133* 133*  K 4.2 3.8 3.3*  CL 107 101 100  CO2 22 24 19*  GLUCOSE 212* 281* 147*  BUN 14 11 13   CREATININE 1.13 0.99 1.25*  CALCIUM 9.1 9.0 9.3    GFR: Estimated Creatinine Clearance: 71.4 mL/min (A) (by C-G formula based on SCr of 1.25 mg/dL (H)).  Liver Function Tests: Recent Labs  Lab 03/13/21 1308 03/14/21 0724 03/15/21 0256  AST 15 19 17   ALT 19 20 16    ALKPHOS 58 64 60  BILITOT 0.6 0.7 1.2  PROT 7.0 7.1 7.1  ALBUMIN 3.7 3.8 3.5   Recent Labs  Lab 03/13/21 1308  LIPASE 33   No results for input(s): AMMONIA in the last 168 hours.  Coagulation Profile: Recent Labs  Lab 03/14/21 0421 03/15/21 0256  INR 1.0 1.3*    Cardiac Enzymes: No results for input(s): CKTOTAL, CKMB, CKMBINDEX, TROPONINI in the last 168 hours.  BNP (last 3 results) No results for input(s): PROBNP in the last 8760 hours.  Lipid Profile: No results for input(s): CHOL, HDL, LDLCALC, TRIG, CHOLHDL, LDLDIRECT in the last 72 hours.  Thyroid Function Tests: No results for input(s): TSH, T4TOTAL, FREET4, T3FREE, THYROIDAB in the last 72 hours.  Anemia Panel: No results for input(s): VITAMINB12, FOLATE, FERRITIN, TIBC, IRON, RETICCTPCT in the last 72 hours.  Urine analysis:    Component Value Date/Time   COLORURINE YELLOW 02/15/2015 1356  APPEARANCEUR CLEAR 02/15/2015 1356   LABSPEC 1.020 02/15/2015 1356   PHURINE 6.0 02/15/2015 1356   GLUCOSEU 250 (A) 02/15/2015 1356   HGBUR NEGATIVE 02/15/2015 1356   BILIRUBINUR negative 04/22/2019 1242   KETONESUR negative 04/22/2019 1242   KETONESUR NEGATIVE 02/15/2015 1356   PROTEINUR NEGATIVE 02/15/2015 1356   UROBILINOGEN 0.2 04/22/2019 1242   UROBILINOGEN 2.0 (H) 02/15/2015 1356   NITRITE Negative 04/22/2019 1242   NITRITE NEGATIVE 02/15/2015 1356   LEUKOCYTESUR Negative 04/22/2019 1242    Sepsis Labs: Lactic Acid, Venous    Component Value Date/Time   LATICACIDVEN 1.1 03/15/2021 0256    MICROBIOLOGY: Recent Results (from the past 240 hour(s))  SARS CORONAVIRUS 2 (TAT 6-24 HRS) Nasopharyngeal Nasopharyngeal Swab     Status: None   Collection Time: 03/13/21 10:36 PM   Specimen: Nasopharyngeal Swab  Result Value Ref Range Status   SARS Coronavirus 2 NEGATIVE NEGATIVE Final    Comment: (NOTE) SARS-CoV-2 target nucleic acids are NOT DETECTED.  The SARS-CoV-2 RNA is generally detectable in upper  and lower respiratory specimens during the acute phase of infection. Negative results do not preclude SARS-CoV-2 infection, do not rule out co-infections with other pathogens, and should not be used as the sole basis for treatment or other patient management decisions. Negative results must be combined with clinical observations, patient history, and epidemiological information. The expected result is Negative.  Fact Sheet for Patients: HairSlick.no  Fact Sheet for Healthcare Providers: quierodirigir.com  This test is not yet approved or cleared by the [place] FDA and  has been authorized for detection and/or diagnosis of SARS-CoV-2 by FDA under an Emergency Use Authorization (EUA). This EUA will remain  in effect (meaning this test can be used) for the duration of the COVID-19 declaration under Se ction 564(b)(1) of the Act, 21 U.S.C. section 360bbb-3(b)(1), unless the authorization is terminated or revoked sooner.  Performed at [facility], [address], [place], [place] [address]   Culture, blood (routine x 2)     Status: None (Preliminary result)   Collection Time: 03/14/21 11:26 AM   Specimen: BLOOD  Result Value Ref Range Status   Specimen Description BLOOD LEFT ANTECUBITAL  Final   Special Requests   Final    BOTTLES DRAWN AEROBIC AND ANAEROBIC Blood Culture adequate volume   Culture   Final    NO GROWTH < 24 HOURS Performed at [facility], [address] [place], [place] [address]    Report Status PENDING  Incomplete  Culture, blood (routine x 2)     Status: None (Preliminary result)   Collection Time: 03/14/21 11:29 AM   Specimen: BLOOD  Result Value Ref Range Status   Specimen Description BLOOD RIGHT ANTECUBITAL  Final   Special Requests   Final    BOTTLES DRAWN AEROBIC AND ANAEROBIC Blood Culture adequate volume   Culture   Final    NO GROWTH < 24 HOURS Performed at [facility], [address], [place], [place] [address]    Report Status PENDING  Incomplete    RADIOLOGY STUDIES/RESULTS: CT ABDOMEN PELVIS WO CONTRAST  Result Date: 03/13/2021 CLINICAL DATA:  Mid abdominal pain EXAM: CT ABDOMEN AND PELVIS WITHOUT CONTRAST TECHNIQUE: Multidetector CT imaging of the abdomen and pelvis was performed following the standard protocol without IV contrast. COMPARISON:  CT 04/25/2019 FINDINGS: Lower chest: Lung bases demonstrate no acute consolidation or pleural effusion. Coronary stent. Hepatobiliary: No focal liver abnormality is seen. No gallstones, gallbladder  wall thickening, or biliary dilatation. Pancreas: Unremarkable. No pancreatic ductal dilatation or surrounding inflammatory changes. Spleen: Normal in size without focal abnormality. Adrenals/Urinary Tract: Adrenal glands are unremarkable. Kidneys are normal, without renal calculi, focal lesion, or hydronephrosis. Bladder is unremarkable. Stomach/Bowel: Stomach is within normal limits. Status post appendectomy. No evidence of bowel wall thickening, distention, or inflammatory changes. Vascular/Lymphatic: Mild to moderate aortic atherosclerosis. No aneurysm. No suspicious nodes Reproductive: Prostate is unremarkable. Other: Negative for free air or free fluid. Fat containing inguinal hernias Musculoskeletal: No acute or significant osseous findings. IMPRESSION: No CT evidence for acute intra-abdominal or pelvic abnormality Electronically Signed   By: Jasmine Pang M.D.   On: 03/13/2021 22:12   CT Angio Chest PE W and/or Wo Contrast  Result Date: 03/14/2021 CLINICAL DATA:  Hypoxia EXAM: CT ANGIOGRAPHY CHEST WITH CONTRAST TECHNIQUE: Multidetector CT imaging of the chest was performed using the standard protocol during bolus administration of intravenous contrast. Multiplanar CT image reconstructions and MIPs were obtained to evaluate the vascular anatomy. CONTRAST:  27mL OMNIPAQUE IOHEXOL 350 MG/ML SOLN COMPARISON:  None.  FINDINGS: Cardiovascular: There is excellent opacification of the pulmonary arterial tree. No intraluminal filling defect identified to suggest acute pulmonary embolism. Central pulmonary arteries are of normal caliber. There is mild cardiomegaly with left ventricular dilation noted. Subendocardial fatty infiltration and thinning of the left ventricular apex suggests prior myocardial infarction in this location. Coronary artery bypass grafting and coronary artery stenting has been performed. No pericardial effusion. The thoracic aorta is unremarkable. Mediastinum/Nodes: Thyroid unremarkable. Esophagus unremarkable. Shotty right hilar adenopathy may be reactive in nature. No frankly pathologic thoracic adenopathy. Lungs/Pleura: Mild bibasilar atelectasis. No superimposed confluent pulmonary infiltrate or suspicious focal pulmonary nodule. No pneumothorax or pleural effusion. There is diffuse bronchial wall thickening noted centrally in keeping with airway inflammation. A small amount of debris is seen within the a distal trachea. Upper Abdomen: No acute abnormality. Musculoskeletal: No acute abnormality. No lytic or blastic bone lesion. Review of the MIP images confirms the above findings. IMPRESSION: No pulmonary embolism. Mild cardiomegaly with marked left ventricular dilation. Subendocardial fatty infiltration and thinning of the a left ventricular apex suggest prior myocardial infarction in this location. Diffuse bronchial wall thickening in keeping with airway inflammation. If acute, this can be seen the setting of atypical infection or reactive airway disease. No confluent pulmonary infiltrate. No central obstructing lesion. Electronically Signed   By: Helyn Numbers MD   On: 03/14/2021 01:25   DG Chest Portable 1 View  Result Date: 03/14/2021 CLINICAL DATA:  Hypoxia EXAM: PORTABLE CHEST 1 VIEW COMPARISON:  05/03/2015 FINDINGS: Lungs are clear. No pneumothorax or pleural effusion. Coronary artery bypass  grafting has been performed. Mild cardiomegaly has developed, new since prior examination. Pulmonary vascularity is normal. No acute bone abnormality. IMPRESSION: Interval development of mild cardiomegaly. Electronically Signed   By: Helyn Numbers MD   On: 03/14/2021 00:16   ECHOCARDIOGRAM COMPLETE  Result Date: 03/14/2021    ECHOCARDIOGRAM REPORT   Patient Name:   BARTT GONZAGA Date of Exam: 03/14/2021 Medical Rec #:  [id number]               Height:       67.0 in Accession #:    [id number]              Weight:       174.8 lb Date of Birth:  1971/10/21              BSA:  1.910 m Patient Age:    49 years                BP:           154/79 mmHg Patient Gender: M                       HR:           50 bpm. Exam Location:  Inpatient Procedure: 2D Echo, Cardiac Doppler and Color Doppler Indications:    CHF  History:        Patient has prior history of Echocardiogram examinations, most                 recent 11/03/2019. CAD, Prior CABG; Risk Factors:Former Smoker,                 Diabetes and Dyslipidemia.  Sonographer:    Shirlean KellyJohn Mendel Brown Referring Phys: [id number] TIMOTHY S OPYD IMPRESSIONS  1. Left ventricular ejection fraction, by estimation, is 25 to 30%. The left ventricle has severely decreased function. The left ventricle demonstrates global hypokinesis. The left ventricular internal cavity size was severely dilated. Left ventricular diastolic parameters are consistent with Grade II diastolic dysfunction (pseudonormalization). There is a small apical echodensity seen in the contrasted images.  2. Right ventricular systolic function is normal. The right ventricular size is normal.  3. Left atrial size was mildly dilated.  4. The mitral valve is grossly normal. Mild mitral valve regurgitation. No evidence of mitral stenosis.  5. The aortic valve is tricuspid. Aortic valve regurgitation is not visualized. No aortic stenosis is present.  6. The inferior vena cava is normal in size with greater than  50% respiratory variability, suggesting right atrial pressure of 3 mmHg. Comparison(s): A prior study was performed on 11/03/2019. Prior images reviewed side by side. Compared to 04/25/19 study, echo density is much improved and similar to 11/03/19 study. FINDINGS  Left Ventricle: Left ventricular ejection fraction, by estimation, is 25 to 30%. The left ventricle has severely decreased function. The left ventricle demonstrates global hypokinesis. The left ventricular internal cavity size was severely dilated. There is no left ventricular hypertrophy. Left ventricular diastolic parameters are consistent with Grade II diastolic dysfunction (pseudonormalization). Right Ventricle: The right ventricular size is normal. No increase in right ventricular wall thickness. Right ventricular systolic function is normal. Left Atrium: Left atrial size was mildly dilated. Right Atrium: Right atrial size was normal in size. Pericardium: There is no evidence of pericardial effusion. Presence of pericardial fat pad. Mitral Valve: The mitral valve is grossly normal. Mild mitral valve regurgitation. No evidence of mitral valve stenosis. Tricuspid Valve: The tricuspid valve is normal in structure. Tricuspid valve regurgitation is not demonstrated. Aortic Valve: The aortic valve is tricuspid. Aortic valve regurgitation is not visualized. No aortic stenosis is present. Aortic valve mean gradient measures 8.0 mmHg. Aortic valve peak gradient measures 18.3 mmHg. Aortic valve area, by VTI measures 1.44  cm. Pulmonic Valve: The pulmonic valve was normal in structure. Pulmonic valve regurgitation is not visualized. No evidence of pulmonic stenosis. Aorta: The aortic root and ascending aorta are structurally normal, with no evidence of dilitation. Venous: The inferior vena cava is normal in size with greater than 50% respiratory variability, suggesting right atrial pressure of 3 mmHg. IAS/Shunts: The atrial septum is grossly normal.  LEFT VENTRICLE  PLAX 2D LVIDd:         7.10 cm LVIDs:  6.00 cm LV PW:         1.10 cm LV IVS:        1.00 cm LVOT diam:     2.00 cm LV SV:         55 LV SV Index:   29 LVOT Area:     3.14 cm  RIGHT VENTRICLE             IVC RV S prime:     10.00 cm/s  IVC diam: 1.80 cm TAPSE (M-mode): 1.9 cm LEFT ATRIUM             Index       RIGHT ATRIUM           Index LA diam:        4.40 cm 2.30 cm/m  RA Area:     14.50 cm LA Vol (A2C):   55.7 ml 29.17 ml/m RA Volume:   34.10 ml  17.86 ml/m LA Vol (A4C):   68.3 ml 35.76 ml/m LA Biplane Vol: 65.0 ml 34.04 ml/m  AORTIC VALVE AV Area (Vmax):    1.57 cm AV Area (Vmean):   1.54 cm AV Area (VTI):     1.44 cm AV Vmax:           214.00 cm/s AV Vmean:          123.000 cm/s AV VTI:            0.382 m AV Peak Grad:      18.3 mmHg AV Mean Grad:      8.0 mmHg LVOT Vmax:         107.00 cm/s LVOT Vmean:        60.100 cm/s LVOT VTI:          0.175 m LVOT/AV VTI ratio: 0.46  AORTA Ao Root diam: 3.00 cm Ao Asc diam:  2.90 cm MITRAL VALVE MV Area (PHT): 3.65 cm     SHUNTS MV Decel Time: 208 msec     Systemic VTI:  0.18 m MV E velocity: 120.00 cm/s  Systemic Diam: 2.00 cm MV A velocity: 94.00 cm/s MV E/A ratio:  1.28 Riley Lam MD Electronically signed by Riley Lam MD Signature Date/Time: 03/14/2021/11:47:29 AM    Final      LOS: 1 day   Jeoffrey Massed, MD  [facility]    To contact the attending provider between 7A-7P or the covering provider during after hours 7P-7A, please log into the web site [email or web address] and access using universal [place] password for that web site. If you do not have the password, please call the hospital operator.  03/15/2021, 1:55 PM

## 2021-03-15 NOTE — Progress Notes (Signed)
Tele called patient has ST elevation > 2 MCL lead. Patient has no symptoms of pain and discomfort. MD Ghimire made aware.

## 2021-03-15 NOTE — Progress Notes (Signed)
Patient has temperature 101.2 oral. Given tylenol 650 mg recheck temp was 99.2. Will continue monitor.

## 2021-03-16 DIAGNOSIS — I739 Peripheral vascular disease, unspecified: Secondary | ICD-10-CM

## 2021-03-16 DIAGNOSIS — E119 Type 2 diabetes mellitus without complications: Secondary | ICD-10-CM

## 2021-03-16 LAB — CBC WITH DIFFERENTIAL/PLATELET
Abs Immature Granulocytes: 0.1 10*3/uL — ABNORMAL HIGH (ref 0.00–0.07)
Basophils Absolute: 0.1 10*3/uL (ref 0.0–0.1)
Basophils Relative: 0 %
Eosinophils Absolute: 0.1 10*3/uL (ref 0.0–0.5)
Eosinophils Relative: 0 %
HCT: 45.2 % (ref 39.0–52.0)
Hemoglobin: 15.6 g/dL (ref 13.0–17.0)
Immature Granulocytes: 1 %
Lymphocytes Relative: 16 %
Lymphs Abs: 3 10*3/uL (ref 0.7–4.0)
MCH: 31.4 pg (ref 26.0–34.0)
MCHC: 34.5 g/dL (ref 30.0–36.0)
MCV: 90.9 fL (ref 80.0–100.0)
Monocytes Absolute: 1.6 10*3/uL — ABNORMAL HIGH (ref 0.1–1.0)
Monocytes Relative: 9 %
Neutro Abs: 13.8 10*3/uL — ABNORMAL HIGH (ref 1.7–7.7)
Neutrophils Relative %: 74 %
Platelets: 186 10*3/uL (ref 150–400)
RBC: 4.97 MIL/uL (ref 4.22–5.81)
RDW: 13 % (ref 11.5–15.5)
WBC: 18.6 10*3/uL — ABNORMAL HIGH (ref 4.0–10.5)
nRBC: 0 % (ref 0.0–0.2)

## 2021-03-16 LAB — BASIC METABOLIC PANEL
Anion gap: 13 (ref 5–15)
BUN: 20 mg/dL (ref 6–20)
CO2: 18 mmol/L — ABNORMAL LOW (ref 22–32)
Calcium: 8.8 mg/dL — ABNORMAL LOW (ref 8.9–10.3)
Chloride: 100 mmol/L (ref 98–111)
Creatinine, Ser: 1.32 mg/dL — ABNORMAL HIGH (ref 0.61–1.24)
GFR, Estimated: >60 mL/min (ref >60)
Glucose, Bld: 148 mg/dL — ABNORMAL HIGH (ref 70–99)
Potassium: 3.7 mmol/L (ref 3.5–5.1)
Sodium: 131 mmol/L — ABNORMAL LOW (ref 135–145)

## 2021-03-16 LAB — GLUCOSE, CAPILLARY
Glucose-Capillary: 151 mg/dL — ABNORMAL HIGH (ref 70–99)
Glucose-Capillary: 124 mg/dL — ABNORMAL HIGH (ref 70–99)
Glucose-Capillary: 147 mg/dL — ABNORMAL HIGH (ref 70–99)
Glucose-Capillary: 215 mg/dL — ABNORMAL HIGH (ref 70–99)

## 2021-03-16 LAB — PROTIME-INR
INR: 1.6 — ABNORMAL HIGH (ref 0.8–1.2)
Prothrombin Time: 19.2 seconds — ABNORMAL HIGH (ref 11.4–15.2)

## 2021-03-16 LAB — HEPATIC FUNCTION PANEL
ALT: 14 U/L (ref 0–44)
AST: 14 U/L — ABNORMAL LOW (ref 15–41)
Albumin: 3.1 g/dL — ABNORMAL LOW (ref 3.5–5.0)
Alkaline Phosphatase: 59 U/L (ref 38–126)
Bilirubin, Direct: 0.2 mg/dL (ref 0.0–0.2)
Indirect Bilirubin: 0.7 mg/dL (ref 0.3–0.9)
Total Bilirubin: 0.9 mg/dL (ref 0.3–1.2)
Total Protein: 7 g/dL (ref 6.5–8.1)

## 2021-03-16 LAB — CBC
HCT: 44.3 % (ref 39.0–52.0)
Hemoglobin: 15.6 g/dL (ref 13.0–17.0)
MCH: 31.5 pg (ref 26.0–34.0)
MCHC: 35.2 g/dL (ref 30.0–36.0)
MCV: 89.5 fL (ref 80.0–100.0)
Platelets: 206 10*3/uL (ref 150–400)
RBC: 4.95 MIL/uL (ref 4.22–5.81)
RDW: 12.9 % (ref 11.5–15.5)
WBC: 18.1 10*3/uL — ABNORMAL HIGH (ref 4.0–10.5)
nRBC: 0 % (ref 0.0–0.2)

## 2021-03-16 LAB — HEPATITIS A ANTIBODY, IGM: Hep A IgM: NONREACTIVE

## 2021-03-16 LAB — RESP PANEL BY RT-PCR (FLU A&B, COVID) ARPGX2
Influenza A by PCR: NEGATIVE
Influenza B by PCR: NEGATIVE
SARS Coronavirus 2 by RT PCR: NEGATIVE

## 2021-03-16 LAB — HEPARIN LEVEL (UNFRACTIONATED): Heparin Unfractionated: 0.55 IU/mL (ref 0.30–0.70)

## 2021-03-16 MED ORDER — WARFARIN SODIUM 7.5 MG PO TABS
7.5000 mg | ORAL_TABLET | Freq: Once | ORAL | Status: AC
  Administered 2021-03-16: 7.5 mg via ORAL
  Filled 2021-03-16: qty 1

## 2021-03-16 NOTE — Progress Notes (Signed)
PROGRESS NOTE        PATIENT DETAILS Name: Isaac Ray Age: 50 y.o. Sex: male Date of Birth: 1971-10-08 Admit Date: 03/13/2021 Admitting Physician Dewayne Shorter Levora Dredge, MD MBE:[id number], Odette Horns, MD  Brief Narrative: Patient is a 50 y.o. 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  He recently spent 1 month in [place]-and returned back to the [place] on 5/15.  Significant events: 5/16>> admit for evaluation of abdominal pain. 5/18>> febrile to 101.2  Significant studies: 5/16>> CT abdomen/pelvis (no contrast): No acute abdominal/pelvic abnormality. 5/17>> CT angio chest: No PE, mild cardiomegaly, diffuse bronchial wall thickening. 5/17>> Echo: EF 25-30%, global hypokinesis.  Grade 2 diastolic dysfunction.  Small apical echodensity seen in contrasted images.  Antimicrobial therapy: None  Microbiology data: 5/16>> COVID-19 PCR: Negative 5/17>> blood cultures: No growth  Procedures : None  Consults: None  DVT Prophylaxis : IV heparin/Coumadin warfarin (COUMADIN) tablet 7.5 mg   Subjective: No abdominal pain-acknowledges feeling feverish overnight.  No nausea/vomiting/diarrhea/dysuria.  No cough.  No headache.  No rash.  Had fever yesterday evening-felt hot/fevers last night.  (Rounded with iPad translator at bedside)   Assessment/Plan: Abdominal pain: Unclear etiology-has COMPLETELY resolved as of 5/17.  Doubt any further work-up required given complete resolution of the pain and benign exam.  Fever with leukocytosis: Leukocytosis continues-started having fever on 5/18.  He did have abdominal pain when he first presented-but that has completely resolved in the past 2 days.  He has no other foci of infection apparent at this point.  He did spend approximately 1 month in [place] and just came back to the [place] on  5/15.  Continue to hold off on antimicrobial therapy-will consult infectious disease for second opinion/evaluation.  AKI: Mild-hold Lasix-volume status stable-follow.  Acute hypoxic respiratory failure due to HFrEF with mild exacerbation: Hypoxia resolved-on room air.  Holding Lasix due to AKI.  HFrEF with mild exacerbation: Remains euvolemic-holding Lasix-continue Entresto/Coreg.   CAD s/p CABG: No anginal symptoms-continue Plavix/statin/Coreg  History of PAD: Continue antiplatelet/statin.  History of LV thrombus: Noncompliant to Coumadin-claims he stopped taking for approximately 2 weeks when he was in [place] city.  Repeat echo continues to show what looks like a thrombus in the LV apex.  Continue IV heparin and overlapping Coumadin.   DM-2: Follow CBGs-continue SSI-and add long-acting insulin accordingly.  Recent Labs    03/15/21 2105 03/16/21 0606 03/16/21 1108  GLUCAP 173* 151* 124*   Diet: Diet Order            Diet heart healthy/carb modified Room service appropriate? Yes; Fluid consistency: Thin  Diet effective now                  Code Status: Full code   Family Communication: None at bedside.  Disposition Plan: Status is: Inpatient.  The patient will require care spanning > 2 midnights and should be moved to inpatient because: Inpatient level of care appropriate due to severity of illness  Dispo: The patient is from: Home              Anticipated d/c is to: Home              Patient currently is not medically stable to d/c.   Difficult to place  patient No   Barriers to Discharge: Unexplained abdominal pain-worsening leukocytosis-LV thrombus on IV heparin/Coumadin.  Antimicrobial agents: Anti-infectives (From admission, onward)   None       Time spent: 35- minutes-Greater than 50% of this time was spent in counseling, explanation of diagnosis, planning of further management, and coordination of care.  MEDICATIONS: Scheduled Meds: . atorvastatin   80 mg Oral Daily  . carvedilol  3.125 mg Oral BID WC  . clopidogrel  75 mg Oral Q breakfast  . insulin aspart  0-9 Units Subcutaneous TID WC  . pantoprazole  40 mg Oral BID  . sacubitril-valsartan  1 tablet Oral BID  . sodium chloride flush  3 mL Intravenous Q12H  . warfarin  7.5 mg Oral ONCE-1600  . Warfarin - Pharmacist Dosing Inpatient   Does not apply q1600   Continuous Infusions: . sodium chloride    . heparin 1,450 Units/hr (03/15/21 2309)   PRN Meds:.sodium chloride, acetaminophen, HYDROcodone-acetaminophen, morphine injection, ondansetron (ZOFRAN) IV, sodium chloride flush   PHYSICAL EXAM: Vital signs: Vitals:   03/15/21 2255 03/16/21 0423 03/16/21 0806 03/16/21 1154  BP: 106/66 115/69 99/65 98/64   Pulse: 88 80 72 71  Resp: 19 17  18   Temp: 100 F (37.8 C) 98.9 F (37.2 C)  97.6 F (36.4 C)  TempSrc: Oral Oral  Oral  SpO2: 94% 95%  96%  Weight:  72.2 kg    Height:       Filed Weights   03/14/21 1341 03/15/21 0414 03/16/21 0423  Weight: 71.3 kg 72.2 kg 72.2 kg   Body mass index is 15.86 kg/m.   Gen Exam:Alert awake-not in any distress HEENT:atraumatic, normocephalic Chest: B/L clear to auscultation anteriorly CVS:S1S2 regular Abdomen:soft non tender, non distended Extremities:no edema Neurology: Non focal Skin: no rash  I have personally reviewed following labs and imaging studies  LABORATORY DATA: CBC: Recent Labs  Lab 03/13/21 1308 03/14/21 0724 03/15/21 0256 03/16/21 0155  WBC 12.8* 15.3* 18.0* 18.1*  NEUTROABS 9.0*  --   --   --   HGB 14.6 15.4 16.1 15.6  HCT 44.0 45.4 46.4 44.3  MCV 94.4 91.5 91.0 89.5  PLT 187 190 188 206    Basic Metabolic Panel: Recent Labs  Lab 03/13/21 1308 03/14/21 0724 03/15/21 0256 03/16/21 0155  NA 137 133* 133* 131*  K 4.2 3.8 3.3* 3.7  CL 107 101 100 100  CO2 22 24 19* 18*  GLUCOSE 212* 281* 147* 148*  BUN 14 11 13 20   CREATININE 1.13 0.99 1.25* 1.32*  CALCIUM 9.1 9.0 9.3 8.8*     GFR: Estimated Creatinine Clearance: 67.6 mL/min (A) (by C-G formula based on SCr of 1.32 mg/dL (H)).  Liver Function Tests: Recent Labs  Lab 03/13/21 1308 03/14/21 0724 03/15/21 0256  AST 15 19 17   ALT 19 20 16   ALKPHOS 58 64 60  BILITOT 0.6 0.7 1.2  PROT 7.0 7.1 7.1  ALBUMIN 3.7 3.8 3.5   Recent Labs  Lab 03/13/21 1308  LIPASE 33   No results for input(s): AMMONIA in the last 168 hours.  Coagulation Profile: Recent Labs  Lab 03/14/21 0421 03/15/21 0256 03/16/21 0155  INR 1.0 1.3* 1.6*    Cardiac Enzymes: No results for input(s): CKTOTAL, CKMB, CKMBINDEX, TROPONINI in the last 168 hours.  BNP (last 3 results) No results for input(s): PROBNP in the last 8760 hours.  Lipid Profile: No results for input(s): CHOL, HDL, LDLCALC, TRIG, CHOLHDL, LDLDIRECT in the last 72 hours.  Thyroid  Function Tests: No results for input(s): TSH, T4TOTAL, FREET4, T3FREE, THYROIDAB in the last 72 hours.  Anemia Panel: No results for input(s): VITAMINB12, FOLATE, FERRITIN, TIBC, IRON, RETICCTPCT in the last 72 hours.  Urine analysis:    Component Value Date/Time   COLORURINE YELLOW 02/15/2015 1356   APPEARANCEUR CLEAR 02/15/2015 1356   LABSPEC 1.020 02/15/2015 1356   PHURINE 6.0 02/15/2015 1356   GLUCOSEU 250 (A) 02/15/2015 1356   HGBUR NEGATIVE 02/15/2015 1356   BILIRUBINUR negative 04/22/2019 1242   KETONESUR negative 04/22/2019 1242   KETONESUR NEGATIVE 02/15/2015 1356   PROTEINUR NEGATIVE 02/15/2015 1356   UROBILINOGEN 0.2 04/22/2019 1242   UROBILINOGEN 2.0 (H) 02/15/2015 1356   NITRITE Negative 04/22/2019 1242   NITRITE NEGATIVE 02/15/2015 1356   LEUKOCYTESUR Negative 04/22/2019 1242    Sepsis Labs: Lactic Acid, Venous    Component Value Date/Time   LATICACIDVEN 1.1 03/15/2021 0256    MICROBIOLOGY: Recent Results (from the past 240 hour(s))  SARS CORONAVIRUS 2 (TAT 6-24 HRS) Nasopharyngeal Nasopharyngeal Swab     Status: None   Collection Time: 03/13/21  10:36 PM   Specimen: Nasopharyngeal Swab  Result Value Ref Range Status   SARS Coronavirus 2 NEGATIVE NEGATIVE Final    Comment: (NOTE) SARS-CoV-2 target nucleic acids are NOT DETECTED.  The SARS-CoV-2 RNA is generally detectable in upper and lower respiratory specimens during the acute phase of infection. Negative results do not preclude SARS-CoV-2 infection, do not rule out co-infections with other pathogens, and should not be used as the sole basis for treatment or other patient management decisions. Negative results must be combined with clinical observations, patient history, and epidemiological information. The expected result is Negative.  Fact Sheet for Patients: HairSlick.no  Fact Sheet for Healthcare Providers: quierodirigir.com  This test is not yet approved or cleared by the [place] FDA and  has been authorized for detection and/or diagnosis of SARS-CoV-2 by FDA under an Emergency Use Authorization (EUA). This EUA will remain  in effect (meaning this test can be used) for the duration of the COVID-19 declaration under Se ction 564(b)(1) of the Act, 21 U.S.C. section 360bbb-3(b)(1), unless the authorization is terminated or revoked sooner.  Performed at [facility], [address] [place], [place] [address]   Culture, blood (routine x 2)     Status: None (Preliminary result)   Collection Time: 03/14/21 11:26 AM   Specimen: BLOOD  Result Value Ref Range Status   Specimen Description BLOOD LEFT ANTECUBITAL  Final   Special Requests   Final    BOTTLES DRAWN AEROBIC AND ANAEROBIC Blood Culture adequate volume   Culture   Final    NO GROWTH 2 DAYS Performed at [facility], [address] [place], [place] [address]    Report Status PENDING  Incomplete  Culture, blood (routine x 2)     Status: None (Preliminary result)   Collection Time: 03/14/21 11:29 AM   Specimen: BLOOD  Result Value Ref  Range Status   Specimen Description BLOOD RIGHT ANTECUBITAL  Final   Special Requests   Final    BOTTLES DRAWN AEROBIC AND ANAEROBIC Blood Culture adequate volume   Culture   Final    NO GROWTH 2 DAYS Performed at [facility], [address] [place], [place] [address]    Report Status PENDING  Incomplete    RADIOLOGY STUDIES/RESULTS: No results found.   LOS: 2 days   Jeoffrey Massed, MD  [facility]  To contact the attending provider between 7A-7P or the covering provider during after hours 7P-7A, please log into the web site [email or web address] and access using universal [place] password for that web site. If you do not have the password, please call the hospital operator.  03/16/2021, 12:06 PM

## 2021-03-16 NOTE — Progress Notes (Signed)
ANTICOAGULATION CONSULT NOTE  Pharmacy Consult:  Heparin / Coumadin Indication: h/o mural thrombosis  No Known Allergies   Vital Signs: Temp: 98.9 F (37.2 C) (05/19 0423) Temp Source: Oral (05/19 0423) BP: 99/65 (05/19 0806) Pulse Rate: 72 (05/19 0806)  Labs: Recent Labs    03/13/21 2314 03/14/21 0421 03/14/21 0724 03/14/21 1427 03/15/21 0026 03/15/21 0256 03/15/21 0649 03/16/21 0155  HGB  --   --  15.4  --   --  16.1  --  15.6  HCT  --   --  45.4  --   --  46.4  --  44.3  PLT  --   --  190  --   --  188  --  206  LABPROT  --  13.6  --   --   --  15.8*  --  19.2*  INR  --  1.0  --   --   --  1.3*  --  1.6*  HEPARINUNFRC  --   --   --    < > 0.17*  --  0.34 0.55  CREATININE  --   --  0.99  --   --  1.25*  --  1.32*  TROPONINIHS 21* 24*  --   --   --   --   --   --    < > = values in this interval not displayed.    Estimated Creatinine Clearance: 67.6 mL/min (A) (by C-G formula based on SCr of 1.32 mg/dL (H)).   Assessment: 49 YOM to continue Coumadin from PTA for history of LV thrombus (last ECHO in 2021 no longer showing evidence of apical thrombus).  Last known dose 7.5 mg MWF and 5mg  other days.  Given subthearpeutic INR on admit - MD wants to bridge with IV heparin.  Heparin level therapeutic and INR is trending up towards goal; no bleeding reported.  Goal of Therapy:  INR 2-3  Heparin level 0.3-0.7 units/mL Monitor platelets by anticoagulation protocol: Yes   Plan:  Continue heparin gtt at 1450 units/hr Repeat Coumadin 7.5mg  PO today Daily heparin level, PT/INR and CBC  Brady Plant D. , PharmD, BCPS, BCCCP 03/16/2021, 9:03 AM

## 2021-03-16 NOTE — Progress Notes (Signed)
   03/16/21 0629  Mechanical VTE Prophylaxis (All Areas)  Mechanical VTE Prophylaxis Other (Comment) (hearin gtts)  Pressure Injury Prevention  Positioning Frequency Able to turn self  Mobility  Activity Ambulated in room;Ambulated to bathroom;Transferred:  Bed to chair  Range of Motion/Exercises Active;All extremities  Level of Assistance Independent after set-up  Assistive Device None  Distance Ambulated (ft) 20 ft  Mobility Response Tolerated well  Mobility performed by Nurse    Patient denied any pain or appears in any distress. Will continue to monitor.

## 2021-03-16 NOTE — Consult Note (Signed)
[facility]  Total days of antibiotics 0         Reason for Consult: fever   Referring Physician: ghimire  Principal Problem:   Acute respiratory failure with hypoxia (HCC) Active Problems:   S/P CABG x 5   PAOD (peripheral arterial occlusive disease) (HCC)   Type 2 diabetes mellitus (HCC)   Ischemic cardiomyopathy   LV (left ventricular) mural thrombus   Abdominal pain    HPI: Isaac Ray is a 50 y.o. male with history of CAD s/p CABG and hx of mural thrombus in 2020, HTN, who has received 3 doses of covid vaccine recently returned from [place], [place] on 5/15 via bus. He reports that he wore mask during long bus ride home. He was accompanied by nephew who was in good state of health during bus ride but does recall he was sick earlier in the month. The patient was visiting his family x 4 wk. He did not swim in any lakes, did not tend to any animals, no sick contacts that he recalls. Upon coming back to town, he had acute abdominal pain (no N/V/diarrhea) that brought him to the ED- which now has resolved. In the meantime, he has had fevers in the last 2 days with increased leukocytosis. He has mild dry cough that he attributes to air conditioning. Infectious work up is negative thus far. (blood cx,covid, hiv). Chest CT-A negative to rule out pe since has mural thrombus noted on TTE (he had been off of coumadin x 2 wk). Abd/pelvis CT no noteable abnormalities.  No other complaints  12 point ros is negative  Past Medical History:  Diagnosis Date  . CAD (coronary artery disease)   . Essential hypertension   . Heart murmur    when I was a teenager- no mention of it now  . Hyperlipidemia   . 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 Type 2 diabetes mellitus (HCC)    Type II    Allergies: No Known Allergies   MEDICATIONS: . atorvastatin  80 mg Oral Daily  . carvedilol  3.125 mg Oral BID WC  . clopidogrel  75 mg  Oral Q breakfast  . insulin aspart  0-9 Units Subcutaneous TID WC  . pantoprazole  40 mg Oral BID  . sacubitril-valsartan  1 tablet Oral BID  . sodium chloride flush  3 mL Intravenous Q12H  . warfarin  7.5 mg Oral ONCE-1600  . Warfarin - Pharmacist Dosing Inpatient   Does not apply q1600    Social History   Tobacco Use  . Smoking status: Former Smoker    Years: 30.00    Quit date: 02/16/2015    Years since quitting: 6.0  . Smokeless tobacco: Never Used  Vaping Use  . Vaping Use: Never used  Substance Use Topics  . Alcohol use: No    Alcohol/week: 0.0 standard drinks  . Drug use: No    Family History  Problem Relation Age of Onset  . Coronary artery disease Father        had CABG  . Heart disease Father      Review of Systems  Constitutional: Negative for fever, chills, diaphoresis, activity change, appetite change, fatigue and unexpected weight change.  HENT: Negative for congestion, sore throat, rhinorrhea, sneezing, trouble swallowing and sinus pressure.  Eyes: Negative for photophobia and visual disturbance.  Respiratory: Negative for cough, chest tightness, shortness of breath, wheezing and stridor.  Cardiovascular: Negative  for chest pain, palpitations and leg swelling.  Gastrointestinal: Negative for nausea, vomiting, abdominal pain, diarrhea, constipation, blood in stool, abdominal distention and anal bleeding.  Genitourinary: Negative for dysuria, hematuria, flank pain and difficulty urinating.  Musculoskeletal: Negative for myalgias, back pain, joint swelling, arthralgias and gait problem.  Skin: Negative for color change, pallor, rash and wound.  Neurological: Negative for dizziness, tremors, weakness and light-headedness.  Hematological: Negative for adenopathy. Does not bruise/bleed easily.  Psychiatric/Behavioral: Negative for behavioral problems, confusion, sleep disturbance, dysphoric mood, decreased concentration and agitation.     OBJECTIVE: Temp:   [97.6 F (36.4 C)-101.2 F (38.4 C)] 97.6 F (36.4 C) (05/19 1154) Pulse Rate:  [71-94] 71 (05/19 1154) Resp:  [17-19] 18 (05/19 1154) BP: (98-115)/(64-76) 98/64 (05/19 1154) SpO2:  [94 %-96 %] 96 % (05/19 1154) Weight:  [72.2 kg] 72.2 kg (05/19 0423) Physical Exam  Constitutional: He is oriented to person, place, and time. He appears well-developed and well-nourished. No distress.  HENT:  Mouth/Throat: Oropharynx is clear and moist. No oropharyngeal exudate.  Cardiovascular: Normal rate, regular rhythm and normal heart sounds. Exam reveals no gallop and no friction rub.  No murmur heard.  Pulmonary/Chest: Effort normal and breath sounds normal. No respiratory distress. He has no wheezes.  Abdominal: Soft. Bowel sounds are normal. He exhibits no distension. There is no tenderness.  Lymphadenopathy:  He has no cervical adenopathy.  Neurological: He is alert and oriented to person, place, and time.  Skin: Skin is warm and dry. Hyperpigmented macules, old scars noted on lower extremities Psychiatric: He has a normal mood and affect. His behavior is normal.    LABS: Results for orders placed or performed during the hospital encounter of 03/13/21 (from the past 48 hour(s))  Glucose, capillary     Status: Abnormal   Collection Time: 03/14/21  4:24 PM  Result Value Ref Range   Glucose-Capillary 181 (H) 70 - 99 mg/dL    Comment: Glucose reference range applies only to samples taken after fasting for at least 8 hours.  Glucose, capillary     Status: Abnormal   Collection Time: 03/14/21  9:03 PM  Result Value Ref Range   Glucose-Capillary 154 (H) 70 - 99 mg/dL    Comment: Glucose reference range applies only to samples taken after fasting for at least 8 hours.  Heparin level (unfractionated)     Status: Abnormal   Collection Time: 03/15/21 12:26 AM  Result Value Ref Range   Heparin Unfractionated 0.17 (L) 0.30 - 0.70 IU/mL    Comment: (NOTE) The clinical reportable range upper limit is  being lowered to >1.10 to align with the FDA approved guidance for the current laboratory assay.  If heparin results are below expected values, and patient dosage has  been confirmed, suggest follow up testing of antithrombin III levels. Performed at [facility], [address] [place], [place] [address]   HIV Antibody (routine testing w rflx)     Status: None   Collection Time: 03/15/21  2:56 AM  Result Value Ref Range   HIV Screen 4th Generation wRfx Non Reactive Non Reactive    Comment: Performed at [facility], [address] [place], [place] [address]  Hemoglobin A1c     Status: Abnormal   Collection Time: 03/15/21  2:56 AM  Result Value Ref Range   Hgb A1c MFr Bld 9.3 (H) 4.8 - 5.6 %    Comment: (NOTE) Pre diabetes:  5.7%-6.4%  Diabetes:              >6.4%  Glycemic control for   <7.0% adults with diabetes    Mean Plasma Glucose 220.21 mg/dL    Comment: Performed at [facility], [address] [place], [place] [address]  Protime-INR     Status: Abnormal   Collection Time: 03/15/21  2:56 AM  Result Value Ref Range   Prothrombin Time 15.8 (H) 11.4 - 15.2 seconds   INR 1.3 (H) 0.8 - 1.2    Comment: (NOTE) INR goal varies based on device and disease states. Performed at [facility], [address], [place], [place] [address]   CBC     Status: Abnormal   Collection Time: 03/15/21  2:56 AM  Result Value Ref Range   WBC 18.0 (H) 4.0 - 10.5 K/uL   RBC 5.10 4.22 - 5.81 MIL/uL   Hemoglobin 16.1 13.0 - 17.0 g/dL   HCT 19.4 17.4 - 08.1 %   MCV 91.0 80.0 - 100.0 fL   MCH 31.6 26.0 - 34.0 pg   MCHC 34.7 30.0 - 36.0 g/dL   RDW 44.8 18.5 - 63.1 %   Platelets 188 150 - 400 K/uL   nRBC 0.0 0.0 - 0.2 %    Comment: Performed at [facility], [address] [place], [place] [address]  Comprehensive metabolic panel     Status: Abnormal   Collection Time: 03/15/21  2:56 AM  Result Value Ref Range   Sodium 133 (L) 135 - 145 mmol/L    Potassium 3.3 (L) 3.5 - 5.1 mmol/L   Chloride 100 98 - 111 mmol/L   CO2 19 (L) 22 - 32 mmol/L   Glucose, Bld 147 (H) 70 - 99 mg/dL    Comment: Glucose reference range applies only to samples taken after fasting for at least 8 hours.   BUN 13 6 - 20 mg/dL   Creatinine, Ser 6.37 (H) 0.61 - 1.24 mg/dL   Calcium 9.3 8.9 - 85.8 mg/dL   Total Protein 7.1 6.5 - 8.1 g/dL   Albumin 3.5 3.5 - 5.0 g/dL   AST 17 15 - 41 U/L   ALT 16 0 - 44 U/L   Alkaline Phosphatase 60 38 - 126 U/L   Total Bilirubin 1.2 0.3 - 1.2 mg/dL   GFR, Estimated >85 >02 mL/min    Comment: (NOTE) Calculated using the CKD-EPI Creatinine Equation (2021)    Anion gap 14 5 - 15    Comment: Performed at [facility], [address] [place], [place] [address]  Lactic acid, plasma     Status: None   Collection Time: 03/15/21  2:56 AM  Result Value Ref Range   Lactic Acid, Venous 1.1 0.5 - 1.9 mmol/L    Comment: Performed at [facility], [address], [place], [place] [address]  Glucose, capillary     Status: Abnormal   Collection Time: 03/15/21  5:59 AM  Result Value Ref Range   Glucose-Capillary 165 (H) 70 - 99 mg/dL    Comment: Glucose reference range applies only to samples taken after fasting for at least 8 hours.  Heparin level (unfractionated)     Status: None   Collection Time: 03/15/21  6:49 AM  Result Value Ref Range   Heparin Unfractionated 0.34 0.30 - 0.70 IU/mL    Comment: (NOTE) The clinical reportable range upper limit is being lowered to >1.10 to align with the  FDA approved guidance for the current laboratory assay.  If heparin results are below expected values, and patient dosage has  been confirmed, suggest follow up testing of antithrombin III levels. Performed at [facility], [address] [place], [place] [address]   Glucose, capillary     Status: Abnormal   Collection Time: 03/15/21 11:28 AM  Result Value Ref Range   Glucose-Capillary 152 (H) 70 - 99 mg/dL    Comment:  Glucose reference range applies only to samples taken after fasting for at least 8 hours.  Glucose, capillary     Status: Abnormal   Collection Time: 03/15/21  4:09 PM  Result Value Ref Range   Glucose-Capillary 216 (H) 70 - 99 mg/dL    Comment: Glucose reference range applies only to samples taken after fasting for at least 8 hours.  Glucose, capillary     Status: Abnormal   Collection Time: 03/15/21  9:05 PM  Result Value Ref Range   Glucose-Capillary 173 (H) 70 - 99 mg/dL    Comment: Glucose reference range applies only to samples taken after fasting for at least 8 hours.  Protime-INR     Status: Abnormal   Collection Time: 03/16/21  1:55 AM  Result Value Ref Range   Prothrombin Time 19.2 (H) 11.4 - 15.2 seconds   INR 1.6 (H) 0.8 - 1.2    Comment: (NOTE) INR goal varies based on device and disease states. Performed at [facility], [address], [place], [place] [address]   CBC     Status: Abnormal   Collection Time: 03/16/21  1:55 AM  Result Value Ref Range   WBC 18.1 (H) 4.0 - 10.5 K/uL   RBC 4.95 4.22 - 5.81 MIL/uL   Hemoglobin 15.6 13.0 - 17.0 g/dL   HCT 83.1 51.7 - 61.6 %   MCV 89.5 80.0 - 100.0 fL   MCH 31.5 26.0 - 34.0 pg   MCHC 35.2 30.0 - 36.0 g/dL   RDW 07.3 71.0 - 62.6 %   Platelets 206 150 - 400 K/uL   nRBC 0.0 0.0 - 0.2 %    Comment: Performed at [facility], [address] [place], [place] [address]  Heparin level (unfractionated)     Status: None   Collection Time: 03/16/21  1:55 AM  Result Value Ref Range   Heparin Unfractionated 0.55 0.30 - 0.70 IU/mL    Comment: (NOTE) The clinical reportable range upper limit is being lowered to >1.10 to align with the FDA approved guidance for the current laboratory assay.  If heparin results are below expected values, and patient dosage has  been confirmed, suggest follow up testing of antithrombin III levels. Performed at [facility], [address] [place], [place] [address]   Basic  metabolic panel     Status: Abnormal   Collection Time: 03/16/21  1:55 AM  Result Value Ref Range   Sodium 131 (L) 135 - 145 mmol/L   Potassium 3.7 3.5 - 5.1 mmol/L   Chloride 100 98 - 111 mmol/L   CO2 18 (L) 22 - 32 mmol/L   Glucose, Bld 148 (H) 70 - 99 mg/dL    Comment: Glucose reference range applies only to samples taken after fasting for at least 8 hours.   BUN 20 6 - 20 mg/dL   Creatinine, Ser 5.00 (H) 0.61 - 1.24 mg/dL   Calcium 8.8 (L) 8.9 - 10.3 mg/dL   GFR, Estimated >93 >81 mL/min  Comment: (NOTE) Calculated using the CKD-EPI Creatinine Equation (2021)    Anion gap 13 5 - 15    Comment: Performed at [facility], [address], [place], [place] [address]  Glucose, capillary     Status: Abnormal   Collection Time: 03/16/21  6:06 AM  Result Value Ref Range   Glucose-Capillary 151 (H) 70 - 99 mg/dL    Comment: Glucose reference range applies only to samples taken after fasting for at least 8 hours.  Glucose, capillary     Status: Abnormal   Collection Time: 03/16/21 11:08 AM  Result Value Ref Range   Glucose-Capillary 124 (H) 70 - 99 mg/dL    Comment: Glucose reference range applies only to samples taken after fasting for at least 8 hours.    MICRO: reviewed IMAGING: No results found.   Assessment/Plan:  50yo M with fever, leukocytosis and resolved abdominal pain. Recently returned from [place]. Fevers could be viral vs bacterial etiology.  - will recheck covid-19 pcr (possible exposure in [place] and first tested in incubation period) - will check hepatitis A,  Get differential to see if eosinophilia to suggest more of parasitic type of infection - will get blood cx to see if bacteremia/typhoid? - will stop giving antipyretics and see his fever curve - clinically stable. Don't need to start abtx at this time  Aram Beecham B. Drue Second MD MPH [facility] [phone]

## 2021-03-17 ENCOUNTER — Other Ambulatory Visit (HOSPITAL_COMMUNITY): Payer: Self-pay

## 2021-03-17 DIAGNOSIS — R509 Fever, unspecified: Secondary | ICD-10-CM

## 2021-03-17 LAB — COMPREHENSIVE METABOLIC PANEL
ALT: 16 U/L (ref 0–44)
AST: 17 U/L (ref 15–41)
Albumin: 2.8 g/dL — ABNORMAL LOW (ref 3.5–5.0)
Alkaline Phosphatase: 57 U/L (ref 38–126)
Anion gap: 12 (ref 5–15)
BUN: 21 mg/dL — ABNORMAL HIGH (ref 6–20)
CO2: 19 mmol/L — ABNORMAL LOW (ref 22–32)
Calcium: 8.5 mg/dL — ABNORMAL LOW (ref 8.9–10.3)
Chloride: 101 mmol/L (ref 98–111)
Creatinine, Ser: 1.06 mg/dL (ref 0.61–1.24)
GFR, Estimated: >60 mL/min (ref >60)
Glucose, Bld: 140 mg/dL — ABNORMAL HIGH (ref 70–99)
Potassium: 3.2 mmol/L — ABNORMAL LOW (ref 3.5–5.1)
Sodium: 132 mmol/L — ABNORMAL LOW (ref 135–145)
Total Bilirubin: 1 mg/dL (ref 0.3–1.2)
Total Protein: 6.7 g/dL (ref 6.5–8.1)

## 2021-03-17 LAB — CBC
HCT: 43.2 % (ref 39.0–52.0)
Hemoglobin: 15.1 g/dL (ref 13.0–17.0)
MCH: 31.4 pg (ref 26.0–34.0)
MCHC: 35 g/dL (ref 30.0–36.0)
MCV: 89.8 fL (ref 80.0–100.0)
Platelets: 210 10*3/uL (ref 150–400)
RBC: 4.81 MIL/uL (ref 4.22–5.81)
RDW: 12.8 % (ref 11.5–15.5)
WBC: 12.3 10*3/uL — ABNORMAL HIGH (ref 4.0–10.5)
nRBC: 0 % (ref 0.0–0.2)

## 2021-03-17 LAB — HEPARIN LEVEL (UNFRACTIONATED): Heparin Unfractionated: 0.61 IU/mL (ref 0.30–0.70)

## 2021-03-17 LAB — PROTIME-INR
INR: 1.9 — ABNORMAL HIGH (ref 0.8–1.2)
Prothrombin Time: 21.4 seconds — ABNORMAL HIGH (ref 11.4–15.2)

## 2021-03-17 LAB — GLUCOSE, CAPILLARY
Glucose-Capillary: 152 mg/dL — ABNORMAL HIGH (ref 70–99)
Glucose-Capillary: 248 mg/dL — ABNORMAL HIGH (ref 70–99)

## 2021-03-17 LAB — STRONGYLOIDES, AB, IGG: Strongyloides, Ab, IgG: NEGATIVE

## 2021-03-17 MED ORDER — SPIRONOLACTONE 12.5 MG HALF TABLET
12.5000 mg | ORAL_TABLET | Freq: Every day | ORAL | Status: DC
  Administered 2021-03-17: 12.5 mg via ORAL
  Filled 2021-03-17: qty 1

## 2021-03-17 MED ORDER — SACUBITRIL-VALSARTAN 24-26 MG PO TABS
1.0000 | ORAL_TABLET | Freq: Two times a day (BID) | ORAL | 0 refills | Status: DC
  Filled 2021-03-17: qty 60, 30d supply, fill #0

## 2021-03-17 MED ORDER — WARFARIN SODIUM 5 MG PO TABS
7.5000 mg | ORAL_TABLET | ORAL | 0 refills | Status: DC
  Filled 2021-03-17: qty 60, 30d supply, fill #0

## 2021-03-17 MED ORDER — CLOPIDOGREL BISULFATE 75 MG PO TABS
75.0000 mg | ORAL_TABLET | Freq: Every day | ORAL | 0 refills | Status: DC
Start: 2021-03-17 — End: 2021-04-27
  Filled 2021-03-17: qty 30, 30d supply, fill #0

## 2021-03-17 MED ORDER — GLIPIZIDE ER 2.5 MG PO TB24
2.5000 mg | ORAL_TABLET | Freq: Every day | ORAL | 0 refills | Status: DC
  Filled 2021-03-17: qty 30, 30d supply, fill #0

## 2021-03-17 MED ORDER — SPIRONOLACTONE 25 MG PO TABS
12.5000 mg | ORAL_TABLET | Freq: Every day | ORAL | 0 refills | Status: DC
  Filled 2021-03-17: qty 30, 30d supply, fill #0

## 2021-03-17 MED ORDER — METFORMIN HCL 1000 MG PO TABS
1000.0000 mg | ORAL_TABLET | Freq: Two times a day (BID) | ORAL | 0 refills | Status: DC
  Filled 2021-03-17: qty 60, 30d supply, fill #0

## 2021-03-17 MED ORDER — WARFARIN SODIUM 7.5 MG PO TABS
7.5000 mg | ORAL_TABLET | Freq: Once | ORAL | Status: DC
  Filled 2021-03-17: qty 1

## 2021-03-17 MED ORDER — CARVEDILOL 3.125 MG PO TABS
3.1250 mg | ORAL_TABLET | Freq: Two times a day (BID) | ORAL | 0 refills | Status: DC
  Filled 2021-03-17: qty 60, 30d supply, fill #0

## 2021-03-17 MED ORDER — EMPAGLIFLOZIN 10 MG PO TABS
10.0000 mg | ORAL_TABLET | Freq: Every day | ORAL | 0 refills | Status: DC
  Filled 2021-03-17: qty 14, 14d supply, fill #0

## 2021-03-17 MED ORDER — ATORVASTATIN CALCIUM 80 MG PO TABS
80.0000 mg | ORAL_TABLET | Freq: Every day | ORAL | 0 refills | Status: DC
  Filled 2021-03-17: qty 30, 30d supply, fill #0

## 2021-03-17 MED ORDER — POTASSIUM CHLORIDE CRYS ER 20 MEQ PO TBCR
40.0000 meq | EXTENDED_RELEASE_TABLET | Freq: Once | ORAL | Status: AC
  Administered 2021-03-17: 40 meq via ORAL
  Filled 2021-03-17: qty 2

## 2021-03-17 NOTE — Progress Notes (Signed)
RN gave pt discharge instructions with the help of Susana #[id number] stratus interpretor. IV has been removed and the patient is getting dressed. Wife at bedside to DC.

## 2021-03-17 NOTE — Discharge Summary (Addendum)
PATIENT DETAILS Name: Isaac Ray Age: 50 y.o. Sex: male Date of Birth: 06/06/1971 MRN: [id number]. Admitting Physician: Maretta Bees, MD IDP:[id number], Odette Horns, MD  Admit Date: 03/13/2021 Discharge date: 03/17/2021  Recommendations for Outpatient Follow-up:  1. Follow up with PCP in 1-2 weeks 2. Please obtain CMP/CBC in one week 3. Please ensure follow-up with the Coumadin clinic. 4. Please follow blood cultures drawn on 5/17, 5/19 until final.  Admitted From:  Home  Disposition: Home   Home Health: No  Equipment/Devices: None  Discharge Condition: Stable  CODE STATUS: FULL CODE  Diet recommendation:  Diet Order            Diet - low sodium heart healthy           Diet Carb Modified           Diet heart healthy/carb modified Room service appropriate? Yes; Fluid consistency: Thin  Diet effective now                 Brief Narrative: Patient is a 50 y.o. 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  He recently spent 1 month in [place]-and returned back to the [place] on 5/15.  Abdominal pain resolved with supportive care-CT imaging was negative for acute abnormalities.  Hospital course was then complicated by development of leukocytosis and fever.  See below for further details.  Significant events: 5/16>> admit for evaluation of abdominal pain. 5/18>> febrile to 101.2  Significant studies: 5/16>> CT abdomen/pelvis (no contrast): No acute abdominal/pelvic abnormality. 5/17>> CT angio chest: No PE, mild cardiomegaly, diffuse bronchial wall thickening. 5/17>> Echo: EF 25-30%, global hypokinesis.  Grade 2 diastolic dysfunction.  Small apical echodensity seen in contrasted images.  Antimicrobial therapy: None  Microbiology data: 5/16>> COVID-19 PCR: Negative 5/17>> blood cultures: No growth 5/19>> COVID PCR:  Negative 5/19>> blood culture: No growth  Procedures : None  Consults: ID   Brief Hospital Course: Abdominal pain: Unclear etiology-has COMPLETELY resolved as of 5/17.  Doubt any further work-up required given complete resolution of the pain and benign exam.  Fever with leukocytosis:  Unclear etiology-blood cultures x2 and COVID PCR x2 was negative.  CT chest/abdomen was negative for any acute abnormalities.  Infectious disease was consulted-since work-up was negative-leukocytosis has down trended significantly-fever curve is better-suspicion that he may have had a viral syndrome.  Discussed with infectious disease MD-Dr. Doristine Church to discharge-blood cultures will be followed closely-if positive-he will be called back to the hospital.  Discussed this plan with the patient-he is agreeable-and anxious to go home.   AKI: Mild-hold Lasix-volume status stable-follow.  Acute hypoxic respiratory failure due to HFrEF with mild exacerbation: Hypoxia resolved-on room air.  Holding Lasix due to AKI.  HFrEF with mild exacerbation: Remains euvolemic-initially required IV Lasix-stable to be discharged back on his usual regimen of Aldactone/Entresto/Coreg  CAD s/p CABG: No anginal symptoms-continue Plavix/statin/Coreg  History of PAD: Continue antiplatelet/statin.  History of LV thrombus: Noncompliant to Coumadin-claims he stopped taking for approximately 2 weeks when he was in [place] city.  Repeat echo continues to show what looks like a thrombus in the LV apex.  Managed with IV heparin-and overlapping Coumadin-INR 1.9 on the day of discharge.  Will place back on his usual home Coumadin regimen-I have asked case management-and also have asked his daughter (spoke on the phone) to see if we can get him a appointment at  the Coumadin clinic this coming Monday.  DM-2: CBG stable-resume oral hypoglycemic agents on discharge.  Follow with primary care practitioner for further  optimization.   Discharge Diagnoses:  Principal Problem:   Acute respiratory failure with hypoxia (HCC) Active Problems:   S/P CABG x 5   PAOD (peripheral arterial occlusive disease) (HCC)   Type 2 diabetes mellitus (HCC)   Ischemic cardiomyopathy   LV (left ventricular) mural thrombus   Abdominal pain   Discharge Instructions:  Activity:  As tolerated   Discharge Instructions    (HEART FAILURE PATIENTS) Call MD:  Anytime you have any of the following symptoms: 1) 3 pound weight gain in 24 hours or 5 pounds in 1 week 2) shortness of breath, with or without a dry hacking cough 3) swelling in the hands, feet or stomach 4) if you have to sleep on extra pillows at night in order to breathe.   Complete by: As directed    Call MD for:  difficulty breathing, headache or visual disturbances   Complete by: As directed    Diet - low sodium heart healthy   Complete by: As directed    Diet Carb Modified   Complete by: As directed    Increase activity slowly   Complete by: As directed      Allergies as of 03/17/2021   No Known Allergies     Medication List    TAKE these medications   acetaminophen 325 MG tablet Commonly known as: TYLENOL Take 2 tablets (650 mg total) by mouth every 6 (six) hours as needed for mild pain (or temp > 100).   atorvastatin 80 MG tablet Commonly known as: LIPITOR Take 1 tablet (80 mg total) by mouth daily.   carvedilol 3.125 MG tablet Commonly known as: COREG Take 1 tablet (3.125 mg total) by mouth 2 (two) times daily with a meal.   clopidogrel 75 MG tablet Commonly known as: PLAVIX Take 1 tablet (75 mg total) by mouth daily with breakfast.   empagliflozin 10 MG Tabs tablet Commonly known as: JARDIANCE Take 1 tablet (10 mg total) by mouth daily before breakfast.   glipiZIDE 2.5 MG 24 hr tablet Commonly known as: GLUCOTROL XL Take 1 tablet (2.5 mg total) by mouth daily with breakfast.   metFORMIN 1000 MG tablet Commonly known as:  GLUCOPHAGE Take 1 tablet (1,000 mg total) by mouth 2 (two) times daily with a meal.   sacubitril-valsartan 24-26 MG Commonly known as: ENTRESTO TAKE 1 TABLET BY MOUTH 2 (TWO) TIMES DAILY.   spironolactone 25 MG tablet Commonly known as: ALDACTONE Take 0.5 tablets (12.5 mg total) by mouth daily.   warfarin 5 MG tablet Commonly known as: COUMADIN Take as directed. If you are unsure how to take this medication, talk to your nurse or doctor. Original instructions: Take 1.5 tablets (7.5 mg total) by mouth See admin instructions. 7.5 mg Monday,Wednesday and Friday. 5 mg  Tuesday,Thursday,Saturday and sunday       Follow-up Information    Hoy Register, MD. Schedule an appointment as soon as possible for a visit.   Specialty: Family Medicine Contact information: [address] [place] [address] [phone]        [place], Peter M, MD Follow up.   Specialty: Cardiology Why: INR check at 1:15 pm ON Monday 23, 2022  Contact information: [address] [place] [address] [phone]        Runell Gess, MD. Schedule an appointment as soon as possible for  a visit in 1 week(s).   Specialties: Cardiology, Radiology Contact information: [address] [place] [address] [phone]              No Known Allergies   Other Procedures/Studies: CT ABDOMEN PELVIS WO CONTRAST  Result Date: 03/13/2021 CLINICAL DATA:  Mid abdominal pain EXAM: CT ABDOMEN AND PELVIS WITHOUT CONTRAST TECHNIQUE: Multidetector CT imaging of the abdomen and pelvis was performed following the standard protocol without IV contrast. COMPARISON:  CT 04/25/2019 FINDINGS: Lower chest: Lung bases demonstrate no acute consolidation or pleural effusion. Coronary stent. Hepatobiliary: No focal liver abnormality is seen. No gallstones, gallbladder wall thickening, or biliary dilatation. Pancreas: Unremarkable. No pancreatic ductal dilatation or surrounding  inflammatory changes. Spleen: Normal in size without focal abnormality. Adrenals/Urinary Tract: Adrenal glands are unremarkable. Kidneys are normal, without renal calculi, focal lesion, or hydronephrosis. Bladder is unremarkable. Stomach/Bowel: Stomach is within normal limits. Status post appendectomy. No evidence of bowel wall thickening, distention, or inflammatory changes. Vascular/Lymphatic: Mild to moderate aortic atherosclerosis. No aneurysm. No suspicious nodes Reproductive: Prostate is unremarkable. Other: Negative for free air or free fluid. Fat containing inguinal hernias Musculoskeletal: No acute or significant osseous findings. IMPRESSION: No CT evidence for acute intra-abdominal or pelvic abnormality Electronically Signed   By: Jasmine Pang M.D.   On: 03/13/2021 22:12   CT Angio Chest PE W and/or Wo Contrast  Result Date: 03/14/2021 CLINICAL DATA:  Hypoxia EXAM: CT ANGIOGRAPHY CHEST WITH CONTRAST TECHNIQUE: Multidetector CT imaging of the chest was performed using the standard protocol during bolus administration of intravenous contrast. Multiplanar CT image reconstructions and MIPs were obtained to evaluate the vascular anatomy. CONTRAST:  46mL OMNIPAQUE IOHEXOL 350 MG/ML SOLN COMPARISON:  None. FINDINGS: Cardiovascular: There is excellent opacification of the pulmonary arterial tree. No intraluminal filling defect identified to suggest acute pulmonary embolism. Central pulmonary arteries are of normal caliber. There is mild cardiomegaly with left ventricular dilation noted. Subendocardial fatty infiltration and thinning of the left ventricular apex suggests prior myocardial infarction in this location. Coronary artery bypass grafting and coronary artery stenting has been performed. No pericardial effusion. The thoracic aorta is unremarkable. Mediastinum/Nodes: Thyroid unremarkable. Esophagus unremarkable. Shotty right hilar adenopathy may be reactive in nature. No frankly pathologic thoracic  adenopathy. Lungs/Pleura: Mild bibasilar atelectasis. No superimposed confluent pulmonary infiltrate or suspicious focal pulmonary nodule. No pneumothorax or pleural effusion. There is diffuse bronchial wall thickening noted centrally in keeping with airway inflammation. A small amount of debris is seen within the a distal trachea. Upper Abdomen: No acute abnormality. Musculoskeletal: No acute abnormality. No lytic or blastic bone lesion. Review of the MIP images confirms the above findings. IMPRESSION: No pulmonary embolism. Mild cardiomegaly with marked left ventricular dilation. Subendocardial fatty infiltration and thinning of the a left ventricular apex suggest prior myocardial infarction in this location. Diffuse bronchial wall thickening in keeping with airway inflammation. If acute, this can be seen the setting of atypical infection or reactive airway disease. No confluent pulmonary infiltrate. No central obstructing lesion. Electronically Signed   By: Helyn Numbers MD   On: 03/14/2021 01:25   DG Chest Portable 1 View  Result Date: 03/14/2021 CLINICAL DATA:  Hypoxia EXAM: PORTABLE CHEST 1 VIEW COMPARISON:  05/03/2015 FINDINGS: Lungs are clear. No pneumothorax or pleural effusion. Coronary artery bypass grafting has been performed. Mild cardiomegaly has developed, new since prior examination. Pulmonary vascularity is normal. No acute bone abnormality. IMPRESSION: Interval development of mild cardiomegaly. Electronically Signed   By: Helyn Numbers  MD   On: 03/14/2021 00:16   ECHOCARDIOGRAM COMPLETE  Result Date: 03/14/2021    ECHOCARDIOGRAM REPORT   Patient Name:   ORVILL COULTHARD Date of Exam: 03/14/2021 Medical Rec #:  [id number]               Height:       67.0 in Accession #:    [id number]              Weight:       174.8 lb Date of Birth:  [birth year]              BSA:          1.910 m Patient Age:    49 years                BP:           154/79 mmHg Patient Gender: M                        HR:           50 bpm. Exam Location:  Inpatient Procedure: 2D Echo, Cardiac Doppler and Color Doppler Indications:    CHF  History:        Patient has prior history of Echocardiogram examinations, most                 recent 11/03/2019. CAD, Prior CABG; Risk Factors:Former Smoker,                 Diabetes and Dyslipidemia.  Sonographer:    Shirlean Kelly Referring Phys: [id number] TIMOTHY S OPYD IMPRESSIONS  1. Left ventricular ejection fraction, by estimation, is 25 to 30%. The left ventricle has severely decreased function. The left ventricle demonstrates global hypokinesis. The left ventricular internal cavity size was severely dilated. Left ventricular diastolic parameters are consistent with Grade II diastolic dysfunction (pseudonormalization). There is a small apical echodensity seen in the contrasted images.  2. Right ventricular systolic function is normal. The right ventricular size is normal.  3. Left atrial size was mildly dilated.  4. The mitral valve is grossly normal. Mild mitral valve regurgitation. No evidence of mitral stenosis.  5. The aortic valve is tricuspid. Aortic valve regurgitation is not visualized. No aortic stenosis is present.  6. The inferior vena cava is normal in size with greater than 50% respiratory variability, suggesting right atrial pressure of 3 mmHg. Comparison(s): A prior study was performed on 11/03/2019. Prior images reviewed side by side. Compared to 04/25/19 study, echo density is much improved and similar to 11/03/19 study. FINDINGS  Left Ventricle: Left ventricular ejection fraction, by estimation, is 25 to 30%. The left ventricle has severely decreased function. The left ventricle demonstrates global hypokinesis. The left ventricular internal cavity size was severely dilated. There is no left ventricular hypertrophy. Left ventricular diastolic parameters are consistent with Grade II diastolic dysfunction (pseudonormalization). Right Ventricle: The right ventricular size is  normal. No increase in right ventricular wall thickness. Right ventricular systolic function is normal. Left Atrium: Left atrial size was mildly dilated. Right Atrium: Right atrial size was normal in size. Pericardium: There is no evidence of pericardial effusion. Presence of pericardial fat pad. Mitral Valve: The mitral valve is grossly normal. Mild mitral valve regurgitation. No evidence of mitral valve stenosis. Tricuspid Valve: The tricuspid valve is normal in structure. Tricuspid valve regurgitation is not demonstrated. Aortic Valve: The aortic valve is tricuspid. Aortic valve regurgitation is not  visualized. No aortic stenosis is present. Aortic valve mean gradient measures 8.0 mmHg. Aortic valve peak gradient measures 18.3 mmHg. Aortic valve area, by VTI measures 1.44  cm. Pulmonic Valve: The pulmonic valve was normal in structure. Pulmonic valve regurgitation is not visualized. No evidence of pulmonic stenosis. Aorta: The aortic root and ascending aorta are structurally normal, with no evidence of dilitation. Venous: The inferior vena cava is normal in size with greater than 50% respiratory variability, suggesting right atrial pressure of 3 mmHg. IAS/Shunts: The atrial septum is grossly normal.  LEFT VENTRICLE PLAX 2D LVIDd:         7.10 cm LVIDs:         6.00 cm LV PW:         1.10 cm LV IVS:        1.00 cm LVOT diam:     2.00 cm LV SV:         55 LV SV Index:   29 LVOT Area:     3.14 cm  RIGHT VENTRICLE             IVC RV S prime:     10.00 cm/s  IVC diam: 1.80 cm TAPSE (M-mode): 1.9 cm LEFT ATRIUM             Index       RIGHT ATRIUM           Index LA diam:        4.40 cm 2.30 cm/m  RA Area:     14.50 cm LA Vol (A2C):   55.7 ml 29.17 ml/m RA Volume:   34.10 ml  17.86 ml/m LA Vol (A4C):   68.3 ml 35.76 ml/m LA Biplane Vol: 65.0 ml 34.04 ml/m  AORTIC VALVE AV Area (Vmax):    1.57 cm AV Area (Vmean):   1.54 cm AV Area (VTI):     1.44 cm AV Vmax:           214.00 cm/s AV Vmean:          123.000  cm/s AV VTI:            0.382 m AV Peak Grad:      18.3 mmHg AV Mean Grad:      8.0 mmHg LVOT Vmax:         107.00 cm/s LVOT Vmean:        60.100 cm/s LVOT VTI:          0.175 m LVOT/AV VTI ratio: 0.46  AORTA Ao Root diam: 3.00 cm Ao Asc diam:  2.90 cm MITRAL VALVE MV Area (PHT): 3.65 cm     SHUNTS MV Decel Time: 208 msec     Systemic VTI:  0.18 m MV E velocity: 120.00 cm/s  Systemic Diam: 2.00 cm MV A velocity: 94.00 cm/s MV E/A ratio:  1.28 Riley Lam MD Electronically signed by Riley Lam MD Signature Date/Time: 03/14/2021/11:47:29 AM    Final      TODAY-DAY OF DISCHARGE:  Subjective:   Mandeep Lozano-Villareal today has no headache,no chest abdominal pain,no new weakness tingling or numbness, feels much better wants to go home today.   Objective:   Blood pressure 109/66, pulse 67, temperature 98.9 F (37.2 C), temperature source Oral, resp. rate 18, height 7' (2.134 m), weight 72.3 kg, SpO2 93 %.  Intake/Output Summary (Last 24 hours) at 03/17/2021 1026 Last data filed at 03/17/2021 0527 Gross per 24 hour  Intake 133.49 ml  Output 1525 ml  Net -  1391.51 ml   Filed Weights   03/15/21 0414 03/16/21 0423 03/17/21 0521  Weight: 72.2 kg 72.2 kg 72.3 kg    Exam: Awake Alert, Oriented *3, No new F.N deficits, Normal affect [place].AT,PERRAL Supple Neck,No JVD, No cervical lymphadenopathy appriciated.  Symmetrical Chest wall movement, Good air movement bilaterally, CTAB RRR,No Gallops,Rubs or new Murmurs, No Parasternal Heave +ve B.Sounds, Abd Soft, Non tender, No organomegaly appriciated, No rebound -guarding or rigidity. No Cyanosis, Clubbing or edema, No new Rash or bruise   PERTINENT RADIOLOGIC STUDIES: No results found.   PERTINENT LAB RESULTS: CBC: Recent Labs    03/16/21 0155 03/17/21 0222  WBC 18.6*  18.1* 12.3*  HGB 15.6  15.6 15.1  HCT 45.2  44.3 43.2  PLT 186  206 210   CMET CMP     Component Value Date/Time   NA 132 (L) 03/17/2021 0222    NA 141 05/18/2020 1038   K 3.2 (L) 03/17/2021 0222   CL 101 03/17/2021 0222   CO2 19 (L) 03/17/2021 0222   GLUCOSE 140 (H) 03/17/2021 0222   BUN 21 (H) 03/17/2021 0222   BUN 12 05/18/2020 1038   CREATININE 1.06 03/17/2021 0222   CALCIUM 8.5 (L) 03/17/2021 0222   PROT 6.7 03/17/2021 0222   PROT 6.7 05/18/2020 1038   ALBUMIN 2.8 (L) 03/17/2021 0222   ALBUMIN 4.3 05/18/2020 1038   AST 17 03/17/2021 0222   ALT 16 03/17/2021 0222   ALKPHOS 57 03/17/2021 0222   BILITOT 1.0 03/17/2021 0222   BILITOT 0.3 05/18/2020 1038   GFRNONAA >60 03/17/2021 0222   GFRAA 110 05/18/2020 1038    GFR Estimated Creatinine Clearance: 84.3 mL/min (by C-G formula based on SCr of 1.06 mg/dL). No results for input(s): LIPASE, AMYLASE in the last 72 hours. No results for input(s): CKTOTAL, CKMB, CKMBINDEX, TROPONINI in the last 72 hours. Invalid input(s): POCBNP No results for input(s): DDIMER in the last 72 hours. Recent Labs    03/15/21 0256  HGBA1C 9.3*   No results for input(s): CHOL, HDL, LDLCALC, TRIG, CHOLHDL, LDLDIRECT in the last 72 hours. No results for input(s): TSH, T4TOTAL, T3FREE, THYROIDAB in the last 72 hours.  Invalid input(s): FREET3 No results for input(s): VITAMINB12, FOLATE, FERRITIN, TIBC, IRON, RETICCTPCT in the last 72 hours. Coags: Recent Labs    03/16/21 0155 03/17/21 0222  INR 1.6* 1.9*   Microbiology: Recent Results (from the past 240 hour(s))  SARS CORONAVIRUS 2 (TAT 6-24 HRS) Nasopharyngeal Nasopharyngeal Swab     Status: None   Collection Time: 03/13/21 10:36 PM   Specimen: Nasopharyngeal Swab  Result Value Ref Range Status   SARS Coronavirus 2 NEGATIVE NEGATIVE Final    Comment: (NOTE) SARS-CoV-2 target nucleic acids are NOT DETECTED.  The SARS-CoV-2 RNA is generally detectable in upper and lower respiratory specimens during the acute phase of infection. Negative results do not preclude SARS-CoV-2 infection, do not rule out co-infections with other  pathogens, and should not be used as the sole basis for treatment or other patient management decisions. Negative results must be combined with clinical observations, patient history, and epidemiological information. The expected result is Negative.  Fact Sheet for Patients: HairSlick.no  Fact Sheet for Healthcare Providers: quierodirigir.com  This test is not yet approved or cleared by the [place] FDA and  has been authorized for detection and/or diagnosis of SARS-CoV-2 by FDA under an Emergency Use Authorization (EUA). This EUA will remain  in effect (meaning this test can be used) for the  duration of the COVID-19 declaration under Se ction 564(b)(1) of the Act, 21 U.S.C. section 360bbb-3(b)(1), unless the authorization is terminated or revoked sooner.  Performed at [facility], [address] [place], [place] [address]   Culture, blood (routine x 2)     Status: None (Preliminary result)   Collection Time: 03/14/21 11:26 AM   Specimen: BLOOD  Result Value Ref Range Status   Specimen Description BLOOD LEFT ANTECUBITAL  Final   Special Requests   Final    BOTTLES DRAWN AEROBIC AND ANAEROBIC Blood Culture adequate volume   Culture   Final    NO GROWTH 3 DAYS Performed at [facility], [address] [place], [place] [address]    Report Status PENDING  Incomplete  Culture, blood (routine x 2)     Status: None (Preliminary result)   Collection Time: 03/14/21 11:29 AM   Specimen: BLOOD  Result Value Ref Range Status   Specimen Description BLOOD RIGHT ANTECUBITAL  Final   Special Requests   Final    BOTTLES DRAWN AEROBIC AND ANAEROBIC Blood Culture adequate volume   Culture   Final    NO GROWTH 3 DAYS Performed at [facility], [address] [place], [place] [address]    Report Status PENDING  Incomplete  Culture, blood (Routine X 2) w Reflex to ID Panel     Status: None (Preliminary result)    Collection Time: 03/16/21  2:51 PM   Specimen: BLOOD LEFT HAND  Result Value Ref Range Status   Specimen Description BLOOD LEFT HAND  Final   Special Requests   Final    BOTTLES DRAWN AEROBIC AND ANAEROBIC Blood Culture adequate volume   Culture   Final    NO GROWTH < 24 HOURS Performed at [facility], [address] [place], [place] [address]    Report Status PENDING  Incomplete  Resp Panel by RT-PCR (Flu A&B, Covid) Nasopharyngeal Swab     Status: None   Collection Time: 03/16/21  2:51 PM   Specimen: Nasopharyngeal Swab; Nasopharyngeal(NP) swabs in vial transport medium  Result Value Ref Range Status   SARS Coronavirus 2 by RT PCR NEGATIVE NEGATIVE Final    Comment: (NOTE) SARS-CoV-2 target nucleic acids are NOT DETECTED.  The SARS-CoV-2 RNA is generally detectable in upper respiratory specimens during the acute phase of infection. The lowest concentration of SARS-CoV-2 viral copies this assay can detect is 138 copies/mL. A negative result does not preclude SARS-Cov-2 infection and should not be used as the sole basis for treatment or other patient management decisions. A negative result may occur with  improper specimen collection/handling, submission of specimen other than nasopharyngeal swab, presence of viral mutation(s) within the areas targeted by this assay, and inadequate number of viral copies(<138 copies/mL). A negative result must be combined with clinical observations, patient history, and epidemiological information. The expected result is Negative.  Fact Sheet for Patients:  BloggerCourse.comhttps://www.fda.gov/media/152166/download  Fact Sheet for Healthcare Providers:  SeriousBroker.ithttps://www.fda.gov/media/152162/download  This test is no t yet approved or cleared by the [place] FDA and  has been authorized for detection and/or diagnosis of SARS-CoV-2 by FDA under an Emergency Use Authorization (EUA). This EUA will remain  in effect (meaning this test can be used) for the  duration of the COVID-19 declaration under Section 564(b)(1) of the Act, 21 U.S.C.section 360bbb-3(b)(1), unless the authorization is terminated  or revoked sooner.       Influenza A by PCR NEGATIVE  NEGATIVE Final   Influenza B by PCR NEGATIVE NEGATIVE Final    Comment: (NOTE) The Xpert Xpress SARS-CoV-2/FLU/RSV plus assay is intended as an aid in the diagnosis of influenza from Nasopharyngeal swab specimens and should not be used as a sole basis for treatment. Nasal washings and aspirates are unacceptable for Xpert Xpress SARS-CoV-2/FLU/RSV testing.  Fact Sheet for Patients: BloggerCourse.com  Fact Sheet for Healthcare Providers: SeriousBroker.it  This test is not yet approved or cleared by the [place] FDA and has been authorized for detection and/or diagnosis of SARS-CoV-2 by FDA under an Emergency Use Authorization (EUA). This EUA will remain in effect (meaning this test can be used) for the duration of the COVID-19 declaration under Section 564(b)(1) of the Act, 21 U.S.C. section 360bbb-3(b)(1), unless the authorization is terminated or revoked.  Performed at [facility], [name], [place], [place] [address]   Culture, blood (Routine X 2) w Reflex to ID Panel     Status: None (Preliminary result)   Collection Time: 03/16/21  3:09 PM   Specimen: BLOOD  Result Value Ref Range Status   Specimen Description BLOOD RIGHT ANTECUBITAL  Final   Special Requests   Final    BOTTLES DRAWN AEROBIC AND ANAEROBIC Blood Culture results may not be optimal due to an inadequate volume of blood received in culture bottles   Culture   Final    NO GROWTH < 24 HOURS Performed at [facility], [address] [place], [place] [address]    Report Status PENDING  Incomplete    FURTHER DISCHARGE INSTRUCTIONS:  Get Medicines reviewed and adjusted: Please take all your medications with you for your next visit with your  Primary MD  Laboratory/radiological data: Please request your Primary MD to go over all hospital tests and procedure/radiological results at the follow up, please ask your Primary MD to get all Hospital records sent to his/her office.  In some cases, they will be blood work, cultures and biopsy results pending at the time of your discharge. Please request that your primary care M.D. goes through all the records of your hospital data and follows up on these results.  Also Note the following: If you experience worsening of your admission symptoms, develop shortness of breath, life threatening emergency, suicidal or homicidal thoughts you must seek medical attention immediately by calling 911 or calling your MD immediately  if symptoms less severe.  You must read complete instructions/literature along with all the possible adverse reactions/side effects for all the Medicines you take and that have been prescribed to you. Take any new Medicines after you have completely understood and accpet all the possible adverse reactions/side effects.   Do not drive when taking Pain medications or sleeping medications (Benzodaizepines)  Do not take more than prescribed Pain, Sleep and Anxiety Medications. It is not advisable to combine anxiety,sleep and pain medications without talking with your primary care practitioner  Special Instructions: If you have smoked or chewed Tobacco  in the last 2 yrs please stop smoking, stop any regular Alcohol  and or any Recreational drug use.  Wear Seat belts while driving.  Please note: You were cared for by a hospitalist during your hospital stay. Once you are discharged, your primary care physician will handle any further medical issues. Please note that NO REFILLS for any discharge medications will be authorized once you are discharged, as it is imperative that you return to your primary care physician (or establish a relationship with a  primary care physician if you do  not have one) for your post hospital discharge needs so that they can reassess your need for medications and monitor your lab values.  Total Time spent coordinating discharge including counseling, education and face to face time equals 35 minutes.  SignedJeoffrey Massed 03/17/2021 10:26 AM

## 2021-03-17 NOTE — Progress Notes (Signed)
ANTICOAGULATION CONSULT NOTE  Pharmacy Consult:  Heparin / Coumadin Indication: h/o mural thrombosis  No Known Allergies   Vital Signs: Temp: 98.9 F (37.2 C) (05/20 0521) Temp Source: Oral (05/20 0521) BP: 109/66 (05/20 0521) Pulse Rate: 67 (05/20 0521)  Labs: Recent Labs    03/15/21 0256 03/15/21 0649 03/16/21 0155 03/17/21 0222  HGB 16.1  --  15.6  15.6 15.1  HCT 46.4  --  45.2  44.3 43.2  PLT 188  --  186  206 210  LABPROT 15.8*  --  19.2* 21.4*  INR 1.3*  --  1.6* 1.9*  HEPARINUNFRC  --  0.34 0.55 0.61  CREATININE 1.25*  --  1.32* 1.06    Estimated Creatinine Clearance: 84.3 mL/min (by C-G formula based on SCr of 1.06 mg/dL).   Assessment: 49 YOM to continue Coumadin from PTA for history of LV thrombus (last ECHO in 2021 no longer showing evidence of apical thrombus).  Last known dose 7.5 mg MWF and 5mg  other days.  Given subthearpeutic INR on admit - MD wants to bridge with IV heparin.  Heparin level therapeutic and trending up; INR trending up towards goal; no bleeding reported.  Goal of Therapy:  INR 2-3  Heparin level 0.3-0.7 units/mL Monitor platelets by anticoagulation protocol: Yes   Plan:  Reduce heparin gtt slightly to 1400 units/hr Repeat Coumadin 7.5mg  PO today Daily heparin level, PT/INR and CBC  Cesia Orf D. , PharmD, BCPS, BCCCP 03/17/2021, 8:36 AM

## 2021-03-17 NOTE — Care Management (Addendum)
Scheduled INR check for Monday placed on AVS.   PCP is [facility], called first available appointment is April 27, 2021 , they will place him on wait list and if they have a cancellation they will call him. Placed on AVS.   Entered in [place] with zero co pay. TOC pharmacy has a free 2 week card for Jardiance.   MD aware of above.   Interpreter Spero Geralds will met NCM in patient room in 30 minutes to explain above.   Magdalen Spatz RN

## 2021-03-17 NOTE — Progress Notes (Signed)
    [facility]    Date of Admission:  03/13/2021      ID: Isaac Ray is a 50 y.o. male with  Febrile illness returned traveler from [place] Principal Problem:   Acute respiratory failure with hypoxia (HCC) Active Problems:   S/P CABG x 5   PAOD (peripheral arterial occlusive disease) (HCC)   Type 2 diabetes mellitus (HCC)   Ischemic cardiomyopathy   LV (left ventricular) mural thrombus   Abdominal pain    Subjective: Afebrile, slept well. No abdominal pain. No n/v/d  ROs:12 point ros is negative  Medications:  . atorvastatin  80 mg Oral Daily  . carvedilol  3.125 mg Oral BID WC  . clopidogrel  75 mg Oral Q breakfast  . insulin aspart  0-9 Units Subcutaneous TID WC  . pantoprazole  40 mg Oral BID  . sacubitril-valsartan  1 tablet Oral BID  . sodium chloride flush  3 mL Intravenous Q12H  . spironolactone  12.5 mg Oral Daily  . warfarin  7.5 mg Oral ONCE-1600  . Warfarin - Pharmacist Dosing Inpatient   Does not apply q1600    Objective: Vital signs in last 24 hours: Temp:  [98.2 F (36.8 C)-99.9 F (37.7 C)] 98.2 F (36.8 C) (05/20 1146) Pulse Rate:  [63-70] 63 (05/20 1146) Resp:  [18] 18 (05/20 1146) BP: (100-113)/(66-80) 100/70 (05/20 1146) SpO2:  [93 %-97 %] 97 % (05/20 1146) Weight:  [72.3 kg] 72.3 kg (05/20 0521)  Physical Exam  Constitutional: He is oriented to person, place, and time. He appears well-developed and well-nourished. No distress.  HENT:  Mouth/Throat: Oropharynx is clear and moist. No oropharyngeal exudate.  Cardiovascular: Normal rate, regular rhythm and normal heart sounds. Exam reveals no gallop and no friction rub.  No murmur heard.  Pulmonary/Chest: Effort normal and breath sounds normal. No respiratory distress. He has no wheezes.  Abdominal: Soft. Bowel sounds are normal. He exhibits no distension. There is no tenderness.  Lymphadenopathy:  He has no cervical adenopathy.  Neurological: He is alert  and oriented to person, place, and time.  Skin: Skin is warm and dry. No rash noted. No erythema.  Psychiatric: He has a normal mood and affect. His behavior is normal.     Lab Results Recent Labs    03/16/21 0155 03/17/21 0222  WBC 18.6*  18.1* 12.3*  HGB 15.6  15.6 15.1  HCT 45.2  44.3 43.2  NA 131* 132*  K 3.7 3.2*  CL 100 101  CO2 18* 19*  BUN 20 21*  CREATININE 1.32* 1.06   Liver Panel Recent Labs    03/16/21 1451 03/17/21 0222  PROT 7.0 6.7  ALBUMIN 3.1* 2.8*  AST 14* 17  ALT 14 16  ALKPHOS 59 57  BILITOT 0.9 1.0  BILIDIR 0.2  --   IBILI 0.7  --     Microbiology: reviewed Studies/Results: No results found.   Assessment/Plan: Febrile illness =appears improved without any antimicrobials. Recommend to continue to monitor at home for fevers. For now we will follow up on blood cx  Leukocytosis = improved. Getting closer back to his baseline  Abdominal pain = possibly viral etiology. Appears resolved.  No need for follow up.  [facility] for Infectious Diseases Cell: [phone] Pager: [phone]  03/17/2021, 1:34 PM

## 2021-03-19 LAB — CULTURE, BLOOD (ROUTINE X 2)
Culture: NO GROWTH
Culture: NO GROWTH
Special Requests: ADEQUATE
Special Requests: ADEQUATE

## 2021-03-20 ENCOUNTER — Telehealth: Payer: Self-pay

## 2021-03-20 ENCOUNTER — Telehealth: Payer: Self-pay | Admitting: Family Medicine

## 2021-03-20 ENCOUNTER — Ambulatory Visit (INDEPENDENT_AMBULATORY_CARE_PROVIDER_SITE_OTHER): Payer: Self-pay

## 2021-03-20 ENCOUNTER — Other Ambulatory Visit: Payer: Self-pay

## 2021-03-20 DIAGNOSIS — Z5181 Encounter for therapeutic drug level monitoring: Secondary | ICD-10-CM

## 2021-03-20 DIAGNOSIS — Z7901 Long term (current) use of anticoagulants: Secondary | ICD-10-CM

## 2021-03-20 DIAGNOSIS — I513 Intracardiac thrombosis, not elsewhere classified: Secondary | ICD-10-CM

## 2021-03-20 LAB — POCT INR: INR: 2.8 (ref 2.0–3.0)

## 2021-03-20 NOTE — Telephone Encounter (Signed)
   Transition Care Management Follow-up Telephone Call   Patient returned call.  Spanish Interpreter # [id number]/[employer] assisted.    Date of discharge and from where: 03/17/2021, [facility]   How have you been since you were released from the hospital? He said that he no longer has pain in his stomach, he now has pain in his back.  He reports that the pain is in his back, possibly kidney area.  He stated that it hurts when he moves and lays down.  He has been having difficulty sleeping. He did not want to go to ED or UC.  This CM explained the Mobile Medical Unit program.  He said he would go tomorrow, not today.  Provided him with the address and hours of the unit - [facility].  Explained that it is first come first served. He was very appreciative and said plans to go tomorrow but understands when he would need to return to ED.   Any questions or concerns? Yes - noted above.   Items Reviewed:  Did the pt receive and understand the discharge instructions provided? Yes   Medications obtained and verified? Yes  - he said he has all medications and did not have any questions about the med regime.   Other? No   Any new allergies since your discharge? No   Do you have support at home? Yes   Home Care and Equipment/Supplies: Were home health services ordered? no If so, what is the name of the agency? n/a  Has the agency set up a time to come to the patient's home? not applicable Were any new equipment or medical supplies ordered?  No What is the name of the medical supply agency? n/a Were you able to get the supplies/equipment? not applicable Do you have any questions related to the use of the equipment or supplies? No   He has a glucometer.    Functional Questionnaire: (I = Independent and D = Dependent) ADLs: independent  Follow up appointments reviewed:   PCP Hospital f/u appt confirmed? appointment scheduled with Georgian Co, PA 04/27/2021 but  patient is planning to go to the Mobile Medical Unit tomorrow.    Specialist Hospital f/u appt confirmed? Yes  - He had his INR check today   Are transportation arrangements needed? No   If their condition worsens, is the pt aware to call PCP or go to the Emergency Dept.? Yes  Was the patient provided with contact information for the PCP's office or ED? Yes  Was to pt encouraged to call back with questions or concerns? Yes

## 2021-03-20 NOTE — Telephone Encounter (Signed)
From the discharge call:    He said that he no longer has pain in his stomach, he now has pain in his back.  He reports that the pain is in his back, possibly kidney area.  He stated that it hurts when he moves and lays down.  He has been having difficulty sleeping. He did not want to go to ED or UC.  This CM explained the Mobile Medical Unit program.  He said he would go tomorrow, not today.  Provided him with the address and hours of the unit - [facility].  Explained that it is first come first served. He was very appreciative and said plans to go tomorrow but understands when he would need to return to ED.     he said he has all medications and did not have any questions about the med regime.   Appointment scheduled with Georgian Co, PA 04/27/2021 but patient is planning to go to the Mobile Medical Unit tomorrow.    He had his INR check today

## 2021-03-20 NOTE — Patient Instructions (Signed)
Continue taking 1 and 1/2 tablet daily except for 1 Monday ,Wednesday and Friday   Recheck INR in 3 weeks. Call with bleeding problems or medication changes [phone].

## 2021-03-20 NOTE — Telephone Encounter (Signed)
Copied from CRM [id number]. Topic: General - Other >> Mar 17, 2021 10:29 AM Tamela Oddi wrote: Reason for CRM: Patient would like to know if he could be seen sooner than 6/30 for a hospital follow up.  Patient is being d/c today and is on Coumadin and feels he needs an appt. Sooner.  Please call to discuss at [phone]   Called patient using interpreter services advising patient that I was calling from [facility] in regards to scheduling an appointment. Patient can be seen at mobile clinic if wishing to be seen sooner, at this time none of our providers have anything available any sooner than his scheduled appt.   Today (03/20/21) mobile will be at [address] [facility] [phone] from 9-12:15 and 2-5:30.   Tomorrow (03/21/21) mobile will be at [address] [facility] [phone] from 9-12:15 and 2-5:30.   Thursday (03/23/21) mobile will be at [address] [facility] from 9-11:15.

## 2021-03-20 NOTE — Telephone Encounter (Signed)
Transition Care Management Unsuccessful Follow-up Telephone Call  Call placed with assistance of Spanish interpreter # [id number]/[employer]  Date of discharge and from where:  03/17/2021, [facility]   Attempts:  1st Attempt  Reason for unsuccessful TCM follow-up call:  Left voice message on # [phone].  Call placed to # [phone], message left with patient's daughter requesting a call back to this CM.    Patient has appointment for INR check to day at the coumadin clinic.   He has an appointment at [facility] 04/27/2021, Can offer him an appointment at another community care clinic to be seen sooner

## 2021-03-21 ENCOUNTER — Telehealth (HOSPITAL_COMMUNITY): Payer: Self-pay | Admitting: Pharmacist

## 2021-03-21 ENCOUNTER — Other Ambulatory Visit (HOSPITAL_COMMUNITY): Payer: Self-pay

## 2021-03-21 ENCOUNTER — Ambulatory Visit: Payer: Self-pay | Admitting: Physician Assistant

## 2021-03-21 VITALS — BP 124/56 | HR 91 | Temp 98.2°F | Resp 18 | Ht 67.0 in | Wt 165.0 lb

## 2021-03-21 DIAGNOSIS — M545 Low back pain, unspecified: Secondary | ICD-10-CM

## 2021-03-21 LAB — POCT URINALYSIS DIP (CLINITEK)
Bilirubin, UA: NEGATIVE
Blood, UA: NEGATIVE
Glucose, UA: >=1000 mg/dL — AB
Leukocytes, UA: NEGATIVE
Nitrite, UA: NEGATIVE
POC PROTEIN,UA: NEGATIVE
Spec Grav, UA: 1.02
Urobilinogen, UA: 0.2 U/dL
pH, UA: 5

## 2021-03-21 LAB — CULTURE, BLOOD (ROUTINE X 2)
Culture: NO GROWTH
Culture: NO GROWTH
Special Requests: ADEQUATE

## 2021-03-21 NOTE — Patient Instructions (Signed)
Your urine did not show any signs of a urinary tract infection.  If your back pain returns, I encourage you to try Tylenol again.  Please feel free to return to the mobile unit if needed  Roney Jaffe, PA-C Physician Assistant [facility] Mobile Medicine [email or web address]    Dolor de espalda agudo en los adultos Acute Back Pain, Adult El dolor de espalda agudo es repentino y por lo general no dura mucho tiempo. Se debe generalmente a una lesin de los msculos y tejidos de la espalda. La lesin puede ser el resultado de:  Estiramiento en exceso o desgarro (esguince) de un msculo o ligamento. Los ligamentos son tejidos que [employer]. Levantar algo de forma incorrecta puede producir un esguince de espalda.  Desgaste (degeneracin) de los discos vertebrales. Los discos vertebrales son tejidos circulares que proporcionan amortiguacin entre los huesos de la columna vertebral (vrtebras).  Movimientos de giro, como al practicar deportes o realizar trabajos de [place].  Un golpe en la espalda.  Artritis. Es posible Producer, television/film/video un examen fsico, anlisis de laboratorio u otros estudios de diagnstico por imgenes para Veterinary surgeon causa del Engineer, mining. El dolor de espalda agudo generalmente desaparece con reposo y cuidados en la casa. Siga estas instrucciones en su casa: Control del dolor, la rigidez y la hinchazn  El tratamiento puede incluir medicamentos para Chief Technology Officer y la inflamacin que se toman por boca o que se aplican sobre la piel, analgsicos recetados o relajantes musculares. Use los medicamentos de venta libre y los recetados solamente como se lo haya indicado el mdico.  El mdico puede recomendarle que se aplique hielo durante las primeras 24a 48horas despus del comienzo del Engineer, mining. Para hacer esto: ? Ponga el hielo en una bolsa plstica. ? Coloque una [employer] piel y Copy. ? Aplique el hielo durante  , 2 a 3veces por da.  Si se lo indican, aplique calor en la zona afectada con la frecuencia que le haya indicado el mdico. Use la fuente de calor que el mdico le recomiende, como una compresa de calor hmedo o una almohadilla trmica. ? Coloque una [employer] piel y la fuente de Airline pilot. ? Aplique calor durante 20 a . ? Retire la fuente de calor si la piel se pone de color rojo brillante. Esto es especialmente importante si no puede sentir dolor, calor o fro. Corre un mayor riesgo de sufrir quemaduras. Actividad  No permanezca en la cama. Hacer reposo en la cama por ms de 1 a 2 das puede demorar su recuperacin.  Mantenga una buena postura al sentarse y pararse. No se incline hacia adelante al sentarse ni se encorve al pararse. ? Si trabaja en un escritorio, sintese cerca de este para no tener que inclinarse. Mantenga el mentn hacia abajo. Mantenga el cuello hacia atrs y los codos flexionados en un ngulo de 90 grados (ngulo recto). ? Cuando conduzca, sintese elevado y cerca del volante. Agregue un apoyo para la espalda (lumbar) al asiento del automvil, si es necesario.  Realice caminatas cortas en superficies planas tan pronto como le sea posible. Trate de caminar un poco ms de Pharmacist, community.  No se siente, conduzca o permanezca de pie en un mismo lugar durante ms de 30 minutos seguidos. Pararse o sentarse durante largos perodos de Contractor la espalda.  No conduzca ni use maquinaria pesada mientras toma analgsicos recetados.  Use tcnicas apropiadas para levantar objetos. Cuando se inclina y Solicitor un  objeto, utilice posiciones que no sobrecarguen tanto la espalda: ? Flexione las rodillas. ? Mantenga la carga cerca del cuerpo. ? No se tuerza.  Haga actividad fsica habitualmente como se lo haya indicado el mdico. Hacer ejercicios ayuda a que la espalda sane ms rpido y [place] a Automotive engineer las lesiones de la espalda al [employer]  msculos fuertes y flexibles.  Trabaje con un fisioterapeuta para crear un programa de ejercicios seguros, segn lo recomiende el mdico. Haga ejercicios como se lo haya indicado el fisioterapeuta.   Estilo de vida  Mantenga un peso saludable. El sobrepeso sobrecarga la espalda y hace que resulte difcil tener una buena [place].  Evite actividades o situaciones que lo hagan sentirse ansioso o estresado. El estrs y la ansiedad aumentan la tensin muscular y pueden empeorar el dolor de espalda. Aprenda formas de [employer] ansiedad y [place], como a travs del ejercicio. Instrucciones generales  Duerma sobre un colchn firme en una posicin cmoda. Intente acostarse de costado, con las rodillas ligeramente flexionadas. Si se recuesta Fisher [employer], coloque una almohada debajo de las rodillas.  Siga el plan de tratamiento como se lo haya indicado el mdico. Esto puede incluir: ? Terapia cognitiva o conductual. ? Acupuntura o terapia de masajes. ? Yoga o meditacin. Comunquese con un mdico si:  Siente un dolor que no se alivia con reposo o medicamentos.  Siente mucho dolor que se extiende a las piernas o las nalgas.  El dolor no mejora luego de 2 semanas.  Siente dolor por la noche.  Pierde peso sin proponrselo.  Tiene fiebre o escalofros. Solicite ayuda de inmediato si:  Tiene nuevos problemas para controlar la vejiga o los intestinos.  Siente debilidad o adormecimiento inusuales en los brazos o en las piernas.  Siente nuseas o vmitos.  Siente dolor abdominal.  Siente que va a desmayarse. Resumen  El dolor de espalda agudo es repentino y por lo general no dura mucho tiempo.  Use tcnicas apropiadas para levantar objetos. Cuando se inclina y levanta un [place], utilice posiciones que no sobrecarguen tanto la espalda.  Tome los medicamentos de venta libre o recetados y aplique calor o hielo solamente como se lo haya indicado el mdico. Esta informacin no tiene  Theme park manager el consejo del mdico. Asegrese de hacerle al mdico cualquier pregunta que tenga. Document Revised: 08/04/2020 Document Reviewed: 08/04/2020 Elsevier Patient Education  2021 [employer].

## 2021-03-21 NOTE — Progress Notes (Signed)
Patient denies pain at this time. Patient reports intermittent pain in the lower back. Patient has eaten today. Patient has taken medication today.

## 2021-03-21 NOTE — Progress Notes (Signed)
Established Patient Office Visit  Subjective:  Patient ID: Isaac Ray, male    DOB: 1971-07-16  Age: 50 y.o. MRN: [id number]  CC:  Chief Complaint  Patient presents with  . Back Pain    HPI Isaac Ray reports that he has been having lower back pain for the past couple of days, states that it hurts when he moves and lays down, denies injury or trauma, denies radiation, denies numbness or tingling denies dysuria.  States that he took some tylenol and the pain has resolved   Due to language barrier, an interpreter was present during the history-taking and subsequent discussion (and for part of the physical exam) with this patient.    Past Medical History:  Diagnosis Date  . CAD (coronary artery disease)   . Essential hypertension   . Heart murmur    when I was a teenager- no mention of it now  . Hyperlipidemia   . 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 Type 2 diabetes mellitus (HCC)    Type II    Past Surgical History:  Procedure Laterality Date  . AMPUTATION Left 04/08/2019   Procedure: LEFT FOOT 4TH TOE AMPUTATION;  Surgeon: Nadara Mustard, MD;  Location: [facility] OR;  Service: Orthopedics;  Laterality: Left;  . CORONARY ANGIOPLASTY WITH STENT PLACEMENT  2013  . CORONARY ARTERY BYPASS GRAFT N/A 02/16/2015   Procedure: CORONARY ARTERY BYPASS GRAFTING (CABG) x5 using left internal mammary artery, right thigh greater saphenous vein, and left radial artery. ;  Surgeon: [name], MD;  Location: [facility] OR;  Service: Open Heart Surgery;  Laterality: N/A;  . LAPAROSCOPIC APPENDECTOMY N/A 04/25/2019   Procedure: APPENDECTOMY LAPAROSCOPIC;  Surgeon: Sheliah Hatch De Blanch, MD;  Location: [facility] OR;  Service: General;  Laterality: N/A;  . LEFT HEART CATHETERIZATION WITH CORONARY ANGIOGRAM N/A 02/14/2015   Procedure: LEFT HEART CATHETERIZATION WITH CORONARY ANGIOGRAM;  Surgeon: Runell Gess, MD;  Location: [facility] CATH LAB;  Service:  Cardiovascular;  Laterality: N/A;  . LOWER EXTREMITY ANGIOGRAPHY N/A 03/30/2019   Procedure: LOWER EXTREMITY ANGIOGRAPHY;  Surgeon: Runell Gess, MD;  Location: [facility] INVASIVE CV LAB;  Service: Cardiovascular;  Laterality: N/A;  . RADIAL ARTERY HARVEST Left 02/16/2015   Procedure: RADIAL ARTERY HARVEST;  Surgeon: [name], MD;  Location: [facility] OR;  Service: Open Heart Surgery;  Laterality: Left;  . TEE WITHOUT CARDIOVERSION N/A 02/16/2015   Procedure: TRANSESOPHAGEAL ECHOCARDIOGRAM (TEE);  Surgeon: [name], MD;  Location: [facility] OR;  Service: Open Heart Surgery;  Laterality: N/A;    Family History  Problem Relation Age of Onset  . Coronary artery disease Father        had CABG  . Heart disease Father     Social History   Socioeconomic History  . Marital status: Single    Spouse name: Not on file  . Number of children: Not on file  . Years of education: Not on file  . Highest education level: Not on file  Occupational History  . Not on file  Tobacco Use  . Smoking status: Former Smoker    Years: 30.00    Quit date: 02/16/2015    Years since quitting: 6.0  . Smokeless tobacco: Never Used  Vaping Use  . Vaping Use: Never used  Substance and Sexual Activity  . Alcohol use: No    Alcohol/week: 0.0 standard drinks  . Drug use: No  . Sexual activity: Not Currently  Other Topics Concern  .  Not on file  Social History Narrative  . Not on file   Social Determinants of Health   Financial Resource Strain: Not on file  Food Insecurity: Not on file  Transportation Needs: Not on file  Physical Activity: Not on file  Stress: Not on file  Social Connections: Not on file  Intimate Partner Violence: Not on file    Outpatient Medications Prior to Visit  Medication Sig Dispense Refill  . acetaminophen (TYLENOL) 325 MG tablet Take 2 tablets (650 mg total) by mouth every 6 (six) hours as needed for mild pain (or temp > 100).    Marland Kitchen atorvastatin (LIPITOR) 80 MG tablet  Take 1 tablet (80 mg total) by mouth daily. 30 tablet 0  . carvedilol (COREG) 3.125 MG tablet Take 1 tablet (3.125 mg total) by mouth 2 (two) times daily with a meal. 60 tablet 0  . clopidogrel (PLAVIX) 75 MG tablet Take 1 tablet (75 mg total) by mouth daily with breakfast. 30 tablet 0  . empagliflozin (JARDIANCE) 10 MG TABS tablet Take 1 tablet (10 mg total) by mouth daily before breakfast. 30 tablet 0  . glipiZIDE (GLUCOTROL XL) 2.5 MG 24 hr tablet Take 1 tablet (2.5 mg total) by mouth daily with breakfast. 30 tablet 0  . metFORMIN (GLUCOPHAGE) 1000 MG tablet Take 1 tablet (1,000 mg total) by mouth 2 (two) times daily with a meal. 60 tablet 0  . sacubitril-valsartan (ENTRESTO) 24-26 MG TAKE 1 TABLET BY MOUTH 2 (TWO) TIMES DAILY. 60 tablet 0  . spironolactone (ALDACTONE) 25 MG tablet Take 0.5 tablets (12.5 mg total) by mouth daily. 30 tablet 0  . warfarin (COUMADIN) 5 MG tablet Take 1.5 tablets  7.5 mg Monday,Wednesday and Friday. 5 mg  Tuesday,Thursday,Saturday and sunday 60 tablet 0   No facility-administered medications prior to visit.    No Known Allergies  ROS Review of Systems  Constitutional: Negative.   HENT: Negative.   Eyes: Negative.   Respiratory: Negative for shortness of breath.   Cardiovascular: Negative for chest pain.  Gastrointestinal: Negative.   Endocrine: Negative.   Genitourinary: Negative for dysuria, flank pain and hematuria.  Musculoskeletal: Negative for back pain.  Skin: Negative.   Allergic/Immunologic: Negative.   Neurological: Negative for headaches.  Hematological: Negative.   Psychiatric/Behavioral: Negative.       Objective:    Physical Exam Vitals and nursing note reviewed.  Constitutional:      Appearance: Normal appearance.  HENT:     Head: Normocephalic and atraumatic.     Right Ear: External ear normal.     Left Ear: External ear normal.     Nose: Nose normal.     Mouth/Throat:     Mouth: Mucous membranes are moist.     Pharynx:  Oropharynx is clear.  Eyes:     Extraocular Movements: Extraocular movements intact.     Conjunctiva/sclera: Conjunctivae normal.     Pupils: Pupils are equal, round, and reactive to light.  Cardiovascular:     Rate and Rhythm: Normal rate and regular rhythm.     Pulses: Normal pulses.     Heart sounds: Normal heart sounds.  Pulmonary:     Effort: Pulmonary effort is normal.     Breath sounds: Normal breath sounds.  Musculoskeletal:        General: Normal range of motion.     Cervical back: Normal range of motion and neck supple.  Skin:    General: Skin is warm and dry.  Neurological:  General: No focal deficit present.     Mental Status: He is alert and oriented to person, place, and time.  Psychiatric:        Mood and Affect: Mood normal.        Behavior: Behavior normal.        Thought Content: Thought content normal.     BP (!) 124/56 (BP Location: Left Arm, Patient Position: Sitting, Cuff Size: Normal)   Pulse 91   Temp 98.2 F (36.8 C) (Oral)   Resp 18   Ht 5\' 7"  (1.702 m)   Wt 165 lb (74.8 kg)   SpO2 100%   BMI 25.84 kg/m  Wt Readings from Last 3 Encounters:  03/21/21 165 lb (74.8 kg)  03/17/21 159 lb 6.3 oz (72.3 kg)  05/18/20 174 lb 12.8 oz (79.3 kg)     Health Maintenance Due  Topic Date Due  . OPHTHALMOLOGY EXAM  Never done  . Hepatitis C Screening  Never done  . COLONOSCOPY (Pts 45-48yrs Insurance coverage will need to be confirmed)  Never done  . FOOT EXAM  02/28/2018    There are no preventive care reminders to display for this patient.  Lab Results  Component Value Date   TSH 1.956 02/12/2015   Lab Results  Component Value Date   WBC 12.3 (H) 03/17/2021   HGB 15.1 03/17/2021   HCT 43.2 03/17/2021   MCV 89.8 03/17/2021   PLT 210 03/17/2021   Lab Results  Component Value Date   NA 132 (L) 03/17/2021   K 3.2 (L) 03/17/2021   CO2 19 (L) 03/17/2021   GLUCOSE 140 (H) 03/17/2021   BUN 21 (H) 03/17/2021   CREATININE 1.06 03/17/2021    BILITOT 1.0 03/17/2021   ALKPHOS 57 03/17/2021   AST 17 03/17/2021   ALT 16 03/17/2021   PROT 6.7 03/17/2021   ALBUMIN 2.8 (L) 03/17/2021   CALCIUM 8.5 (L) 03/17/2021   ANIONGAP 12 03/17/2021   Lab Results  Component Value Date   CHOL 109 05/18/2020   Lab Results  Component Value Date   HDL 28 (L) 05/18/2020   Lab Results  Component Value Date   LDLCALC 60 05/18/2020   Lab Results  Component Value Date   TRIG 110 05/18/2020   Lab Results  Component Value Date   CHOLHDL 4.0 07/17/2019   Lab Results  Component Value Date   HGBA1C 9.3 (H) 03/15/2021      Assessment & Plan:   Problem List Items Addressed This Visit   None   Visit Diagnoses    Acute right-sided low back pain without sciatica    -  Primary   Relevant Orders   POCT URINALYSIS DIP (CLINITEK) (Completed)    1. Acute right-sided low back pain without sciatica UA negative, pain currently resolved.  Patient education given on preventative care for back pain.  Red flags given for prompt reevaluation - POCT URINALYSIS DIP (CLINITEK)    I have reviewed the patient's medical history (PMH, PSH, Social History, Family History, Medications, and allergies) , and have been updated if relevant. I spent 12 minutes reviewing chart and  face to face time with patient.     No orders of the defined types were placed in this encounter.   Follow-up: Return if symptoms worsen or fail to improve.    03/17/2021 [place], PA-C

## 2021-03-22 ENCOUNTER — Other Ambulatory Visit: Payer: Self-pay

## 2021-04-24 ENCOUNTER — Telehealth: Payer: Self-pay

## 2021-04-24 NOTE — Telephone Encounter (Signed)
Lmom for overdue inr 

## 2021-04-26 NOTE — Progress Notes (Signed)
Patient ID: Isaac Ray, male   DOB: 12-29-1970, 50 y.o.   MRN: [id number] Virtual Visit via Telephone Note  I connected with Isaac Ray on 04/27/21 at  8:50 AM EDT by telephone and verified that I am speaking with the correct person using two identifiers.  Location: Patient: home Provider: [facility] office   I discussed the limitations, risks, security and privacy concerns of performing an evaluation and management service by telephone and the availability of in person appointments. I also discussed with the patient that there may be a patient responsible charge related to this service. The patient expressed understanding and agreed to proceed.   History of Present Illness: He is doing well.  No CP.  Per the chart his last INR was due 6/27 and he missed that appt.  He is compliant on meds.  Plavix and coumadin are both in his med list but he is only taking coumadin.  He denies any CP/SOB.  Needs RF on all meds.   Blood sugars have been running mostly 110-150, but he has had a few in the low 200s depending on what he eats.    Admit Date: 03/13/2021 Discharge date: 03/17/2021   Recommendations for Outpatient Follow-up:  Follow up with PCP in 1-2 weeks Please obtain CMP/CBC in one week Please ensure follow-up with the Coumadin clinic. Please follow blood cultures drawn on 5/17, 5/19 until final.  Brief Narrative: Patient is a 50 y.o. 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  He recently spent 1 month in [place]-and returned back to the [place] on 5/15.  Abdominal pain resolved with supportive care-CT imaging was negative for acute abnormalities.  Hospital course was then complicated by development of leukocytosis and fever.  See below for further details.   Brief Hospital Course: Abdominal pain: Unclear etiology-has COMPLETELY resolved as of  5/17.  Doubt any further work-up required given complete resolution of the pain and benign exam.   Fever with leukocytosis:  Unclear etiology-blood cultures x2 and COVID PCR x2 was negative.  CT chest/abdomen was negative for any acute abnormalities.  Infectious disease was consulted-since work-up was negative-leukocytosis has down trended significantly-fever curve is better-suspicion that he may have had a viral syndrome.  Discussed with infectious disease MD-Dr. Doristine Church to discharge-blood cultures will be followed closely-if positive-he will be called back to the hospital.  Discussed this plan with the patient-he is agreeable-and anxious to go home.    AKI: Mild-hold Lasix-volume status stable-follow.   Acute hypoxic respiratory failure due to HFrEF with mild exacerbation: Hypoxia resolved-on room air.  Holding Lasix due to AKI.   HFrEF with mild exacerbation: Remains euvolemic-initially required IV Lasix-stable to be discharged back on his usual regimen of Aldactone/Entresto/Coreg   CAD s/p CABG: No anginal symptoms-continue Plavix/statin/Coreg   History of PAD: Continue antiplatelet/statin.   History of LV thrombus: Noncompliant to Coumadin-claims he stopped taking for approximately 2 weeks when he was in [place] city.  Repeat echo continues to show what looks like a thrombus in the LV apex.  Managed with IV heparin-and overlapping Coumadin-INR 1.9 on the day of discharge.  Will place back on his usual home Coumadin regimen-I have asked case management-and also have asked his daughter (spoke on the phone) to see if we can get him a appointment at the Coumadin clinic this coming Monday.   DM-2: CBG stable-resume oral hypoglycemic agents on discharge.  Follow with primary care practitioner for further optimization.     Discharge Diagnoses:  Principal Problem:   Acute respiratory failure with hypoxia (HCC) Active Problems:   S/P CABG x 5   PAOD (peripheral arterial occlusive disease)  (HCC)   Type 2 diabetes mellitus (HCC)   Ischemic cardiomyopathy   LV (left ventricular) mural thrombus   Abdominal pain   Observations/Objective: NAD.  A&Ox3   Assessment and Plan: 1. History of non-ST elevation myocardial infarction (NSTEMI) Followed by cardiology - carvedilol (COREG) 3.125 MG tablet; Take 1 tablet (3.125 mg total) by mouth 2 (two) times daily with a meal.  Dispense: 60 tablet; Refill: 2 - spironolactone (ALDACTONE) 25 MG tablet; Take 0.5 tablets (12.5 mg total) by mouth daily.  Dispense: 30 tablet; Refill: 2 - warfarin (COUMADIN) 5 MG tablet; Take 1.5 tablets  7.5 mg Monday,Wednesday and Friday. 5 mg  Tuesday,Thursday,Saturday and sunday  Dispense: 60 tablet; Refill: 1  2. Sinus bradycardia - carvedilol (COREG) 3.125 MG tablet; Take 1 tablet (3.125 mg total) by mouth 2 (two) times daily with a meal.  Dispense: 60 tablet; Refill: 2  3. Type 2 diabetes mellitus with other circulatory complication, without long-term current use of insulin (HCC) Improving.  Eliminate sugar from diet;  continue meds and check glucose as directed - Comprehensive metabolic panel; Future - CBC with Differential/Platelet; Future - metFORMIN (GLUCOPHAGE) 1000 MG tablet; Take 1 tablet (1,000 mg total) by mouth 2 (two) times daily with a meal.  Dispense: 60 tablet; Refill: 2 - glipiZIDE (GLUCOTROL XL) 2.5 MG 24 hr tablet; Take 1 tablet (2.5 mg total) by mouth daily with breakfast.  Dispense: 30 tablet; Refill: 2 - empagliflozin (JARDIANCE) 10 MG TABS tablet; Take 1 tablet (10 mg total) by mouth daily before breakfast.  Dispense: 30 tablet; Refill: 2  4. S/P CABG x 5 Followed by cardiology - spironolactone (ALDACTONE) 25 MG tablet; Take 0.5 tablets (12.5 mg total) by mouth daily.  Dispense: 30 tablet; Refill: 2 - warfarin (COUMADIN) 5 MG tablet; Take 1.5 tablets  7.5 mg Monday,Wednesday and Friday. 5 mg  Tuesday,Thursday,Saturday and sunday  Dispense: 60 tablet; Refill: 1  5. Essential  hypertension - sacubitril-valsartan (ENTRESTO) 24-26 MG; TAKE 1 TABLET BY MOUTH 2 (TWO) TIMES DAILY.  Dispense: 60 tablet; Refill: 2  6. Mixed hyperlipidemia - atorvastatin (LIPITOR) 80 MG tablet; Take 1 tablet (80 mg total) by mouth daily.  Dispense: 90 tablet; Refill: 1  7. Hospital discharge follow-up   8. LV (left ventricular) mural thrombus Continue coumadin-advised that he missed last INR appt and needs to have this done  9. Language barrier [place]interpreters used and additional time performing visit was required.    Follow Up Instructions: Please ensure he has an appt to have INR followed-either here with Franky Macho or with cardiology.  Make appt with his PCP in 2 months for chronic conditions.     I discussed the assessment and treatment plan with the patient. The patient was provided an opportunity to ask questions and all were answered. The patient agreed with the plan and demonstrated an understanding of the instructions.   The patient was advised to call back or seek an in-person evaluation if the symptoms worsen or if the condition fails to improve as anticipated.  I provided 21 minutes of non-face-to-face time during this encounter.   Georgian Co, PA-C

## 2021-04-27 ENCOUNTER — Encounter: Payer: Self-pay | Admitting: Physician Assistant

## 2021-04-27 ENCOUNTER — Other Ambulatory Visit: Payer: Self-pay

## 2021-04-27 ENCOUNTER — Ambulatory Visit: Payer: Self-pay | Attending: Physician Assistant | Admitting: Physician Assistant

## 2021-04-27 DIAGNOSIS — E1159 Type 2 diabetes mellitus with other circulatory complications: Secondary | ICD-10-CM

## 2021-04-27 DIAGNOSIS — I513 Intracardiac thrombosis, not elsewhere classified: Secondary | ICD-10-CM

## 2021-04-27 DIAGNOSIS — Z951 Presence of aortocoronary bypass graft: Secondary | ICD-10-CM

## 2021-04-27 DIAGNOSIS — Z789 Other specified health status: Secondary | ICD-10-CM

## 2021-04-27 DIAGNOSIS — E782 Mixed hyperlipidemia: Secondary | ICD-10-CM

## 2021-04-27 DIAGNOSIS — R001 Bradycardia, unspecified: Secondary | ICD-10-CM

## 2021-04-27 DIAGNOSIS — I252 Old myocardial infarction: Secondary | ICD-10-CM

## 2021-04-27 DIAGNOSIS — I10 Essential (primary) hypertension: Secondary | ICD-10-CM

## 2021-04-27 DIAGNOSIS — Z09 Encounter for follow-up examination after completed treatment for conditions other than malignant neoplasm: Secondary | ICD-10-CM

## 2021-04-27 MED ORDER — EMPAGLIFLOZIN 10 MG PO TABS
10.0000 mg | ORAL_TABLET | Freq: Every day | ORAL | 2 refills | Status: AC
  Filled 2021-04-27: qty 30, 30d supply, fill #0
  Filled 2021-06-20: qty 30, 30d supply, fill #1
  Filled 2021-09-19: qty 30, 30d supply, fill #2

## 2021-04-27 MED ORDER — ATORVASTATIN CALCIUM 80 MG PO TABS
80.0000 mg | ORAL_TABLET | Freq: Every day | ORAL | 1 refills | Status: DC
  Filled 2021-04-27: qty 30, 30d supply, fill #0
  Filled 2021-06-20: qty 30, 30d supply, fill #1
  Filled 2021-09-19: qty 30, 30d supply, fill #2
  Filled 2021-11-27: qty 30, 30d supply, fill #0

## 2021-04-27 MED ORDER — METFORMIN HCL 1000 MG PO TABS
1000.0000 mg | ORAL_TABLET | Freq: Two times a day (BID) | ORAL | 2 refills | Status: DC
  Filled 2021-04-27: qty 60, 30d supply, fill #0
  Filled 2021-06-20: qty 60, 30d supply, fill #1
  Filled 2021-09-19: qty 60, 30d supply, fill #2

## 2021-04-27 MED ORDER — GLIPIZIDE ER 2.5 MG PO TB24
2.5000 mg | ORAL_TABLET | Freq: Every day | ORAL | 2 refills | Status: DC
  Filled 2021-04-27: qty 30, 30d supply, fill #0
  Filled 2021-06-20: qty 30, 30d supply, fill #1
  Filled 2021-09-19: qty 30, 30d supply, fill #2

## 2021-04-27 MED ORDER — WARFARIN SODIUM 5 MG PO TABS
7.5000 mg | ORAL_TABLET | ORAL | 1 refills | Status: DC
  Filled 2021-04-27: qty 60, 30d supply, fill #0
  Filled 2021-06-20: qty 60, 30d supply, fill #1

## 2021-04-27 MED ORDER — SACUBITRIL-VALSARTAN 24-26 MG PO TABS
1.0000 | ORAL_TABLET | Freq: Two times a day (BID) | ORAL | 2 refills | Status: AC
  Filled 2021-04-27: qty 60, 30d supply, fill #0
  Filled 2021-06-20: qty 60, 30d supply, fill #1
  Filled 2021-09-19: qty 60, 30d supply, fill #2

## 2021-04-27 MED ORDER — CARVEDILOL 3.125 MG PO TABS
3.1250 mg | ORAL_TABLET | Freq: Two times a day (BID) | ORAL | 2 refills | Status: DC
  Filled 2021-04-27: qty 60, 30d supply, fill #0
  Filled 2021-06-20: qty 60, 30d supply, fill #1
  Filled 2021-09-19: qty 60, 30d supply, fill #2

## 2021-04-27 MED ORDER — SPIRONOLACTONE 25 MG PO TABS
12.5000 mg | ORAL_TABLET | Freq: Every day | ORAL | 2 refills | Status: DC
  Filled 2021-04-27: qty 15, 30d supply, fill #0
  Filled 2021-06-20: qty 15, 30d supply, fill #1
  Filled 2021-09-19: qty 15, 30d supply, fill #2
  Filled 2021-11-27: qty 15, 30d supply, fill #0

## 2021-04-28 ENCOUNTER — Other Ambulatory Visit: Payer: Self-pay

## 2021-04-28 NOTE — Addendum Note (Signed)
Addended byMemory Dance on: 04/28/2021 09:11 AM   Modules accepted: Orders

## 2021-04-29 LAB — CBC WITH DIFFERENTIAL/PLATELET
Basophils Absolute: 0 10*3/uL (ref 0.0–0.2)
Basos: 1 %
EOS (ABSOLUTE): 0.2 10*3/uL (ref 0.0–0.4)
Eos: 3 %
Hematocrit: 43.6 % (ref 37.5–51.0)
Hemoglobin: 14.4 g/dL (ref 13.0–17.7)
Immature Grans (Abs): 0 10*3/uL (ref 0.0–0.1)
Immature Granulocytes: 0 %
Lymphocytes Absolute: 3 10*3/uL (ref 0.7–3.1)
Lymphs: 42 %
MCH: 30.8 pg (ref 26.6–33.0)
MCHC: 33 g/dL (ref 31.5–35.7)
MCV: 93 fL (ref 79–97)
Monocytes Absolute: 0.5 10*3/uL (ref 0.1–0.9)
Monocytes: 7 %
Neutrophils Absolute: 3.3 10*3/uL (ref 1.4–7.0)
Neutrophils: 47 %
Platelets: 188 10*3/uL (ref 150–450)
RBC: 4.67 x10E6/uL (ref 4.14–5.80)
RDW: 12.8 % (ref 11.6–15.4)
WBC: 7.1 10*3/uL (ref 3.4–10.8)

## 2021-04-29 LAB — COMPREHENSIVE METABOLIC PANEL
ALT: 15 IU/L (ref 0–44)
AST: 14 IU/L (ref 0–40)
Albumin/Globulin Ratio: 1.7 (ref 1.2–2.2)
Albumin: 4.3 g/dL (ref 3.8–4.9)
Alkaline Phosphatase: 73 IU/L (ref 44–121)
BUN/Creatinine Ratio: 10 (ref 9–20)
BUN: 9 mg/dL (ref 6–24)
Bilirubin Total: 0.4 mg/dL (ref 0.0–1.2)
CO2: 20 mmol/L (ref 20–29)
Calcium: 9.1 mg/dL (ref 8.7–10.2)
Chloride: 102 mmol/L (ref 96–106)
Creatinine, Ser: 0.89 mg/dL (ref 0.76–1.27)
Globulin, Total: 2.5 g/dL (ref 1.5–4.5)
Glucose: 257 mg/dL — ABNORMAL HIGH (ref 65–99)
Potassium: 4.4 mmol/L (ref 3.5–5.2)
Sodium: 139 mmol/L (ref 134–144)
Total Protein: 6.8 g/dL (ref 6.0–8.5)
eGFR: 104 mL/min/{1.73_m2} (ref >59)

## 2021-05-02 ENCOUNTER — Other Ambulatory Visit: Payer: Self-pay

## 2021-05-04 ENCOUNTER — Encounter: Payer: Self-pay | Admitting: *Deleted

## 2021-05-04 ENCOUNTER — Other Ambulatory Visit: Payer: Self-pay

## 2021-05-04 ENCOUNTER — Ambulatory Visit: Payer: Self-pay | Attending: Family Medicine | Admitting: Pharmacist

## 2021-05-04 DIAGNOSIS — I513 Intracardiac thrombosis, not elsewhere classified: Secondary | ICD-10-CM

## 2021-05-04 DIAGNOSIS — Z7901 Long term (current) use of anticoagulants: Secondary | ICD-10-CM

## 2021-05-04 LAB — POCT INR: INR: 3.2 — AB (ref 2.0–3.0)

## 2021-06-09 ENCOUNTER — Telehealth: Payer: Self-pay

## 2021-06-09 NOTE — Telephone Encounter (Signed)
Lmom for inr and used interpreter services

## 2021-06-20 ENCOUNTER — Other Ambulatory Visit: Payer: Self-pay

## 2021-06-22 ENCOUNTER — Other Ambulatory Visit: Payer: Self-pay

## 2021-07-25 ENCOUNTER — Ambulatory Visit: Payer: Self-pay | Admitting: Family Medicine

## 2021-08-01 ENCOUNTER — Telehealth: Payer: Self-pay

## 2021-08-01 NOTE — Telephone Encounter (Signed)
Overdue for INR check, called pt but unable to leave message.

## 2021-08-15 ENCOUNTER — Telehealth: Payer: Self-pay | Admitting: *Deleted

## 2021-08-15 ENCOUNTER — Ambulatory Visit: Payer: Self-pay | Admitting: Cardiovascular Disease

## 2021-08-15 NOTE — Telephone Encounter (Signed)
Pt missed appointment today to have INR checked. Called pt and LMOM.

## 2021-08-31 ENCOUNTER — Telehealth: Payer: Self-pay

## 2021-08-31 NOTE — Telephone Encounter (Signed)
Lpm to call and schedule INR check. °

## 2021-09-12 ENCOUNTER — Telehealth: Payer: Self-pay | Admitting: *Deleted

## 2021-09-12 NOTE — Telephone Encounter (Signed)
Pt missed his INR check yesterday. Attempted to get in touch with patient to reschedule appointment. No answer.

## 2021-09-19 ENCOUNTER — Other Ambulatory Visit: Payer: Self-pay

## 2021-09-19 ENCOUNTER — Ambulatory Visit (INDEPENDENT_AMBULATORY_CARE_PROVIDER_SITE_OTHER): Payer: Self-pay | Admitting: Pharmacist Clinician (PhC)/ Clinical Pharmacy Specialist

## 2021-09-19 DIAGNOSIS — I513 Intracardiac thrombosis, not elsewhere classified: Secondary | ICD-10-CM

## 2021-09-19 DIAGNOSIS — I252 Old myocardial infarction: Secondary | ICD-10-CM

## 2021-09-19 DIAGNOSIS — Z951 Presence of aortocoronary bypass graft: Secondary | ICD-10-CM

## 2021-09-19 DIAGNOSIS — Z7901 Long term (current) use of anticoagulants: Secondary | ICD-10-CM

## 2021-09-19 LAB — POCT INR: INR: 2.1 (ref 2.0–3.0)

## 2021-09-19 MED ORDER — WARFARIN SODIUM 5 MG PO TABS
ORAL_TABLET | ORAL | 0 refills | Status: DC
  Filled 2021-09-19: qty 45, 30d supply, fill #0

## 2021-09-20 ENCOUNTER — Other Ambulatory Visit: Payer: Self-pay

## 2021-09-29 ENCOUNTER — Telehealth: Payer: Self-pay

## 2021-09-29 NOTE — Telephone Encounter (Signed)
Lpmtcb and rescheduled Coumadin appointment

## 2021-10-19 ENCOUNTER — Telehealth: Payer: Self-pay

## 2021-10-19 NOTE — Telephone Encounter (Signed)
Lpmtcb and reschedule INR check 

## 2021-11-10 ENCOUNTER — Telehealth: Payer: Self-pay

## 2021-11-10 NOTE — Telephone Encounter (Signed)
I tried calling pt's daughter to schedule INR check, mailbox full.

## 2021-11-20 ENCOUNTER — Telehealth: Payer: Self-pay

## 2021-11-20 NOTE — Telephone Encounter (Signed)
Lp's daughter message to call and schedule pt's INR check

## 2021-11-27 ENCOUNTER — Ambulatory Visit (INDEPENDENT_AMBULATORY_CARE_PROVIDER_SITE_OTHER): Payer: Self-pay

## 2021-11-27 ENCOUNTER — Other Ambulatory Visit: Payer: Self-pay

## 2021-11-27 DIAGNOSIS — Z7901 Long term (current) use of anticoagulants: Secondary | ICD-10-CM

## 2021-11-27 DIAGNOSIS — I513 Intracardiac thrombosis, not elsewhere classified: Secondary | ICD-10-CM

## 2021-11-27 DIAGNOSIS — Z951 Presence of aortocoronary bypass graft: Secondary | ICD-10-CM

## 2021-11-27 DIAGNOSIS — I252 Old myocardial infarction: Secondary | ICD-10-CM

## 2021-11-27 DIAGNOSIS — Z5181 Encounter for therapeutic drug level monitoring: Secondary | ICD-10-CM

## 2021-11-27 LAB — POCT INR: INR: 4 — AB (ref 2.0–3.0)

## 2021-11-27 MED ORDER — WARFARIN SODIUM 5 MG PO TABS
ORAL_TABLET | ORAL | 0 refills | Status: DC
  Filled 2021-11-27: qty 45, 30d supply, fill #0

## 2021-11-27 NOTE — Patient Instructions (Signed)
HOLD Tuesday and then Continue taking 1.5  tablets daily except for 1 Monday Wednesday and Friday   Recheck INR in 4 weeks. Call with bleeding problems or medication changes [phone]. Hessie Diener # [phone]

## 2021-11-30 ENCOUNTER — Other Ambulatory Visit: Payer: Self-pay

## 2021-12-29 ENCOUNTER — Telehealth: Payer: Self-pay

## 2021-12-29 NOTE — Telephone Encounter (Signed)
Spoke to pt's daughter and scheduled INR check with Dr Allyson Sabal visit 3/21. ?

## 2022-01-16 ENCOUNTER — Ambulatory Visit: Payer: Self-pay | Admitting: Cardiovascular Disease

## 2022-01-17 ENCOUNTER — Telehealth: Payer: Self-pay

## 2022-01-17 NOTE — Telephone Encounter (Signed)
I tried reaching pt's daughter Dorann Lodge) to reschedule pt's INR, but mailbox is full. ?

## 2022-02-26 ENCOUNTER — Telehealth: Payer: Self-pay

## 2022-02-26 NOTE — Telephone Encounter (Signed)
Tried to call patient to come in for INR check.  Mailbox full ?

## 2022-03-15 ENCOUNTER — Telehealth: Payer: Self-pay

## 2022-03-15 NOTE — Telephone Encounter (Signed)
Mailbox full 5/18

## 2022-04-16 ENCOUNTER — Telehealth: Payer: Self-pay

## 2022-04-16 NOTE — Telephone Encounter (Signed)
Tried calling patient to schedule INR appointment.  Mailbox full.

## 2022-07-12 ENCOUNTER — Telehealth: Payer: Self-pay | Admitting: *Deleted

## 2022-07-12 NOTE — Telephone Encounter (Signed)
Called pt since he is overdue for INR monitoring, used Education officer, community since primary language is Spanish. Spoke with Interpreter [phone] and he had to leave a message regarding calling the Anticoagulation[facility]Warfarin Clinic back to schedule an appt by calling [phone]. Will await for the pt to call back.

## 2024-10-23 ENCOUNTER — Emergency Department (HOSPITAL_COMMUNITY): Payer: MEDICAID

## 2024-10-23 ENCOUNTER — Inpatient Hospital Stay (HOSPITAL_COMMUNITY)
Admission: EM | Admit: 2024-10-23 | Discharge: 2024-10-27 | DRG: 065 | Disposition: A | Discharge status: Home / Self Care | Payer: MEDICAID | Attending: Internal Medicine | Admitting: Internal Medicine

## 2024-10-23 ENCOUNTER — Encounter (HOSPITAL_COMMUNITY): Payer: Self-pay

## 2024-10-23 ENCOUNTER — Other Ambulatory Visit: Payer: Self-pay

## 2024-10-23 DIAGNOSIS — I5020 Unspecified systolic (congestive) heart failure: Secondary | ICD-10-CM

## 2024-10-23 DIAGNOSIS — I5022 Chronic systolic (congestive) heart failure: Secondary | ICD-10-CM | POA: Diagnosis present

## 2024-10-23 DIAGNOSIS — I2510 Atherosclerotic heart disease of native coronary artery without angina pectoris: Secondary | ICD-10-CM | POA: Diagnosis present

## 2024-10-23 DIAGNOSIS — E1159 Type 2 diabetes mellitus with other circulatory complications: Secondary | ICD-10-CM

## 2024-10-23 DIAGNOSIS — I639 Cerebral infarction, unspecified: Secondary | ICD-10-CM

## 2024-10-23 DIAGNOSIS — Z7901 Long term (current) use of anticoagulants: Secondary | ICD-10-CM

## 2024-10-23 DIAGNOSIS — Z79899 Other long term (current) drug therapy: Secondary | ICD-10-CM

## 2024-10-23 DIAGNOSIS — N179 Acute kidney failure, unspecified: Secondary | ICD-10-CM | POA: Diagnosis present

## 2024-10-23 DIAGNOSIS — R29702 NIHSS score 2: Secondary | ICD-10-CM

## 2024-10-23 DIAGNOSIS — Z91128 Patient's intentional underdosing of medication regimen for other reason: Secondary | ICD-10-CM

## 2024-10-23 DIAGNOSIS — I110 Hypertensive heart disease with heart failure: Secondary | ICD-10-CM | POA: Diagnosis present

## 2024-10-23 DIAGNOSIS — Z5971 Insufficient health insurance coverage: Secondary | ICD-10-CM

## 2024-10-23 DIAGNOSIS — Z603 Acculturation difficulty: Secondary | ICD-10-CM | POA: Diagnosis present

## 2024-10-23 DIAGNOSIS — Z8249 Family history of ischemic heart disease and other diseases of the circulatory system: Secondary | ICD-10-CM

## 2024-10-23 DIAGNOSIS — I255 Ischemic cardiomyopathy: Secondary | ICD-10-CM | POA: Diagnosis present

## 2024-10-23 DIAGNOSIS — Z955 Presence of coronary angioplasty implant and graft: Secondary | ICD-10-CM

## 2024-10-23 DIAGNOSIS — G8194 Hemiplegia, unspecified affecting left nondominant side: Secondary | ICD-10-CM | POA: Diagnosis present

## 2024-10-23 DIAGNOSIS — R29810 Facial weakness: Principal | ICD-10-CM | POA: Diagnosis present

## 2024-10-23 DIAGNOSIS — T45516A Underdosing of anticoagulants, initial encounter: Secondary | ICD-10-CM | POA: Diagnosis present

## 2024-10-23 DIAGNOSIS — Z7984 Long term (current) use of oral hypoglycemic drugs: Secondary | ICD-10-CM

## 2024-10-23 DIAGNOSIS — I6350 Cerebral infarction due to unspecified occlusion or stenosis of unspecified cerebral artery: Secondary | ICD-10-CM

## 2024-10-23 DIAGNOSIS — Z951 Presence of aortocoronary bypass graft: Secondary | ICD-10-CM

## 2024-10-23 DIAGNOSIS — E785 Hyperlipidemia, unspecified: Secondary | ICD-10-CM | POA: Diagnosis present

## 2024-10-23 DIAGNOSIS — E782 Mixed hyperlipidemia: Secondary | ICD-10-CM

## 2024-10-23 DIAGNOSIS — E1165 Type 2 diabetes mellitus with hyperglycemia: Secondary | ICD-10-CM | POA: Diagnosis present

## 2024-10-23 DIAGNOSIS — Z87891 Personal history of nicotine dependence: Secondary | ICD-10-CM

## 2024-10-23 DIAGNOSIS — I6349 Cerebral infarction due to embolism of other cerebral artery: Principal | ICD-10-CM | POA: Diagnosis present

## 2024-10-23 DIAGNOSIS — Z23 Encounter for immunization: Secondary | ICD-10-CM

## 2024-10-23 DIAGNOSIS — R001 Bradycardia, unspecified: Secondary | ICD-10-CM | POA: Diagnosis present

## 2024-10-23 DIAGNOSIS — R29703 NIHSS score 3: Secondary | ICD-10-CM | POA: Diagnosis present

## 2024-10-23 HISTORY — DX: Cerebral infarction, unspecified: I63.9

## 2024-10-23 LAB — APTT: aPTT: 29 s (ref 24–36)

## 2024-10-23 LAB — CBC
HCT: 47.7 % (ref 39.0–52.0)
Hemoglobin: 16.4 g/dL (ref 13.0–17.0)
MCH: 31.7 pg (ref 26.0–34.0)
MCHC: 34.4 g/dL (ref 30.0–36.0)
MCV: 92.1 fL (ref 80.0–100.0)
Platelets: 225 K/uL (ref 150–400)
RBC: 5.18 MIL/uL (ref 4.22–5.81)
RDW: 12.9 % (ref 11.5–15.5)
WBC: 10.1 K/uL (ref 4.0–10.5)
nRBC: 0 % (ref 0.0–0.2)

## 2024-10-23 LAB — I-STAT CHEM 8, ED
BUN: 22 mg/dL — ABNORMAL HIGH (ref 6–20)
Calcium, Ion: 1.14 mmol/L — ABNORMAL LOW (ref 1.15–1.40)
Chloride: 105 mmol/L (ref 98–111)
Creatinine, Ser: 1.5 mg/dL — ABNORMAL HIGH (ref 0.61–1.24)
Glucose, Bld: 147 mg/dL — ABNORMAL HIGH (ref 70–99)
HCT: 48 % (ref 39.0–52.0)
Hemoglobin: 16.3 g/dL (ref 13.0–17.0)
Potassium: 4 mmol/L (ref 3.5–5.1)
Sodium: 139 mmol/L (ref 135–145)
TCO2: 23 mmol/L (ref 22–32)

## 2024-10-23 LAB — COMPREHENSIVE METABOLIC PANEL WITH GFR
ALT: 15 U/L (ref 0–44)
AST: 19 U/L (ref 15–41)
Albumin: 4.5 g/dL (ref 3.5–5.0)
Alkaline Phosphatase: 70 U/L (ref 38–126)
Anion gap: 11 (ref 5–15)
BUN: 21 mg/dL — ABNORMAL HIGH (ref 6–20)
CO2: 23 mmol/L (ref 22–32)
Calcium: 10 mg/dL (ref 8.9–10.3)
Chloride: 103 mmol/L (ref 98–111)
Creatinine, Ser: 1.45 mg/dL — ABNORMAL HIGH (ref 0.61–1.24)
GFR, Estimated: 57 mL/min — ABNORMAL LOW
Glucose, Bld: 147 mg/dL — ABNORMAL HIGH (ref 70–99)
Potassium: 4.2 mmol/L (ref 3.5–5.1)
Sodium: 137 mmol/L (ref 135–145)
Total Bilirubin: 0.5 mg/dL (ref 0.0–1.2)
Total Protein: 7.9 g/dL (ref 6.5–8.1)

## 2024-10-23 LAB — DIFFERENTIAL
Abs Immature Granulocytes: 0.03 K/uL (ref 0.00–0.07)
Basophils Absolute: 0 K/uL (ref 0.0–0.1)
Basophils Relative: 0 %
Eosinophils Absolute: 0.2 K/uL (ref 0.0–0.5)
Eosinophils Relative: 2 %
Immature Granulocytes: 0 %
Lymphocytes Relative: 40 %
Lymphs Abs: 4 K/uL (ref 0.7–4.0)
Monocytes Absolute: 0.7 K/uL (ref 0.1–1.0)
Monocytes Relative: 7 %
Neutro Abs: 5.2 K/uL (ref 1.7–7.7)
Neutrophils Relative %: 51 %

## 2024-10-23 LAB — CBG MONITORING, ED: Glucose-Capillary: 164 mg/dL — ABNORMAL HIGH (ref 70–99)

## 2024-10-23 LAB — PROTIME-INR
INR: 1.1 (ref 0.8–1.2)
Prothrombin Time: 14.3 s (ref 11.4–15.2)

## 2024-10-23 LAB — ETHANOL: Alcohol, Ethyl (B): <15 mg/dL

## 2024-10-23 MED ORDER — SODIUM CHLORIDE 0.9% FLUSH
3.0000 mL | Freq: Once | INTRAVENOUS | Status: AC
  Administered 2024-10-23: 3 mL via INTRAVENOUS

## 2024-10-23 NOTE — ED Notes (Signed)
 Pt going to MRI

## 2024-10-23 NOTE — ED Triage Notes (Signed)
 Patient family endorses L sided weakness and facial droop starting 10-21-24 at 0600 upon waking. Patient has hx of stroke previously.

## 2024-10-23 NOTE — ED Provider Notes (Signed)
 " [facility] Provider Note   CSN: [id number] Arrival date & time: 10/23/24  8065     Patient presents with: Weakness and Facial Droop   Isaac Ray is a 53 y.o. male.   HPI   53 year old male presents emergency department accompanied by spouse for concern of left-sided facial droop, episode of slurred speech, dropping objects pacifically from the left hand for the past 2 days.  Now endorsing a headache that started this morning.  Patient denies any dizziness, vision changes/loss, numbness or difficulty walking.  Is supposed to be on Coumadin  but states that he has not taken this medication in the last 4 to 5 months due to traveling to [place] and not having it anymore.  Prior to Admission medications  Medication Sig Start Date End Date Taking? Authorizing Provider  acetaminophen  (TYLENOL ) 325 MG tablet Take 2 tablets (650 mg total) by mouth every 6 (six) hours as needed for mild pain (or temp > 100). 04/27/19   Meuth, Brooke A, PA-C  atorvastatin  (LIPITOR ) 80 MG tablet Take 1 tablet (80 mg total) by mouth daily. 04/27/21 04/27/22  McClung, Angela M, PA-C  carvedilol  (COREG ) 3.125 MG tablet Take 1 tablet (3.125 mg total) by mouth 2 (two) times daily with a meal. 04/27/21 04/27/22  Danton Jon HERO, PA-C  glipiZIDE  (GLUCOTROL  XL) 2.5 MG 24 hr tablet Take 1 tablet (2.5 mg total) by mouth daily with breakfast. 04/27/21   Danton Jon HERO, PA-C  metFORMIN  (GLUCOPHAGE ) 1000 MG tablet Take 1 tablet (1,000 mg total) by mouth 2 (two) times daily with a meal. 04/27/21 04/27/22  Danton Jon HERO, PA-C  spironolactone  (ALDACTONE ) 25 MG tablet Take 0.5 tablets (12.5 mg total) by mouth daily. 04/27/21 04/27/22  Danton Jon HERO, PA-C  warfarin (COUMADIN ) 5 MG tablet Tome 1 or 1.5 tabletas cada dia como se indica 11/27/21   Court Dorn PARAS, MD    Allergies: Patient has no known allergies.    Review of Systems  Constitutional:  Negative for fever.   Respiratory:  Negative for shortness of breath.   Cardiovascular:  Negative for chest pain.  Gastrointestinal:  Negative for abdominal pain, diarrhea and vomiting.  Skin:  Negative for rash.  Neurological:  Positive for facial asymmetry, speech difficulty, weakness and headaches. Negative for dizziness and numbness.    Updated Vital Signs BP 137/63 (BP Location: Right Arm)   Pulse (!) 53   Temp 98.4 F (36.9 C) (Oral)   Resp 16   Ht 5' 7 (1.702 m)   Wt 74.8 kg   SpO2 100%   BMI 25.83 kg/m   Physical Exam Vitals and nursing note reviewed.  Constitutional:      Appearance: Normal appearance.  HENT:     Head: Normocephalic.     Mouth/Throat:     Mouth: Mucous membranes are moist.  Cardiovascular:     Rate and Rhythm: Normal rate.  Pulmonary:     Effort: Pulmonary effort is normal. No respiratory distress.  Abdominal:     Palpations: Abdomen is soft.     Tenderness: There is no abdominal tenderness.  Skin:    General: Skin is warm.  Neurological:     Mental Status: He is alert and oriented to person, place, and time. Mental status is at baseline.     Comments: NIH of 3 for facial droop, mild dysarthria, left upper extremity weakness   Psychiatric:        Mood and Affect: Mood  normal.     (all labs ordered are listed, but only abnormal results are displayed) Labs Reviewed  COMPREHENSIVE METABOLIC PANEL WITH GFR - Abnormal; Notable for the following components:      Result Value   Glucose, Bld 147 (*)    BUN 21 (*)    Creatinine, Ser 1.45 (*)    GFR, Estimated 57 (*)    All other components within normal limits  I-STAT CHEM 8, ED - Abnormal; Notable for the following components:   BUN 22 (*)    Creatinine, Ser 1.50 (*)    Glucose, Bld 147 (*)    Calcium , Ion 1.14 (*)    All other components within normal limits  CBG MONITORING, ED - Abnormal; Notable for the following components:   Glucose-Capillary 164 (*)    All other components within normal limits   PROTIME-INR  APTT  CBC  DIFFERENTIAL  ETHANOL  I-STAT CHEM 8, ED    EKG: None  Radiology: CT HEAD WO CONTRAST Addendum Date: 10/23/2024  ADDENDUM #1 ADDENDUM: These findings were discussed with Dr. Bari over the phone by Dr. Margarite on 10/23/24 at 9:07 pm ---------------------------------------------------- Electronically signed by: Kate Margarite MD 10/23/2024 09:08 PM EST RP Workstation: [id number]   Result Date: 10/23/2024 ORIGINAL REPORT  EXAM: CT HEAD WITHOUT CONTRAST 10/23/2024 08:12:13 PM TECHNIQUE: CT of the head was performed without the administration of intravenous contrast. Automated exposure control, iterative reconstruction, and/or weight based adjustment of the mA/kV was utilized to reduce the radiation dose to as low as reasonably achievable. COMPARISON: None available. CLINICAL HISTORY: Neuro deficit, acute, stroke suspected. FINDINGS: BRAIN AND VENTRICLES: No acute hemorrhage. Loss of gray-white matter differentiation of the right frontal lobe with associated edema. No hydrocephalus. No extra-axial collection. No midline shift. ORBITS: No acute abnormality. SINUSES: No acute abnormality. SOFT TISSUES AND SKULL: No acute soft tissue abnormality. No skull fracture. IMPRESSION: 1. 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 2. No intracranial hemorrhage. Electronically signed by: Morgane Naveau MD 10/23/2024 09:03 PM EST RP Workstation: [id number]     .Critical Care  Performed by: Bari Roxie HERO, DO Authorized by: Bari Roxie HERO, DO   Critical care provider statement:    Critical care time (minutes):  75   Critical care time was exclusive of:  Separately billable procedures and treating other patients   Critical care was necessary to treat or prevent imminent or life-threatening deterioration of the following conditions:  CNS failure or compromise   Critical care was time spent personally by me on the following activities:  Development of treatment plan with  patient or surrogate, discussions with consultants, evaluation of patient's response to treatment, examination of patient, ordering and review of laboratory studies, ordering and review of radiographic studies, ordering and performing treatments and interventions, pulse oximetry, re-evaluation of patient's condition and review of old charts   I assumed direction of critical care for this patient from another provider in my specialty: no     Care discussed with: admitting provider      Medications Ordered in the ED  sodium chloride  flush (NS) 0.9 % injection 3 mL (has no administration in time range)                                    Medical Decision Making Amount and/or Complexity of Data Reviewed Labs: ordered. Radiology: ordered.  Risk Decision regarding hospitalization.   53 year old male presents emergency department with  2 days of left-sided facial droop, slurred speech, weakness in the left hand as well as headache that started this morning.  Noted to be on Coumadin  but patient has been noncompliant with this for the last 4 to 5 months.  Patient is Spanish-speaking, interpreter services used.  Patient was seen in triage, I was called by radiology with a positive head CT of an acute/subacute right frontal stroke.  NIH of 3 on my exam for left-sided facial droop, slurred speech, weakness in the left upper extremity.  Blood work shows mild AKI, otherwise is reassuring.  INR is 1.1 consistent with the patient being noncompliant with Coumadin .  Neurology consult has been placed.  Dr. Khaliqdina has evaluated the patient.  Recommends MRI of the brain with and without and medical admission for stroke workup.  Patient and family understand.  Patients evaluation and results requires admission for further treatment and care.  Spoke with hospitalist, reviewed patient's ED course and they accept admission.  Patient agrees with admission plan, offers no new complaints and is stable/unchanged  at time of admit.     Final diagnoses:  None    ED Discharge Orders     None          Bari Roxie HERO, DO 10/23/24 2327  "

## 2024-10-23 NOTE — ED Triage Notes (Addendum)
 BIB family, left sided facial droop, Slurred speech, dropping things and not being able to hold on things x 2 days. Headache started this morning. Denies any weakness on one side. Denies dizziness. Denies pain.

## 2024-10-23 NOTE — Consult Note (Addendum)
 NEUROLOGY CONSULT NOTE   Date of service: October 23, 2024 Patient Name: Isaac Ray MRN:  [id number] DOB:  1971/01/11 Chief Complaint: L sided weakness Requesting Provider: Bari Roxie HERO, DO  History of Present Illness  Isaac Ray is a 53 y.o. male with hx of CAD s/p CABG, HTN, DM2, HLD who woke up 2 days ago with L sided weakness. Dropping things from L hand, walking is off and wife has noted L facial droop. He was coming back from [place] and did not think too much about it. Symptoms persistent so came to the ED.  He denies any prior hx of stroke, mother had strokes, endorses hx of DM2, HTN, HLD, smokes cigarettes and goes through a pack in 3 days. Does not use any recreational substances.  Denies any prior history of atrial fibrillation.  LKW: 2 days ago Modified rankin score: 0-Completely asymptomatic and back to baseline post- stroke IV Thrombolysis: Not offered, outside of window.   EVT: Not offered, outside of window.    NIHSS components Score: Comment  1a Level of Conscious 0[x]  1[]  2[]  3[]      1b LOC Questions 0[x]  1[]  2[]       1c LOC Commands 0[x]  1[]  2[]       2 Best Gaze 0[x]  1[]  2[]       3 Visual 0[x]  1[]  2[]  3[]      4 Facial Palsy 0[]  1[x]  2[]  3[]      5a Motor Arm - left 0[x]  1[]  2[]  3[]  4[]  UN[]    5b Motor Arm - Right 0[x]  1[]  2[]  3[]  4[]  UN[]    6a Motor Leg - Left 0[x]  1[]  2[]  3[]  4[]  UN[]    6b Motor Leg - Right 0[x]  1[]  2[]  3[]  4[]  UN[]    7 Limb Ataxia 0[x]  1[]  2[]  UN[]      8 Sensory 0[x]  1[]  2[]  UN[]      9 Best Language 0[x]  1[]  2[]  3[]      10 Dysarthria 0[]  1[x]  2[]  UN[]      11 Extinct. and Inattention 0[x]  1[]  2[]       TOTAL: 2      ROS  Comprehensive ROS performed and pertinent positives documented in HPI   Past History   Past Medical History:  Diagnosis Date   CAD (coronary artery disease)    Essential hypertension    Heart murmur    when I was a teenager- no mention of it now   Hyperlipidemia    LV (left  ventricular) mural thrombus without MI (HCC) 04/27/2019   Type 2 diabetes mellitus (HCC)    Type II    Past Surgical History:  Procedure Laterality Date   AMPUTATION Left 04/08/2019   Procedure: LEFT FOOT 4TH TOE AMPUTATION;  Surgeon: Harden Jerona GAILS, MD;  Location: [facility] OR;  Service: Orthopedics;  Laterality: Left;   CORONARY ANGIOPLASTY WITH STENT PLACEMENT  2013   CORONARY ARTERY BYPASS GRAFT N/A 02/16/2015   Procedure: CORONARY ARTERY BYPASS GRAFTING (CABG) x5 using left internal mammary artery, right thigh greater saphenous vein, and left radial artery. ;  Surgeon: Elspeth JAYSON Millers, MD;  Location: [facility] OR;  Service: Open Heart Surgery;  Laterality: N/A;   LAPAROSCOPIC APPENDECTOMY N/A 04/25/2019   Procedure: APPENDECTOMY LAPAROSCOPIC;  Surgeon: Kinsinger, Herlene Righter, MD;  Location: [facility] OR;  Service: General;  Laterality: N/A;   LEFT HEART CATHETERIZATION WITH CORONARY ANGIOGRAM N/A 02/14/2015   Procedure: LEFT HEART CATHETERIZATION WITH CORONARY ANGIOGRAM;  Surgeon: Dorn JINNY Lesches, MD;  Location: [facility] CATH LAB;  Service: Cardiovascular;  Laterality: N/A;   LOWER EXTREMITY ANGIOGRAPHY N/A 03/30/2019   Procedure: LOWER EXTREMITY ANGIOGRAPHY;  Surgeon: Court Dorn PARAS, MD;  Location: [facility] INVASIVE CV LAB;  Service: Cardiovascular;  Laterality: N/A;   RADIAL ARTERY HARVEST Left 02/16/2015   Procedure: RADIAL ARTERY HARVEST;  Surgeon: Elspeth JAYSON Millers, MD;  Location[facility] OR;  Service: Open Heart Surgery;  Laterality: Left;   TEE WITHOUT CARDIOVERSION N/A 02/16/2015   Procedure: TRANSESOPHAGEAL ECHOCARDIOGRAM (TEE);  Surgeon: Elspeth JAYSON Millers, MD;  Location: [facility] OR;  Service: Open Heart Surgery;  Laterality: N/A;    Family History: Family History  Problem Relation Age of Onset   Coronary artery disease Father        had CABG   Heart disease Father     Social History  reports that he quit smoking about 9 years ago. He started smoking about 39 years ago. He has never used smokeless tobacco.  He reports that he does not drink alcohol and does not use drugs.  Allergies[1]  Medications  Current Medications[2]  Vitals   Vitals:   10/23/24 1943 10/23/24 2139 10/23/24 2141  BP: 137/63 (!) 136/49   Pulse: (!) 53    Resp: 16    Temp: 98.4 F (36.9 C)    TempSrc: Oral    SpO2: 100%    Weight:   74.8 kg  Height:   5' 7 (1.702 m)    Body mass index is 25.83 kg/m.   Physical Exam   General: Laying comfortably in bed; in no acute distress.  HENT: Normal oropharynx and mucosa. Normal external appearance of ears and nose.  Neck: Supple, no pain or tenderness  CV: No JVD. No peripheral edema.  Pulmonary: Symmetric Chest rise. Normal respiratory effort.  Abdomen: Soft to touch, non-tender.  Ext: No cyanosis, edema, or deformity  Skin: No rash. Normal palpation of skin.   Musculoskeletal: Normal digits and nails by inspection. No clubbing.   Neurologic Examination  Mental status/Cognition: Alert, oriented to self, place, month and year, good attention.  Speech/language: Fluent, comprehension intact, object naming intact, repetition intact.  Cranial nerves:   CN II Pupils equal and reactive to light, no VF deficits    CN III,IV,VI EOM intact, no gaze preference or deviation, no nystagmus    CN V normal sensation in V1, V2, and V3 segments bilaterally    CN VII Left facial droop   CN VIII normal hearing to speech    CN IX & X normal palatal elevation, no uvular deviation    CN XI 5/5 head turn and 5/5 shoulder shrug bilaterally    CN XII midline tongue protrusion    Motor:  Muscle bulk: normal, tone normal, pronator drift noted in LUE Mvmt Root Nerve  Muscle Right Left Comments  SA C5/6 Ax Deltoid 5 4+   EF C5/6 Mc Biceps 5 4+   EE C6/7/8 Rad Triceps 5 4+   WF C6/7 Med FCR     WE C7/8 PIN ECU     F Ab C8/T1 U ADM/FDI 5 4+   HF L1/2/3 Fem Illopsoas 5 4+   KE L2/3/4 Fem Quad     DF L4/5 D Peron Tib Ant 5 4+   PF S1/2 Tibial Grc/Sol 5 5    Sensation:  Light  touch Intact throughout   Pin prick    Temperature    Vibration   Proprioception    Coordination/Complex Motor:  - Finger to Nose intact bilaterally - Heel to shin intact bilaterally -  Rapid alternating movement are slowed on the left - Gait: Deferred. Labs/Imaging/Neurodiagnostic studies   CBC:  Recent Labs  Lab 11-09-2024 1954 09-Nov-2024 1957  WBC  --  10.1  NEUTROABS  --  5.2  HGB 16.3 16.4  HCT 48.0 47.7  MCV  --  92.1  PLT  --  225   Basic Metabolic Panel:  Lab Results  Component Value Date   NA 137 Nov 09, 2024   K 4.2 Nov 09, 2024   CO2 23 11/09/2024   GLUCOSE 147 (H) 2024/11/09   BUN 21 (H) 2024-11-09   CREATININE 1.45 (H) 11-09-2024   CALCIUM  10.0 Nov 09, 2024   GFRNONAA 57 (L) 11-09-2024   GFRAA 110 05/18/2020   Lipid Panel:  Lab Results  Component Value Date   LDLCALC 60 05/18/2020   HgbA1c:  Lab Results  Component Value Date   HGBA1C 9.3 (H) 03/15/2021   Urine Drug Screen: No results found for: LABOPIA, COCAINSCRNUR, LABBENZ, AMPHETMU, THCU, LABBARB  Alcohol Level     Component Value Date/Time   [facility] <15 2024-11-09 1957   INR  Lab Results  Component Value Date   INR 1.1 2024-11-09   APTT  Lab Results  Component Value Date   APTT 29 11/09/24   AED levels: No results found for: PHENYTOIN, ZONISAMIDE, LAMOTRIGINE, LEVETIRACETA  CT Head without contrast(Personally reviewed): 1. 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 2. No intracranial hemorrhage.  CT angio Head and Neck with contrast(Personally reviewed): Pending  MRI Brain(Personally reviewed): Pending ASSESSMENT   Isaac Ray is a 53 y.o. male with hx of CAD s/p CABG, HTN, DM2, HLD who woke up 2 days ago with L sided weakness.  CT head is concerning for an acute/subacute right frontal lobe infarct.  The appearance of the infarct looks a little bit on, seems to be right at the border of right ACA/MCA junction.  Given his history of smoking, a  metastatic process is also in the differential.  In addition to stroke workup, we will get the MRI of the brain with and without contrast.  RECOMMENDATIONS  - Frequent Neuro checks. - Recommend brain imaging with MRI Brain with and without contrast.  If the MRI of the brain shows an acute stroke, recommend loading with aspirin  and Plavix  and getting full stroke workup including vessel imaging with CTA of the head and neck, echocardiogram, lipid panel, A1c, permissive hypertension, PT/OT evaluation. ______________________________________________________________________  Plan was discussed with patient and his wife at the bedside.  Plan was also discussed with Dr. Bari with the ED team.  I personally spent a total of 80 minutes in the care of the patient today including preparing to see the patient, getting/reviewing separately obtained history, performing a medically appropriate exam/evaluation, counseling and educating, referring and communicating with other health care professionals, documenting clinical information in the EHR, independently interpreting results, communicating results, and coordinating care.   Signed, Jaiquan Temme, MD [facility]     [1] No Known Allergies [2] No current facility-administered medications for this encounter.  Current Outpatient Medications:    acetaminophen  (TYLENOL ) 325 MG tablet, Take 2 tablets (650 mg total) by mouth every 6 (six) hours as needed for mild pain (or temp > 100)., Disp: , Rfl:    atorvastatin  (LIPITOR ) 80 MG tablet, Take 1 tablet (80 mg total) by mouth daily., Disp: 90 tablet, Rfl: 1   carvedilol  (COREG ) 3.125 MG tablet, Take 1 tablet (3.125 mg total) by mouth 2 (two) times daily with a meal., Disp: 60 tablet, Rfl: 2  glipiZIDE  (GLUCOTROL  XL) 2.5 MG 24 hr tablet, Take 1 tablet (2.5 mg total) by mouth daily with breakfast., Disp: 30 tablet, Rfl: 2   metFORMIN  (GLUCOPHAGE ) 1000 MG tablet, Take 1 tablet (1,000 mg total) by  mouth 2 (two) times daily with a meal., Disp: 60 tablet, Rfl: 2   spironolactone  (ALDACTONE ) 25 MG tablet, Take 0.5 tablets (12.5 mg total) by mouth daily., Disp: 30 tablet, Rfl: 2   warfarin (COUMADIN ) 5 MG tablet, Tome 1 or 1.5 tabletas cada dia como se indica, Disp: 135 tablet, Rfl: 0

## 2024-10-24 ENCOUNTER — Inpatient Hospital Stay (HOSPITAL_COMMUNITY): Payer: MEDICAID

## 2024-10-24 DIAGNOSIS — I10 Essential (primary) hypertension: Secondary | ICD-10-CM

## 2024-10-24 DIAGNOSIS — I63411 Cerebral infarction due to embolism of right middle cerebral artery: Secondary | ICD-10-CM

## 2024-10-24 DIAGNOSIS — E119 Type 2 diabetes mellitus without complications: Secondary | ICD-10-CM

## 2024-10-24 DIAGNOSIS — E785 Hyperlipidemia, unspecified: Secondary | ICD-10-CM

## 2024-10-24 DIAGNOSIS — I6389 Other cerebral infarction: Secondary | ICD-10-CM

## 2024-10-24 LAB — ECHOCARDIOGRAM COMPLETE
AR max vel: 2.59 cm2
AV Area VTI: 2.43 cm2
AV Area mean vel: 2.22 cm2
AV Mean grad: 8 mmHg
AV Peak grad: 15.7 mmHg
Ao pk vel: 1.98 m/s
Area-P 1/2: 1.71 cm2
Calc EF: 20.3 %
Height: 67 in
S' Lateral: 6.3 cm
Single Plane A2C EF: 16 %
Single Plane A4C EF: 22.7 %
Weight: 2638.47 [oz_av]

## 2024-10-24 LAB — LIPID PANEL
Cholesterol: 152 mg/dL (ref 0–200)
HDL: 27 mg/dL — ABNORMAL LOW
LDL Cholesterol: 102 mg/dL — ABNORMAL HIGH (ref 0–99)
Total CHOL/HDL Ratio: 5.7 ratio
Triglycerides: 118 mg/dL
VLDL: 24 mg/dL (ref 0–40)

## 2024-10-24 LAB — CBG MONITORING, ED
Glucose-Capillary: 179 mg/dL — ABNORMAL HIGH (ref 70–99)
Glucose-Capillary: 129 mg/dL — ABNORMAL HIGH (ref 70–99)
Glucose-Capillary: 117 mg/dL — ABNORMAL HIGH (ref 70–99)

## 2024-10-24 LAB — HEMOGLOBIN A1C
Hgb A1c MFr Bld: 8.6 % — ABNORMAL HIGH (ref 4.8–5.6)
Mean Plasma Glucose: 200.12 mg/dL

## 2024-10-24 LAB — GLUCOSE, CAPILLARY
Glucose-Capillary: 112 mg/dL — ABNORMAL HIGH (ref 70–99)
Glucose-Capillary: 120 mg/dL — ABNORMAL HIGH (ref 70–99)

## 2024-10-24 MED ORDER — GADOBUTROL 1 MMOL/ML IV SOLN
7.0000 mL | Freq: Once | INTRAVENOUS | Status: AC | PRN
  Administered 2024-10-24: 7 mL via INTRAVENOUS

## 2024-10-24 MED ORDER — INFLUENZA VIRUS VACC SPLIT PF (FLUZONE) 0.5 ML IM SUSY
0.5000 mL | PREFILLED_SYRINGE | INTRAMUSCULAR | Status: AC
  Administered 2024-10-27: 0.5 mL via INTRAMUSCULAR

## 2024-10-24 MED ORDER — ONDANSETRON HCL 4 MG PO TABS: 4.0000 mg | ORAL_TABLET | Freq: Four times a day (QID) | ORAL | Status: DC | PRN

## 2024-10-24 MED ORDER — ONDANSETRON HCL 4 MG/2ML IJ SOLN: 4.0000 mg | Freq: Four times a day (QID) | INTRAMUSCULAR | Status: DC | PRN

## 2024-10-24 MED ORDER — INSULIN GLARGINE 100 UNITS/ML SOLOSTAR PEN: 10.0000 [IU] | PEN_INJECTOR | SUBCUTANEOUS | Status: DC

## 2024-10-24 MED ORDER — ACETAMINOPHEN 650 MG RE SUPP: 650.0000 mg | Freq: Four times a day (QID) | RECTAL | Status: DC | PRN

## 2024-10-24 MED ORDER — ATORVASTATIN CALCIUM 80 MG PO TABS
80.0000 mg | ORAL_TABLET | Freq: Every day | ORAL | Status: DC
  Administered 2024-10-24 – 2024-10-27 (×4): 80 mg via ORAL
  Filled 2024-10-24: qty 1
  Filled 2024-10-24: qty 2

## 2024-10-24 MED ORDER — PERFLUTREN LIPID MICROSPHERE
1.0000 mL | INTRAVENOUS | Status: AC | PRN
  Administered 2024-10-24: 2 mL via INTRAVENOUS

## 2024-10-24 MED ORDER — CLOPIDOGREL BISULFATE 75 MG PO TABS
75.0000 mg | ORAL_TABLET | Freq: Every day | ORAL | Status: DC
  Administered 2024-10-25: 75 mg via ORAL
  Filled 2024-10-24: qty 1

## 2024-10-24 MED ORDER — ACETAMINOPHEN 325 MG PO TABS: 650.0000 mg | ORAL_TABLET | ORAL | Status: DC | PRN

## 2024-10-24 MED ORDER — SODIUM CHLORIDE 0.9 % IV SOLN: INTRAVENOUS | Status: DC

## 2024-10-24 MED ORDER — IOHEXOL 350 MG/ML SOLN
75.0000 mL | Freq: Once | INTRAVENOUS | Status: AC | PRN
  Administered 2024-10-24: 75 mL via INTRAVENOUS

## 2024-10-24 MED ORDER — ACETAMINOPHEN 160 MG/5ML PO SOLN: 650.0000 mg | ORAL | Status: DC | PRN

## 2024-10-24 MED ORDER — INSULIN GLARGINE 100 UNIT/ML ~~LOC~~ SOLN
10.0000 [IU] | Freq: Every day | SUBCUTANEOUS | Status: DC
  Administered 2024-10-24 – 2024-10-27 (×4): 10 [IU] via SUBCUTANEOUS
  Filled 2024-10-24 (×3): qty 0.1

## 2024-10-24 MED ORDER — ENSURE PLUS HIGH PROTEIN PO LIQD
237.0000 mL | Freq: Two times a day (BID) | ORAL | Status: DC
  Administered 2024-10-25 – 2024-10-26 (×4): 237 mL via ORAL

## 2024-10-24 MED ORDER — SPIRONOLACTONE 12.5 MG HALF TABLET: 12.5000 mg | ORAL_TABLET | Freq: Every day | ORAL | Status: DC

## 2024-10-24 MED ORDER — STROKE: EARLY STAGES OF RECOVERY BOOK
Freq: Once | Status: AC
  Filled 2024-10-24: qty 1

## 2024-10-24 MED ORDER — CLOPIDOGREL BISULFATE 300 MG PO TABS
300.0000 mg | ORAL_TABLET | Freq: Once | ORAL | Status: AC
  Administered 2024-10-24: 300 mg via ORAL
  Filled 2024-10-24: qty 1

## 2024-10-24 MED ORDER — ASPIRIN 325 MG PO TABS
325.0000 mg | ORAL_TABLET | Freq: Once | ORAL | Status: AC
  Administered 2024-10-24: 325 mg via ORAL
  Filled 2024-10-24: qty 1

## 2024-10-24 MED ORDER — INSULIN ASPART 100 UNIT/ML IJ SOLN
0.0000 [IU] | Freq: Three times a day (TID) | INTRAMUSCULAR | Status: DC
  Administered 2024-10-25: 2 [IU] via SUBCUTANEOUS
  Administered 2024-10-25: 5 [IU] via SUBCUTANEOUS
  Administered 2024-10-25: 3 [IU] via SUBCUTANEOUS
  Administered 2024-10-26: 5 [IU] via SUBCUTANEOUS
  Administered 2024-10-26 – 2024-10-27 (×4): 2 [IU] via SUBCUTANEOUS
  Filled 2024-10-24: qty 2

## 2024-10-24 MED ORDER — INSULIN ASPART 100 UNIT/ML IJ SOLN: 0.0000 [IU] | Freq: Every day | INTRAMUSCULAR | Status: DC

## 2024-10-24 MED ORDER — PNEUMOCOCCAL 20-VAL CONJ VACC 0.5 ML IM SUSY
0.5000 mL | PREFILLED_SYRINGE | INTRAMUSCULAR | Status: AC
  Administered 2024-10-27: 0.5 mL via INTRAMUSCULAR
  Filled 2024-10-24: qty 0.5

## 2024-10-24 MED ORDER — ASPIRIN 81 MG PO TBEC
81.0000 mg | DELAYED_RELEASE_TABLET | Freq: Every day | ORAL | Status: DC
  Administered 2024-10-25: 81 mg via ORAL
  Filled 2024-10-24: qty 1

## 2024-10-24 MED ORDER — LACTATED RINGERS IV BOLUS
1000.0000 mL | Freq: Once | INTRAVENOUS | Status: AC
  Administered 2024-10-24: 1000 mL via INTRAVENOUS

## 2024-10-24 MED ORDER — ACETAMINOPHEN 650 MG RE SUPP: 650.0000 mg | RECTAL | Status: DC | PRN

## 2024-10-24 MED ORDER — ACETAMINOPHEN 325 MG PO TABS: 650.0000 mg | ORAL_TABLET | Freq: Four times a day (QID) | ORAL | Status: DC | PRN

## 2024-10-24 MED ORDER — CARVEDILOL 3.125 MG PO TABS: 3.1250 mg | ORAL_TABLET | Freq: Two times a day (BID) | ORAL | Status: DC

## 2024-10-24 NOTE — ED Notes (Signed)
 Echo at bedside

## 2024-10-24 NOTE — Progress Notes (Addendum)
 " PROGRESS NOTE    Isaac Ray  FMW:[id number] DOB: 1971/09/09 DOA: 10/23/2024 PCP: Delbert Clam, MD   Brief Narrative:  Isaac Ray is a 53 y.o. male with hx of CAD s/p CABG, HTN, DM2, HLD who woke up on 10/21/2024 with L sided weakness. Dropping things from L hand, walking is off and wife has noted L facial droop. He was coming back from [place] and did not think too much about it. Symptoms persistent so he came to the ED.   He denies any prior hx of stroke, mother had strokes. Does not use any recreational substances.  Denies any prior history of atrial fibrillation.  Diagnosed with a stroke.  Admitted under hospital service, neurology following.  Assessment & Plan:   Principal Problem:   CVA (cerebral vascular accident) (HCC)  Acute ischemic stroke/hyperlipidemia/hypertension, POA: Initial CT head unremarkable.  MRI brain shows acute infarct in right frontal lobe with smaller acute infarcts in the right more posterior right frontal and parietal lobes.  CT angio of the head and neck shows Near occlusion of the right MCA bi/trifurcation appears related to acute thromboembolic disease (ELVO), but with Reconstituted right MCA branches except for some of the anterior and middle divisions.  Patient seen by neurology, has been loaded with aspirin  and Plavix .  Hemoglobin A1c 8.6.  LDL 102, goal<70.  Has been started on atorvastatin  80 mg.  Patient's weakness has improved somewhat.  No problem with swallowing, speech or vision.  Echo pending.  To be assessed by PT OT.  Since symptoms started almost 3 days ago, no need for permissive hypertension.  PTA medications for hypertension include Coreg  and Aldactone  which are on hold at the moment.  Neurology following.  Awaiting further recommendations.  Type 2 diabetes mellitus: Patient appears to be taking glipizide  and metformin  at home, hemoglobin A1c 8.6.  Currently on SSI, slightly hyperglycemic, will add on 10 units of  Lantus .  History of CAD status post CABG: Asymptomatic.  As of now, he does not appear to be taking any antiplatelets.  Sinus bradycardia: Patient asymptomatic and not on any rate control medications.  Monitor for now.  DVT prophylaxis: SCDs Start: 10/24/24 0437   Code Status: Full Code  Family Communication: Wife present at bedside.  Plan of care discussed with patient in length and he/she verbalized understanding and agreed with it.  Status is: Inpatient Remains inpatient appropriate because: Needs further workup for a stroke.   Estimated body mass index is 25.83 kg/m as calculated from the following:   Height as of this encounter: 5' 7 (1.702 m).   Weight as of this encounter: 74.8 kg.    Nutritional Assessment: Body mass index is 25.83 kg/m.SABRA Seen by dietician.  I agree with the assessment and plan as outlined below: Nutrition Status:        . Skin Assessment: I have examined the patient's skin and I agree with the wound assessment as performed by the wound care RN as outlined below:    Consultants:  Neurology  Procedures:  As above  Antimicrobials:  Anti-infectives (From admission, onward)    None         Subjective: Patient seen and examined using remote language interpreter.  Patient states that his weakness on the left side is improving.  He had no other complaints.  He was about to be evaluated by PT.  Wife was at the bedside.  Objective: Vitals:   10/24/24 0600 10/24/24 0654 10/24/24 0700 10/24/24 0800  BP:  119/64  (!) 120/56 121/69  Pulse: (!) 47  (!) 47 (!) 49  Resp: 17  18 14   Temp:  98.2 F (36.8 C)    TempSrc:  Oral    SpO2: 95%  97% 95%  Weight:      Height:       No intake or output data in the 24 hours ending 10/24/24 0915 Filed Weights   10/23/24 2141  Weight: 74.8 kg    Examination:  General exam: Appears calm and comfortable  Respiratory system: Clear to auscultation. Respiratory effort normal. Cardiovascular system: S1  & S2 heard, RRR. No JVD, murmurs, rubs, gallops or clicks. No pedal edema. Gastrointestinal system: Abdomen is nondistended, soft and nontender. No organomegaly or masses felt. Normal bowel sounds heard. Central nervous system: Alert and oriented.  4/5 power in left upper and lower extremity. Extremities: Symmetric 5 x 5 power. Skin: No rashes, lesions or ulcers Psychiatry: Judgement and insight appear normal. Mood & affect appropriate.    Data Reviewed: I have personally reviewed following labs and imaging studies  CBC: Recent Labs  Lab 10/23/24 1954 10/23/24 1957  WBC  --  10.1  NEUTROABS  --  5.2  HGB 16.3 16.4  HCT 48.0 47.7  MCV  --  92.1  PLT  --  225   Basic Metabolic Panel: Recent Labs  Lab 10/23/24 1954 10/23/24 1957  NA 139 137  K 4.0 4.2  CL 105 103  CO2  --  23  GLUCOSE 147* 147*  BUN 22* 21*  CREATININE 1.50* 1.45*  CALCIUM   --  10.0   GFR: Estimated Creatinine Clearance: 53.8 mL/min (A) (by C-G formula based on SCr of 1.45 mg/dL (H)). Liver Function Tests: Recent Labs  Lab 10/23/24 1957  AST 19  ALT 15  ALKPHOS 70  BILITOT 0.5  PROT 7.9  ALBUMIN  4.5   No results for input(s): LIPASE, AMYLASE in the last 168 hours. No results for input(s): AMMONIA in the last 168 hours. Coagulation Profile: Recent Labs  Lab 10/23/24 1957  INR 1.1   Cardiac Enzymes: No results for input(s): CKTOTAL, CKMB, CKMBINDEX, TROPONINI in the last 168 hours. BNP (last 3 results) No results for input(s): PROBNP in the last 8760 hours. HbA1C: Recent Labs    10/24/24 0458  HGBA1C 8.6*   CBG: Recent Labs  Lab 10/23/24 2003 10/24/24 0556  GLUCAP 164* 179*   Lipid Profile: Recent Labs    10/24/24 0458  CHOL 152  HDL 27*  LDLCALC 102*  TRIG 118  CHOLHDL 5.7   Thyroid Function Tests: No results for input(s): TSH, T4TOTAL, FREET4, T3FREE, THYROIDAB in the last 72 hours. Anemia Panel: No results for input(s): VITAMINB12,  FOLATE, FERRITIN, TIBC, IRON, RETICCTPCT in the last 72 hours. Sepsis Labs: No results for input(s): PROCALCITON, LATICACIDVEN in the last 168 hours.  No results found for this or any previous visit (from the past 240 hours).   Radiology Studies:   Scheduled Meds:  [START ON 10/25/2024]  stroke: early stages of recovery book   Does not apply Once   [START ON 10/25/2024] aspirin  EC  81 mg Oral Daily   atorvastatin   80 mg Oral Daily   [START ON 10/25/2024] clopidogrel   75 mg Oral Daily   insulin  aspart  0-15 Units Subcutaneous TID WC   insulin  aspart  0-5 Units Subcutaneous QHS   Continuous Infusions:   LOS: 1 day   Fredia Skeeter, MD [facility]  10/24/2024, 9:15 AM   *Please  note that this is a verbal dictation therefore any spelling or grammatical errors are due to the Dragon Medical One system interpretation.  Please page via Amion and do not message via secure chat for urgent patient care matters. Secure chat can be used for non urgent patient care matters.  How to contact the [facility] Attending or Consulting provider 7A - 7P or covering provider during after hours 7P -7A, for this patient?  Check the care team in [facility] and look for a) attending/consulting [facility] provider listed and b) the [facility] team listed. Page or secure chat 7A-7P. Log into [email or web address] and use [facility] universal password to access. If you do not have the password, please contact the hospital operator. Locate the [facility] provider you are looking for under [facility] and page to a number that you can be directly reached. If you still have difficulty reaching the provider, please page the [facility] (Director on Call) for the Hospitalists listed on amion for assistance.  "

## 2024-10-24 NOTE — Evaluation (Signed)
 Physical Therapy Evaluation Patient Details Name: Isaac Ray MRN: [id number] DOB: 02-14-1971 Today's Date: 10/24/2024  History of Present Illness  53 y.o. male presented 12/26 after waking up on 10/21/2024 with L sided weakness. Dropping things from L hand, walking is off and wife has noted L facial droop. MRI brain shows acute infarct in right frontal lobe with smaller acute infarcts in the right more posterior right frontal and parietal lobes. PMH: of CAD s/p CABG, HTN, DM2, HLD.  Clinical Impression  Pt admitted with above diagnosis. Previously independent working as a curator. BERG balance test indicates moderate fall risk, suggests SPC use outdoors - pt agrees this would be helpful. Pt with mild dynamic balance deficits while ambulating, slight drift but able to correct with cues, navigate over obstacles, and tolerate various challenges without LOB. Suggest OPPT neuro rehab follow-up to help with higher level balance, gait, and fine motor tasks with LUE. Will follow acutely. Noted HR in upper 40s at rest. Increased to 60s-70s with ambulation. Denies dizziness.Pt currently with functional limitations due to the deficits listed below (see PT Problem List). Pt will benefit from acute skilled PT to increase their independence and safety with mobility to allow discharge.           If plan is discharge home, recommend the following: Assist for transportation;Help with stairs or ramp for entrance   Can travel by private vehicle        Equipment Recommendations Cane  Recommendations for Other Services       Functional Status Assessment Patient has had a recent decline in their functional status and demonstrates the ability to make significant improvements in function in a reasonable and predictable amount of time.     Precautions / Restrictions Precautions Precautions: Fall Recall of Precautions/Restrictions: Intact Restrictions Weight Bearing Restrictions Per Provider  Order: No      Mobility  Bed Mobility Overal bed mobility: Independent                  Transfers Overall transfer level: Modified independent Equipment used: None               General transfer comment: Slow to rise, guarded but stable with equal weight dirstribution    Ambulation/Gait Ambulation/Gait assistance: Supervision Gait Distance (Feet): 215 Feet Assistive device: None Gait Pattern/deviations: Step-through pattern, Decreased stride length, Drifts right/left Gait velocity: dec Gait velocity interpretation: >2.62 ft/sec, indicative of community ambulatory (self selcted gait speed 1.31-2.62 but can tolerate higher pace without LOB.)   General Gait Details: Minor drift towards left, able to correct with VC for awareness. No overt buckling. Minor instability present. Tolerated dynamic challenges without overt LOB, inclucing backwards stepping, stepping over objects, quick turns, variable speeds and head turns (vertical and horizontal.) Supervision for safety without physical intervention. VSS on RA. States he would feel more confident with a cane.  Stairs            Wheelchair Mobility     Tilt Bed    Modified Rankin (Stroke Patients Only) Modified Rankin (Stroke Patients Only) Pre-Morbid Rankin Score: No symptoms Modified Rankin: Moderate disability     Balance Overall balance assessment: Modified Independent                               Standardized Balance Assessment Standardized Balance Assessment : Berg Balance Test Berg Balance Test Sit to Stand: Able to stand without using hands and stabilize independently  Standing Unsupported: Able to stand safely 2 minutes Sitting with Back Unsupported but Feet Supported on Floor or Stool: Able to sit safely and securely 2 minutes Stand to Sit: Sits safely with minimal use of hands Transfers: Able to transfer safely, minor use of hands Standing Unsupported with Eyes Closed: Able to  stand 10 seconds safely Standing Ubsupported with Feet Together: Able to place feet together independently and stand 1 minute safely From Standing, Reach Forward with Outstretched Arm: Can reach confidently >25 cm (10) From Standing Position, Pick up Object from Floor: Able to pick up shoe safely and easily From Standing Position, Turn to Look Behind Over each Shoulder: Looks behind from both sides and weight shifts well Turn 360 Degrees: Able to turn 360 degrees safely in 4 seconds or less Standing Unsupported, Alternately Place Feet on Step/Stool: Able to complete 4 steps without aid or supervision Standing Unsupported, One Foot in Front: Loses balance while stepping or standing Standing on One Leg: Tries to lift leg/unable to hold 3 seconds but remains standing independently Total Score: 47         Pertinent Vitals/Pain Pain Assessment Pain Assessment: No/denies pain    Home Living Family/patient expects to be discharged to:: Private residence Living Arrangements: Spouse/significant other Available Help at Discharge: Family Type of Home: House Home Access: Stairs to enter Entrance Stairs-Rails: Doctor, General Practice of Steps: 3   Home Layout: One level Home Equipment: None      Prior Function Prior Level of Function : Independent/Modified Independent;Working/employed;Driving             Mobility Comments: Independent, works as a curator. ADLs Comments: Ind, works as Optometrist Extremity Assessment Upper Extremity Assessment: Defer to OT evaluation;Right hand dominant    Lower Extremity Assessment Lower Extremity Assessment: LLE deficits/detail LLE Deficits / Details: Minor functional weakness but able to fully bear weight. LLE Coordination: decreased gross motor;decreased fine motor       Communication   Communication Communication: No apparent difficulties Factors Affecting Communication: Non - English  speaking, interpreter not available    Cognition Arousal: Alert Behavior During Therapy: WFL for tasks assessed/performed   PT - Cognitive impairments: No apparent impairments                         Following commands: Intact       Cueing Cueing Techniques: Verbal cues     General Comments General comments (skin integrity, edema, etc.): Interpreter utilizied, wife present. Discussed s/s of stroke and to call 911.    Exercises     Assessment/Plan    PT Assessment Patient needs continued PT services  PT Problem List Decreased strength;Decreased activity tolerance;Decreased balance;Decreased mobility;Decreased coordination;Decreased knowledge of use of DME       PT Treatment Interventions DME instruction;Gait training;Stair training;Functional mobility training;Therapeutic activities;Therapeutic exercise;Balance training;Neuromuscular re-education;Patient/family education    PT Goals (Current goals can be found in the Care Plan section)  Acute Rehab PT Goals Patient Stated Goal: Return home PT Goal Formulation: With patient Time For Goal Achievement: 11/06/24 Potential to Achieve Goals: Good    Frequency Min 2X/week     Co-evaluation               AM-PAC PT 6 Clicks Mobility  Outcome Measure Help needed turning from your back to your side while in a flat bed without using bedrails?: None Help needed moving from lying on your back  to sitting on the side of a flat bed without using bedrails?: None Help needed moving to and from a bed to a chair (including a wheelchair)?: None Help needed standing up from a chair using your arms (e.g., wheelchair or bedside chair)?: None Help needed to walk in hospital room?: A Little Help needed climbing 3-5 steps with a railing? : A Little 6 Click Score: 22    End of Session Equipment Utilized During Treatment: Gait belt Activity Tolerance: Patient tolerated treatment well Patient left: in bed;with call  bell/phone within reach;with family/visitor present Nurse Communication: Mobility status PT Visit Diagnosis: Unsteadiness on feet (R26.81);Other abnormalities of gait and mobility (R26.89);Hemiplegia and hemiparesis;Difficulty in walking, not elsewhere classified (R26.2) Hemiplegia - Right/Left: Left Hemiplegia - dominant/non-dominant: Non-dominant Hemiplegia - caused by: Cerebral infarction    Time: 9095-9069 PT Time Calculation (min) (ACUTE ONLY): 26 min   Charges:   PT Evaluation $PT Eval Low Complexity: 1 Low PT Treatments $Gait Training: 8-22 mins PT General Charges $$ ACUTE PT VISIT: 1 Visit         Leontine Roads, PT, DPT [facility]  Rehabilitation Services Physical Therapist Office: [phone] Website: [email or web address]   Leontine GORMAN Roads 10/24/2024, 11:32 AM

## 2024-10-24 NOTE — Evaluation (Signed)
 Occupational Therapy Evaluation Patient Details Name: Isaac Ray MRN: [id number] DOB: 1971-09-10 Today's Date: 10/24/2024   History of Present Illness   53 y.o. male presented 12/26 after waking up on 10/21/2024 with L sided weakness. Dropping things from L hand, walking is off and wife has noted L facial droop. MRI brain shows acute infarct in right frontal lobe with smaller acute infarcts in the right more posterior right frontal and parietal lobes. PMH: of CAD s/p CABG, HTN, DM2, HLD.     Clinical Impressions Pt currently at supervision level for selfcare tasks sit to stand and for functional transfers secondary to left inattention and possible visual field deficit.  Pt with increased veering to the left with mobility in the hallway, barely missing multiple objects without awareness.  Kept head turned to the right when sitting EOB but does scan across midline to locate objects when asked.  Slight LUE FM coordination impairment but able to demonstrate functional use at a slight diminished level.  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  He will have 24 hour supervision as needed from family.  Feel he will benefit from post acute OT follow-up at outpatient level.       If plan is discharge home, recommend the following:   Assistance with cooking/housework;Help with stairs or ramp for entrance;Assist for transportation     Functional Status Assessment   Patient has had a recent decline in their functional status and demonstrates the ability to make significant improvements in function in a reasonable and predictable amount of time.     Equipment Recommendations   Other (comment) (recommended initial use of shower seat which pt's family will purchase or borrow from outside the hospital)      Precautions/Restrictions   Precautions Precautions: Fall Recall  of Precautions/Restrictions: Intact Precaution/Restrictions Comments: left inattention Restrictions Weight Bearing Restrictions Per Provider Order: No     Mobility Bed Mobility Overal bed mobility: Independent                  Transfers Overall transfer level: Modified independent Equipment used: None                      Balance Overall balance assessment: Mild deficits observed, not formally tested                                         ADL either performed or assessed with clinical judgement   ADL Overall ADL's : Needs assistance/impaired Eating/Feeding: Independent;Sitting   Grooming: Wash/dry hands;Wash/dry face;Supervision/safety;Standing   Upper Body Bathing: Set up;Sitting   Lower Body Bathing: Supervison/ safety;Sit to/from stand   Upper Body Dressing : Set up;Sitting   Lower Body Dressing: Supervision/safety;Sit to/from stand   Toilet Transfer: Supervision/safety;Ambulation   Toileting- Clothing Manipulation and Hygiene: Supervision/safety;Sit to/from stand       Functional mobility during ADLs: Supervision/safety General ADL Comments: Provided education to spouse, pt, and pt's daughter on left inattention and questionable visual impairment.  Difficult to accurately asses secondary to language barrier and pt's understanding of directions.  Provided FM coordination handout for the LUE in Spanish.     Vision Baseline Vision/History: 0 No visual deficits Ability to See in Adequate Light: 0 Adequate Patient Visual Report: No change from  baseline Vision Assessment?: Vision impaired- to be further tested in functional context;Yes Ocular Range of Motion: Within Functional Limits Alignment/Gaze Preference: Head turned (head turned frequently to the right) Tracking/Visual Pursuits: Decreased smoothness of horizontal tracking;Decreased smoothness of vertical tracking;Other (comment) (occasional lost fixation when tracking from left  to rigth) Convergence: Other (comment) (no convergence of either eye with gross testing) Visual Fields:  (Pt with some inconsistent misses to left lateral field but unsure if this is completely accurate as he had difficulty following directions to not turn his eyes.)     Perception Perception: Impaired Preception Impairment Details: Inattention/Neglect Perception-Other Comments: left inattention   Praxis Praxis: WFL       Pertinent Vitals/Pain Pain Assessment Pain Assessment: No/denies pain     Extremity/Trunk Assessment Upper Extremity Assessment Upper Extremity Assessment: LUE deficits/detail LUE Deficits / Details: AROM and strength WFLs, slight impairment in FM coordination compared to the RUE but uses functionally for tying gown with increased time and supervision. LUE Sensation: WNL LUE Coordination: decreased fine motor   Lower Extremity Assessment Lower Extremity Assessment: LLE deficits/detail LLE Deficits / Details: Minor functional weakness but able to fully bear weight. LLE Coordination: decreased gross motor;decreased fine motor       Communication Communication Communication: No apparent difficulties Factors Affecting Communication: Non - English speaking, interpreter not available   Cognition Arousal: Alert Behavior During Therapy: WFL for tasks assessed/performed Cognition: No apparent impairments                               Following commands: Intact       Cueing  General Comments   Cueing Techniques: Verbal cues  Interpreter utilizied, wife present. Discussed s/s of stroke and to call 911.           Home Living Family/patient expects to be discharged to:: Private residence Living Arrangements: Spouse/significant other Available Help at Discharge: Family Type of Home: House Home Access: Stairs to enter Secretary/administrator of Steps: 3 Entrance Stairs-Rails: Right;Left Home Layout: One level     Bathroom Shower/Tub:  Producer, Television/film/video: Standard     Home Equipment: None          Prior Functioning/Environment Prior Level of Function : Independent/Modified Independent;Working/employed;Driving             Mobility Comments: Independent, works as a curator. ADLs Comments: Ind, works as Patent Examiner Problem List: Impaired balance (sitting and/or standing);Impaired UE functional use;Decreased coordination;Decreased knowledge of use of DME or AE   OT Treatment/Interventions: Self-care/ADL training;Patient/family education;Balance training;Neuromuscular education;Therapeutic activities;DME and/or AE instruction;Therapeutic exercise      OT Goals(Current goals can be found in the care plan section)   Acute Rehab OT Goals Patient Stated Goal: None stated but agreeable to working with therapy OT Goal Formulation: With patient/family Time For Goal Achievement: 11/07/24 Potential to Achieve Goals: Good   OT Frequency:  Min 2X/week       AM-PAC OT 6 Clicks Daily Activity     Outcome Measure Help from another person eating meals?: None Help from another person taking care of personal grooming?: A Little Help from another person toileting, which includes using toliet, bedpan, or urinal?: A Little Help from another person bathing (including washing, rinsing, drying)?: A Little Help from another person to put on and taking off regular upper body clothing?: A Little Help from another person to put on and taking off regular lower  body clothing?: A Little 6 Click Score: 19   End of Session Nurse Communication: Mobility status  Activity Tolerance: Patient tolerated treatment well Patient left: in bed;with call bell/phone within reach  OT Visit Diagnosis: Unsteadiness on feet (R26.81);Muscle weakness (generalized) (M62.81);Hemiplegia and hemiparesis Hemiplegia - Right/Left: Left Hemiplegia - dominant/non-dominant: Non-Dominant Hemiplegia - caused by: Cerebral infarction                 Time: 8747-8666 OT Time Calculation (min): 41 min Charges:  OT General Charges $OT Visit: 1 Visit OT Evaluation $OT Eval Moderate Complexity: 1 Mod OT Treatments $Self Care/Home Management : 23-37 mins  Lynwood Constant, OTR/L Acute Rehabilitation Services  Office [phone] 10/24/2024

## 2024-10-24 NOTE — H&P (Signed)
 " History and Physical    Isaac Ray FMW:[id number] DOB: 03/06/1971 DOA: 10/23/2024  PCP: Delbert Clam, MD   Chief Complaint:  facial droop  HPI: Isaac Ray is a 53 y.o. male with medical history significant of CAD, hypertension, hyperal anemia who presented to emergency department with left-sided weakness.  Patient was dropping things on the left side and wife noted facial droop.  He presented to the ER.  He denied any prior history of stroke or other risk factors.  NIH stroke scale on arrival was 2.  Neurology was consulted.  He underwent CT head which showed some concern for acute frontal lobe infarct neurology recommended MRI brain.  If the MRI shows acute stroke to load with aspirin  and Plavix  and then complete stroke workup.  Labs obtained on presentation showed creatinine 1.45 baseline around 1, CBC unrevealing, glucose 164.  Review of Systems: Review of Systems  All other systems reviewed and are negative.    As per HPI otherwise 10 point review of systems negative.   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 ;  Surgeon: Elspeth JAYSON Millers, MD;  Location: [facility] OR;  Service: Open Heart Surgery;  Laterality: N/A;   LAPAROSCOPIC  APPENDECTOMY N/A 04/25/2019   Procedure: APPENDECTOMY LAPAROSCOPIC;  Surgeon: Kinsinger, Herlene Righter, MD;  Location: [facility] OR;  Service: General;  Laterality: N/A;   LEFT HEART CATHETERIZATION WITH CORONARY ANGIOGRAM N/A 02/14/2015   Procedure: LEFT HEART CATHETERIZATION WITH CORONARY ANGIOGRAM;  Surgeon: Dorn JINNY Lesches, MD;  Location[facility] CATH LAB;  Service: Cardiovascular;  Laterality: N/A;   LOWER EXTREMITY ANGIOGRAPHY N/A 03/30/2019   Procedure: LOWER EXTREMITY ANGIOGRAPHY;  Surgeon: Lesches Dorn JINNY, MD;  Location: [facility] INVASIVE CV LAB;  Service: Cardiovascular;  Laterality: N/A;   RADIAL ARTERY HARVEST Left 02/16/2015   Procedure: RADIAL ARTERY HARVEST;  Surgeon: Elspeth JAYSON Millers, MD;  Location: [facility] OR;  Service: Open Heart Surgery;  Laterality: Left;   TEE WITHOUT CARDIOVERSION N/A 02/16/2015   Procedure: TRANSESOPHAGEAL ECHOCARDIOGRAM (TEE);  Surgeon: Elspeth JAYSON Millers, MD;  Location: [facility] OR;  Service: Open Heart Surgery;  Laterality: N/A;     reports that he quit smoking about 9 years ago. He started smoking about 39 years ago. He has never used smokeless tobacco. He reports that he does not drink alcohol and does not use drugs.  Family History  Problem Relation Age of Onset   Coronary artery disease Father        had CABG   Heart disease Father     Prior to Admission medications  Medication Sig Start Date End Date Taking? Authorizing Provider  acetaminophen  (TYLENOL ) 325 MG tablet Take 2 tablets (650 mg total) by mouth every 6 (  six) hours as needed for mild pain (or temp > 100). 04/27/19   Meuth, Brooke A, PA-C  atorvastatin  (LIPITOR ) 80 MG tablet Take 1 tablet (80 mg total) by mouth daily. 04/27/21 04/27/22  McClung, Angela M, PA-C  carvedilol  (COREG ) 3.125 MG tablet Take 1 tablet (3.125 mg total) by mouth 2 (two) times daily with a meal. 04/27/21 04/27/22  Danton Jon HERO, PA-C  glipiZIDE  (GLUCOTROL  XL) 2.5 MG 24 hr tablet Take 1 tablet (2.5 mg total) by mouth daily with breakfast.  04/27/21   Danton Jon HERO, PA-C  metFORMIN  (GLUCOPHAGE ) 1000 MG tablet Take 1 tablet (1,000 mg total) by mouth 2 (two) times daily with a meal. 04/27/21 04/27/22  Danton Jon HERO, PA-C  spironolactone  (ALDACTONE ) 25 MG tablet Take 0.5 tablets (12.5 mg total) by mouth daily. 04/27/21 04/27/22  Danton Jon HERO, PA-C  warfarin (COUMADIN ) 5 MG tablet Tome 1 or 1.5 tabletas cada dia como se indica 11/27/21   Court Dorn PARAS, MD    Physical Exam: Vitals:   10/24/24 0200 10/24/24 0244 10/24/24 0300 10/24/24 0400  BP: 115/72  113/70 123/62  Pulse: (!) 48  (!) 48 (!) 49  Resp: 18  17 (!) 21  Temp:  98.1 F (36.7 C)    TempSrc:      SpO2: 94%  94% 100%  Weight:      Height:       Physical Exam Constitutional:      Appearance: He is normal weight.  HENT:     Head: Normocephalic.     Nose: Nose normal.     Mouth/Throat:     Mouth: Mucous membranes are moist.     Pharynx: Oropharynx is clear.  Eyes:     Extraocular Movements: Extraocular movements intact.     Conjunctiva/sclera: Conjunctivae normal.  Cardiovascular:     Rate and Rhythm: Normal rate and regular rhythm.     Pulses: Normal pulses.     Heart sounds: Normal heart sounds.  Pulmonary:     Effort: Pulmonary effort is normal.     Breath sounds: Normal breath sounds.  Abdominal:     General: Bowel sounds are normal.  Musculoskeletal:        General: Normal range of motion.     Cervical back: Normal range of motion.  Skin:    Capillary Refill: Capillary refill takes less than 2 seconds.  Neurological:     Mental Status: He is alert and oriented to person, place, and time.     Motor: Weakness present.  Psychiatric:        Mood and Affect: Mood normal.        Labs on Admission: I have personally reviewed the patients's labs and imaging studies.  Assessment/Plan Principal Problem:   CVA (cerebral vascular accident) (HCC)   # Acute infarcts in the right frontal lobe with smaller infarcts in the right frontal and  parietal lobes - Neurology consulted with concern for acute stroke -History of smoking  Plan: Obtain CTA head and neck Risk stratification Obtain echocardiogram Therapy in morning Appreciate neurology recommendations Start aspirin  Plavix   # Hypertension-continue carvedilol , spironolactone   # Hyperlipidemia-continue statin  # AKI-continue IV fluids  Admission status: Inpatient Telemetry  Certification: The appropriate patient status for this patient is INPATIENT. Inpatient status is judged to be reasonable and necessary in order to provide the required intensity of service to ensure the patient's safety. The patient's presenting symptoms, physical exam findings, and initial radiographic and laboratory data in the context  of their chronic comorbidities is felt to place them at high risk for further clinical deterioration. Furthermore, it is not anticipated that the patient will be medically stable for discharge from the hospital within 2 midnights of admission.   * I certify that at the point of admission it is my clinical judgment that the patient will require inpatient hospital care spanning beyond 2 midnights from the point of admission due to high intensity of service, high risk for further deterioration and high frequency of surveillance required.DEWAINE Lamar Dess MD [facility] If 7PM-7AM, please contact night-coverage [email or web address]  10/24/2024, 4:38 AM        [1] No Known Allergies  "

## 2024-10-24 NOTE — Progress Notes (Signed)
2D echo complete 

## 2024-10-24 NOTE — Progress Notes (Signed)
 STROKE TEAM PROGRESS NOTE   INTERIM HISTORY/SUBJECTIVE No acute events overnight.   OBJECTIVE  CBC    Component Value Date/Time   WBC 10.1 10/23/2024 1957   RBC 5.18 10/23/2024 1957   HGB 16.4 10/23/2024 1957   HGB 14.4 04/28/2021 0913   HCT 47.7 10/23/2024 1957   HCT 43.6 04/28/2021 0913   PLT 225 10/23/2024 1957   PLT 188 04/28/2021 0913   MCV 92.1 10/23/2024 1957   MCV 93 04/28/2021 0913   MCH 31.7 10/23/2024 1957   MCHC 34.4 10/23/2024 1957   RDW 12.9 10/23/2024 1957   RDW 12.8 04/28/2021 0913   LYMPHSABS 4.0 10/23/2024 1957   LYMPHSABS 3.0 04/28/2021 0913   MONOABS 0.7 10/23/2024 1957   EOSABS 0.2 10/23/2024 1957   EOSABS 0.2 04/28/2021 0913   BASOSABS 0.0 10/23/2024 1957   BASOSABS 0.0 04/28/2021 0913    BMET    Component Value Date/Time   NA 137 10/23/2024 1957   NA 139 04/28/2021 0913   K 4.2 10/23/2024 1957   CL 103 10/23/2024 1957   CO2 23 10/23/2024 1957   GLUCOSE 147 (H) 10/23/2024 1957   BUN 21 (H) 10/23/2024 1957   BUN 9 04/28/2021 0913   CREATININE 1.45 (H) 10/23/2024 1957   CALCIUM  10.0 10/23/2024 1957   EGFR 104 04/28/2021 0913   GFRNONAA 57 (L) 10/23/2024 1957    IMAGING past 24 hours CT ANGIO HEAD NECK W WO CM (CODE STROKE) Result Date: 10/24/2024 EXAM: CTA Head and Neck with Intravenous Contrast. CT Head without Contrast. CLINICAL HISTORY: 53 year old male with neurological deficit, R MCA infarcts on CT and MRI TECHNIQUE: Axial CTA images of the head and neck performed with intravenous contrast. MIP reconstructed images were created and reviewed. Axial computed tomography images of the head/brain performed without intravenous contrast. Note: Per PQRS, the description of internal carotid artery percent stenosis, including 0 percent or normal exam, is based on [employer] American Symptomatic Carotid Endarterectomy Trial (NASCET) criteria. Dose reduction technique was used including one or more of the following: automated exposure control, adjustment  of mA and kV according to patient size, and/or iterative reconstruction. CONTRAST: Without and with; 75 mL iohexol  (OMNIPAQUE ) 350 MG/ML injection. COMPARISON: CT Head 10/23/2024, MRI 10/24/2024 00:09 AM FINDINGS: CT HEAD: BRAIN: Confluent cytotoxic edema in the anterior right MCA territory. Patchy additional operculum hypodensity appears unchanged from the CT 10/23/2024. Diffusion restriction in those areas and scattered elsewhere in the posterior right MCA territory which is occult by CT. No acute intraparenchymal hemorrhage. No mass lesion. No midline shift or extra-axial collection. No new cerebral edema. VENTRICLES: No hydrocephalus. ORBITS: The orbits are unremarkable. SINUSES AND MASTOIDS: The paranasal sinuses, tympanic cavities, and mastoid air cells remain well aerated. CTA NECK: AORTIC ARCH: 3 vessel aortic arch with moderate arch atherosclerosis. Mild proximal right subclavian artery atherosclerosis without stenosis. Mild to moderate proximal left subclavian atherosclerosis without stenosis. COMMON CAROTID ARTERIES: No significant stenosis. No dissection or occlusion. Intermittent atherosclerosis, most pronounced at the right carotid bifurcation. No significant left carotid stenosis. INTERNAL CAROTID ARTERIES: Right ICA: No stenosis by NASCET criteria. No dissection or occlusion. Negative right ICA siphon. Left ICA: Soft and calcified plaque at the origin with less than 50% stenosis. Mild siphon soft and calcified plaque without significant stenosis. No dissection or occlusion. VERTEBRAL ARTERIES: Fairly codominant vertebral arteries. Left vertebral V2 calcified plaque occasionally noted. No left vertebral artery stenosis to the vertebrobasilar junction. Normal left vertebral artery origin. Minimal right vertebral artery V2 segment plaque.  The right vertebral V4 segment is mildly dominant. No right vertebral stenosis. Normal right vertebral artery origin. No dissection or occlusion. CTA HEAD: ANTERIOR  CEREBRAL ARTERIES: Normal ACA origins. No significant stenosis. No occlusion. No aneurysm. MIDDLE CEREBRAL ARTERIES: Normal MCA origins. Right MCA: Near occlusion of the bifurcation or trifurcation (series 13 image 115), but with most of the right MCA M2 branches reconstituted, and less apparent in the other imaging planes. However, the dominant anterior right M2 division is likely occluded, with a paucity of anterior and middle division right MCA branches as seen on series 16 image 23. The right M1 segment proximal to that is patent without stenosis. Normal right anterior temporal artery origin. Left MCA: No significant stenosis. No occlusion. No aneurysm. POSTERIOR CEREBRAL ARTERIES: Mild right PCA P2 segment irregularity without stenosis. Normal PCA origins. No occlusion. No aneurysm. BASILAR ARTERY: No significant stenosis. No occlusion. No aneurysm. OTHER: Diminutive or absent posterior communicating arteries. Normal anterior communicating artery. Normal right PICA origin. Normal SCA origins. Normal PCA origins. SOFT TISSUES: Nonvascular neck soft tissue spaces are within normal limits. Partially visible sequelae of CABG. Mild pulmonary hyperinflation. Aspirated or retained secretions at the carina and in the right mainstem bronchus (series 11 image 183), but no visible lung opacity. BONES: Previous sternotomy. Ordinary cervical spine degeneration. No acute osseous abnormality. INTRACRANIAL VENOUS: Early intracranial venous contrast timing, not well evaluated. IMPRESSION: 1. Near occlusion of the right MCA bi/trifurcation appears related to acute thromboembolic disease (ELVO), but with Reconstituted right MCA branches except for some of the anterior and middle divisions. 2. Other atherosclerosis in the head and neck, but no other hemodynamically significant stenosis. Prior CABG. 3. Unchanged CT appearance of right MCA infarct since yesterday, no hemorrhagic transformation transformation or significant mass  effect. 4. Salient findings communicated by text page to Dr. Vanessa, who called back and we briefly discussed at 05:49 hours on 10/24/2024. Electronically signed by: Helayne Hurst MD 10/24/2024 05:54 AM EST RP Workstation: [id number]   MR Brain W and Wo Contrast Result Date: 10/24/2024 EXAM: MRI BRAIN WITH AND WITHOUT CONTRAST 10/24/2024 12:31:00 AM TECHNIQUE: Multiplanar multisequence MRI of the head/brain was performed with and without the administration of intravenous contrast. COMPARISON: CT head 10/23/2024. CLINICAL HISTORY: Neuro deficit, acute, stroke suspected FINDINGS: BRAIN AND VENTRICLES: Acute infarct in the right frontal lobe with smaller acute infarcts in the more posterior right frontal and parietal lobes. No midline shift. No acute intracranial hemorrhage. No hydrocephalus. The sella is unremarkable. Normal flow voids. No mass or abnormal enhancement. ORBITS: No acute abnormality. SINUSES: No acute abnormality. BONES AND SOFT TISSUES: Normal bone marrow signal and enhancement. No acute soft tissue abnormality. IMPRESSION: 1. Acute infarct in the right frontal lobe with smaller acute infarcts in the more posterior right frontal and parietal lobes. Electronically signed by: Gilmore Molt 10/24/2024 12:50 AM EST RP Workstation: [id number]   CT HEAD WO CONTRAST Addendum Date: 10/23/2024 ADDENDUM #1 ADDENDUM: These findings were discussed with Dr. Bari over the phone by Dr. Margarite on 10/23/24 at 9:07 pm ---------------------------------------------------- Electronically signed by: Kate Margarite MD 10/23/2024 09:08 PM EST RP Workstation: [id number]   Result Date: 10/23/2024 ORIGINAL REPORT EXAM: CT HEAD WITHOUT CONTRAST 10/23/2024 08:12:13 PM TECHNIQUE: CT of the head was performed without the administration of intravenous contrast. Automated exposure control, iterative reconstruction, and/or weight based adjustment of the mA/kV was utilized to reduce the radiation dose to as low as  reasonably achievable. COMPARISON: None available. CLINICAL HISTORY: Neuro deficit, acute, stroke suspected. FINDINGS: BRAIN  AND VENTRICLES: No acute hemorrhage. Loss of gray-white matter differentiation of the right frontal lobe with associated edema. No hydrocephalus. No extra-axial collection. No midline shift. ORBITS: No acute abnormality. SINUSES: No acute abnormality. SOFT TISSUES AND SKULL: No acute soft tissue abnormality. No skull fracture. IMPRESSION: 1. 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 2. No intracranial hemorrhage. Electronically signed by: Kate Plummer MD 10/23/2024 09:03 PM EST RP Workstation: [id number]    Vitals:   10/24/24 0900 10/24/24 1000 10/24/24 1035 10/24/24 1100  BP: 121/62 121/75  127/65  Pulse: (!) 48 (!) 46  (!) 43  Resp: 17 17  19   Temp:   98.2 F (36.8 C)   TempSrc:   Oral   SpO2: 95% 95%  98%  Weight:      Height:         PHYSICAL EXAM General:  Alert, well-nourished, well-developed patient in no acute distress Psych:  Mood and affect appropriate for situation CV: Regular rate and rhythm on monitor Respiratory:  Regular, unlabored respirations on room air GI: Abdomen soft and nontender   NEURO:  Mental Status: Spanish-speaking, AA&Ox3, patient is able to give clear and coherent history Speech/Language: speech is without dysarthria or aphasia.  Naming, repetition, fluency, and comprehension intact.  Cranial Nerves:  II: PERRL. Visual fields full.  III, IV, VI: EOMI. Eyelids elevate symmetrically.  V: Sensation is intact to light touch and symmetrical to face.  VII: Slight left nasolabial flattening VIII: hearing intact to voice. IX, X: Palate elevates symmetrically. Phonation is normal.  KP:Dynloizm shrug 5/5. XII: tongue is midline without fasciculations. Motor: 5/5 strength to all muscle groups tested.  Tone: is normal and bulk is normal Sensation- Intact to light touch bilaterally. Extinction absent to light touch to DSS.    Coordination: FTN intact bilaterally, HKS: no ataxia in BLE.No drift.  Gait- deferred      ASSESSMENT/PLAN  Isaac Ray is a 53 y.o. male with hx of CAD s/p CABG, HTN, DM2, HLD who woke up 2 days ago with L sided weakness. CT head revealed acute/subacute right frontal lobe infarct.  MRI brain showed right M2 territory infarct.  Vessel imaging showed right M1 occlusion/high-grade stenosis distal opacification.  No significant, flow-limiting atherosclerotic disease appreciated in the proximal carotids.  Etiology of stroke is cryptogenic at this time.  Will need full cardioembolic workup with 2D echo and Zio patch for 30 days on discharge.  Reasonable to continue DAPT with aspirin  81 mg Plavix  75 mg daily for 3 weeks, followed by aspirin  alone.  Etiology: Cryptogenic, suspect cardioembolic DAPT with aspirin  81 mg Plavix  75 mg daily for 3 weeks, followed by aspirin  alone. 30-day Zio patch on discharge. 2D Echo pending LDL 102, goal <70, continue atorvastatin  80 mg daily. HgbA1c 8.6 (goal <7) VTE prophylaxis -okay for chemical DVT prophy. Recommend PT, OT, SLP Disposition: Pending further workup Discussed importance of tobacco cessation. Outpatient vascular neurology follow-up    Thedford Homans, MD Vascular Neurology   Hospital day # 1    To contact Stroke Continuity provider, please refer to [email or web address]. After hours, contact General Neurology

## 2024-10-24 NOTE — Plan of Care (Signed)
   Problem: Education: Goal: Knowledge of disease or condition will improve Outcome: Progressing

## 2024-10-24 NOTE — ED Notes (Signed)
 PT at bedside.

## 2024-10-25 DIAGNOSIS — I25810 Atherosclerosis of coronary artery bypass graft(s) without angina pectoris: Secondary | ICD-10-CM

## 2024-10-25 DIAGNOSIS — I110 Hypertensive heart disease with heart failure: Secondary | ICD-10-CM

## 2024-10-25 DIAGNOSIS — I255 Ischemic cardiomyopathy: Secondary | ICD-10-CM

## 2024-10-25 DIAGNOSIS — I5020 Unspecified systolic (congestive) heart failure: Secondary | ICD-10-CM

## 2024-10-25 DIAGNOSIS — I639 Cerebral infarction, unspecified: Secondary | ICD-10-CM

## 2024-10-25 DIAGNOSIS — I5022 Chronic systolic (congestive) heart failure: Secondary | ICD-10-CM

## 2024-10-25 LAB — GLUCOSE, CAPILLARY
Glucose-Capillary: 138 mg/dL — ABNORMAL HIGH (ref 70–99)
Glucose-Capillary: 218 mg/dL — ABNORMAL HIGH (ref 70–99)
Glucose-Capillary: 194 mg/dL — ABNORMAL HIGH (ref 70–99)
Glucose-Capillary: 139 mg/dL — ABNORMAL HIGH (ref 70–99)

## 2024-10-25 MED ORDER — SACUBITRIL-VALSARTAN 24-26 MG PO TABS
1.0000 | ORAL_TABLET | Freq: Two times a day (BID) | ORAL | Status: DC
  Administered 2024-10-25 – 2024-10-27 (×4): 1 via ORAL
  Filled 2024-10-25: qty 1

## 2024-10-25 MED ORDER — GUAIFENESIN-DM 100-10 MG/5ML PO SYRP
5.0000 mL | ORAL_SOLUTION | ORAL | Status: DC | PRN
  Administered 2024-10-25: 5 mL via ORAL
  Filled 2024-10-25: qty 5

## 2024-10-25 MED ORDER — SPIRONOLACTONE 25 MG PO TABS
25.0000 mg | ORAL_TABLET | Freq: Every day | ORAL | Status: DC
  Administered 2024-10-26: 25 mg via ORAL
  Filled 2024-10-25: qty 1

## 2024-10-25 NOTE — Progress Notes (Signed)
 Physical Therapy Treatment Patient Details Name: Isaac Ray MRN: [id number] DOB: 09-Feb-1971 Today's Date: 10/25/2024   History of Present Illness 53 y.o. male presented 12/26 after waking up on 10/21/2024 with L sided weakness. Dropping things from L hand, walking is off and wife has noted L facial droop. MRI brain shows acute infarct in right frontal lobe with smaller acute infarcts in the right more posterior right frontal and parietal lobes. PMH: of CAD s/p CABG, HTN, DM2, HLD.    PT Comments  Pt progressing well. Pt with improved overall stability with ambulation however continues to demo L sided inattention as he continues to vear to the L and bump into objects on L frequently. Pt able to make adjustments after the fact. Focused on higher level balance this date. Pt to continue to benefit from outpt neuro rehab upon d/c to address balance deficits, L sided weakness, and L inattention. Acute PT to cont to follow.   If plan is discharge home, recommend the following: Assist for transportation;Help with stairs or ramp for entrance   Can travel by private vehicle        Equipment Recommendations       Recommendations for Other Services       Precautions / Restrictions Precautions Precautions: Fall Recall of Precautions/Restrictions: Intact Precaution/Restrictions Comments: left inattention Restrictions Weight Bearing Restrictions Per Provider Order: No     Mobility  Bed Mobility Overal bed mobility: Independent                  Transfers Overall transfer level: Modified independent Equipment used: None               General transfer comment: Slow to rise, guarded but stable with equal weight distribution    Ambulation/Gait Ambulation/Gait assistance: Supervision Gait Distance (Feet): 300 Feet Assistive device: None Gait Pattern/deviations: Step-through pattern, Decreased stride length, Drifts right/left Gait velocity: dec     General Gait  Details: Minor drift towards left, able to correct with VC for awareness or post running into objects, no episode of LOB   Stairs Stairs: Yes Stairs assistance: Contact guard assist Stair Management: One rail Left, Alternating pattern, Step to pattern Number of Stairs: 12 General stair comments: acended reciprocally, descended step to leading with L LE, holding L hand rail   Wheelchair Mobility     Tilt Bed    Modified Rankin (Stroke Patients Only) Modified Rankin (Stroke Patients Only) Pre-Morbid Rankin Score: No symptoms Modified Rankin: Moderate disability     Balance Overall balance assessment: Mild deficits observed, not formally tested                                          Communication Communication Communication: Impaired Factors Affecting Communication: Non - English speaking, interpreter not available  Cognition Arousal: Alert Behavior During Therapy: WFL for tasks assessed/performed                           PT - Cognition Comments: L inattention Following commands: Intact      Cueing Cueing Techniques: Verbal cues  Exercises Other Exercises Other Exercises: amb backwards with contact guard x 20' x2 Other Exercises: completed side stepping to the L and to the R 20' x2 sets Other Exercises: worked on tandem walking however requiring minA or support of hallway rail, 10'x3    General  Comments General comments (skin integrity, edema, etc.): HR increased to 89bpm from 42 bpm at rest while amb      Pertinent Vitals/Pain Pain Assessment Pain Assessment: No/denies pain    Home Living                          Prior Function            PT Goals (current goals can now be found in the care plan section) Acute Rehab PT Goals Patient Stated Goal: Return home PT Goal Formulation: With patient Time For Goal Achievement: 11/06/24 Potential to Achieve Goals: Good Progress towards PT goals: Progressing toward  goals    Frequency    Min 2X/week      PT Plan      Co-evaluation              AM-PAC PT 6 Clicks Mobility   Outcome Measure  Help needed turning from your back to your side while in a flat bed without using bedrails?: None Help needed moving from lying on your back to sitting on the side of a flat bed without using bedrails?: None Help needed moving to and from a bed to a chair (including a wheelchair)?: None Help needed standing up from a chair using your arms (e.g., wheelchair or bedside chair)?: None Help needed to walk in hospital room?: A Little Help needed climbing 3-5 steps with a railing? : A Little 6 Click Score: 22    End of Session Equipment Utilized During Treatment: Gait belt Activity Tolerance: Patient tolerated treatment well Patient left: in bed;with call bell/phone within reach;with family/visitor present Nurse Communication: Mobility status PT Visit Diagnosis: Unsteadiness on feet (R26.81);Other abnormalities of gait and mobility (R26.89);Hemiplegia and hemiparesis;Difficulty in walking, not elsewhere classified (R26.2) Hemiplegia - Right/Left: Left Hemiplegia - dominant/non-dominant: Non-dominant Hemiplegia - caused by: Cerebral infarction     Time: 0930-0950 PT Time Calculation (min) (ACUTE ONLY): 20 min  Charges:    $Gait Training: 8-22 mins PT General Charges $$ ACUTE PT VISIT: 1 Visit                     Norene Ames, PT, DPT Acute Rehabilitation Services Secure chat preferred Office #: [phone]    Norene CHRISTELLA Ames 10/25/2024, 9:56 AM

## 2024-10-25 NOTE — Plan of Care (Signed)
   Problem: Education: Goal: Knowledge of disease or condition will improve Outcome: Progressing   Problem: Ischemic Stroke/TIA Tissue Perfusion: Goal: Complications of ischemic stroke/TIA will be minimized Outcome: Progressing

## 2024-10-25 NOTE — Progress Notes (Signed)
 " PROGRESS NOTE    Isaac Ray  FMW:[id number] DOB: 09/07/1971 DOA: 10/23/2024 PCP: Delbert Clam, MD   Chief Complaint  Patient presents with   Weakness   Facial Droop    Brief Narrative:   Isaac Ray is a 53 y.o. male with hx of CAD s/p CABG, HTN, DM2, HLD who woke up on 10/21/2024 with L sided weakness. Dropping things from L hand, walking is off and wife has noted L facial droop. He was coming back from [place] and did not think too much about it. Symptoms persistent so he came to the ED.   He denies any prior hx of stroke, mother had strokes. Does not use any recreational substances.  Denies any prior history of atrial fibrillation.  Diagnosed with a stroke.  Admitted under hospital service, neurology following.   Assessment & Plan:   Principal Problem:   CVA (cerebral vascular accident) (HCC)   Acute CVA - Source most likely cryptogenic in the setting of low EF 20% with significant regional wall motion abnormalities with history of LV thrombus noncompliant with medication, stopped taking his warfarin 2 years ago with poor follow-up when he traveled to [place]. - Recommendation for dual antiplatelet therapy, but now with today, recommendation for full anticoagulation. - On aspirin  and Plavix , will transition to Eliquis  in a.m. (versus warfarin if cost is significant) discussed with both neurology and cardiology.  At this point no need for TEE as recommendation for full anticoagulation regardless to TEE  finding as discussed with both neurology and cardiology. - PT/OT/SLP consulted. - LDL is 102, continue with atorvastatin  80 mg daily. - A1c is 8.6  Diabetes mellitus, type II, poorly controlled with A1c of 8.6 -On glipizide  and metformin  at home. - Currently on insulin  sliding scale and Lantus   History of CAD status post CABG Chronic systolic heart failure Ischemic cardiomyopathy with EF 20 to 25% - Cardiology input greatly appreciated, currently  on dual antiplatelet therapy, but will transition to full anticoagulation tomorrow, please see above discussion - Continue with Entresto  and Aldactone .  Discussed with renal current goal is normotension and no need for permissive hypertension.  Hyperlipidemia - LDL 102, continue with statin  Sinus bradycardia  Patient asymptomatic and not on any rate control medications.  Monitor for now. Check TSH  DVT prophylaxis: (SCD) Code Status: (Full) Family Communication: (wife at bedside) Disposition:   Status is: Inpatient    Consultants:  Neurology cardiology   Subjective:  Patient reports weakness much improved  Objective: Vitals:   10/25/24 0000 10/25/24 0657 10/25/24 0753 10/25/24 1213  BP: (!) 106/50 121/65 124/64 (!) 113/53  Pulse:  (!) 50 (!) 55 (!) 52  Resp:  13 17 19   Temp:  98.2 F (36.8 C) 98 F (36.7 C) 98.4 F (36.9 C)  TempSrc:  Oral Oral Oral  SpO2:  92% 98%   Weight:      Height:        Intake/Output Summary (Last 24 hours) at 10/25/2024 1322 Last data filed at 10/25/2024 1100 Gross per 24 hour  Intake 598 ml  Output --  Net 598 ml   Filed Weights   10/23/24 2141  Weight: 74.8 kg    Examination:  Awake Alert, Oriented X 3, Symmetrical Chest wall movement, Good air movement bilaterally, CTAB RRR,No Gallops,Rubs or new Murmurs, No Parasternal Heave +ve B.Sounds, Abd Soft, No tenderness, No rebound - guarding or rigidity. No Cyanosis, Clubbing or edema, No new Rash or bruise  Data Reviewed: I have personally reviewed following labs and imaging studies  CBC: Recent Labs  Lab 10/23/24 1954 10/23/24 1957  WBC  --  10.1  NEUTROABS  --  5.2  HGB 16.3 16.4  HCT 48.0 47.7  MCV  --  92.1  PLT  --  225    Basic Metabolic Panel: Recent Labs  Lab 10/23/24 1954 10/23/24 1957  NA 139 137  K 4.0 4.2  CL 105 103  CO2  --  23  GLUCOSE 147* 147*  BUN 22* 21*  CREATININE 1.50* 1.45*  CALCIUM   --  10.0    GFR: Estimated Creatinine  Clearance: 53.8 mL/min (A) (by C-G formula based on SCr of 1.45 mg/dL (H)).  Liver Function Tests: Recent Labs  Lab 10/23/24 1957  AST 19  ALT 15  ALKPHOS 70  BILITOT 0.5  PROT 7.9  ALBUMIN  4.5    CBG: Recent Labs  Lab 10/24/24 1236 10/24/24 1710 10/24/24 2045 10/25/24 0754 10/25/24 1210  GLUCAP 117* 112* 120* 138* 218*     No results found for this or any previous visit (from the past 240 hours).       Radiology Studies: ECHOCARDIOGRAM COMPLETE Result Date: 10/24/2024    ECHOCARDIOGRAM REPORT   Patient Name:   Isaac Ray Date of Exam: 10/24/2024 Medical Rec #:  [id number]               Height:       67.0 in Accession #:    [id number]              Weight:       164.9 lb Date of Birth:  [birth year]              BSA:          1.863 m Patient Age:    53 years                BP:           121/75 mmHg Patient Gender: M                       HR:           45 bpm. Exam Location:  Inpatient Procedure: 2D Echo, Color Doppler, Cardiac Doppler and Intracardiac            Opacification Agent (Both Spectral and Color Flow Doppler were            utilized during procedure). Indications:    Stroke I63.9  History:        Patient has prior history of Echocardiogram examinations, most                 recent 03/14/2021. CAD, Signs/Symptoms:Hypertensive Heart                 Disease; Risk Factors:Diabetes.  Sonographer:    Sydnee Wilson RDCS Referring Phys: [id number] ROBERT DORRELL IMPRESSIONS  1. Left ventricular ejection fraction, by estimation, is 20 to 25%. The left ventricle has severely decreased function. The left ventricle demonstrates regional wall motion abnormalities (see scoring diagram/findings for description). The left ventricular internal cavity size was severely dilated. Left ventricular diastolic parameters are consistent with Grade I diastolic dysfunction (impaired relaxation).  2. Right ventricular systolic function is low normal. The right ventricular size is not well  visualized.  3. The mitral valve is normal in structure. No evidence of mitral valve regurgitation. No evidence of mitral  stenosis.  4. The aortic valve is tricuspid. Aortic valve regurgitation is not visualized. Comparison(s): Prior images reviewed side by side. LVEF has further decreased. Conclusion(s)/Recommendation(s): No LV thrombus is appreciated on images by substrate for LV thrombus exists. FINDINGS  Left Ventricle: Left ventricular ejection fraction, by estimation, is 20 to 25%. The left ventricle has severely decreased function. The left ventricle demonstrates regional wall motion abnormalities. The left ventricular internal cavity size was severely dilated. There is no left ventricular hypertrophy. Left ventricular diastolic parameters are consistent with Grade I diastolic dysfunction (impaired relaxation).  LV Wall Scoring: The entire anterior wall, antero-lateral wall, entire anterior septum, apical lateral segment, and apical inferior segment are akinetic. The inferior wall, posterior wall, mid inferoseptal segment, and basal inferoseptal segment are hypokinetic. Right Ventricle: The right ventricular size is not well visualized. No increase in right ventricular wall thickness. Right ventricular systolic function is low normal. Left Atrium: Left atrial size was normal in size. Right Atrium: Right atrial size was normal in size. Pericardium: There is no evidence of pericardial effusion. Mitral Valve: The mitral valve is normal in structure. No evidence of mitral valve regurgitation. No evidence of mitral valve stenosis. Tricuspid Valve: The tricuspid valve is normal in structure. Tricuspid valve regurgitation is not demonstrated. No evidence of tricuspid stenosis. Aortic Valve: The aortic valve is tricuspid. Aortic valve regurgitation is not visualized. Aortic valve mean gradient measures 8.0 mmHg. Aortic valve peak gradient measures 15.7 mmHg. Aortic valve area, by VTI measures 2.43 cm. Pulmonic  Valve: The pulmonic valve was normal in structure. Pulmonic valve regurgitation is not visualized. No evidence of pulmonic stenosis. Aorta: The aortic root and ascending aorta are structurally normal, with no evidence of dilitation. IAS/Shunts: No atrial level shunt detected by color flow Doppler.  LEFT VENTRICLE PLAX 2D LVIDd:         6.80 cm      Diastology LVIDs:         6.30 cm      LV e' medial:    5.77 cm/s LV PW:         1.10 cm      LV E/e' medial:  12.5 LV IVS:        1.00 cm      LV e' lateral:   7.29 cm/s LVOT diam:     2.00 cm      LV E/e' lateral: 9.9 LV SV:         90 LV SV Index:   48 LVOT Area:     3.14 cm  LV Volumes (MOD) LV vol d, MOD A2C: 288.0 ml LV vol d, MOD A4C: 273.0 ml LV vol s, MOD A2C: 242.0 ml LV vol s, MOD A4C: 211.0 ml LV SV MOD A2C:     46.0 ml LV SV MOD A4C:     273.0 ml LV SV MOD BP:      57.6 ml RIGHT VENTRICLE RV S prime:     10.20 cm/s TAPSE (M-mode): 2.1 cm LEFT ATRIUM             Index        RIGHT ATRIUM           Index LA diam:        4.00 cm 2.15 cm/m   RA Area:     13.80 cm LA Vol (A2C):   25.2 ml 13.53 ml/m  RA Volume:   25.60 ml  13.74 ml/m LA Vol (A4C):   37.0 ml 19.86  ml/m LA Biplane Vol: 30.6 ml 16.42 ml/m  AORTIC VALVE AV Area (Vmax):    2.59 cm AV Area (Vmean):   2.22 cm AV Area (VTI):     2.43 cm AV Vmax:           198.00 cm/s AV Vmean:          126.000 cm/s AV VTI:            0.371 m AV Peak Grad:      15.7 mmHg AV Mean Grad:      8.0 mmHg LVOT Vmax:         163.00 cm/s LVOT Vmean:        89.000 cm/s LVOT VTI:          0.287 m LVOT/AV VTI ratio: 0.77  AORTA Ao Root diam: 2.80 cm Ao Asc diam:  2.50 cm MITRAL VALVE MV Area (PHT): 1.71 cm     SHUNTS MV Decel Time: 443 msec     Systemic VTI:  0.29 m MV E velocity: 72.30 cm/s   Systemic Diam: 2.00 cm MV A velocity: 105.00 cm/s MV E/A ratio:  0.69 Stanly Leavens MD Electronically signed by Stanly Leavens MD Signature Date/Time: 10/24/2024/12:39:30 PM    Final    CT ANGIO HEAD NECK W WO CM (CODE  STROKE) Result Date: 10/24/2024 EXAM: CTA Head and Neck with Intravenous Contrast. CT Head without Contrast. CLINICAL HISTORY: 53 year old male with neurological deficit, R MCA infarcts on CT and MRI TECHNIQUE: Axial CTA images of the head and neck performed with intravenous contrast. MIP reconstructed images were created and reviewed. Axial computed tomography images of the head/brain performed without intravenous contrast. Note: Per PQRS, the description of internal carotid artery percent stenosis, including 0 percent or normal exam, is based on [employer] Symptomatic Carotid Endarterectomy Trial (NASCET) criteria. Dose reduction technique was used including one or more of the following: automated exposure control, adjustment of mA and kV according to patient size, and/or iterative reconstruction. CONTRAST: Without and with; 75 mL iohexol  (OMNIPAQUE ) 350 MG/ML injection. COMPARISON: CT Head 10/23/2024, MRI 10/24/2024 00:09 AM FINDINGS: CT HEAD: BRAIN: Confluent cytotoxic edema in the anterior right MCA territory. Patchy additional operculum hypodensity appears unchanged from the CT 10/23/2024. Diffusion restriction in those areas and scattered elsewhere in the posterior right MCA territory which is occult by CT. No acute intraparenchymal hemorrhage. No mass lesion. No midline shift or extra-axial collection. No new cerebral edema. VENTRICLES: No hydrocephalus. ORBITS: The orbits are unremarkable. SINUSES AND MASTOIDS: The paranasal sinuses, tympanic cavities, and mastoid air cells remain well aerated. CTA NECK: AORTIC ARCH: 3 vessel aortic arch with moderate arch atherosclerosis. Mild proximal right subclavian artery atherosclerosis without stenosis. Mild to moderate proximal left subclavian atherosclerosis without stenosis. COMMON CAROTID ARTERIES: No significant stenosis. No dissection or occlusion. Intermittent atherosclerosis, most pronounced at the right carotid bifurcation. No significant left carotid  stenosis. INTERNAL CAROTID ARTERIES: Right ICA: No stenosis by NASCET criteria. No dissection or occlusion. Negative right ICA siphon. Left ICA: Soft and calcified plaque at the origin with less than 50% stenosis. Mild siphon soft and calcified plaque without significant stenosis. No dissection or occlusion. VERTEBRAL ARTERIES: Fairly codominant vertebral arteries. Left vertebral V2 calcified plaque occasionally noted. No left vertebral artery stenosis to the vertebrobasilar junction. Normal left vertebral artery origin. Minimal right vertebral artery V2 segment plaque. The right vertebral V4 segment is mildly dominant. No right vertebral stenosis. Normal right vertebral artery origin. No dissection or occlusion. CTA HEAD: ANTERIOR  CEREBRAL ARTERIES: Normal ACA origins. No significant stenosis. No occlusion. No aneurysm. MIDDLE CEREBRAL ARTERIES: Normal MCA origins. Right MCA: Near occlusion of the bifurcation or trifurcation (series 13 image 115), but with most of the right MCA M2 branches reconstituted, and less apparent in the other imaging planes. However, the dominant anterior right M2 division is likely occluded, with a paucity of anterior and middle division right MCA branches as seen on series 16 image 23. The right M1 segment proximal to that is patent without stenosis. Normal right anterior temporal artery origin. Left MCA: No significant stenosis. No occlusion. No aneurysm. POSTERIOR CEREBRAL ARTERIES: Mild right PCA P2 segment irregularity without stenosis. Normal PCA origins. No occlusion. No aneurysm. BASILAR ARTERY: No significant stenosis. No occlusion. No aneurysm. OTHER: Diminutive or absent posterior communicating arteries. Normal anterior communicating artery. Normal right PICA origin. Normal SCA origins. Normal PCA origins. SOFT TISSUES: Nonvascular neck soft tissue spaces are within normal limits. Partially visible sequelae of CABG. Mild pulmonary hyperinflation. Aspirated or retained  secretions at the carina and in the right mainstem bronchus (series 11 image 183), but no visible lung opacity. BONES: Previous sternotomy. Ordinary cervical spine degeneration. No acute osseous abnormality. INTRACRANIAL VENOUS: Early intracranial venous contrast timing, not well evaluated. IMPRESSION: 1. Near occlusion of the right MCA bi/trifurcation appears related to acute thromboembolic disease (ELVO), but with Reconstituted right MCA branches except for some of the anterior and middle divisions. 2. Other atherosclerosis in the head and neck, but no other hemodynamically significant stenosis. Prior CABG. 3. Unchanged CT appearance of right MCA infarct since yesterday, no hemorrhagic transformation transformation or significant mass effect. 4. Salient findings communicated by text page to Dr. Vanessa, who called back and we briefly discussed at 05:49 hours on 10/24/2024. Electronically signed by: Helayne Hurst MD 10/24/2024 05:54 AM EST RP Workstation: [id number]   MR Brain W and Wo Contrast Result Date: 10/24/2024 EXAM: MRI BRAIN WITH AND WITHOUT CONTRAST 10/24/2024 12:31:00 AM TECHNIQUE: Multiplanar multisequence MRI of the head/brain was performed with and without the administration of intravenous contrast. COMPARISON: CT head 10/23/2024. CLINICAL HISTORY: Neuro deficit, acute, stroke suspected FINDINGS: BRAIN AND VENTRICLES: Acute infarct in the right frontal lobe with smaller acute infarcts in the more posterior right frontal and parietal lobes. No midline shift. No acute intracranial hemorrhage. No hydrocephalus. The sella is unremarkable. Normal flow voids. No mass or abnormal enhancement. ORBITS: No acute abnormality. SINUSES: No acute abnormality. BONES AND SOFT TISSUES: Normal bone marrow signal and enhancement. No acute soft tissue abnormality. IMPRESSION: 1. Acute infarct in the right frontal lobe with smaller acute infarcts in the more posterior right frontal and parietal lobes. Electronically  signed by: Gilmore Molt 10/24/2024 12:50 AM EST RP Workstation: [id number]   CT HEAD WO CONTRAST EXAM: CT HEAD WITHOUT CONTRAST 10/23/2024 08:12:13 PM TECHNIQUE: CT of the head was performed without the administration of intravenous contrast. Automated exposure control, iterative reconstruction, and/or weight based adjustment of the mA/kV was utilized to reduce the radiation dose to as low as reasonably achievable. COMPARISON: None available. CLINICAL HISTORY: Neuro deficit, acute, stroke suspected. FINDINGS: BRAIN AND VENTRICLES: No acute hemorrhage. Loss of gray-white matter differentiation of the right frontal lobe with associated edema. No hydrocephalus. No extra-axial collection. No midline shift. ORBITS: No acute abnormality. SINUSES: No acute abnormality. SOFT TISSUES AND SKULL: No acute soft tissue abnormality. No skull fracture. IMPRESSION: 1. 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 2. No intracranial hemorrhage. Electronically signed by: Morgane Naveau MD 10/23/2024 09:03 PM EST RP Workstation:  [id number]        Scheduled Meds:  aspirin  EC  81 mg Oral Daily   atorvastatin   80 mg Oral Daily   clopidogrel   75 mg Oral Daily   feeding supplement  237 mL Oral BID BM   influenza vac split trivalent PF  0.5 mL Intramuscular Tomorrow-1000   insulin  aspart  0-15 Units Subcutaneous TID WC   insulin  aspart  0-5 Units Subcutaneous QHS   insulin  glargine  10 Units Subcutaneous Daily   pneumococcal 20-valent conjugate vaccine  0.5 mL Intramuscular Tomorrow-1000   Continuous Infusions:   LOS: 2 days     Brayton Lye, MD [facility]   To contact the attending provider between 7A-7P or the covering provider during after hours 7P-7A, please log into the web site [email or web address] and access using universal [place] password for that web site. If you do not have the password, please call the hospital operator.  10/25/2024, 1:22 PM   "

## 2024-10-25 NOTE — Consult Note (Signed)
 "   CARDIOLOGY CONSULT NOTE    Patient ID: Isaac Ray; [id number]; [birth year]   Admit date: 10/23/2024 Date of Consult: 10/25/2024  Primary Care Provider: Delbert Clam, MD Primary Cardiologist:  Primary Electrophysiologist:     Patient Profile:   Isaac Ray is a 53 y.o. male with a hx of CAD who is being seen today for the evaluation of chronic systolic heart failure at the request of  Isaac Ray.  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  Neurology on board, recommended DAPT for 3 weeks followed by aspirin  81 mg once daily.  However, they have a low threshold to start Eliquis  5 mg twice daily due to low LVEF.  Echocardiogram this admission showed LVEF 20 to 25% with RWMA, severe LV dilatation, low normal RV function,, IVC not well-visualized.  Patient is seen and examined at the bedside.  Does not have any angina or DOE.  Very active at baseline.  Angiogram from [place] recently.  He currently is taking Entresto  24-26 mg twice daily and spironolactone  25 mg once daily at home.  He does not know who is refilling his medications for him.  Not on beta-blocker due to chronic sinus bradycardia cardia.  No dizziness, syncope, palpitations, leg swelling.  Past Medical History:  Diagnosis Date   CAD (coronary artery disease)    Essential hypertension    Heart murmur    when I was a teenager- no mention of it now   Hyperlipidemia    LV (left ventricular) mural thrombus without MI (HCC) 04/27/2019   Type 2 diabetes mellitus (HCC)    Type II    Past Surgical History:  Procedure Laterality Date   AMPUTATION Left 04/08/2019   Procedure: LEFT FOOT 4TH TOE  AMPUTATION;  Surgeon: Isaac Jerona GAILS, MD;  Location: [facility] OR;  Service: Orthopedics;  Laterality: Left;   CORONARY ANGIOPLASTY WITH STENT PLACEMENT  2013   CORONARY ARTERY BYPASS GRAFT N/A 02/16/2015   Procedure: CORONARY ARTERY BYPASS GRAFTING (CABG) x5 using left internal mammary artery, right thigh greater saphenous vein, and left radial artery. ;  Surgeon: Isaac JAYSON Millers, MD;  Location: [facility] OR;  Service: Open Heart Surgery;  Laterality: N/A;   LAPAROSCOPIC APPENDECTOMY N/A 04/25/2019   Procedure: APPENDECTOMY LAPAROSCOPIC;  Surgeon: Kinsinger, Herlene Righter, MD;  Location: [facility] OR;  Service: General;  Laterality: N/A;   LEFT HEART CATHETERIZATION WITH CORONARY ANGIOGRAM N/A 02/14/2015   Procedure: LEFT HEART CATHETERIZATION WITH CORONARY ANGIOGRAM;  Surgeon: Isaac Ray Lesches, MD;  Location: [facility] CATH LAB;  Service: Cardiovascular;  Laterality: N/A;   LOWER EXTREMITY ANGIOGRAPHY N/A 03/30/2019   Procedure: LOWER EXTREMITY ANGIOGRAPHY;  Surgeon: Ray Isaac JINNY, MD;  Location: [facility] INVASIVE CV LAB;  Service: Cardiovascular;  Laterality: N/A;   RADIAL ARTERY HARVEST Left 02/16/2015   Procedure: RADIAL ARTERY HARVEST;  Surgeon: Isaac JAYSON Millers, MD;  Location: [facility] OR;  Service: Open Heart Surgery;  Laterality: Left;   TEE WITHOUT CARDIOVERSION N/A 02/16/2015   Procedure: TRANSESOPHAGEAL ECHOCARDIOGRAM (TEE);  Surgeon: Isaac JAYSON Millers, MD;  Location: [facility] OR;  Service: Open Heart Surgery;  Laterality: N/A;       Inpatient Medications: Scheduled Meds:  aspirin  EC  81  mg Oral Daily   atorvastatin   80 mg Oral Daily   clopidogrel   75 mg Oral Daily   feeding supplement  237 mL Oral BID BM   influenza vac split trivalent PF  0.5 mL Intramuscular Tomorrow-1000   insulin  aspart  0-15 Units Subcutaneous TID WC   insulin  aspart  0-5 Units Subcutaneous QHS   insulin  glargine  10 Units Subcutaneous Daily   pneumococcal 20-valent conjugate vaccine  0.5 mL Intramuscular Tomorrow-1000   Continuous  Infusions:  PRN Meds: acetaminophen  **OR** acetaminophen , ondansetron  **OR** ondansetron  (ZOFRAN ) IV  Allergies:   Allergies[1]  Social History:   Social History   Socioeconomic History   Marital status: Single    Spouse name: Not on file   Number of children: Not on file   Years of education: Not on file   Highest education level: Not on file  Occupational History   Not on file  Tobacco Use   Smoking status: Former    Current packs/day: 0.00    Types: Cigarettes    Start date: 02/15/1985    Quit date: 02/16/2015    Years since quitting: 9.6   Smokeless tobacco: Never  Vaping Use   Vaping status: Never Used  Substance and Sexual Activity   Alcohol use: No    Alcohol/week: 0.0 standard drinks of alcohol   Drug use: No   Sexual activity: Not Currently  Other Topics Concern   Not on file  Social History Narrative   Not on file   Social Drivers of Health   Tobacco Use: Medium Risk (10/23/2024)   Patient History    Smoking Tobacco Use: Former    Smokeless Tobacco Use: Never    Passive Exposure: Not on Actuary Strain: Not on file  Food Insecurity: No Food Insecurity (10/24/2024)   Epic    Worried About Programme Researcher, Broadcasting/film/video in the Last Year: Never true    Ran Out of Food in the Last Year: Never true  Transportation Needs: No Transportation Needs (10/24/2024)   Epic    Lack of Transportation (Medical): No    Lack of Transportation (Non-Medical): No  Physical Activity: Not on file  Stress: Not on file  Social Connections: Not on file  Intimate Partner Violence: Unknown (10/24/2024)   Epic    Fear of Current or Ex-Partner: Patient unable to answer    Emotionally Abused: Not on file    Physically Abused: Not on file    Sexually Abused: Not on file  Depression (EYV7-0): Not on file  Alcohol Screen: Not on file  Housing: High Risk (10/24/2024)   Epic    Unable to Pay for Housing in the Last Year: No    Number of Times Moved in the Last Year: 1     Homeless in the Last Year: Yes  Utilities: Not At Risk (10/24/2024)   Epic    Threatened with loss of utilities: No  Health Literacy: Not on file    Family History:    Family History  Problem Relation Age of Onset   Coronary artery disease Father        had CABG   Heart disease Father      ROS:  Please see the history of present illness.  ROS  All other ROS reviewed and negative.     Physical Exam/Data:   Vitals:   10/24/24 2311 10/25/24 0000 10/25/24 0657 10/25/24 0753  BP: (!) 145/62 (!) 106/50 121/65 124/64  Pulse: (!) 51  ROLLEN)  50 (!) 55  Resp: 19  13 17   Temp: 98.4 F (36.9 C)  98.2 F (36.8 C) 98 F (36.7 C)  TempSrc: Oral  Oral Oral  SpO2:   92% 98%  Weight:      Height:        Intake/Output Summary (Last 24 hours) at 10/25/2024 1122 Last data filed at 10/25/2024 1100 Gross per 24 hour  Intake 598 ml  Output --  Net 598 ml   Filed Weights   10/23/24 2141  Weight: 74.8 kg   Body mass index is 25.83 kg/m.  General:  Well nourished, well developed, in no acute distress HEENT: normal Lymph: no adenopathy Neck: no JVD Endocrine:  No thryomegaly Vascular: No carotid bruits; FA pulses 2+ bilaterally without bruits  Cardiac:  normal S1, S2; RRR; no murmur  Lungs:  clear to auscultation bilaterally, no wheezing, rhonchi or rales  Abd: soft, nontender, no hepatomegaly  Ext: no edema Musculoskeletal:  No deformities, BUE and BLE strength normal and equal Skin: warm and dry  Neuro:  CNs 2-12 intact, no focal abnormalities noted Psych:  Normal affect   EKG:  The EKG was personally reviewed and demonstrates:   Telemetry:  Telemetry was personally reviewed and demonstrates:    Relevant CV Studies:   Laboratory Data:  Chemistry Recent Labs  Lab 10/23/24 1954 10/23/24 1957  NA 139 137  K 4.0 4.2  CL 105 103  CO2  --  23  GLUCOSE 147* 147*  BUN 22* 21*  CREATININE 1.50* 1.45*  CALCIUM   --  10.0  GFRNONAA  --  57*  ANIONGAP  --  11    Recent  Labs  Lab 10/23/24 1957  PROT 7.9  ALBUMIN  4.5  AST 19  ALT 15  ALKPHOS 70  BILITOT 0.5   Hematology Recent Labs  Lab 10/23/24 1954 10/23/24 1957  WBC  --  10.1  RBC  --  5.18  HGB 16.3 16.4  HCT 48.0 47.7  MCV  --  92.1  MCH  --  31.7  MCHC  --  34.4  RDW  --  12.9  PLT  --  225   Cardiac EnzymesNo results for input(s): TROPONINI in the last 168 hours. No results for input(s): TROPIPOC in the last 168 hours.  BNPNo results for input(s): BNP, PROBNP in the last 168 hours.  DDimer No results for input(s): DDIMER in the last 168 hours.  Radiology/Studies:    Assessment and Plan:   Cardioembolic CVA, cryptogenic - Echocardiogram this admission showed LVEF 25% with new RWMA.  No evidence of LV thrombus. - No evidence of atrial fibrillation on the telemetry. - Neurology on board, recommended DAPT for 3 weeks followed by aspirin  81 mg once daily.  Low threshold to start Eliquis  5 mg twice daily by neurology due to low LVEF. - Ideally will need TEE to rule out left atrial appendage thrombus.  However if patient will be started on Eliquis  5 mg twice daily, he will not need TEE anymore.  Please let cardiology team know.  Chronic systolic heart failure Ischemic cardiomyopathy LVEF 20 to 25% with no device - No angina or DOE.  Active at home.  METs more than 4.  He recently returned from [place]. - He takes Entresto  24-26 mg twice daily and spironolactone  25 mg once daily at home.  He does not know who replaced his medications.  Previously followed by advanced heart failure in 2021. - Resume Entresto  24-26 mg twice daily  and spironolactone  25 mg once daily if cleared by neurology. - Outpatient follow-up with advanced heart failure. - Does not have insurance per primary team.  CAD s/p CABG in 2016 - Currently on DAPT, continue atorvastatin  80 mg nightly.  30-minute spent in using Spanish translator services.  10-minute spent in reviewing records.  15-minute spent in  documentation.  For questions or updates, please contact [facility] Please consult [email or web address] for contact info under Cardiology/STEMI.   Signed, Nikaya Nasby Priya Pamila Mendibles, MD 10/25/2024 11:22 AM      [1] No Known Allergies  "

## 2024-10-25 NOTE — Progress Notes (Signed)
 STROKE TEAM PROGRESS NOTE   INTERIM HISTORY/SUBJECTIVE No acute events overnight.    OBJECTIVE  CBC    Component Value Date/Time   WBC 10.1 10/23/2024 1957   RBC 5.18 10/23/2024 1957   HGB 16.4 10/23/2024 1957   HGB 14.4 04/28/2021 0913   HCT 47.7 10/23/2024 1957   HCT 43.6 04/28/2021 0913   PLT 225 10/23/2024 1957   PLT 188 04/28/2021 0913   MCV 92.1 10/23/2024 1957   MCV 93 04/28/2021 0913   MCH 31.7 10/23/2024 1957   MCHC 34.4 10/23/2024 1957   RDW 12.9 10/23/2024 1957   RDW 12.8 04/28/2021 0913   LYMPHSABS 4.0 10/23/2024 1957   LYMPHSABS 3.0 04/28/2021 0913   MONOABS 0.7 10/23/2024 1957   EOSABS 0.2 10/23/2024 1957   EOSABS 0.2 04/28/2021 0913   BASOSABS 0.0 10/23/2024 1957   BASOSABS 0.0 04/28/2021 0913    BMET    Component Value Date/Time   NA 137 10/23/2024 1957   NA 139 04/28/2021 0913   K 4.2 10/23/2024 1957   CL 103 10/23/2024 1957   CO2 23 10/23/2024 1957   GLUCOSE 147 (H) 10/23/2024 1957   BUN 21 (H) 10/23/2024 1957   BUN 9 04/28/2021 0913   CREATININE 1.45 (H) 10/23/2024 1957   CALCIUM  10.0 10/23/2024 1957   EGFR 104 04/28/2021 0913   GFRNONAA 57 (L) 10/23/2024 1957    IMAGING past 24 hours CT ANGIO HEAD NECK W WO CM (CODE STROKE) Result Date: 10/24/2024 EXAM: CTA Head and Neck with Intravenous Contrast. CT Head without Contrast. CLINICAL HISTORY: 53 year old male with neurological deficit, R MCA infarcts on CT and MRI TECHNIQUE: Axial CTA images of the head and neck performed with intravenous contrast. MIP reconstructed images were created and reviewed. Axial computed tomography images of the head/brain performed without intravenous contrast. Note: Per PQRS, the description of internal carotid artery percent stenosis, including 0 percent or normal exam, is based on [employer] American Symptomatic Carotid Endarterectomy Trial (NASCET) criteria. Dose reduction technique was used including one or more of the following: automated exposure control, adjustment  of mA and kV according to patient size, and/or iterative reconstruction. CONTRAST: Without and with; 75 mL iohexol  (OMNIPAQUE ) 350 MG/ML injection. COMPARISON: CT Head 10/23/2024, MRI 10/24/2024 00:09 AM FINDINGS: CT HEAD: BRAIN: Confluent cytotoxic edema in the anterior right MCA territory. Patchy additional operculum hypodensity appears unchanged from the CT 10/23/2024. Diffusion restriction in those areas and scattered elsewhere in the posterior right MCA territory which is occult by CT. No acute intraparenchymal hemorrhage. No mass lesion. No midline shift or extra-axial collection. No new cerebral edema. VENTRICLES: No hydrocephalus. ORBITS: The orbits are unremarkable. SINUSES AND MASTOIDS: The paranasal sinuses, tympanic cavities, and mastoid air cells remain well aerated. CTA NECK: AORTIC ARCH: 3 vessel aortic arch with moderate arch atherosclerosis. Mild proximal right subclavian artery atherosclerosis without stenosis. Mild to moderate proximal left subclavian atherosclerosis without stenosis. COMMON CAROTID ARTERIES: No significant stenosis. No dissection or occlusion. Intermittent atherosclerosis, most pronounced at the right carotid bifurcation. No significant left carotid stenosis. INTERNAL CAROTID ARTERIES: Right ICA: No stenosis by NASCET criteria. No dissection or occlusion. Negative right ICA siphon. Left ICA: Soft and calcified plaque at the origin with less than 50% stenosis. Mild siphon soft and calcified plaque without significant stenosis. No dissection or occlusion. VERTEBRAL ARTERIES: Fairly codominant vertebral arteries. Left vertebral V2 calcified plaque occasionally noted. No left vertebral artery stenosis to the vertebrobasilar junction. Normal left vertebral artery origin. Minimal right vertebral artery V2 segment  plaque. The right vertebral V4 segment is mildly dominant. No right vertebral stenosis. Normal right vertebral artery origin. No dissection or occlusion. CTA HEAD: ANTERIOR  CEREBRAL ARTERIES: Normal ACA origins. No significant stenosis. No occlusion. No aneurysm. MIDDLE CEREBRAL ARTERIES: Normal MCA origins. Right MCA: Near occlusion of the bifurcation or trifurcation (series 13 image 115), but with most of the right MCA M2 branches reconstituted, and less apparent in the other imaging planes. However, the dominant anterior right M2 division is likely occluded, with a paucity of anterior and middle division right MCA branches as seen on series 16 image 23. The right M1 segment proximal to that is patent without stenosis. Normal right anterior temporal artery origin. Left MCA: No significant stenosis. No occlusion. No aneurysm. POSTERIOR CEREBRAL ARTERIES: Mild right PCA P2 segment irregularity without stenosis. Normal PCA origins. No occlusion. No aneurysm. BASILAR ARTERY: No significant stenosis. No occlusion. No aneurysm. OTHER: Diminutive or absent posterior communicating arteries. Normal anterior communicating artery. Normal right PICA origin. Normal SCA origins. Normal PCA origins. SOFT TISSUES: Nonvascular neck soft tissue spaces are within normal limits. Partially visible sequelae of CABG. Mild pulmonary hyperinflation. Aspirated or retained secretions at the carina and in the right mainstem bronchus (series 11 image 183), but no visible lung opacity. BONES: Previous sternotomy. Ordinary cervical spine degeneration. No acute osseous abnormality. INTRACRANIAL VENOUS: Early intracranial venous contrast timing, not well evaluated. IMPRESSION: 1. Near occlusion of the right MCA bi/trifurcation appears related to acute thromboembolic disease (ELVO), but with Reconstituted right MCA branches except for some of the anterior and middle divisions. 2. Other atherosclerosis in the head and neck, but no other hemodynamically significant stenosis. Prior CABG. 3. Unchanged CT appearance of right MCA infarct since yesterday, no hemorrhagic transformation transformation or significant mass  effect. 4. Salient findings communicated by text page to Dr. Vanessa, who called back and we briefly discussed at 05:49 hours on 10/24/2024. Electronically signed by: Helayne Hurst MD 10/24/2024 05:54 AM EST RP Workstation: [id number]   MR Brain W and Wo Contrast Result Date: 10/24/2024 EXAM: MRI BRAIN WITH AND WITHOUT CONTRAST 10/24/2024 12:31:00 AM TECHNIQUE: Multiplanar multisequence MRI of the head/brain was performed with and without the administration of intravenous contrast. COMPARISON: CT head 10/23/2024. CLINICAL HISTORY: Neuro deficit, acute, stroke suspected FINDINGS: BRAIN AND VENTRICLES: Acute infarct in the right frontal lobe with smaller acute infarcts in the more posterior right frontal and parietal lobes. No midline shift. No acute intracranial hemorrhage. No hydrocephalus. The sella is unremarkable. Normal flow voids. No mass or abnormal enhancement. ORBITS: No acute abnormality. SINUSES: No acute abnormality. BONES AND SOFT TISSUES: Normal bone marrow signal and enhancement. No acute soft tissue abnormality. IMPRESSION: 1. Acute infarct in the right frontal lobe with smaller acute infarcts in the more posterior right frontal and parietal lobes. Electronically signed by: Gilmore Molt 10/24/2024 12:50 AM EST RP Workstation: [id number]   CT HEAD WO CONTRAST Addendum Date: 10/23/2024 ADDENDUM #1 ADDENDUM: These findings were discussed with Dr. Bari over the phone by Dr. Margarite on 10/23/24 at 9:07 pm ---------------------------------------------------- Electronically signed by: Kate Margarite MD 10/23/2024 09:08 PM EST RP Workstation: [id number]   Result Date: 10/23/2024 ORIGINAL REPORT EXAM: CT HEAD WITHOUT CONTRAST 10/23/2024 08:12:13 PM TECHNIQUE: CT of the head was performed without the administration of intravenous contrast. Automated exposure control, iterative reconstruction, and/or weight based adjustment of the mA/kV was utilized to reduce the radiation dose to as low as  reasonably achievable. COMPARISON: None available. CLINICAL HISTORY: Neuro deficit, acute, stroke suspected. FINDINGS:  BRAIN AND VENTRICLES: No acute hemorrhage. Loss of gray-white matter differentiation of the right frontal lobe with associated edema. No hydrocephalus. No extra-axial collection. No midline shift. ORBITS: No acute abnormality. SINUSES: No acute abnormality. SOFT TISSUES AND SKULL: No acute soft tissue abnormality. No skull fracture. IMPRESSION: 1. 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 2. No intracranial hemorrhage. Electronically signed by: Kate Plummer MD 10/23/2024 09:03 PM EST RP Workstation: [id number]    Vitals:   10/25/24 0000 10/25/24 0657 10/25/24 0753 10/25/24 1213  BP: (!) 106/50 121/65 124/64 (!) 113/53  Pulse:  (!) 50 (!) 55 (!) 52  Resp:  13 17 19   Temp:  98.2 F (36.8 C) 98 F (36.7 C) 98.4 F (36.9 C)  TempSrc:  Oral Oral Oral  SpO2:  92% 98%   Weight:      Height:         PHYSICAL EXAM General:  Alert, well-nourished, well-developed patient in no acute distress Psych:  Mood and affect appropriate for situation CV: Regular rate and rhythm on monitor Respiratory:  Regular, unlabored respirations on room air GI: Abdomen soft and nontender   NEURO:  Mental Status: Spanish-speaking, AA&Ox3, patient is able to give clear and coherent history Speech/Language: speech is without dysarthria or aphasia.  Naming, repetition, fluency, and comprehension intact.  Cranial Nerves:  II: PERRL. Visual fields full.  III, IV, VI: EOMI. Eyelids elevate symmetrically.  V: Sensation is intact to light touch and symmetrical to face.  VII: Slight left nasolabial flattening VIII: hearing intact to voice. IX, X: Palate elevates symmetrically. Phonation is normal.  KP:Dynloizm shrug 5/5. XII: tongue is midline without fasciculations. Motor: 5/5 strength to all muscle groups tested.  Tone: is normal and bulk is normal Sensation- Intact to light touch  bilaterally. Extinction absent to light touch to DSS.   Coordination: FTN intact bilaterally, HKS: no ataxia in BLE.No drift.  Gait- deferred      ASSESSMENT/PLAN  Isaac Ray is a 53 y.o. male with hx of CAD s/p CABG, HTN, DM2, HLD who woke up 2 days ago with L sided weakness. CT head revealed acute/subacute right frontal lobe infarct.  MRI brain showed right M2 territory infarct.  Vessel imaging showed right M1 occlusion/high-grade stenosis distal opacification.  No significant, flow-limiting atherosclerotic disease appreciated in the proximal carotids.  Etiology of stroke is likely cardioembolic given low EF on 2D echo 20-25%.  Patient was apparently supposed to be on warfarin for HFrEF but discontinued it during his trip to [place].  No further inpatient stroke workup indicated.  No need for Zio patch.  Discontinue aspirin  and Plavix  and restart warfarin.  Warfarin can be started now since it has been 3 days or more since stroke onset.  Etiology: cardioembolic Start warfarin now for low EF.  Patient reportedly cannot afford Eliquis . LDL 102, goal <70, continue atorvastatin  80 mg daily. HgbA1c 8.6 (goal <7) VTE prophylaxis -okay for chemical DVT prophy. Recommend PT, OT, SLP No further inpatient stroke workup indicated. Discussed importance of tobacco cessation. Outpatient vascular neurology follow-up    Thedford Homans, MD Vascular Neurology   Hospital day # 2    To contact Stroke Continuity provider, please refer to [email or web address]. After hours, contact General Neurology

## 2024-10-26 ENCOUNTER — Other Ambulatory Visit (HOSPITAL_COMMUNITY): Payer: Self-pay

## 2024-10-26 ENCOUNTER — Encounter (HOSPITAL_COMMUNITY): Payer: Self-pay | Admitting: Internal Medicine

## 2024-10-26 ENCOUNTER — Telehealth: Payer: Self-pay | Admitting: Pharmacy Technician

## 2024-10-26 DIAGNOSIS — I5020 Unspecified systolic (congestive) heart failure: Secondary | ICD-10-CM

## 2024-10-26 LAB — CBC
HCT: 45 % (ref 39.0–52.0)
Hemoglobin: 16 g/dL (ref 13.0–17.0)
MCH: 32.1 pg (ref 26.0–34.0)
MCHC: 35.6 g/dL (ref 30.0–36.0)
MCV: 90.2 fL (ref 80.0–100.0)
Platelets: 210 K/uL (ref 150–400)
RBC: 4.99 MIL/uL (ref 4.22–5.81)
RDW: 12.6 % (ref 11.5–15.5)
WBC: 9.7 K/uL (ref 4.0–10.5)
nRBC: 0 % (ref 0.0–0.2)

## 2024-10-26 LAB — BASIC METABOLIC PANEL WITH GFR
Anion gap: 11 (ref 5–15)
BUN: 19 mg/dL (ref 6–20)
CO2: 19 mmol/L — ABNORMAL LOW (ref 22–32)
Calcium: 9.1 mg/dL (ref 8.9–10.3)
Chloride: 107 mmol/L (ref 98–111)
Creatinine, Ser: 0.94 mg/dL (ref 0.61–1.24)
GFR, Estimated: >60 mL/min
Glucose, Bld: 128 mg/dL — ABNORMAL HIGH (ref 70–99)
Potassium: 3.8 mmol/L (ref 3.5–5.1)
Sodium: 137 mmol/L (ref 135–145)

## 2024-10-26 LAB — GLUCOSE, CAPILLARY
Glucose-Capillary: 144 mg/dL — ABNORMAL HIGH (ref 70–99)
Glucose-Capillary: 146 mg/dL — ABNORMAL HIGH (ref 70–99)
Glucose-Capillary: 207 mg/dL — ABNORMAL HIGH (ref 70–99)
Glucose-Capillary: 100 mg/dL — ABNORMAL HIGH (ref 70–99)

## 2024-10-26 LAB — TSH: TSH: 1.92 u[IU]/mL (ref 0.350–4.500)

## 2024-10-26 MED ORDER — APIXABAN 5 MG PO TABS
5.0000 mg | ORAL_TABLET | Freq: Two times a day (BID) | ORAL | Status: DC
  Administered 2024-10-26 – 2024-10-27 (×3): 5 mg via ORAL
  Filled 2024-10-26: qty 2
  Filled 2024-10-26 (×2): qty 1

## 2024-10-26 NOTE — Evaluation (Signed)
 Speech Language Pathology Evaluation Patient Details Name: Isaac Ray MRN: [id number] DOB: Mar 28, 1971 Today's Date: 10/26/2024 Time: 8789-8764 SLP Time Calculation (min) (ACUTE ONLY): 25 min  Problem List:  Patient Active Problem List   Diagnosis Date Noted   CVA (cerebral vascular accident) (HCC) 10/23/2024   Acute respiratory failure with hypoxia (HCC) 03/14/2021   Abdominal pain 03/14/2021   Long term (current) use of anticoagulants 04/30/2019   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   Acute appendicitis 04/25/2019   S/P laparoscopic appendectomy 04/25/2019   Gangrene of toe of left foot (HCC)    Critical lower limb ischemia (HCC) 03/30/2019   Ischemic cardiomyopathy 03/24/2019   Critical limb ischemia with history of revascularization of same extremity (HCC) 03/24/2019   Type 2 diabetes mellitus (HCC) 02/28/2017   PAOD (peripheral arterial occlusive disease) 02/08/2017   Essential hypertension 04/20/2015   Hyperlipidemia 04/20/2015   Non-insulin  dependent type 2 diabetes mellitus (HCC) 04/20/2015   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   NSTEMI (non-ST elevated myocardial infarction) (HCC) 02/12/2015   Past Medical History:  Past Medical History:  Diagnosis Date   CAD (coronary artery disease)    Essential hypertension    Heart murmur    when I was a teenager- no mention of it now   Hyperlipidemia    LV (left ventricular) mural thrombus without MI (HCC) 04/27/2019   Type 2 diabetes mellitus (HCC)    Type II   Past Surgical History:  Past Surgical History:  Procedure Laterality Date   AMPUTATION Left 04/08/2019   Procedure: LEFT FOOT 4TH TOE AMPUTATION;  Surgeon: Harden Jerona GAILS, MD;  Location: [facility] OR;  Service: Orthopedics;  Laterality: Left;   CORONARY ANGIOPLASTY WITH STENT PLACEMENT  2013   CORONARY ARTERY BYPASS GRAFT N/A 02/16/2015   Procedure: CORONARY ARTERY BYPASS GRAFTING (CABG) x5 using left internal mammary artery, right thigh greater saphenous vein,  and left radial artery. ;  Surgeon: Elspeth JAYSON Millers, MD;  Location: [facility] OR;  Service: Open Heart Surgery;  Laterality: N/A;   LAPAROSCOPIC APPENDECTOMY N/A 04/25/2019   Procedure: APPENDECTOMY LAPAROSCOPIC;  Surgeon: Kinsinger, Herlene Righter, MD;  Location: [facility] OR;  Service: General;  Laterality: N/A;   LEFT HEART CATHETERIZATION WITH CORONARY ANGIOGRAM N/A 02/14/2015   Procedure: LEFT HEART CATHETERIZATION WITH CORONARY ANGIOGRAM;  Surgeon: Dorn JINNY Lesches, MD;  Location: [facility] CATH LAB;  Service: Cardiovascular;  Laterality: N/A;   LOWER EXTREMITY ANGIOGRAPHY N/A 03/30/2019   Procedure: LOWER EXTREMITY ANGIOGRAPHY;  Surgeon: Lesches Dorn JINNY, MD;  Location: [facility] INVASIVE CV LAB;  Service: Cardiovascular;  Laterality: N/A;   RADIAL ARTERY HARVEST Left 02/16/2015   Procedure: RADIAL ARTERY HARVEST;  Surgeon: Elspeth JAYSON Millers, MD;  Location: [facility] OR;  Service: Open Heart Surgery;  Laterality: Left;   TEE WITHOUT CARDIOVERSION N/A 02/16/2015   Procedure: TRANSESOPHAGEAL ECHOCARDIOGRAM (TEE);  Surgeon: Elspeth JAYSON Millers, MD;  Location: [facility] OR;  Service: Open Heart Surgery;  Laterality: N/A;   HPI:  Isaac Ray is a 53 y.o. male with medical history significant of CAD, hypertension, hyperal anemia who presented to emergency department with left-sided weakness.  Patient was dropping things on the left side and wife noted facial droop.  He presented to the ER.  He denied any prior history of stroke or other risk factors.  NIH stroke scale on arrival was 2.  Neurology was consulted.  He underwent CT head which showed some concern for acute frontal lobe infarct neurology recommended MRI brain.  MRI brain indicated on  12/26 acute infarct in the right frontal lobe with smaller acute infarcts in the more posterior right frontal and parietal lobes. ST consulted for speech/language cognitive assessment.   Assessment / Plan / Recommendation Clinical Impression  Recommend ST f/u for cognitive/linguistic  changes d/t R frontal CVA c/b L inattention/decreased anticipatory awareness and executive functioning deficits.  Recommend ST f/u at next venue of care.  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  Portions of [employer] Mental Status Examination (SLUMS) and Cognistat completed with decreased sustained attention for simple calculation and L inattention with writing task requiring paper provided midline to complete simple reading/writing task with minimal verbal cues provided. Wife noted changes in pt performance/inattention also during assessment when pt would indicate he could complete a task without difficulty.  Speech intelligible and OME revealed slight L lingual deviation but no reported dysarthria/dysphagia per wife/patient.  Deductive naming task proved challenging as pt required category cues to complete and extended time needed for simple naming task d/t potential thought organization/memory recall/working memory deficits.  Continued cognitive assessment warranted.  Thank you for this consult.    SLP Assessment  SLP Recommendation/Assessment: Patient needs continued Speech Language Pathology Services SLP Visit Diagnosis: Frontal lobe and executive function deficit;Cognitive communication deficit (R41.841);Attention and concentration deficit Attention and concentration deficit following: Cerebral infarction Frontal lobe and executive function deficit following: Cerebral infarction     Assistance Recommended at Discharge  Intermittent Supervision/Assistance  Functional Status Assessment Patient has had a recent decline in their functional status and demonstrates the ability to make significant improvements in function in a reasonable and  predictable amount of time.  Frequency and Duration min 1 x/week  1 week      SLP Evaluation Cognition  Overall Cognitive Status: Impaired/Different from baseline Arousal/Alertness: Awake/alert Orientation Level: Oriented X4 Attention: Sustained Sustained Attention: Impaired Sustained Attention Impairment: Verbal basic;Functional basic Memory: Impaired Memory Impairment: Decreased recall of new information;Retrieval deficit;Decreased short term memory Decreased Short Term Memory: Verbal basic;Functional basic Awareness: Impaired Awareness Impairment: Anticipatory impairment Problem Solving: Impaired Problem Solving Impairment: Verbal basic;Functional basic Safety/Judgment: Impaired Comments: Pt feels he could return to work and denies deficits unless made aware of L side d/t inattention       Comprehension  Auditory Comprehension Overall Auditory Comprehension: Appears within functional limits for tasks assessed Commands: Within Functional Limits Interfering Components: Working civil service fast streamer;Attention EffectiveTechniques: Repetition Visual Recognition/Discrimination Discrimination: Within Function Limits Reading Comprehension Reading Status: Not tested (able to read environmental brochure/signs, but detailed reading not assessed)    Expression Expression Primary Mode of Expression: Verbal Verbal Expression Overall Verbal Expression: Appears within functional limits for tasks assessed Level of Generative/Spontaneous Verbalization: Conversation Naming: Impairment Confrontation: Within functional limits Divergent: 75-100% accurate Pragmatics: Unable to assess Interfering Components: Attention Non-Verbal Means of Communication: Not applicable Written Expression Dominant Hand: Right Written Expression: Exceptions to [facility] Interfering Components: Left neglect/inattention   Oral / Motor  Oral Motor/Sensory Function Overall Oral Motor/Sensory Function: Mild impairment Lingual  Symmetry: Abnormal symmetry left Motor Speech Overall Motor Speech: Appears within functional limits for tasks assessed Respiration: Within functional limits Phonation: Normal Resonance: Within functional limits Articulation: Within functional limitis Intelligibility: Intelligible Motor Planning: Within functional limits Motor Speech Errors: Not applicable            Pat Fermon Ureta,M.S.,CCC-SLP 10/26/2024, 12:59 PM

## 2024-10-26 NOTE — TOC CAGE-AID Note (Signed)
 Transition of Care [facility]) - CAGE-AID Screening   Patient Details  Name: Isaac Ray MRN: [id number] Date of Birth: 09-21-1971  [facility]) CM/SW Contact:    Veona Bittman E Diandra Cimini, LCSW Phone Number: 10/26/2024, 11:02 AM   Clinical Narrative: No SA noted.   CAGE-AID Screening:    Have You Ever Felt You Ought to Cut Down on Your Drinking or Drug Use?: No Have People Annoyed You By Critizing Your Drinking Or Drug Use?: No Have You Felt Bad Or Guilty About Your Drinking Or Drug Use?: No Have You Ever Had a Drink or Used Drugs First Thing In The Morning to Steady Your Nerves or to Get Rid of a Hangover?: No CAGE-AID Score: 0  Substance Abuse Education Offered: No

## 2024-10-26 NOTE — Progress Notes (Addendum)
 "  Rounding Note   Patient Name: Isaac Ray Date of Encounter: 10/26/2024  [facility] Cardiologist: Dorn Lesches, MD   Subjective  Patient is doing well this morning with no acute cardiac complaints. He is asymptomatic this morning. He has been living in [place] for the past 3 years and was not taking any medications while there. He reports some intermittent brief episode of palpitations prior to admission as well as random episodes of near syncope (that are not related to the palpitations). However, no overt syncope. He denies having any chest pain, shortness of breath, or edema prior to admission.  Scheduled Meds:  apixaban   5 mg Oral BID   atorvastatin   80 mg Oral Daily   feeding supplement  237 mL Oral BID BM   influenza vac split trivalent PF  0.5 mL Intramuscular Tomorrow-1000   insulin  aspart  0-15 Units Subcutaneous TID WC   insulin  aspart  0-5 Units Subcutaneous QHS   insulin  glargine  10 Units Subcutaneous Daily   pneumococcal 20-valent conjugate vaccine  0.5 mL Intramuscular Tomorrow-1000   sacubitril -valsartan   1 tablet Oral BID   spironolactone   25 mg Oral Daily   Continuous Infusions:  PRN Meds: acetaminophen  **OR** acetaminophen , guaiFENesin -dextromethorphan , ondansetron  **OR** ondansetron  (ZOFRAN ) IV   Vital Signs  Vitals:   10/26/24 0510 10/26/24 0700 10/26/24 0740 10/26/24 0800  BP:   (!) 114/57 109/67  Pulse: (!) 52   87  Resp:   18 18  Temp: 98.2 F (36.8 C) 98 F (36.7 C) 98 F (36.7 C) 98 F (36.7 C)  TempSrc: Oral  Oral Oral  SpO2: 98%     Weight:      Height:        Intake/Output Summary (Last 24 hours) at 10/26/2024 1122 Last data filed at 10/26/2024 0840 Gross per 24 hour  Intake 600 ml  Output --  Net 600 ml      10/23/2024    9:41 PM 03/21/2021    9:54 AM 03/17/2021    5:21 AM  Last 3 Weights  Weight (lbs) 164 lb 14.5 oz 165 lb 159 lb 6.3 oz  Weight (kg) 74.8 kg 74.844 kg 72.3 kg      Telemetry Sinus  bradycardia/ junctional bradycardia with rates in the 40s to 60s. Occasional PVCs. - Personally Reviewed  ECG  No new ECG tracing today. - Personally Reviewed  Physical Exam  GEN: No acute distress.   Neck: No JVD. Cardiac: RRR. Possible soft murmur. No rubs or gallops.  Respiratory: No increased work of breathing. Clear to auscultation bilaterally. No wheezes, rhonchi, or rales. MS: No lower extremity edema. Neuro:  No gross neurologic deficits.  Psych: Normal affect.   Labs High Sensitivity Troponin:  No results for input(s): TROPONINIHS in the last 720 hours. No results for input(s): TRNPT in the last 720 hours.     Chemistry Recent Labs  Lab 10/23/24 1954 10/23/24 1957 10/26/24 0455  NA 139 137 137  K 4.0 4.2 3.8  CL 105 103 107  CO2  --  23 19*  GLUCOSE 147* 147* 128*  BUN 22* 21* 19  CREATININE 1.50* 1.45* 0.94  CALCIUM   --  10.0 9.1  PROT  --  7.9  --   ALBUMIN   --  4.5  --   AST  --  19  --   ALT  --  15  --   ALKPHOS  --  70  --   BILITOT  --  0.5  --  GFRNONAA  --  57* >60  ANIONGAP  --  11 11    Lipids  Recent Labs  Lab 10/24/24 0458  CHOL 152  TRIG 118  HDL 27*  LDLCALC 102*  CHOLHDL 5.7    Hematology Recent Labs  Lab 10/23/24 1954 10/23/24 1957 10/26/24 0455  WBC  --  10.1 9.7  RBC  --  5.18 4.99  HGB 16.3 16.4 16.0  HCT 48.0 47.7 45.0  MCV  --  92.1 90.2  MCH  --  31.7 32.1  MCHC  --  34.4 35.6  RDW  --  12.9 12.6  PLT  --  225 210   Thyroid No results for input(s): TSH, FREET4 in the last 168 hours.  BNPNo results for input(s): BNP, PROBNP in the last 168 hours.  DDimer No results for input(s): DDIMER in the last 168 hours.   Radiology  No results found.   Cardiac Studies  Echocardiogram 10/24/2024: Impressions: 1. Left ventricular ejection fraction, by estimation, is 20 to 25%. The  left ventricle has severely decreased function. The left ventricle  demonstrates regional wall motion abnormalities (see  scoring  diagram/findings for description). The left  ventricular internal cavity size was severely dilated. Left ventricular  diastolic parameters are consistent with Grade I diastolic dysfunction  (impaired relaxation).   2. Right ventricular systolic function is low normal. The right  ventricular size is not well visualized.   3. The mitral valve is normal in structure. No evidence of mitral valve  regurgitation. No evidence of mitral stenosis.   4. The aortic valve is tricuspid. Aortic valve regurgitation is not  visualized.    Patient Profile   53 y.o. 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 Cardiology was consulted on 10/25/2024 for evaluation of CHF.  Assessment & Plan   Acute CVA Patient presented with left sided weakness and facial droop. Brain MRI showed an acute infarct in the right frontal lobe with small acute infarcts in the more posterior right frontal and parietal lobes. Head/ neck CTA showed near occlusion of the right MCA bi/trifurcation that appear related to acute thromboembolic disease. Echo showed severely reduced LVEF but no definitive LV thrombus (although he does have a history of this and stopped taking Warfarin about 2 years ago). - Left sided weakness and facial droop almost completely resolved. - He was initially started on DAPT with Aspirin  and Plavix  but has now been transitioned to Eliquis  5mg  twice daily as  stroke is suspected to be cardioembolic in nature.  - Continue Lipitor  80mg  daily. - Otherwise, management per Neurology vs primary team.  Chronic HFrEF Patient has a long history of ischemic cardiomyopathy with chronic HFrEF. EF did not improve after revascularization with CABG. Last Echo in 02/2021 showed LVEF of 25-30% Echo this admit  LV again is severely dilated as before   LVEF is severely depressed    I am not convinced of signficant change Pt appears euvolemic on exam.  - Continue Entresto  24-26mg  twice  daily.  Just started yesterday   Started spironolactone  today  BP low   I would recomm holding  - Will check on cost of SGLT2 inhibitors.   HOld for now with BP soft  - No beta-blocker given baseline bradycardia. - Patient would benefit from ICD for primary prevention of sudden cardiac death but he does not have insurance so not sure if this will be an option. Will check with EP team. Re possible BiV ICD  CAD s/p CABG S/p CABG x5 in 2016. Echo this admission shows severely reduced LV function with multiple wall motion abnormalities. - No chest pain or anginal symptoms. - No antiplatelet therapy given need for Eliquis  as above.   Palpitations Bradycardia Near Syncope He does reports intermittent brief episodes of palpitations and random episodes of near syncope (unrelated to episodes of palpitations at home) at home.  - Telemetry shows sinus bradycardia/ junctional bradycardia with rates in the 40s at times. No high grade AV block or significant pauses noted. - Not on any AV nodal agents.  - Will check orthostatic vital signs for thoroughness given near syncope and language barrier (although it does not sound like this is the cause). - Consider outpt monitor   Hypertension BP soft today    Hold spironolactone   -   Hyperlipidemia Lipid panel in 09/2024: Total Cholesterol 152, Triglycerides 118, HDL 27, LDL 102. LDL goal <55.  - He has been restarted on Lipitor  80mg  daily.  -  Will need repeat lipid panel and LFTs in 6-8 weeks.   Type 2 Diabetes Mellitus Hemoglobin A1c 8.6% this admission. - Management per PCP.   For questions or updates, please contact [facility] Please consult [email or web address] for contact info under   Signed, Callie E Goodrich, PA-C  10/26/2024, 11:22 AM    ADDENDUM: Spoke with office staff about outpatient monitor.  We can order an outpatient monitor but it will be self pay.  There is a self pay discount, and we do have a monitor company they could apply for financial assistance with.  The cost of a [employer] self pay 30 day cardiac event monitor is $225 so this may be an option.  Reviewed with EP - ICD would be self pay as well but they usually try to get the patient's enrolled with [facility]'s financial assistance program.  Also discussed cost of SGLT2 inhibitor with Pharmacy team. Jardiance  cash price is [phone] and Doreen cash price is [phone]. Will go ahead and start working on patient assistance's application for Jardiance .  Cam be reassessed /followed up on as outpt.  Pt not symptomatic    Of note, patient previously had Medicaid and daughter is going to check to see if he can get re-enrolled in this.   Callie E Goodrich, PA-C 10/26/2024 12:31 PM  Pt seen and examined   I agree with findings as noted by JAYSON Door above   I have amended not some to clarify Pt comfortable in bed   Language is difficult  Neck:  JVP is normal Lungs are CTA  Cardiac RRR  No S3    Abd supple  Ext  No edema Neuro deferred  As noted above   Known CAD  No symptoms of angina  Chronic HFrEF   OVerall, I do not think signficantly changed from 2022 Initiated Entresto    Follow asa outpt On anticoagulation for presumed embolic event   Follow over time   Vina Gull MD     "

## 2024-10-26 NOTE — Discharge Instructions (Signed)
 Programas de Asistencia del [place] de [place]  -El Centro Hispano: [place] [phone] Northrop [phone]  -Hispanic League [place]): [phone] [email or web address]   Programas de Clinical cytogeneticist -[facility]eless[facility] Programa de [place] Coordinada. Si usted est experimentando la falta de vivienda en el Cond[place] de [place], [place], [place] primer punto de Barrister's clerk. Puede ponerse en contacto con Coordinated Entry llamando al [phone] o enviando un correo electrnico a [email or web address].  Puntos de acceso comunitarios: Biblioteca de [employer] ([address] [place], [place]) todos los [place] de 9am-10am. [employer] ([address] [place]. [address], [place]) todos los Toys ''[employer] Us de 8 a 9 de la maana.   -El Ministerio Urbano de Regino Ramirez [phone]) ofrece varios servicios a las familias locales, segn lo Dillard's. El Programa de Asistencia de Associate Professor (EAP), que administran, proporciona artculos para Advice worker, C.[employer]. [employer] Worldwide, ropa y [place] financiera a las personas necesitadas en el rea de [place] del [place]. El [place] EAP tiene Nationwide Mutual Insurance, y los consejeros entrevistarn a los clientes para la asistencia financiera slo por referencia por escrito. Las remisiones deben ser hechas por el Departamento de [place] o por otras agencias de servicios humanos aprobadas por el EAP u organizaciones benficas de la zona.  -Open Door [facility]ries of [employer], con la que se puede contactar en el [phone], ofrece programas de asistencia de emergencia para los necesitados de [place], como [place], [place] para Camera operator, un comedor social, refugio y Secondary school teacher. Tienen su sede en [place], [place], [place] prestan [place] serie de servicios a quienes renen los requisitos para recibir asistencia -El Departamento de [place] del [place] de [place] puede ofrecer  asistencia financiera temporal y subvenciones en efectivo para Tour manager y los servicios pblicos, Se puede proporcionar ayuda a los residentes locales del condado que puedan estar experimentando Neomia Dear crisis personal cuando otros recursos, incluyendo programas gubernamentales, no estn disponibles. Llame al [phone]  -[facility] es una agencia sin fines de lucro financiada por [place], La organizacin puede ofrecer ayuda de emergencia para Tour manager, facturas de electricidad, servicios pblicos, alimentos, productos para el hogar y [employer]. Ofrecen un amplio alojamiento de emergencia y de transicin para familias, nios y Emergency planning/management officer, y tambin dirigen un Club de Nios y [place]. Tambin ofrecen tiendas de [address], bancos de ropa y [place]. [address] [place], [place] [address], [phone]  -[place] Low Income Energy Assistance Program - Se ofrece para las familias del condado de [place]. El [place] de Asistencia de [place] de Bajos Ingresos creado por el gobierno federal proporciona un pago nico de subvencin en efectivo para ayudar a las familias de bajos ingresos elegibles a pagar sus facturas de electricidad y calefaccin. [address], [place], [place] [address], [phone]  -Asistencia de emergencia de [place]: un programa que ofrece fondos de emergencia para servicios pblicos y Runner, broadcasting/film/video a los residentes de la zona de [employer]. El programa tambin puede proporcionar asesoramiento y referencias a organizaciones benficas y programas gubernamentales. Tambin proporciona alimentos y un programa de comidas gratuitas que sirve el almuerzo de lunes a sbado y Chief Operating Officer a la semana a las Psychiatrist comunidad. [address], [place], [place] [address], [phone]  [phone] Housing Authority - Ofrece comunidades de apartamentos y viviendas asequibles en [place] y  el condado de [place]. Los bajos  ingresos y [employer] K[employer] pueden acceder a la vivienda pblica, asistencia para Camera operator a los solicitantes calificados, y Psychologist, counselling seccin 8 programa de subsidio de Runner, broadcasting/film/video. Otros programas incluyen Autosuficiencia Familiar y Centros de [place]. [address] [place], [place] [address], marque [phone].  [facility] proporciona viviendas de transicin a veteranos y discapacitados. Los clientes tambin tienen acceso a otros servicios, como ayuda para solicitar una discapacidad, clases de habilidades para la vida, gestin de [place] y [place] para Clinical research associate una vivienda [place]. [address], [place], [place] [address], llame al [phone]  -[facility] Transitional Housing a travs del Ministerio Urbano de [place] es para personas que acaban de ser desalojadas o que anteriormente no Leisure centre manager. La organizacin sin fines de lucro tambin ayudar a Barista la autosuficiencia, Clinical research associate una casa o apartamento para vivir, y [place] proporciona informacin sobre la ayuda de alquiler cuando sea necesario. Telfono [phone]  -El Programa de Asistencia para la Climatizacin de [place] [facility]ad [facility] Development ayuda a los residentes de bajos ingresos, ancianos o discapacitados en siete condados de [place] Triad ([place], [place], [place], [place], [place], [place], [place] y [place]) a Actuary y reducir sus facturas de servicios pblicos mediante la mejora de la eficiencia energtica. Telfono [phone].  [facility] se Occupational psychologist en el [place] [facility] en el [address], [address], [place], [place] [address]. El aparcamiento est en la parte trasera del edificio. Tel: [phone] Correo electrnico general: [email or web address]  GHC ofrece asesoramiento gratuito en materia de vivienda para Clinical research associate una vivienda de  alquiler asequible o una vivienda con servicios de apoyo para familias e individuos en crisis y personas sin hogar crnicas. Proporcionamos recursos potenciales para otras necesidades de vivienda, como los servicios pblicos. Nuestros asesores capacitados tambin trabajan con los clientes en la presupuestacin y la educacin financiera en un esfuerzo por capacitarlos para tomar el control de sus situaciones financieras. [employer] Housing Coalition colabora con los proveedores de servicios para personas sin hogar y Heritage manager partes interesadas como parte del COC (Continuum of Care) del condado de [place]. El (COC) es un organismo de planificacin regional / local que coordina la vivienda y los servicios de financiacin para las familias sin hogar y Magazine features editor. El papel de [facility] en el COC es a travs de asesoramiento de vivienda para trabajar con las personas que servimos en las estrategias de desviacin para aquellos que estn en riesgo inminente de convertirse en personas sin hogar. Tambin trabajamos con Pensions consultant en Evaluacin/Entrada Coordinada que intenta encontrar soluciones temporales y/o Investment banker, operational a las personas con los [employer] First, Rapid Re-housing o viviendas de transicin. Nuestros asesores de prevencin de la falta de vivienda se renen con los clientes [employer] (de lunes a viernes, excepto festivos programados) de 8:30 a 16:30.  Asistencia legal para desalojos, ejecuciones hipotecarias y ms -Si necesita asesoramiento jurdico gratuito sobre cuestiones civiles, como ejecuciones hipotecarias, desahucios, desconexin de facturas de servicios pblicos, programas gubernamentales, cuestiones domsticas y ms, Armed forces operational officer Aid of [address] [facility]) es un bufete de abogados sin nimo de lucro que ofrece servicios jurdicos gratuitos y Teaching laboratory technician a Dealer de bajos ingresos, Financial planner, discapacitados y [place], El objetivo es garantizar que todos tengan acceso a la justicia y a  Oceanographer. Llame al [phone].  -El Centro de [place] y Estudios Comunitarios de la UNCG puede facilitar informacin sobre cmo obtener asistencia jurdica en caso de desahucio. Telfono [phone]. Servicios de formacin  El Intel, Avnet. ofrece entrenamiento de [place] y empleo. Los recursos se Wellsite geologist a los estudiantes a Barista las habilidades y experiencias necesarias para competir en el desafiante y [place] Fordyce laboral actual. Esta agencia religiosa de accin comunitaria sin nimo de lucro ofrece formacin en prcticas, as como clases presenciales. Las clases se adaptan a las necesidades de los habitantes de la regin del condado de [place]. [place], [place] [address], [phone]  Clnicas Comunitarias en el [place] de [place] -Departamento de Salud [place] de [place]: [address] [place], [place], [address]. [phone].  -Departamento de Salud [place] de [place]: [address], [place], [address]. [phone].  -[facility] Network ofrece atencin mdica a travs de un grupo de mdicos, [place] y Heritage manager agencias relacionadas con la atencin mdica que ofrecen servicios para adultos de bajos ingresos y sin seguro en el condado de [place]. Tambin ofrece atencin dental para adultos y [place] para solicitar la [place]. Llame al [phone].   -[place] Centro Comunitario de Salud y [place]. Este centro ofrece atencin mdica de bajo costo a quienes no tienen seguro mdico. Entre los servicios que ofrece se incluye una Benjamin Perez. Telfono [phone]. [address] [place], [address], [place].  -El Programa de Asistencia de Medicamentos sirve de Transport planner entre las compaas farmacuticas y los pacientes para proporcionar medicamentos recetados a bajo coste o gratuitos. Este servicio est disponible para los residentes que cumplen con ciertas restricciones de ingresos y no tienen [place] de  Dispensing optician. POR FAVOR LLAME AL [phone] ([place]) O [phone] ([place])  -Un [place] Ms All: Materials engineer, The MetLife Support & Nutrition Program, PepsiCo. Llame al [phone]/ [phone].  Despensa de Comida y Asistencia -Ministerio Urbano-Banco de Alimentos: [address], [place] [address]. Telfono [phone]  -Blessed Table Food Pantry: [address], [place], [place] [address]. [phone]. -[place] Food Finder: [email or web address]  HUYENDO DE LA VIOLENCIA  -Family Services of the [place]- 24/7 [place] de crisis [phone])  -Centros de Justicia Familiar del [place] de [place]: [phone]  Transporte  -Servicios de [place] y [place] del [place]-Contacto para una solicitud. Sin costo para Financial planner de [address]. P: [phone]. Direccin: [address] [place], [place] [address]  -Senior Wheels Transportation-Age 67 and over. Lmite de 1 viaje por semana para participantes ambulatorios. Lmite de 1 viaje al mes para participantes no ambulatorios que necesiten transporte en silla de ruedas. Contacto: [phone] ([place]), [phone] [facility]).  -Access [place]: Disponible para personas con discapacidades que no pueden Lex[employer] International de autobs de ruta fija. De pago. Contacto: [phone] (Para solicitudes)/([phone] (Atencin al [phone] [place] de reserva I-ride)/(336) [phone] [place] de reserva Access gso)   Bsqueda de viviendas asequibles  http://www.[email or web address]  Centro de da  -Levi Strauss de da del Centro de Recursos Interactivos [facility]): L-V [id number] E. [address] [place], [place] [address] [phone] Los servicios incluyen: lavandera, [place], grupos de apoyo, [address] de [place], telfono y Teacher, adult education a computadoras, Hydrographic surveyor, AA/NA mtgs, enfermera de salud mental/abuso de sustancias, clase de habilidades de  [place], informacin de discapacidad, asistencia [place], clases espirituales, etc.   REFUGIOS PARA PERSONAS SIN [employer] - [employer] International Shelter en [employer]- Llame al [phone] ext. [phone] o ext. [phone]. Ubicado en [address], [place], [place] [address]  - [facility] Mens Shelter- Llame al [phone]. Ubicado en [address], [place] [address].  - [facility]- Refugio para mujeres. Llame al [phone]. Orlando Penner ubicada en [address], [place] [address].  - [facility] Family Housing a travs de AT[employer]T- Llame al [phone].  - Refugio Familiar [facility]- Llame al [phone]. [facility] en [address] [place], [place], [place] [address].  - Room at the [facility]. Llame al [phone]. Toys ''[employer]'' Us en [address] [place], [address].  - [place] Shelter of [employer] hombres en el condado de [place]. Llame al [phone].  - Home of [facility] para el condado de [place]-Llame al [phone]. Oficina ubicada en [address] [place], [address].  - Refugio [employer] - Debe estar dispuesto a Contractor con las tareas. Llame al [phone] ext. [phone].  Hombres: [address] [place], [place] [address]. Mujeres: [facility]  [address], [place], [place] [address]  Servicios de crisis - Alternativas Teraputicas Manejo Mvil de Crisis- [phone]  - Centro de Salud Conductual del [place] de [place]-[address], [place], [place] [address]. Telfono: [phone]   *[place] 2-1-1 es otra forma til de Medical laboratory scientific officer recursos en la comunidad. Visite [email or web address] para encontrar informacin de servicios en lnea. Si necesita Continental Airlines, los especialistas en derivaciones del 2-1-1 estn disponibles las [address] del da, todos [place], [place] 2-1-1 o [phone] desde cualquier telfono. La llamada es gratuita, confidencial y est disponible en cualquier idioma.

## 2024-10-26 NOTE — Progress Notes (Signed)
 " PROGRESS NOTE    Isaac Ray  FMW:[id number] DOB: Mar 22, 1971 DOA: 10/23/2024 PCP: Delbert Clam, MD   Chief Complaint  Patient presents with   Weakness   Facial Droop    Brief Narrative:   Isaac Ray is a 53 y.o. male with hx of CAD s/p CABG, HTN, DM2, HLD who woke up on 10/21/2024 with L sided weakness. Dropping things from L hand, walking is off and wife has noted L facial droop. He was coming back from [place] and did not think too much about it. Symptoms persistent so he came to the ED.   He denies any prior hx of stroke, mother had strokes. Does not use any recreational substances.  Denies any prior history of atrial fibrillation.  Diagnosed with a stroke.  Admitted under hospital service, neurology following.   Assessment & Plan:   Principal Problem:   CVA (cerebral vascular accident) (HCC)   Acute CVA - Source most likely cryptogenic in the setting of low EF 20% with significant regional wall motion abnormalities with history of LV thrombus noncompliant with medication, stopped taking his warfarin 2 years ago with poor follow-up when he traveled to [place]. - Recommendation for dual antiplatelet therapy, but now with today, recommendation for full anticoagulation. - On aspirin  and Plavix , need to transition to full anticoagulation as discussed with cardiology and neurology, so transition to Eliquis  this morning, currently off aspirin  and Plavix  . - At this point no need for TEE as recommendation for full anticoagulation regardless to TEE  finding as discussed with both neurology and cardiology. - PT/OT/SLP consulted. - LDL is 102, continue with atorvastatin  80 mg daily. - A1c is 8.6  Diabetes mellitus, type II, poorly controlled with A1c of 8.6 -On glipizide  and metformin  at home. - Currently on insulin  sliding scale and Lantus    History of CAD status post CABG Chronic systolic heart failure Ischemic cardiomyopathy with EF 20 to 25% -  Cardiology input greatly appreciated, currently on Eliquis , please see above discussion . - Bradycardic, so no beta-blockers  - Continue with Entresto  and Aldactone .  Discussed with renal current goal is normotension and no need for permissive hypertension. - Jardiance  versus Farxiga per cardiology, depends on affordability.  Bradycardia - Will check TSH - Will need heart monitor as an outpatient  Hyperlipidemia - LDL 102, continue with statin Will need repeat LFTs and lipid panel in 6 to 8 weeks.    DVT prophylaxis: (SCD) Code Status: (Full) Family Communication: (wife at bedside) Disposition:   Status is: Inpatient    Consultants:  Neurology cardiology   Subjective:  Patient reports weakness much improved  Objective: Vitals:   10/26/24 0700 10/26/24 0740 10/26/24 0800 10/26/24 1134  BP:  (!) 114/57 109/67 (!) 97/54  Pulse:   87 (!) 47  Resp:  18 18 10   Temp: 98 F (36.7 C) 98 F (36.7 C) 98 F (36.7 C) 97.8 F (36.6 C)  TempSrc:  Oral Oral Oral  SpO2:      Weight:      Height:        Intake/Output Summary (Last 24 hours) at 10/26/2024 1245 Last data filed at 10/26/2024 0840 Gross per 24 hour  Intake 600 ml  Output --  Net 600 ml   Filed Weights   10/23/24 2141  Weight: 74.8 kg    Examination:  Awake Alert, Oriented X 3, Good air entry bilaterally Bradycardic +ve B.Sounds No Cyanosis, Clubbing or edema      Data Reviewed:  I have personally reviewed following labs and imaging studies  CBC: Recent Labs  Lab 10/23/24 1954 10/23/24 1957 10/26/24 0455  WBC  --  10.1 9.7  NEUTROABS  --  5.2  --   HGB 16.3 16.4 16.0  HCT 48.0 47.7 45.0  MCV  --  92.1 90.2  PLT  --  225 210    Basic Metabolic Panel: Recent Labs  Lab 10/23/24 1954 10/23/24 1957 10/26/24 0455  NA 139 137 137  K 4.0 4.2 3.8  CL 105 103 107  CO2  --  23 19*  GLUCOSE 147* 147* 128*  BUN 22* 21* 19  CREATININE 1.50* 1.45* 0.94  CALCIUM   --  10.0 9.1     GFR: Estimated Creatinine Clearance: 83 mL/min (by C-G formula based on SCr of 0.94 mg/dL).  Liver Function Tests: Recent Labs  Lab 10/23/24 1957  AST 19  ALT 15  ALKPHOS 70  BILITOT 0.5  PROT 7.9  ALBUMIN  4.5    CBG: Recent Labs  Lab 10/25/24 1210 10/25/24 1606 10/25/24 2143 10/26/24 0743 10/26/24 1138  GLUCAP 218* 194* 139* 144* 146*     No results found for this or any previous visit (from the past 240 hours).       Radiology Studies: ECHOCARDIOGRAM COMPLETE Result Date: 10/24/2024    ECHOCARDIOGRAM REPORT   Patient Name:   Isaac Ray Date of Exam: 10/24/2024 Medical Rec #:  [id number]               Height:       67.0 in Accession #:    [id number]              Weight:       164.9 lb Date of Birth:  1971/05/07              BSA:          1.863 m Patient Age:    53 years                BP:           121/75 mmHg Patient Gender: M                       HR:           45 bpm. Exam Location:  Inpatient Procedure: 2D Echo, Color Doppler, Cardiac Doppler and Intracardiac            Opacification Agent (Both Spectral and Color Flow Doppler were            utilized during procedure). Indications:    Stroke I63.9  History:        Patient has prior history of Echocardiogram examinations, most                 recent 03/14/2021. CAD, Signs/Symptoms:Hypertensive Heart                 Disease; Risk Factors:Diabetes.  Sonographer:    Sydnee Wilson RDCS Referring Phys: [id number] ROBERT DORRELL IMPRESSIONS  1. Left ventricular ejection fraction, by estimation, is 20 to 25%. The left ventricle has severely decreased function. The left ventricle demonstrates regional wall motion abnormalities (see scoring diagram/findings for description). The left ventricular internal cavity size was severely dilated. Left ventricular diastolic parameters are consistent with Grade I diastolic dysfunction (impaired relaxation).  2. Right ventricular systolic function is low normal. The right  ventricular size is not well visualized.  3. The  mitral valve is normal in structure. No evidence of mitral valve regurgitation. No evidence of mitral stenosis.  4. The aortic valve is tricuspid. Aortic valve regurgitation is not visualized. Comparison(s): Prior images reviewed side by side. LVEF has further decreased. Conclusion(s)/Recommendation(s): No LV thrombus is appreciated on images by substrate for LV thrombus exists. FINDINGS  Left Ventricle: Left ventricular ejection fraction, by estimation, is 20 to 25%. The left ventricle has severely decreased function. The left ventricle demonstrates regional wall motion abnormalities. The left ventricular internal cavity size was severely dilated. There is no left ventricular hypertrophy. Left ventricular diastolic parameters are consistent with Grade I diastolic dysfunction (impaired relaxation).  LV Wall Scoring: The entire anterior wall, antero-lateral wall, entire anterior septum, apical lateral segment, and apical inferior segment are akinetic. The inferior wall, posterior wall, mid inferoseptal segment, and basal inferoseptal segment are hypokinetic. Right Ventricle: The right ventricular size is not well visualized. No increase in right ventricular wall thickness. Right ventricular systolic function is low normal. Left Atrium: Left atrial size was normal in size. Right Atrium: Right atrial size was normal in size. Pericardium: There is no evidence of pericardial effusion. Mitral Valve: The mitral valve is normal in structure. No evidence of mitral valve regurgitation. No evidence of mitral valve stenosis. Tricuspid Valve: The tricuspid valve is normal in structure. Tricuspid valve regurgitation is not demonstrated. No evidence of tricuspid stenosis. Aortic Valve: The aortic valve is tricuspid. Aortic valve regurgitation is not visualized. Aortic valve mean gradient measures 8.0 mmHg. Aortic valve peak gradient measures 15.7 mmHg. Aortic valve area, by VTI  measures 2.43 cm. Pulmonic Valve: The pulmonic valve was normal in structure. Pulmonic valve regurgitation is not visualized. No evidence of pulmonic stenosis. Aorta: The aortic root and ascending aorta are structurally normal, with no evidence of dilitation. IAS/Shunts: No atrial level shunt detected by color flow Doppler.  LEFT VENTRICLE PLAX 2D LVIDd:         6.80 cm      Diastology LVIDs:         6.30 cm      LV e' medial:    5.77 cm/s LV PW:         1.10 cm      LV E/e' medial:  12.5 LV IVS:        1.00 cm      LV e' lateral:   7.29 cm/s LVOT diam:     2.00 cm      LV E/e' lateral: 9.9 LV SV:         90 LV SV Index:   48 LVOT Area:     3.14 cm  LV Volumes (MOD) LV vol d, MOD A2C: 288.0 ml LV vol d, MOD A4C: 273.0 ml LV vol s, MOD A2C: 242.0 ml LV vol s, MOD A4C: 211.0 ml LV SV MOD A2C:     46.0 ml LV SV MOD A4C:     273.0 ml LV SV MOD BP:      57.6 ml RIGHT VENTRICLE RV S prime:     10.20 cm/s TAPSE (M-mode): 2.1 cm LEFT ATRIUM             Index        RIGHT ATRIUM           Index LA diam:        4.00 cm 2.15 cm/m   Isaac Area:     13.80 cm LA Vol (A2C):   25.2 ml 13.53 ml/m  Isaac  Volume:   25.60 ml  13.74 ml/m LA Vol (A4C):   37.0 ml 19.86 ml/m LA Biplane Vol: 30.6 ml 16.42 ml/m  AORTIC VALVE AV Area (Vmax):    2.59 cm AV Area (Vmean):   2.22 cm AV Area (VTI):     2.43 cm AV Vmax:           198.00 cm/s AV Vmean:          126.000 cm/s AV VTI:            0.371 m AV Peak Grad:      15.7 mmHg AV Mean Grad:      8.0 mmHg LVOT Vmax:         163.00 cm/s LVOT Vmean:        89.000 cm/s LVOT VTI:          0.287 m LVOT/AV VTI ratio: 0.77  AORTA Ao Root diam: 2.80 cm Ao Asc diam:  2.50 cm MITRAL VALVE MV Area (PHT): 1.71 cm     SHUNTS MV Decel Time: 443 msec     Systemic VTI:  0.29 m MV E velocity: 72.30 cm/s   Systemic Diam: 2.00 cm MV A velocity: 105.00 cm/s MV E/A ratio:  0.69 Stanly Leavens MD Electronically signed by Stanly Leavens MD Signature Date/Time: 10/24/2024/12:39:30 PM    Final    CT  ANGIO HEAD NECK W WO CM (CODE STROKE) Result Date: 10/24/2024 EXAM: CTA Head and Neck with Intravenous Contrast. CT Head without Contrast. CLINICAL HISTORY: 53 year old male with neurological deficit, R MCA infarcts on CT and MRI TECHNIQUE: Axial CTA images of the head and neck performed with intravenous contrast. MIP reconstructed images were created and reviewed. Axial computed tomography images of the head/brain performed without intravenous contrast. Note: Per PQRS, the description of internal carotid artery percent stenosis, including 0 percent or normal exam, is based on [employer] American Symptomatic Carotid Endarterectomy Trial (NASCET) criteria. Dose reduction technique was used including one or more of the following: automated exposure control, adjustment of mA and kV according to patient size, and/or iterative reconstruction. CONTRAST: Without and with; 75 mL iohexol  (OMNIPAQUE ) 350 MG/ML injection. COMPARISON: CT Head 10/23/2024, MRI 10/24/2024 00:09 AM FINDINGS: CT HEAD: BRAIN: Confluent cytotoxic edema in the anterior right MCA territory. Patchy additional operculum hypodensity appears unchanged from the CT 10/23/2024. Diffusion restriction in those areas and scattered elsewhere in the posterior right MCA territory which is occult by CT. No acute intraparenchymal hemorrhage. No mass lesion. No midline shift or extra-axial collection. No new cerebral edema. VENTRICLES: No hydrocephalus. ORBITS: The orbits are unremarkable. SINUSES AND MASTOIDS: The paranasal sinuses, tympanic cavities, and mastoid air cells remain well aerated. CTA NECK: AORTIC ARCH: 3 vessel aortic arch with moderate arch atherosclerosis. Mild proximal right subclavian artery atherosclerosis without stenosis. Mild to moderate proximal left subclavian atherosclerosis without stenosis. COMMON CAROTID ARTERIES: No significant stenosis. No dissection or occlusion. Intermittent atherosclerosis, most pronounced at the right carotid  bifurcation. No significant left carotid stenosis. INTERNAL CAROTID ARTERIES: Right ICA: No stenosis by NASCET criteria. No dissection or occlusion. Negative right ICA siphon. Left ICA: Soft and calcified plaque at the origin with less than 50% stenosis. Mild siphon soft and calcified plaque without significant stenosis. No dissection or occlusion. VERTEBRAL ARTERIES: Fairly codominant vertebral arteries. Left vertebral V2 calcified plaque occasionally noted. No left vertebral artery stenosis to the vertebrobasilar junction. Normal left vertebral artery origin. Minimal right vertebral artery V2 segment plaque. The right vertebral V4 segment is mildly dominant.  No right vertebral stenosis. Normal right vertebral artery origin. No dissection or occlusion. CTA HEAD: ANTERIOR CEREBRAL ARTERIES: Normal ACA origins. No significant stenosis. No occlusion. No aneurysm. MIDDLE CEREBRAL ARTERIES: Normal MCA origins. Right MCA: Near occlusion of the bifurcation or trifurcation (series 13 image 115), but with most of the right MCA M2 branches reconstituted, and less apparent in the other imaging planes. However, the dominant anterior right M2 division is likely occluded, with a paucity of anterior and middle division right MCA branches as seen on series 16 image 23. The right M1 segment proximal to that is patent without stenosis. Normal right anterior temporal artery origin. Left MCA: No significant stenosis. No occlusion. No aneurysm. POSTERIOR CEREBRAL ARTERIES: Mild right PCA P2 segment irregularity without stenosis. Normal PCA origins. No occlusion. No aneurysm. BASILAR ARTERY: No significant stenosis. No occlusion. No aneurysm. OTHER: Diminutive or absent posterior communicating arteries. Normal anterior communicating artery. Normal right PICA origin. Normal SCA origins. Normal PCA origins. SOFT TISSUES: Nonvascular neck soft tissue spaces are within normal limits. Partially visible sequelae of CABG. Mild pulmonary  hyperinflation. Aspirated or retained secretions at the carina and in the right mainstem bronchus (series 11 image 183), but no visible lung opacity. BONES: Previous sternotomy. Ordinary cervical spine degeneration. No acute osseous abnormality. INTRACRANIAL VENOUS: Early intracranial venous contrast timing, not well evaluated. IMPRESSION: 1. Near occlusion of the right MCA bi/trifurcation appears related to acute thromboembolic disease (ELVO), but with Reconstituted right MCA branches except for some of the anterior and middle divisions. 2. Other atherosclerosis in the head and neck, but no other hemodynamically significant stenosis. Prior CABG. 3. Unchanged CT appearance of right MCA infarct since yesterday, no hemorrhagic transformation transformation or significant mass effect. 4. Salient findings communicated by text page to Dr. Vanessa, who called back and we briefly discussed at 05:49 hours on 10/24/2024. Electronically signed by: Helayne Hurst MD 10/24/2024 05:54 AM EST RP Workstation: [id number]   MR Brain W and Wo Contrast Result Date: 10/24/2024 EXAM: MRI BRAIN WITH AND WITHOUT CONTRAST 10/24/2024 12:31:00 AM TECHNIQUE: Multiplanar multisequence MRI of the head/brain was performed with and without the administration of intravenous contrast. COMPARISON: CT head 10/23/2024. CLINICAL HISTORY: Neuro deficit, acute, stroke suspected FINDINGS: BRAIN AND VENTRICLES: Acute infarct in the right frontal lobe with smaller acute infarcts in the more posterior right frontal and parietal lobes. No midline shift. No acute intracranial hemorrhage. No hydrocephalus. The sella is unremarkable. Normal flow voids. No mass or abnormal enhancement. ORBITS: No acute abnormality. SINUSES: No acute abnormality. BONES AND SOFT TISSUES: Normal bone marrow signal and enhancement. No acute soft tissue abnormality. IMPRESSION: 1. Acute infarct in the right frontal lobe with smaller acute infarcts in the more posterior right  frontal and parietal lobes. Electronically signed by: Gilmore Molt 10/24/2024 12:50 AM EST RP Workstation: [id number]   CT HEAD WO CONTRAST EXAM: CT HEAD WITHOUT CONTRAST 10/23/2024 08:12:13 PM TECHNIQUE: CT of the head was performed without the administration of intravenous contrast. Automated exposure control, iterative reconstruction, and/or weight based adjustment of the mA/kV was utilized to reduce the radiation dose to as low as reasonably achievable. COMPARISON: None available. CLINICAL HISTORY: Neuro deficit, acute, stroke suspected. FINDINGS: BRAIN AND VENTRICLES: No acute hemorrhage. Loss of gray-white matter differentiation of the right frontal lobe with associated edema. No hydrocephalus. No extra-axial collection. No midline shift. ORBITS: No acute abnormality. SINUSES: No acute abnormality. SOFT TISSUES AND SKULL: No acute soft tissue abnormality. No skull fracture. IMPRESSION: 1. 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  2. No intracranial hemorrhage. Electronically signed by: Morgane Naveau MD 10/23/2024 09:03 PM EST RP Workstation: [id number]        Scheduled Meds:  apixaban   5 mg Oral BID   atorvastatin   80 mg Oral Daily   feeding supplement  237 mL Oral BID BM   influenza vac split trivalent PF  0.5 mL Intramuscular Tomorrow-1000   insulin  aspart  0-15 Units Subcutaneous TID WC   insulin  aspart  0-5 Units Subcutaneous QHS   insulin  glargine  10 Units Subcutaneous Daily   pneumococcal 20-valent conjugate vaccine  0.5 mL Intramuscular Tomorrow-1000   sacubitril -valsartan   1 tablet Oral BID   spironolactone   25 mg Oral Daily   Continuous Infusions:   LOS: 3 days     Brayton Lye, MD [facility]   To contact the attending provider between 7A-7P or the covering provider during after hours 7P-7A, please log into the web site [email or web address] and access using universal  password for that web site. If you do not have the password, please call the  hospital operator.  10/26/2024, 12:45 PM   "

## 2024-10-26 NOTE — Progress Notes (Signed)
 Heart Failure Nurse Navigator Progress Note  PCP: Delbert Clam, MD PCP-Cardiologist: Court  Admission Diagnosis: None Admitted from: Home  Presentation:   Isaac Ray ( with help of Interpretor Jaime # [phone]) presented with left side weakness and facial droop that started on 10/21/24 at 6 am. Per family had slurred speech and was dropping things for 2 days, and a headache. Patient reports to not taking his medications for the last 4-5 months , due to traveling to [place] and not having it anymore. BP 137/63, HR 53, CT of head positive of an acute/subacute right frontal stroke. Brain MRI confirmed acute infarct in the right frontal lobe with smaller infarcts in the more posterior right frontal and parietal lobes.   Patient and his wife were educated on the sign and symptoms of heart failure, daily weights, when to call his doctor or go to the ED. Diet/ fluid restrictions . Patient reported to using salt when he cooks and drinking more then 64 ounces of fluid daily including around 4 Coke's per day. Continued education was done on the importance of taking all medication as prescribed and attending all medical appointments. Patient currently with no Insurance plan with discharge is [facility]. Both patient and his wife verbalized their understanding of all education. A HF TOC appointment was scheduled for 11/02/2024 @ 2 :45 pm.   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   Type 2 diabetes mellitus (HCC)    Type II     Social History   Socioeconomic History   Marital status: Married    Spouse name: nancy   Number of children: 3   Years of education: Not on file   Highest education level: Associate degree: academic program   Occupational History   Occupation: mechanical  Tobacco Use   Smoking status: Former    Current packs/day: 0.00    Types: Cigarettes    Start date: 02/15/1985    Quit date: 02/16/2015    Years since quitting: 9.6   Smokeless tobacco: Never  Vaping Use   Vaping status: Never Used  Substance and Sexual Activity   Alcohol use: No    Alcohol/week: 0.0 standard drinks of alcohol   Drug use: No   Sexual activity: Not Currently  Other Topics Concern   Not on file  Social History Narrative   Not on file   Social Drivers of Health   Tobacco Use: Medium Risk (10/23/2024)   Patient History    Smoking Tobacco Use: Former    Smokeless Tobacco Use: Never    Passive Exposure: Not on file  Financial Resource Strain: High Risk (10/26/2024)   Overall Financial Resource Strain (CARDIA)    Difficulty of Paying Living Expenses: Very hard  Food Insecurity: No Food Insecurity (10/24/2024)   Epic    Worried About Radiation Protection Practitioner of Food in the Last Year: Never true    Ran Out of Food in the Last Year: Never true  Transportation Needs: No Transportation Needs (10/24/2024)   Epic    Lack of Transportation (Medical): No    Lack of Transportation (Non-Medical): No  Physical Activity: Not on file  Stress: Not on file  Social Connections: Not on file  Depression (PHQ2-9): Not on file  Alcohol Screen: Low Risk (10/26/2024)   Alcohol Screen    Last Alcohol Screening Score (AUDIT): 0  Housing: Unknown (10/26/2024)   Epic    Unable to Pay for Housing in the Last Year: No    Number of Times Moved in the Last Year: Not on file    Homeless in the Last Year: No  Recent Concern: Housing - High Risk (10/24/2024)   Epic    Unable to Pay for Housing in the Last Year: No    Number of Times Moved in the Last Year: 1    Homeless in the Last Year: Yes  Utilities: Not At Risk (10/24/2024)   Epic    Threatened with loss of utilities: No  Health Literacy: Not on file   Education Assessment and  Provision:  Detailed education and instructions provided on heart failure disease management including the following:  Signs and symptoms of Heart Failure When to call the physician Importance of daily weights Low sodium diet Fluid restriction Medication management Anticipated future follow-up appointments  Patient education given on each of the above topics.  Patient acknowledges understanding via teach back method and acceptance of all instructions.  Education Materials:  Living Better With Heart Failure Booklet, HF zone tool, & Daily Weight Tracker Tool.  Patient has scale at home: Yes Patient has pill box at home: yes    High Risk Criteria for Readmission and/or Poor Patient Outcomes: Heart failure hospital admissions (last 6 months): 0  No Show rate: 32% Difficult social situation: No, lives with his wife  Demonstrates medication adherence: No, no medications for the last 4-5 months, he has been traveling from here to [place].  Primary Language: Spanish  Literacy level: Spanish, reading, writing, and comprehension   Barriers of Care:   Language barrier Medication compliance, costs , No Insurance  Diet/ fluid restrictions ( salt with cooking, drinks around 4 Coke's per day and water )  Daily weights  Considerations/Referrals:   Referral made to Heart Failure Pharmacist Stewardship: Yes, No Insurance  Referral made to Heart Failure CSW/NCM TOC: Na Referral made to Heart & Vascular TOC clinic: Yes, 11/02/2024 @ 2 :45 pm  Items for Follow-up on DC/TOC: Continued HF education Medication compliance and costs ( No Ins)  Diet/ fluid restrictions/ daily weights   Stephane Haddock, BSN, RN Heart Failure Teacher, Adult Education Only

## 2024-10-26 NOTE — Progress Notes (Signed)
" °  Heart Failure Stewardship Pharmacist Progress Note   PCP: Delbert Clam, MD PCP-Cardiologist: Dorn Lesches, MD    HPI:  53 yo M with PMH of CHF, CAD s/p CABG, LV thrombus, HTN, T2DM, and HLD.   Presented to the ED on 12/26 with L sided weakness and facial droop. Has been off medications for the last 4-5 months due to traveling to [place]. Underwent CT head which showed concern for acute frontal lobe infarct. Brain MRI confirmed acute infarct in the right frontal lobe with smaller infarcts in the more posterior right frontal and parietal lobes. ECHO 12/27 with LVEF 20-25% (was 25-30% in 02/2021), RWMA, G1DD, RV low normal. Source of acute CVA most likely cryptogenic in the setting of low EF.   Met with patient and his wife at bedside. [place] [phone] utilized. Denies chest pain, shortness of breath, lightheadedness or dizziness. No LE edema. Confirms he does not have current insurance and has been off his medications for the last several months. Denies any issues with taking medications prior. Is agreeable to take medications moving forward. Reviewed GDMT and goals of therapy. Agreeable to [facility] pharmacy on discharge.   Current HF Medications: ACE/ARB/ARNI: Entresto  24/26 mg BID MRA: spironolactone  25 mg daily  Prior to admission HF Medications: None  Pertinent Lab Values: Serum creatinine 0.94, BUN 19, Potassium 3.8, Sodium 137, A1c 8.6   Vital Signs: Weight: 164 lbs Blood pressure: 110/60s  Heart rate: 40-50s  I/O: incomplete  Medication Assistance / Insurance Benefits Check: Does the patient have prescription insurance?  No - states he is going to try to reactivate his Medicaid  Outpatient Pharmacy:  Prior to admission outpatient pharmacy: [facility] Is the patient willing to use [facility] TOC pharmacy at discharge? Yes Is the patient willing to transition their outpatient pharmacy to utilize a [facility] outpatient pharmacy?   Yes     Assessment: 1. Chronic systolic CHF (LVEF 20-25%), due to ICM. NYHA class I symptoms. - Not volume overloaded on exam - No BB with chronic sinus bradycardia - On Entresto  24/26 mg BID - uninsured - generic Entresto  is about $30-50 monthly - Continue spironolactone  25 mg daily - Consider adding Jardiance  10 mg daily prior to discharge    Plan: 1) Medication changes recommended at this time: - Add Jardiance  10 mg daily  2) Patient assistance: - No insurance - will send a message to [facility] to connect with financial counselors  3)  Education  - Patient has been educated on current HF medications and potential additions to HF medication regimen - Patient verbalizes understanding that over the next few months, these medication doses may change and more medications may be added to optimize HF regimen - Patient has been educated on basic disease state pathophysiology and goals of therapy   Duwaine Plant, PharmD, BCPS Heart Failure Stewardship Pharmacist Phone [phone]   "

## 2024-10-26 NOTE — Telephone Encounter (Addendum)
" °  Patient in hospital   Application scanned in media for patient signature -Isaac Ray to try to get him to sign today.  "

## 2024-10-26 NOTE — Plan of Care (Signed)
   Problem: Ischemic Stroke/TIA Tissue Perfusion: Goal: Complications of ischemic stroke/TIA will be minimized Outcome: Progressing

## 2024-10-26 NOTE — TOC Initial Note (Signed)
 Transition of Care [facility]) - Initial/Assessment Note    Patient Details  Name: Isaac Ray MRN: [id number] Date of Birth: 1970-12-17  Transition of Care [facility]) CM/SW Contact:    Landry DELENA Senters, RN Phone Number: 10/26/2024, 3:20 PM  Clinical Narrative:                 RR:[id number] history significant of CAD, hypertension, hyperal anemia who presented to emergency department with left-sided weakness.  Patient was dropping things on the left side and wife noted facial droop.   CM spoke with patient and family in room utilizing translator system. Patient lives at home with wife, has support, and family will transport home at d/c.   Patient has PCP, manages own medications, no DME at home. Patient reports having no insurance, he has previously had Medicaid. With patient permission, CM did send out info to screen for Medicaid.   Therapy rec for O/P PT/OT. CM sent referral to O/P neuro rehab center, info on AVS.   CM will continue to follow.   Expected Discharge Plan: OP Rehab Barriers to Discharge: Continued Medical Work up   Patient Goals and CMS Choice     Choice offered to / list presented to : Patient [place] ownership interest in [facility].provided to:: Patient    Expected Discharge Plan and Services       Living arrangements for the past 2 months: Single Family Home                                      Prior Living Arrangements/Services Living arrangements for the past 2 months: Single Family Home Lives with:: Self, Spouse Patient language and need for interpreter reviewed:: Yes Do you feel safe going back to the place where you live?: Yes      Need for Family Participation in Patient Care: Yes (Comment) Care giver support system in place?: Yes (comment)   Criminal Activity/Legal Involvement Pertinent to Current Situation/Hospitalization: No - Comment as needed  Activities of Daily Living   ADL Screening (condition at time of  admission) Independently performs ADLs?: Yes (appropriate for developmental age) Is the patient deaf or have difficulty hearing?: No Does the patient have difficulty seeing, even when wearing glasses/contacts?: No Does the patient have difficulty concentrating, remembering, or making decisions?: No  Permission Sought/Granted                  Emotional Assessment Appearance:: Developmentally appropriate Attitude/Demeanor/Rapport: Engaged Affect (typically observed): Calm Orientation: : Oriented to Self, Oriented to Place, Oriented to  Time, Oriented to Situation Alcohol / Substance Use: Not Applicable Psych Involvement: No (comment)  Admission diagnosis:  Facial droop [R29.810] CVA (cerebral vascular accident) [facility]) [I63.9] Patient Active Problem List   Diagnosis Date Noted   HFrEF (heart failure with reduced ejection fraction) (HCC) 10/26/2024   CVA (cerebral vascular accident) (HCC) 10/23/2024   Acute respiratory failure with hypoxia (HCC) 03/14/2021   Abdominal pain 03/14/2021   Long term (current) use of anticoagulants 04/30/2019   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   Acute appendicitis 04/25/2019   S/P laparoscopic appendectomy 04/25/2019   Gangrene of toe of left foot (HCC)    Critical lower limb ischemia (HCC) 03/30/2019   Ischemic cardiomyopathy 03/24/2019   Critical limb ischemia with history of revascularization of same extremity (HCC) 03/24/2019   Type 2 diabetes mellitus (HCC) 02/28/2017   PAOD (peripheral arterial  occlusive disease) 02/08/2017   Essential hypertension 04/20/2015   Hyperlipidemia 04/20/2015   Non-insulin  dependent type 2 diabetes mellitus (HCC) 04/20/2015   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   NSTEMI (non-ST elevated myocardial infarction) (HCC) 02/12/2015   PCP:  Delbert Clam, MD Pharmacy:   [facility] [address], [address] [place] [address] Phone: [phone] Fax:  [phone]  [facility] [place] [place] - [address] [place] [address] Phone: [phone] Fax: [phone]  [facility] [address] [place] [address] Phone: [phone] Fax: [phone]     Social Drivers of Health (SDOH) Social History: SDOH Screenings   Food Insecurity: No Food Insecurity (10/24/2024)  Housing: Unknown (10/26/2024)  Recent Concern: Housing - High Risk (10/24/2024)  Transportation Needs: No Transportation Needs (10/24/2024)  Utilities: Not At Risk (10/24/2024)  Alcohol Screen: Low Risk (10/26/2024)  Financial Resource Strain: High Risk (10/26/2024)  Tobacco Use: Medium Risk (10/23/2024)   SDOH Interventions: Housing Interventions: Intervention Not Indicated Alcohol Usage Interventions: Intervention Not Indicated (Score <7) Financial Strain Interventions: Intervention Not Indicated   Readmission Risk Interventions     No data to display

## 2024-10-26 NOTE — Progress Notes (Signed)
 Occupational Therapy Treatment Patient Details Name: Isaac Ray MRN: [id number] DOB: [birth year] Today's Date: 10/26/2024   History of present illness 53 y.o. male presented 12/26 after waking up on 10/21/2024 with L sided weakness. Dropping things from L hand, walking is off and wife has noted L facial droop. MRI brain shows acute infarct in right frontal lobe with smaller acute infarcts in the right more posterior right frontal and parietal lobes. PMH: of CAD s/p CABG, HTN, DM2, HLD.   OT comments  Patient largely Mod I to light supervision with spouse assisting as needed.  No significant OT deficits in the acute setting.  Will defer mobility to PT.  Defer to outpatient OT as needed.        If plan is discharge home, recommend the following:  Assistance with cooking/housework;Help with stairs or ramp for entrance;Assist for transportation   Equipment Recommendations       Recommendations for Other Services      Precautions / Restrictions Precautions Precautions: Fall Recall of Precautions/Restrictions: Intact Precaution/Restrictions Comments: left inattention Restrictions Weight Bearing Restrictions Per Provider Order: No       Mobility Bed Mobility Overal bed mobility: Independent                  Transfers Overall transfer level: Modified independent Equipment used: None                     Balance Overall balance assessment: Mild deficits observed, not formally tested                                         ADL either performed or assessed with clinical judgement   ADL       Grooming: Modified independent;Standing           Upper Body Dressing : Set up;Sitting;Modified independent   Lower Body Dressing: Supervision/safety;Sit to/from stand;Modified independent   Toilet Transfer: Supervision/safety;Ambulation;Modified Independent;Regular Toilet                  Extremity/Trunk Assessment Upper  Extremity Assessment Upper Extremity Assessment: Right hand dominant;Left hand dominant LUE Deficits / Details: slowed coordination, but uses functionally LUE Sensation: WNL LUE Coordination: decreased fine motor   Lower Extremity Assessment Lower Extremity Assessment: Defer to PT evaluation   Cervical / Trunk Assessment Cervical / Trunk Assessment: Normal    Vision   Alignment/Gaze Preference: Head turned   Perception Perception Perception: Impaired Preception Impairment Details: Inattention/Neglect   Praxis Praxis Praxis: WFL   Communication Communication Communication: Impaired Factors Affecting Communication: Non - English speaking, interpreter not available   Cognition Arousal: Alert Behavior During Therapy: WFL for tasks assessed/performed Cognition: No apparent impairments                               Following commands: Intact        Cueing   Cueing Techniques: Verbal cues  Exercises      Shoulder Instructions       General Comments      Pertinent Vitals/ Pain       Pain Assessment Pain Assessment: No/denies pain  Frequency           Progress Toward Goals  OT Goals(current goals can now be found in the care plan section)  Progress towards OT goals: Goals met/education completed, patient discharged from OT  Acute Rehab OT Goals OT Goal Formulation: With patient Time For Goal Achievement: 11/07/24 Potential to Achieve Goals: Good  Plan      Co-evaluation                 AM-PAC OT 6 Clicks Daily Activity     Outcome Measure   Help from another person eating meals?: None Help from another person taking care of personal grooming?: None Help from another person toileting, which includes using toliet, bedpan, or urinal?: None Help from another person bathing (including washing, rinsing, drying)?: None Help from another person to put  on and taking off regular upper body clothing?: None Help from another person to put on and taking off regular lower body clothing?: None 6 Click Score: 24    End of Session    OT Visit Diagnosis: Muscle weakness (generalized) (M62.81) Hemiplegia - Right/Left: Left Hemiplegia - dominant/non-dominant: Non-Dominant Hemiplegia - caused by: Cerebral infarction   Activity Tolerance Patient tolerated treatment well   Patient Left in bed;with call bell/phone within reach;with family/visitor present   Nurse Communication Mobility status        Time: 9086-9074 OT Time Calculation (min): 12 min  Charges: OT General Charges $OT Visit: 1 Visit OT Treatments $Self Care/Home Management : 8-22 mins  10/26/2024  RP, OTR/L  Acute Rehabilitation Services  Office:  [phone]   Isaac Ray 10/26/2024, 9:29 AM

## 2024-10-27 ENCOUNTER — Other Ambulatory Visit (HOSPITAL_COMMUNITY): Payer: Self-pay

## 2024-10-27 LAB — BASIC METABOLIC PANEL WITH GFR
Anion gap: 11 (ref 5–15)
BUN: 18 mg/dL (ref 6–20)
CO2: 21 mmol/L — ABNORMAL LOW (ref 22–32)
Calcium: 9.3 mg/dL (ref 8.9–10.3)
Chloride: 105 mmol/L (ref 98–111)
Creatinine, Ser: 1.01 mg/dL (ref 0.61–1.24)
GFR, Estimated: >60 mL/min
Glucose, Bld: 136 mg/dL — ABNORMAL HIGH (ref 70–99)
Potassium: 3.8 mmol/L (ref 3.5–5.1)
Sodium: 137 mmol/L (ref 135–145)

## 2024-10-27 LAB — CBC
HCT: 44.3 % (ref 39.0–52.0)
Hemoglobin: 15.3 g/dL (ref 13.0–17.0)
MCH: 31.4 pg (ref 26.0–34.0)
MCHC: 34.5 g/dL (ref 30.0–36.0)
MCV: 90.8 fL (ref 80.0–100.0)
Platelets: 212 K/uL (ref 150–400)
RBC: 4.88 MIL/uL (ref 4.22–5.81)
RDW: 12.5 % (ref 11.5–15.5)
WBC: 8.9 K/uL (ref 4.0–10.5)
nRBC: 0 % (ref 0.0–0.2)

## 2024-10-27 LAB — GLUCOSE, CAPILLARY
Glucose-Capillary: 146 mg/dL — ABNORMAL HIGH (ref 70–99)
Glucose-Capillary: 127 mg/dL — ABNORMAL HIGH (ref 70–99)

## 2024-10-27 MED ORDER — SACUBITRIL-VALSARTAN 24-26 MG PO TABS
1.0000 | ORAL_TABLET | Freq: Two times a day (BID) | ORAL | 0 refills | Status: DC
  Filled 2024-10-27: qty 60, 30d supply, fill #0

## 2024-10-27 MED ORDER — ATORVASTATIN CALCIUM 80 MG PO TABS
80.0000 mg | ORAL_TABLET | Freq: Every day | ORAL | 0 refills | Status: DC
  Filled 2024-10-27: qty 30, 30d supply, fill #0

## 2024-10-27 MED ORDER — METFORMIN HCL 1000 MG PO TABS
1000.0000 mg | ORAL_TABLET | Freq: Two times a day (BID) | ORAL | 0 refills | Status: DC
  Filled 2024-10-27: qty 60, 30d supply, fill #0

## 2024-10-27 MED ORDER — APIXABAN 5 MG PO TABS
5.0000 mg | ORAL_TABLET | Freq: Two times a day (BID) | ORAL | 0 refills | Status: DC
  Filled 2024-10-27: qty 60, 30d supply, fill #0

## 2024-10-27 MED ORDER — GLIPIZIDE ER 2.5 MG PO TB24
2.5000 mg | ORAL_TABLET | Freq: Every day | ORAL | 0 refills | Status: DC
  Filled 2024-10-27: qty 30, 30d supply, fill #0

## 2024-10-27 MED ORDER — DAPAGLIFLOZIN PROPANEDIOL 5 MG PO TABS
5.0000 mg | ORAL_TABLET | Freq: Every day | ORAL | 0 refills | Status: DC
  Filled 2024-10-27: qty 30, 30d supply, fill #0

## 2024-10-27 MED ORDER — EMPAGLIFLOZIN 10 MG PO TABS
10.0000 mg | ORAL_TABLET | Freq: Every day | ORAL | 0 refills | Status: DC
  Filled 2024-10-27: qty 30, 30d supply, fill #0

## 2024-10-27 NOTE — Progress Notes (Addendum)
 "  Rounding Note   Patient Name: Isaac Ray Date of Encounter: 10/27/2024  [facility] Cardiologist: Dorn Lesches, MD   Subjective  Patient is doing well this morning with no acute cardiac complaints. He is asymptomatic this morning. He has been living in [place] for the past 3 years and was not taking any medications while there. He reports some intermittent brief episode of palpitations prior to admission as well as random episodes of near syncope (that are not related to the palpitations). However, no overt syncope. He denies having any chest pain, shortness of breath, or edema prior to admission.  Scheduled Meds:  apixaban   5 mg Oral BID   atorvastatin   80 mg Oral Daily   feeding supplement  237 mL Oral BID BM   influenza vac split trivalent PF  0.5 mL Intramuscular Tomorrow-1000   insulin  aspart  0-15 Units Subcutaneous TID WC   insulin  aspart  0-5 Units Subcutaneous QHS   insulin  glargine  10 Units Subcutaneous Daily   pneumococcal 20-valent conjugate vaccine  0.5 mL Intramuscular Tomorrow-1000   sacubitril -valsartan   1 tablet Oral BID   Continuous Infusions:  PRN Meds: acetaminophen  **OR** acetaminophen , guaiFENesin -dextromethorphan , ondansetron  **OR** ondansetron  (ZOFRAN ) IV   Vital Signs  Vitals:   10/27/24 0000 10/27/24 0013 10/27/24 0200 10/27/24 0400  BP: (S) (!) 73/49 (!) 108/50  (!) 119/97  Pulse:  (!) 47  (!) 48  Resp: 19 (!) 22 19 18   Temp:  (!) 97.3 F (36.3 C)  97.7 F (36.5 C)  TempSrc:  Oral  Oral  SpO2:      Weight:      Height:        Intake/Output Summary (Last 24 hours) at 10/27/2024 0734 Last data filed at 10/26/2024 0840 Gross per 24 hour  Intake 240 ml  Output --  Net 240 ml      10/23/2024    9:41 PM 03/21/2021    9:54 AM 03/17/2021    5:21 AM  Last 3 Weights  Weight (lbs) 164 lb 14.5 oz 165 lb 159 lb 6.3 oz  Weight (kg) 74.8 kg 74.844 kg 72.3 kg      Telemetry SB/SR  - Personally Reviewed  ECG  No new  ECG tracing today. - Personally Reviewed  Physical Exam  GEN: No acute distress.   Neck: JVP is normal  Cardiac: RRR  No significant murmurs  Respiratory: Clear to auscultation bilaterally. MS: No lower extremity edema. Neuro  Deferred .   Labs High Sensitivity Troponin:  No results for input(s): TROPONINIHS in the last 720 hours. No results for input(s): TRNPT in the last 720 hours.     Chemistry Recent Labs  Lab 10/23/24 1957 10/26/24 0455 10/27/24 0355  NA 137 137 137  K 4.2 3.8 3.8  CL 103 107 105  CO2 23 19* 21*  GLUCOSE 147* 128* 136*  BUN 21* 19 18  CREATININE 1.45* 0.94 1.01  CALCIUM  10.0 9.1 9.3  PROT 7.9  --   --   ALBUMIN  4.5  --   --   AST 19  --   --   ALT 15  --   --   ALKPHOS 70  --   --   BILITOT 0.5  --   --   GFRNONAA 57* >60 >60  ANIONGAP 11 11 11     Lipids  Recent Labs  Lab 10/24/24 0458  CHOL 152  TRIG 118  HDL 27*  LDLCALC 102*  CHOLHDL 5.7  Hematology Recent Labs  Lab 10/23/24 1957 10/26/24 0455 10/27/24 0355  WBC 10.1 9.7 8.9  RBC 5.18 4.99 4.88  HGB 16.4 16.0 15.3  HCT 47.7 45.0 44.3  MCV 92.1 90.2 90.8  MCH 31.7 32.1 31.4  MCHC 34.4 35.6 34.5  RDW 12.9 12.6 12.5  PLT 225 210 212   Thyroid  Recent Labs  Lab 10/26/24 0455  TSH 1.920    BNPNo results for input(s): BNP, PROBNP in the last 168 hours.  DDimer No results for input(s): DDIMER in the last 168 hours.   Radiology  No results found.   Cardiac Studies  Echocardiogram 10/24/2024: Impressions: 1. Left ventricular ejection fraction, by estimation, is 20 to 25%. The  left ventricle has severely decreased function. The left ventricle  demonstrates regional wall motion abnormalities (see scoring  diagram/findings for description). The left  ventricular internal cavity size was severely dilated. Left ventricular  diastolic parameters are consistent with Grade I diastolic dysfunction  (impaired relaxation).   2. Right ventricular systolic function  is low normal. The right  ventricular size is not well visualized.   3. The mitral valve is normal in structure. No evidence of mitral valve  regurgitation. No evidence of mitral stenosis.   4. The aortic valve is tricuspid. Aortic valve regurgitation is not  visualized.    Patient Profile   53 y.o. 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 Cardiology was consulted on 10/25/2024 for evaluation of CHF.  Assessment & Plan   Acute CVA Patient presented with left sided weakness and facial droop. Brain MRI showed an acute infarct in the right frontal lobe with small acute infarcts in the more posterior right frontal and parietal lobes. Head/ neck CTA showed near occlusion of the right MCA bi/trifurcation that appear related to acute thromboembolic disease. Echo showed severely reduced LVEF but no definitive LV thrombus (although he does have a history of this and stopped taking Warfarin about 2 years ago). - Left sided weakness and facial droop almost completely resolved. - He was initially started on DAPT with Aspirin  and Plavix  but has now been transitioned to Eliquis  5mg  twice daily as stroke is suspected to be cardioembolic in nature.  - Continue Lipitor  80mg  daily. - Otherwise, management per Neurology vs primary team.  Chronic HFrEF Patient has a long history of ischemic cardiomyopathy with chronic HFrEF. EF did not improve after  revascularization with CABG. Last Echo in 02/2021 showed LVEF of 25-30% Echo this admit  LV again is severely dilated as before   LVEF is severely depressed    I am not convinced of signficant change Pt appears euvolemic on exam.  - Continue Entresto  24-26mg  twice daily.  Just started 2 days ago No other meds for now given rel low BP and low HR    Follow as outpt - Patient would benefit from ICD for primary prevention of sudden cardiac death but he does not have insurance so not sure if this will be an option. Will check  with EP team. Re possible BiV ICD  CAD s/p CABG S/p CABG x5 in 2016.  - Pt denies chest pain - No antiplatelet therapy given need for Eliquis  as above.   Palpitations Bradycardia Dizziness He reported intermittent brief episodes of palpitations and random episodes of near syncope (unrelated to episodes of palpitations at home) at home.  - Telemetry shows sinus bradycardia/ junctional bradycardia with rates in the 40s at times. No high grade AV block or significant pauses noted. - Not on any AV nodal agents.  - Orthostatics were negative yesterday   - Will follow as outpt  Hypertension BP low at midnight, otherwise OK Follow on current regimen for now   If drops low again, would back off of Entresto  and use losartan 25 mg  -   Hyperlipidemia Lipid panel in 09/2024: Total Cholesterol 152, Triglycerides 118, HDL 27, LDL 102. LDL goal <55.  - He has been restarted on Lipitor  80mg  daily.  - Will need repeat lipid panel and LFTs in 6-8 weeks.   Type 2 Diabetes Mellitus Hemoglobin A1c 8.6% this admission.  Poorly controlled  Stressed importance of glucose control with pt and family   - Management per PCP.  Will make sure pt has follow up when time for D/C  For questions or updates, please contact [facility] Please consult [email or web address] for contact info under   Signed, Vina Gull, MD  10/27/2024, 7:34 AM      "

## 2024-10-27 NOTE — Plan of Care (Signed)

## 2024-10-27 NOTE — Progress Notes (Signed)
 Isaac Ray to be discharged home per MD order. Discussed with the patient and wife using translator and all questions fully answered. TOC meds to be picked up from pharmacy. Skin clean, dry, and intact without evidence of skin break down. IV catheter discontinued intact. Site without signs and symptoms of complications. Dressing and pressure applied.  An After Visit Summary was printed and given to the patient.  Patient escorted via [facility] to discharge lounge. [name]  10/27/2024

## 2024-10-27 NOTE — Discharge Summary (Signed)
 Physician Discharge Summary  Isaac Ray FMW:[id number] DOB: 1970/10/31 DOA: 10/23/2024  PCP: Delbert Clam, MD  Admit date: 10/23/2024 Discharge date: 10/27/2024  Admitted From: (Home) Disposition:  (Home )  Recommendations for Outpatient Follow-up:  Follow up with PCP in 1-2 weeks Please obtain BMP/CBC in one week Need repeat LFTs and lipid panel in 4 to 6 weeks Please try to to follow-up with cardiology and CHF team as an outpatient  Diet recommendation: Heart Healthy / Carb Modified   Brief/Interim Summary:  [name] is a 53 y.o. male with hx of CAD s/p CABG, HTN, DM2, HLD who woke up on 10/21/2024 with L sided weakness. Dropping things from L hand, walking is off and wife has noted L facial droop. He was coming back from [place] and did not think too much about it. Symptoms persistent so he came to the ED.   He denies any prior hx of stroke, mother had strokes. Does not use any recreational substances.  Denies any prior history of atrial fibrillation.  Diagnosed with a stroke.  Admitted for further workup, 2D echo showing EF at 20%, cardiology team were involved as well, please see discussion below.    Acute CVA - Source most likely cryptogenic in the setting of low EF 20% with significant regional wall motion abnormalities with history of LV thrombus noncompliant with medication, stopped taking his warfarin 2 years ago with poor follow-up when he traveled to [place]. - Recommendation for aspirin  and Plavix , but after 2D echo findings he was transition to full dose anticoagulation as discussed with cardiology and neurology, he is currently on Eliquis .   - At this point no need for TEE as recommendation for full anticoagulation regardless to TEE  finding as discussed with both neurology and cardiology. - LDL is 102, continue with atorvastatin  80 mg daily. - A1c is 8.6   Diabetes mellitus, type II, poorly controlled with A1c of 8.6 -On glipizide  and  metformin  at home.     History of CAD status post CABG Chronic systolic heart failure Ischemic cardiomyopathy with EF 20 to 25% - Cardiology input greatly appreciated, currently on Eliquis , please see above discussion . - Bradycardic, so no beta-blockers  - Continue with Entresto   - Started on low-dose Jardiance  as well -Blood pressure is soft, so no room for other meds currently   Bradycardia - TSH within normal limit - No beta-blockers or AV blocking agents, heart rate in the 40s during hospital stay, was sinus bradycardia/junctional rhythm on telemetry.  He was not orthostatic, further follow-up and recommendation by cardiology as an outpatient.  Hyperlipidemia - LDL 102, continue with statin Will need repeat LFTs and lipid panel in 6 to 8 weeks.  Patient was provided all meds prior to discharge from [facility] pharmacy as he received either match letter, or free cards or assistant form, currently his Medicaid reinstatement is pending and his daughter is working on it.     Discharge Diagnoses:  Principal Problem:   CVA (cerebral vascular accident) (HCC) Active Problems:   HFrEF (heart failure with reduced ejection fraction) [facility])    Discharge Instructions  Discharge Instructions     Ambulatory referral to Occupational Therapy   Complete by: As directed    Ambulatory referral to Physical Therapy   Complete by: As directed    Increase activity slowly   Complete by: As directed       Allergies as of 10/27/2024   No Known Allergies      Medication List  STOP taking these medications    acetaminophen  325 MG tablet Commonly known as: TYLENOL    carvedilol  3.125 MG tablet Commonly known as: COREG    spironolactone  25 MG tablet Commonly known as: ALDACTONE    warfarin 5 MG tablet Commonly known as: COUMADIN        TAKE these medications    apixaban  5 MG Tabs tablet Commonly known as: ELIQUIS  Take 1 tablet (5 mg total) by mouth 2 (two) times daily.    atorvastatin  80 MG tablet Commonly known as: LIPITOR  Tome 1 tableta (80 mg en total) por va oral diariamente. (Take 1 tablet (80 mg total) by mouth daily.)   empagliflozin  10 MG Tabs tablet Commonly known as: Jardiance  Take 1 tablet (10 mg total) by mouth daily before breakfast.   glipiZIDE  2.5 MG 24 hr tablet Commonly known as: GLUCOTROL  XL Tome 1 tableta (2.5 mg en total) por va oral diariamente con el desayuno. (Take 1 tablet (2.5 mg total) by mouth daily with breakfast.)   metFORMIN  1000 MG tablet Commonly known as: GLUCOPHAGE  Take 1 tablet (1,000 mg total) by mouth 2 (two) times daily with a meal.   sacubitril -valsartan  24-26 MG Commonly known as: ENTRESTO  Take 1 tablet by mouth 2 (two) times daily.        Contact information for follow-up providers     [facility]. Go in 7 day(s).   Specialty: Cardiology Why: Hospital follow up 11/02/2024 @ 2:45 pm PLEASE bring a current medication list to appointment FREE valet parking, Entrance C, off [employer] for women and [facility] entrance Contact information: [address] [place]  [address] [phone]             Contact information for after-discharge care     Destination     [facility] Schedule an appointment as soon as possible for a visit.   Service: Inpatient Rehabilitation Contact information: [address] [place]  [address] [phone]                    Allergies[1]  Consultations: Cardiology Neurology   Procedures/Studies: ECHOCARDIOGRAM COMPLETE Result Date: 10/24/2024    ECHOCARDIOGRAM REPORT   Patient Name:   Isaac Ray Date of Exam: 10/24/2024 Medical Rec #:  [id number]               Height:       67.0 in Accession #:    [id number]              Weight:       164.9 lb Date of Birth:  08-27-1971              BSA:          1.863 m  Patient Age:    53 years                BP:           121/75 mmHg Patient Gender: M                       HR:           45 bpm. Exam Location:  Inpatient Procedure: 2D Echo, Color Doppler, Cardiac Doppler and Intracardiac            Opacification Agent (Both Spectral and Color Flow Doppler were            utilized during procedure).  Indications:    Stroke I63.9  History:        Patient has prior history of Echocardiogram examinations, most                 recent 03/14/2021. CAD, Signs/Symptoms:Hypertensive Heart                 Disease; Risk Factors:Diabetes.  Sonographer:    Sydnee Wilson RDCS Referring Phys: [id number] ROBERT DORRELL IMPRESSIONS  1. Left ventricular ejection fraction, by estimation, is 20 to 25%. The left ventricle has severely decreased function. The left ventricle demonstrates regional wall motion abnormalities (see scoring diagram/findings for description). The left ventricular internal cavity size was severely dilated. Left ventricular diastolic parameters are consistent with Grade I diastolic dysfunction (impaired relaxation).  2. Right ventricular systolic function is low normal. The right ventricular size is not well visualized.  3. The mitral valve is normal in structure. No evidence of mitral valve regurgitation. No evidence of mitral stenosis.  4. The aortic valve is tricuspid. Aortic valve regurgitation is not visualized. Comparison(s): Prior images reviewed side by side. LVEF has further decreased. Conclusion(s)/Recommendation(s): No LV thrombus is appreciated on images by substrate for LV thrombus exists. FINDINGS  Left Ventricle: Left ventricular ejection fraction, by estimation, is 20 to 25%. The left ventricle has severely decreased function. The left ventricle demonstrates regional wall motion abnormalities. The left ventricular internal cavity size was severely dilated. There is no left ventricular hypertrophy. Left ventricular diastolic parameters are consistent with Grade I  diastolic dysfunction (impaired relaxation).  LV Wall Scoring: The entire anterior wall, antero-lateral wall, entire anterior septum, apical lateral segment, and apical inferior segment are akinetic. The inferior wall, posterior wall, mid inferoseptal segment, and basal inferoseptal segment are hypokinetic. Right Ventricle: The right ventricular size is not well visualized. No increase in right ventricular wall thickness. Right ventricular systolic function is low normal. Left Atrium: Left atrial size was normal in size. Right Atrium: Right atrial size was normal in size. Pericardium: There is no evidence of pericardial effusion. Mitral Valve: The mitral valve is normal in structure. No evidence of mitral valve regurgitation. No evidence of mitral valve stenosis. Tricuspid Valve: The tricuspid valve is normal in structure. Tricuspid valve regurgitation is not demonstrated. No evidence of tricuspid stenosis. Aortic Valve: The aortic valve is tricuspid. Aortic valve regurgitation is not visualized. Aortic valve mean gradient measures 8.0 mmHg. Aortic valve peak gradient measures 15.7 mmHg. Aortic valve area, by VTI measures 2.43 cm. Pulmonic Valve: The pulmonic valve was normal in structure. Pulmonic valve regurgitation is not visualized. No evidence of pulmonic stenosis. Aorta: The aortic root and ascending aorta are structurally normal, with no evidence of dilitation. IAS/Shunts: No atrial level shunt detected by color flow Doppler.  LEFT VENTRICLE PLAX 2D LVIDd:         6.80 cm      Diastology LVIDs:         6.30 cm      LV e' medial:    5.77 cm/s LV PW:         1.10 cm      LV E/e' medial:  12.5 LV IVS:        1.00 cm      LV e' lateral:   7.29 cm/s LVOT diam:     2.00 cm      LV E/e' lateral: 9.9 LV SV:         90 LV SV Index:   48 LVOT Area:  3.14 cm  LV Volumes (MOD) LV vol d, MOD A2C: 288.0 ml LV vol d, MOD A4C: 273.0 ml LV vol s, MOD A2C: 242.0 ml LV vol s, MOD A4C: 211.0 ml LV SV MOD A2C:     46.0 ml LV  SV MOD A4C:     273.0 ml LV SV MOD BP:      57.6 ml RIGHT VENTRICLE RV S prime:     10.20 cm/s TAPSE (M-mode): 2.1 cm LEFT ATRIUM             Index        RIGHT ATRIUM           Index LA diam:        4.00 cm 2.15 cm/m   RA Area:     13.80 cm LA Vol (A2C):   25.2 ml 13.53 ml/m  RA Volume:   25.60 ml  13.74 ml/m LA Vol (A4C):   37.0 ml 19.86 ml/m LA Biplane Vol: 30.6 ml 16.42 ml/m  AORTIC VALVE AV Area (Vmax):    2.59 cm AV Area (Vmean):   2.22 cm AV Area (VTI):     2.43 cm AV Vmax:           198.00 cm/s AV Vmean:          126.000 cm/s AV VTI:            0.371 m AV Peak Grad:      15.7 mmHg AV Mean Grad:      8.0 mmHg LVOT Vmax:         163.00 cm/s LVOT Vmean:        89.000 cm/s LVOT VTI:          0.287 m LVOT/AV VTI ratio: 0.77  AORTA Ao Root diam: 2.80 cm Ao Asc diam:  2.50 cm MITRAL VALVE MV Area (PHT): 1.71 cm     SHUNTS MV Decel Time: 443 msec     Systemic VTI:  0.29 m MV E velocity: 72.30 cm/s   Systemic Diam: 2.00 cm MV A velocity: 105.00 cm/s MV E/A ratio:  0.69 Stanly Leavens MD Electronically signed by Stanly Leavens MD Signature Date/Time: 10/24/2024/12:39:30 PM    Final    CT ANGIO HEAD NECK W WO CM (CODE STROKE) Result Date: 10/24/2024 EXAM: CTA Head and Neck with Intravenous Contrast. CT Head without Contrast. CLINICAL HISTORY: 53 year old male with neurological deficit, R MCA infarcts on CT and MRI TECHNIQUE: Axial CTA images of the head and neck performed with intravenous contrast. MIP reconstructed images were created and reviewed. Axial computed tomography images of the head/brain performed without intravenous contrast. Note: Per PQRS, the description of internal carotid artery percent stenosis, including 0 percent or normal exam, is based on North American Symptomatic Carotid Endarterectomy Trial (NASCET) criteria. Dose reduction technique was used including one or more of the following: automated exposure control, adjustment of mA and kV according to patient size, and/or  iterative reconstruction. CONTRAST: Without and with; 75 mL iohexol  (OMNIPAQUE ) 350 MG/ML injection. COMPARISON: CT Head 10/23/2024, MRI 10/24/2024 00:09 AM FINDINGS: CT HEAD: BRAIN: Confluent cytotoxic edema in the anterior right MCA territory. Patchy additional operculum hypodensity appears unchanged from the CT 10/23/2024. Diffusion restriction in those areas and scattered elsewhere in the posterior right MCA territory which is occult by CT. No acute intraparenchymal hemorrhage. No mass lesion. No midline shift or extra-axial collection. No new cerebral edema. VENTRICLES: No hydrocephalus. ORBITS: The orbits are unremarkable. SINUSES AND MASTOIDS: The paranasal sinuses,  tympanic cavities, and mastoid air cells remain well aerated. CTA NECK: AORTIC ARCH: 3 vessel aortic arch with moderate arch atherosclerosis. Mild proximal right subclavian artery atherosclerosis without stenosis. Mild to moderate proximal left subclavian atherosclerosis without stenosis. COMMON CAROTID ARTERIES: No significant stenosis. No dissection or occlusion. Intermittent atherosclerosis, most pronounced at the right carotid bifurcation. No significant left carotid stenosis. INTERNAL CAROTID ARTERIES: Right ICA: No stenosis by NASCET criteria. No dissection or occlusion. Negative right ICA siphon. Left ICA: Soft and calcified plaque at the origin with less than 50% stenosis. Mild siphon soft and calcified plaque without significant stenosis. No dissection or occlusion. VERTEBRAL ARTERIES: Fairly codominant vertebral arteries. Left vertebral V2 calcified plaque occasionally noted. No left vertebral artery stenosis to the vertebrobasilar junction. Normal left vertebral artery origin. Minimal right vertebral artery V2 segment plaque. The right vertebral V4 segment is mildly dominant. No right vertebral stenosis. Normal right vertebral artery origin. No dissection or occlusion. CTA HEAD: ANTERIOR CEREBRAL ARTERIES: Normal ACA origins. No  significant stenosis. No occlusion. No aneurysm. MIDDLE CEREBRAL ARTERIES: Normal MCA origins. Right MCA: Near occlusion of the bifurcation or trifurcation (series 13 image 115), but with most of the right MCA M2 branches reconstituted, and less apparent in the other imaging planes. However, the dominant anterior right M2 division is likely occluded, with a paucity of anterior and middle division right MCA branches as seen on series 16 image 23. The right M1 segment proximal to that is patent without stenosis. Normal right anterior temporal artery origin. Left MCA: No significant stenosis. No occlusion. No aneurysm. POSTERIOR CEREBRAL ARTERIES: Mild right PCA P2 segment irregularity without stenosis. Normal PCA origins. No occlusion. No aneurysm. BASILAR ARTERY: No significant stenosis. No occlusion. No aneurysm. OTHER: Diminutive or absent posterior communicating arteries. Normal anterior communicating artery. Normal right PICA origin. Normal SCA origins. Normal PCA origins. SOFT TISSUES: Nonvascular neck soft tissue spaces are within normal limits. Partially visible sequelae of CABG. Mild pulmonary hyperinflation. Aspirated or retained secretions at the carina and in the right mainstem bronchus (series 11 image 183), but no visible lung opacity. BONES: Previous sternotomy. Ordinary cervical spine degeneration. No acute osseous abnormality. INTRACRANIAL VENOUS: Early intracranial venous contrast timing, not well evaluated. IMPRESSION: 1. Near occlusion of the right MCA bi/trifurcation appears related to acute thromboembolic disease (ELVO), but with Reconstituted right MCA branches except for some of the anterior and middle divisions. 2. Other atherosclerosis in the head and neck, but no other hemodynamically significant stenosis. Prior CABG. 3. Unchanged CT appearance of right MCA infarct since yesterday, no hemorrhagic transformation transformation or significant mass effect. 4. Salient findings communicated by  text page to Dr. Vanessa, who called back and we briefly discussed at 05:49 hours on 10/24/2024. Electronically signed by: Helayne Hurst MD 10/24/2024 05:54 AM EST RP Workstation: [id number]   MR Brain W and Wo Contrast Result Date: 10/24/2024 EXAM: MRI BRAIN WITH AND WITHOUT CONTRAST 10/24/2024 12:31:00 AM TECHNIQUE: Multiplanar multisequence MRI of the head/brain was performed with and without the administration of intravenous contrast. COMPARISON: CT head 10/23/2024. CLINICAL HISTORY: Neuro deficit, acute, stroke suspected FINDINGS: BRAIN AND VENTRICLES: Acute infarct in the right frontal lobe with smaller acute infarcts in the more posterior right frontal and parietal lobes. No midline shift. No acute intracranial hemorrhage. No hydrocephalus. The sella is unremarkable. Normal flow voids. No mass or abnormal enhancement. ORBITS: No acute abnormality. SINUSES: No acute abnormality. BONES AND SOFT TISSUES: Normal bone marrow signal and enhancement. No acute soft tissue abnormality. IMPRESSION: 1. Acute  infarct in the right frontal lobe with smaller acute infarcts in the more posterior right frontal and parietal lobes. Electronically signed by: Gilmore Molt 10/24/2024 12:50 AM EST RP Workstation: [id number]   CT HEAD WO CONTRAST Addendum Date: 10/23/2024  ADDENDUM: These findings were discussed with Dr. Bari over the phone by Dr. Margarite on 10/23/24 at 9:07 pm ---------------------------------------------------- Electronically signed by: Kate Margarite MD 10/23/2024 09:08 PM EST RP Workstation: [id number]   Result Date: 10/23/2024  EXAM: CT HEAD WITHOUT CONTRAST 10/23/2024 08:12:13 PM TECHNIQUE: CT of the head was performed without the administration of intravenous contrast. Automated exposure control, iterative reconstruction, and/or weight based adjustment of the mA/kV was utilized to reduce the radiation dose to as low as reasonably achievable. COMPARISON: None available. CLINICAL HISTORY: Neuro  deficit, acute, stroke suspected. FINDINGS: BRAIN AND VENTRICLES: No acute hemorrhage. Loss of gray-white matter differentiation of the right frontal lobe with associated edema. No hydrocephalus. No extra-axial collection. No midline shift. ORBITS: No acute abnormality. SINUSES: No acute abnormality. SOFT TISSUES AND SKULL: No acute soft tissue abnormality. No skull fracture. IMPRESSION: 1. 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 2. No intracranial hemorrhage. Electronically signed by: Kate Margarite MD 10/23/2024 09:03 PM EST RP Workstation: [id number]      Subjective: Denies any complaints this morning  Discharge Exam: Vitals:   10/27/24 0730 10/27/24 1133  BP: 120/65 (!) 112/51  Pulse: (!) 57 (!) 50  Resp: 15 12  Temp: 97.6 F (36.4 C) 97.8 F (36.6 C)  SpO2:     Vitals:   10/27/24 0200 10/27/24 0400 10/27/24 0730 10/27/24 1133  BP:  (!) 119/97 120/65 (!) 112/51  Pulse:  (!) 48 (!) 57 (!) 50  Resp: 19 18 15 12   Temp:  97.7 F (36.5 C) 97.6 F (36.4 C) 97.8 F (36.6 C)  TempSrc:  Oral Oral Oral  SpO2:      Weight:      Height:        General: Pt is alert, awake, not in acute distress Cardiovascular: Bradycardic, S1/S2 +, no rubs, no gallops Respiratory: CTA bilaterally, no wheezing, no rhonchi Abdominal: Soft, NT, ND, bowel sounds + Extremities: no edema, no cyanosis    The results of significant diagnostics from this hospitalization (including imaging, microbiology, ancillary and laboratory) are listed below for reference.     Microbiology: No results found for this or any previous visit (from the past 240 hours).   Labs: BNP (last 3 results) No results for input(s): BNP in the last 8760 hours. Basic Metabolic Panel: Recent Labs  Lab 10/23/24 1954 10/23/24 1957 10/26/24 0455 10/27/24 0355  NA 139 137 137 137  K 4.0 4.2 3.8 3.8  CL 105 103 107 105  CO2  --  23 19* 21*  GLUCOSE 147* 147* 128* 136*  BUN 22* 21* 19 18  CREATININE 1.50* 1.45*  0.94 1.01  CALCIUM   --  10.0 9.1 9.3   Liver Function Tests: Recent Labs  Lab 10/23/24 1957  AST 19  ALT 15  ALKPHOS 70  BILITOT 0.5  PROT 7.9  ALBUMIN  4.5   No results for input(s): LIPASE, AMYLASE in the last 168 hours. No results for input(s): AMMONIA in the last 168 hours. CBC: Recent Labs  Lab 10/23/24 1954 10/23/24 1957 10/26/24 0455 10/27/24 0355  WBC  --  10.1 9.7 8.9  NEUTROABS  --  5.2  --   --   HGB 16.3 16.4 16.0 15.3  HCT 48.0 47.7 45.0 44.3  MCV  --  92.1 90.2 90.8  PLT  --  225 210 212   Cardiac Enzymes: No results for input(s): CKTOTAL, CKMB, CKMBINDEX, TROPONINI in the last 168 hours. BNP: Invalid input(s): POCBNP CBG: Recent Labs  Lab 10/26/24 1138 10/26/24 1710 10/26/24 2117 10/27/24 0730 10/27/24 1132  GLUCAP 146* 207* 100* 146* 127*   D-Dimer No results for input(s): DDIMER in the last 72 hours. Hgb A1c No results for input(s): HGBA1C in the last 72 hours. Lipid Profile No results for input(s): CHOL, HDL, LDLCALC, TRIG, CHOLHDL, LDLDIRECT in the last 72 hours. Thyroid function studies Recent Labs    10/26/24 0455  TSH 1.920   Anemia work up No results for input(s): VITAMINB12, FOLATE, FERRITIN, TIBC, IRON, RETICCTPCT in the last 72 hours. Urinalysis    Component Value Date/Time   COLORURINE YELLOW 02/15/2015 1356   APPEARANCEUR CLEAR 02/15/2015 1356   LABSPEC 1.020 02/15/2015 1356   PHURINE 6.0 02/15/2015 1356   GLUCOSEU 250 (A) 02/15/2015 1356   HGBUR NEGATIVE 02/15/2015 1356   BILIRUBINUR negative 03/21/2021 1011   KETONESUR trace (5) (A) 03/21/2021 1011   KETONESUR NEGATIVE 02/15/2015 1356   PROTEINUR NEGATIVE 02/15/2015 1356   UROBILINOGEN 0.2 03/21/2021 1011   UROBILINOGEN 2.0 (H) 02/15/2015 1356   NITRITE Negative 03/21/2021 1011   NITRITE NEGATIVE 02/15/2015 1356   LEUKOCYTESUR Negative 03/21/2021 1011   Sepsis Labs Recent Labs  Lab 10/23/24 1957 10/26/24 0455  10/27/24 0355  WBC 10.1 9.7 8.9   Microbiology No results found for this or any previous visit (from the past 240 hours).   Time coordinating discharge: Over 30 minutes  SIGNED:   Brayton Lye, MD  [facility] 10/27/2024, 2:28 PM Pager   If 7PM-7AM, please contact night-coverage [email or web address]      [1] No Known Allergies

## 2024-10-27 NOTE — Progress Notes (Signed)
" °  Heart Failure Stewardship Pharmacist Progress Note   PCP: Delbert Clam, MD PCP-Cardiologist: Dorn Lesches, MD    HPI:  53 yo M with PMH of CHF, CAD s/p CABG, LV thrombus, HTN, T2DM, and HLD.   Presented to the ED on 12/26 with L sided weakness and facial droop. Has been off medications for the last 4-5 months due to traveling to [place]. Underwent CT head which showed concern for acute frontal lobe infarct. Brain MRI confirmed acute infarct in the right frontal lobe with smaller infarcts in the more posterior right frontal and parietal lobes. ECHO 12/27 with LVEF 20-25% (was 25-30% in 02/2021), RWMA, G1DD, RV low normal. Source of acute CVA most likely cryptogenic in the setting of low EF.   Met with patient and his wife at bedside. [place] [phone] utilized. Denies chest pain, shortness of breath, lightheadedness or dizziness. No LE edema. Confirms he does not have current insurance and has been off his medications for the last several months. Denies any issues with taking medications prior. Is agreeable to take medications moving forward. Reviewed GDMT and goals of therapy. Agreeable to [facility] pharmacy on discharge.   Current HF Medications: ACE/ARB/ARNI: Entresto  24/26 mg BID  Prior to admission HF Medications: None  Pertinent Lab Values: Serum creatinine 1.01, BUN 18, Potassium 3.8, Sodium 137, A1c 8.6   Vital Signs: Weight: 164 lbs Blood pressure: 110/60s  Heart rate: 40-50s  I/O: incomplete  Medication Assistance / Insurance Benefits Check: Does the patient have prescription insurance?  No - states he is going to try to reactivate his Medicaid  Outpatient Pharmacy:  Prior to admission outpatient pharmacy: [facility] Is the patient willing to use [facility] TOC pharmacy at discharge? Yes Is the patient willing to transition their outpatient pharmacy to utilize a [facility] outpatient pharmacy?   Yes    Assessment: 1. Chronic systolic CHF (LVEF  20-25%), due to ICM. NYHA class I symptoms. - Not volume overloaded on exam - No BB with chronic sinus bradycardia - On Entresto  24/26 mg BID - uninsured - generic Entresto  is about $30-50 monthly - Holding spironolactone  25 mg daily with low BP overnight - Consider adding Jardiance  10 mg daily prior to discharge    Plan: 1) Medication changes recommended at this time: - Add Jardiance  10 mg daily - minimal to no effects on BP  2) Patient assistance: - No insurance - will send a message to [facility] to connect with financial counselors  3)  Education  - Patient has been educated on current HF medications and potential additions to HF medication regimen - Patient verbalizes understanding that over the next few months, these medication doses may change and more medications may be added to optimize HF regimen - Patient has been educated on basic disease state pathophysiology and goals of therapy   Duwaine Plant, PharmD, BCPS Heart Failure Stewardship Pharmacist Phone [phone]   "

## 2024-10-27 NOTE — Progress Notes (Addendum)
 Patient lying on side at the time this BP was taken. This RN rechecked at 0013 with patient laying with back against the bed. See floow sheet for new BP results.

## 2024-10-27 NOTE — Progress Notes (Signed)
 [id number] Telemetry called this RN and stated that patient had a rhythm change to Junctional. This RN reviewed telemetry monitor and confirmed that patient did appear to have a Junctional rhythm. Patient resting in bed with no complaints. Dr. Charlton notified and order received to obtain EKG. EKC results showed that patient's rhythm was Sinus [place]ola with widening QRS. Dr. Dyana informed of results from EKG. Patient asymptomatic. MD stated to continue to monitor. Patient currently resting in bed. Patient's wife at [facility].

## 2024-10-27 NOTE — Progress Notes (Signed)
 Spanish Interpreter:  Daiva  [phone]  PIV removed.  Tele from monitor removed.  CCMD called by Dorthy RN.    Primary RN will complete Discharge after vaccine administration.

## 2024-10-27 NOTE — TOC Transition Note (Signed)
 Transition of Care [facility]) - Discharge Note   Patient Details  Name: Isaac Ray MRN: [id number] Date of Birth: May 01, 1971  Transition of Care [facility]) CM/SW Contact:  Andrez JULIANNA George, RN Phone Number: 10/27/2024, 3:02 PM   Clinical Narrative:    MATCH MEDICATION ASSISTANCE CARD Pharmacies please call: [phone] for claim processing assistance.  Rx BIN: [id number] Rx Group: [id number] Rx PCN: [id number] Relationship Code: 1 Person Code: 01  Patient ID (MRN): [id number]    Patient Name: Isaac Ray    Patient DOB:02/23/1971    Discharge Date:10/27/2024    Expiration Date:11/03/2024 (must be filled within 7 days of discharge)   Pt is discharging home with outpatient therapy referral sent to [facility]. Information on the AVS.  CM has provided MATCH to assist with the cost of his medications.  Pt has transportation home.    Final next level of care: OP Rehab Barriers to Discharge: Barriers Unresolved (comment), Inadequate or no insurance   Patient Goals and CMS Choice     Choice offered to / list presented to : Patient [place] ownership interest in [facility].provided to:: Patient    Discharge Placement                       Discharge Plan and Services Additional resources added to the After Visit Summary for                                       Social Drivers of Health (SDOH) Interventions SDOH Screenings   Food Insecurity: No Food Insecurity (10/24/2024)  Housing: Unknown (10/26/2024)  Recent Concern: Housing - High Risk (10/24/2024)  Transportation Needs: No Transportation Needs (10/24/2024)  Utilities: Not At Risk (10/24/2024)  Alcohol Screen: Low Risk (10/26/2024)  Financial Resource Strain: High Risk (10/26/2024)  Tobacco Use: Medium Risk (10/23/2024)     Readmission Risk Interventions     No data to display

## 2024-10-30 ENCOUNTER — Telehealth: Payer: Self-pay

## 2024-10-30 DIAGNOSIS — I63411 Cerebral infarction due to embolism of right middle cerebral artery: Secondary | ICD-10-CM

## 2024-10-30 NOTE — Telephone Encounter (Signed)
 PAP: Patient assistance application for Jardiance  through [employer]) has been mailed to pt's home address on file.

## 2024-10-30 NOTE — Transitions of Care (Post Inpatient/ED Visit) (Signed)
 Stroke Discharge Follow-up   10/30/2024 Name:  Isaac Ray MRN:  [id number] DOB:  09/30/1971  Subjective: Isaac Ray is a 54 y.o. year old male who is a primary care patient of No primary care provider on file. An Emmi alert was received indicating patient responded to questions: Problems setting up rehab?. I reached out by phone to follow up on the alert and spoke to Patient/interpreter  ID # [phone]  Care Management Interventions: Used interpreter ROM  ID # [id number].    Reviewed neuro rehab set up for 11/10/2024 ( for PT and OT)   Patient voiced understanding of his follow up appointments.    Confirmed patient is taking his medications as prescribed.  Confirmed all medications and doses.  Reviewed signs of stroke and encouraged patient to call 911 for any stroke symptoms.   Patient currently denies any difficulty with swallowing, eating, or ambulating.   Follow up plan: No further intervention required.   Alan Ee, RN, BSN, CEN [employer]- Transition of Care Team.  [facility] [phone]

## 2024-11-02 ENCOUNTER — Other Ambulatory Visit: Payer: Self-pay

## 2024-11-02 ENCOUNTER — Other Ambulatory Visit (HOSPITAL_COMMUNITY): Payer: Self-pay

## 2024-11-02 ENCOUNTER — Encounter (HOSPITAL_COMMUNITY): Payer: Self-pay

## 2024-11-02 ENCOUNTER — Ambulatory Visit (HOSPITAL_COMMUNITY)
Admit: 2024-11-02 | Discharge: 2024-11-02 | Disposition: A | Discharge status: Home / Self Care | Payer: Self-pay | Source: Ambulatory Visit | Attending: Cardiology | Admitting: Cardiology

## 2024-11-02 VITALS — BP 108/52 | HR 49 | Wt 150.6 lb

## 2024-11-02 DIAGNOSIS — Z79899 Other long term (current) drug therapy: Secondary | ICD-10-CM | POA: Insufficient documentation

## 2024-11-02 DIAGNOSIS — E785 Hyperlipidemia, unspecified: Secondary | ICD-10-CM | POA: Insufficient documentation

## 2024-11-02 DIAGNOSIS — I447 Left bundle-branch block, unspecified: Secondary | ICD-10-CM | POA: Insufficient documentation

## 2024-11-02 DIAGNOSIS — I5022 Chronic systolic (congestive) heart failure: Secondary | ICD-10-CM | POA: Insufficient documentation

## 2024-11-02 DIAGNOSIS — Z951 Presence of aortocoronary bypass graft: Secondary | ICD-10-CM | POA: Insufficient documentation

## 2024-11-02 DIAGNOSIS — Z86718 Personal history of other venous thrombosis and embolism: Secondary | ICD-10-CM | POA: Insufficient documentation

## 2024-11-02 DIAGNOSIS — E119 Type 2 diabetes mellitus without complications: Secondary | ICD-10-CM | POA: Insufficient documentation

## 2024-11-02 DIAGNOSIS — Z7901 Long term (current) use of anticoagulants: Secondary | ICD-10-CM | POA: Insufficient documentation

## 2024-11-02 DIAGNOSIS — I2510 Atherosclerotic heart disease of native coronary artery without angina pectoris: Secondary | ICD-10-CM | POA: Insufficient documentation

## 2024-11-02 DIAGNOSIS — R001 Bradycardia, unspecified: Secondary | ICD-10-CM | POA: Insufficient documentation

## 2024-11-02 DIAGNOSIS — Z8673 Personal history of transient ischemic attack (TIA), and cerebral infarction without residual deficits: Secondary | ICD-10-CM | POA: Insufficient documentation

## 2024-11-02 DIAGNOSIS — I513 Intracardiac thrombosis, not elsewhere classified: Secondary | ICD-10-CM

## 2024-11-02 DIAGNOSIS — Z7984 Long term (current) use of oral hypoglycemic drugs: Secondary | ICD-10-CM | POA: Insufficient documentation

## 2024-11-02 DIAGNOSIS — I459 Conduction disorder, unspecified: Secondary | ICD-10-CM | POA: Insufficient documentation

## 2024-11-02 DIAGNOSIS — Z5971 Insufficient health insurance coverage: Secondary | ICD-10-CM | POA: Insufficient documentation

## 2024-11-02 DIAGNOSIS — I5020 Unspecified systolic (congestive) heart failure: Secondary | ICD-10-CM

## 2024-11-02 DIAGNOSIS — I110 Hypertensive heart disease with heart failure: Secondary | ICD-10-CM | POA: Insufficient documentation

## 2024-11-02 LAB — BASIC METABOLIC PANEL WITH GFR
Anion gap: 10 (ref 5–15)
BUN: 14 mg/dL (ref 6–20)
CO2: 23 mmol/L (ref 22–32)
Calcium: 9.3 mg/dL (ref 8.9–10.3)
Chloride: 105 mmol/L (ref 98–111)
Creatinine, Ser: 1.01 mg/dL (ref 0.61–1.24)
GFR, Estimated: >60 mL/min
Glucose, Bld: 87 mg/dL (ref 70–99)
Potassium: 4.3 mmol/L (ref 3.5–5.1)
Sodium: 138 mmol/L (ref 135–145)

## 2024-11-02 LAB — PRO BRAIN NATRIURETIC PEPTIDE: Pro Brain Natriuretic Peptide: 413 pg/mL — ABNORMAL HIGH

## 2024-11-02 MED ORDER — SPIRONOLACTONE 25 MG PO TABS
25.0000 mg | ORAL_TABLET | Freq: Every day | ORAL | 3 refills | Status: AC
  Filled 2024-11-02: qty 30, 30d supply, fill #0
  Filled 2024-11-27: qty 30, 30d supply, fill #1

## 2024-11-02 NOTE — Progress Notes (Signed)
 "    HEART & VASCULAR TRANSITION OF CARE CONSULT NOTE   Referring Physician: Dr. Sherlon PCP: No primary care provider on file.  Cardiologist: Dorn Lesches, MD  HPI: Referred to clinic by Dr. Sherlon for heart failure consultation.   Isaac Ray is a 54 y.o. male with history of HFrEF due to ICM, CAD s/p CABG  (LIMA-LAD, SVG-OM1-OM2, L RA-D1-D2), CVA, LV thrombus, PAD s/p arthrecomy and balloon angio to L FA in 2020 d/t critical limb ischemia requiring amp of 4th toe 03/2019, HTN, T2DM, and HLD.  He presented to the ED 10/23/24 with L sided facial droop and weakness. He had been off of his medications for the last 4-5 months. CT head was concerning for acute frontal infarct, confirmed by brain MRI. Echo performed showing EF 20-25%, G2DD, and low/nl RV.   Today he presents for transition of care visit with daughter and interpretor. Overall feeling good. NYHA II. Denies chest pain, dyspnea, orthopnea, palpitations, and dizziness. Able to perform ADLs. Appetite okay. Weight at home stable. Compliant with all medications, taking eliquis  as prescribed. Denies ETOH, tobacco, or drug use.   Past Medical History:  Diagnosis Date   CAD (coronary artery disease)    Essential hypertension    Heart murmur    when I was a teenager- no mention of it now   Hyperlipidemia    LV (left ventricular) mural thrombus without MI (HCC) 04/27/2019   Type 2 diabetes mellitus (HCC)    Type II    Current Outpatient Medications  Medication Sig Dispense Refill   apixaban  (ELIQUIS ) 5 MG TABS tablet Take 1 tablet (5 mg total) by mouth 2 (two) times daily. 60 tablet 0   atorvastatin  (LIPITOR ) 80 MG tablet Take 1 tablet (80 mg total) by mouth daily. 30 tablet 0   dapagliflozin  propanediol (FARXIGA ) 5 MG TABS tablet Take 1 tablet (5 mg total) by mouth daily before breakfast. 30 tablet 0   glipiZIDE  (GLUCOTROL  XL) 2.5 MG 24 hr tablet Take 1 tablet (2.5 mg total) by mouth daily with breakfast. 30  tablet 0   metFORMIN  (GLUCOPHAGE ) 1000 MG tablet Take 1 tablet (1,000 mg total) by mouth 2 (two) times daily with a meal. 60 tablet 0   sacubitril -valsartan  (ENTRESTO ) 24-26 MG Take 1 tablet by mouth 2 (two) times daily. 60 tablet 0   No current facility-administered medications for this encounter.    Allergies[1]  Social History   Socioeconomic History   Marital status: Married    Spouse name: nancy   Number of children: 3   Years of education: Not on file   Highest education level: Associate degree: academic program  Occupational History   Occupation: mechanical  Tobacco Use   Smoking status: Former    Current packs/day: 0.00    Types: Cigarettes    Start date: 02/15/1985    Quit date: 02/16/2015    Years since quitting: 9.7   Smokeless tobacco: Never  Vaping Use   Vaping status: Never Used  Substance and Sexual Activity   Alcohol use: No    Alcohol/week: 0.0 standard drinks of alcohol   Drug use: No   Sexual activity: Not Currently  Other Topics Concern   Not on file  Social History Narrative   Not on file   Social Drivers of Health   Tobacco Use: Medium Risk (10/23/2024)   Patient History    Smoking Tobacco Use: Former    Smokeless Tobacco Use: Never    Passive Exposure: Not on  file  Financial Resource Strain: High Risk (10/26/2024)   Overall Financial Resource Strain (CARDIA)    Difficulty of Paying Living Expenses: Very hard  Food Insecurity: No Food Insecurity (10/30/2024)   Epic    Worried About Programme Researcher, Broadcasting/film/video in the Last Year: Never true    Ran Out of Food in the Last Year: Never true  Transportation Needs: No Transportation Needs (10/30/2024)   Epic    Lack of Transportation (Medical): No    Lack of Transportation (Non-Medical): No  Physical Activity: Not on file  Stress: Not on file  Social Connections: Not on file  Intimate Partner Violence: Not At Risk (10/30/2024)   Epic    Fear of Current or Ex-Partner: No    Emotionally Abused: No     Physically Abused: No    Sexually Abused: No  Depression (PHQ2-9): Not on file  Alcohol Screen: Low Risk (10/26/2024)   Alcohol Screen    Last Alcohol Screening Score (AUDIT): 0  Housing: Unknown (10/30/2024)   Epic    Unable to Pay for Housing in the Last Year: No    Number of Times Moved in the Last Year: Not on file    Homeless in the Last Year: No  Recent Concern: Housing - High Risk (10/24/2024)   Epic    Unable to Pay for Housing in the Last Year: No    Number of Times Moved in the Last Year: 1    Homeless in the Last Year: Yes  Utilities: Not At Risk (10/30/2024)   Epic    Threatened with loss of utilities: No  Health Literacy: Not on file    Family History  Problem Relation Age of Onset   Coronary artery disease Father        had CABG   Heart disease Father     Vitals:   11/02/24 1504  BP: (!) 108/52  Pulse: (!) 49  SpO2: 99%  Weight: 68.3 kg (150 lb 9.6 oz)    Filed Weights   11/02/24 1504  Weight: 68.3 kg (150 lb 9.6 oz)    PHYSICAL EXAM: General: Older-than-age appearing. No distress  Cardiac: JVP flat. No murmurs  Extremities: Warm and dry.  No edema.  Neuro: A&O x3. Affect pleasant.   ECG (personally reviewed with Dr. Zenaida): SB, short PR and LBBB (QRS 170 ms, longer than prior)  ASSESSMENT & PLAN:  Chronic HFrEF, ICM - Echo 7/16: EF 30-35%, G1DD - Echo 12/25: EF 20-25%, G2DD, low/nl RV - NYHA II. Appears euvolemic on exam - no need for diuretics at this time. BMET/BNP - GDMT: ? blocker: none with bradycardia ARB/ARNI: continue entresto  24-26 mg bid MRA: start spiro 25 mg daily, repeat BMET in 14 days SGLT2i: continue farxiga  5 mg daily, consider increasing if continues without syncope - would benefit from CRT-D, ICD with persistently low EF; has medicaid pending, expecting next month per daughter. Refer to EP.  - could potentially be an advanced therapies candidate if decompensates and compliance improves  LV thormbus - substrate of LV  thrombus seen on echo - continue eliquis  5 mg bid   Recent CVA - d/t LV thrombus - on eliquis   CAD - s/p CABG x5 (LIMA-LAD, SVG-OM1-OM2, L RA-D1-D2)  in 01/2015 - no repeat cath in records - no chest pain - LDL 102 - on atorva 80 mg daily + eliquis  (no asa)  LBBB, Bradycardia - no nodal blockers - prior start to LBBB on last ECG, now with worsening conduction  delay - ECG with QRS 170 ms - refer to EP for CRT-D  Referred to HFSW (PCP, Medications, Transportation, ETOH Abuse, Drug Abuse, Insurance, Financial ): No Refer to Pharmacy: No Refer to Home Health: No Refer to Advanced Heart Failure Clinic: Yes  Refer to General Cardiology: No, established  Follow up in 1 month with APP (medication titration, schedule echo). Currently uninsured, should have medications til the end of the month, when Medicaid is expected to be accepted.   Jaylaa Gallion, NP 11/02/2024     [1] No Known Allergies  "

## 2024-11-02 NOTE — Patient Instructions (Addendum)
 Medication Changes:  START SPIRONOLACTONE  25MG  ONCE DAILY   PLEASE CALL US  IF YOU ARE HAVING ISSUES GETTING OR RUNNING OUT OF YOUR MEDICATIONS- OR IF INSURANCE IS TAKING LONGER SO WE CAN ENSURE YOU HAVE YOUR MEDICATIONS   Lab Work:  Labs done today, your results will be available in MyChart, we will contact you for abnormal readings.  AND THEN LABS AGAIN IN 2 WEEKS AS SCHEDULED   Referrals:  YOU HAVE BEEN REFERRED TO ELECTROPHYSIOLOGY THEY WILL REACH OUT TO YOU OR CALL TO ARRANGE THIS. PLEASE CALL US  WITH ANY CONCERNS   Follow-Up in: 1 MONTH AS SCHEDULED WITH APP CLINIC  At the [facility], you and your health needs are our priority. We have a designated team specialized in the treatment of Heart Failure. This Care Team includes your primary Heart Failure Specialized Cardiologist (physician), Advanced Practice Providers (APPs- Physician Assistants and Nurse Practitioners), and Pharmacist who all work together to provide you with the care you need, when you need it.   You may see any of the following providers on your designated Care Team at your next follow up:  Dr. Toribio Fuel Dr. Ezra Shuck Dr. Odis Brownie Greig Mosses, NP Caffie Shed, [place] [facility] [place], [place] Beckey Coe, NP Jordan Lee, NP Tinnie Redman, PharmD   Please be sure to bring in all your medications bottles to every appointment.   Need to Contact Us :  If you have any questions or concerns before your next appointment please send us  a message through [place] or call our office at [phone].    TO LEAVE A MESSAGE FOR THE NURSE SELECT OPTION 2, PLEASE LEAVE A MESSAGE INCLUDING: YOUR NAME DATE OF BIRTH CALL BACK NUMBER REASON FOR CALL**this is important as we prioritize the call backs  YOU WILL RECEIVE A CALL BACK THE SAME DAY AS LONG AS YOU CALL BEFORE 4:00 PM

## 2024-11-03 ENCOUNTER — Ambulatory Visit (HOSPITAL_COMMUNITY): Payer: Self-pay | Admitting: Cardiology

## 2024-11-06 ENCOUNTER — Ambulatory Visit: Payer: Self-pay | Admitting: Nurse Practitioner

## 2024-11-06 ENCOUNTER — Other Ambulatory Visit: Payer: Self-pay

## 2024-11-06 ENCOUNTER — Encounter: Payer: Self-pay | Admitting: Nurse Practitioner

## 2024-11-06 VITALS — BP 89/48 | HR 57 | Wt 145.0 lb

## 2024-11-06 DIAGNOSIS — E119 Type 2 diabetes mellitus without complications: Secondary | ICD-10-CM

## 2024-11-06 DIAGNOSIS — E785 Hyperlipidemia, unspecified: Secondary | ICD-10-CM | POA: Insufficient documentation

## 2024-11-06 DIAGNOSIS — I5020 Unspecified systolic (congestive) heart failure: Secondary | ICD-10-CM

## 2024-11-06 DIAGNOSIS — E782 Mixed hyperlipidemia: Secondary | ICD-10-CM

## 2024-11-06 DIAGNOSIS — Z951 Presence of aortocoronary bypass graft: Secondary | ICD-10-CM

## 2024-11-06 DIAGNOSIS — Z8673 Personal history of transient ischemic attack (TIA), and cerebral infarction without residual deficits: Secondary | ICD-10-CM | POA: Insufficient documentation

## 2024-11-06 DIAGNOSIS — E1159 Type 2 diabetes mellitus with other circulatory complications: Secondary | ICD-10-CM

## 2024-11-06 DIAGNOSIS — I10 Essential (primary) hypertension: Secondary | ICD-10-CM

## 2024-11-06 DIAGNOSIS — I513 Intracardiac thrombosis, not elsewhere classified: Secondary | ICD-10-CM

## 2024-11-06 MED ORDER — DAPAGLIFLOZIN PROPANEDIOL 5 MG PO TABS
5.0000 mg | ORAL_TABLET | Freq: Every day | ORAL | 1 refills | Status: DC
  Filled 2024-11-06: qty 60, 60d supply, fill #0

## 2024-11-06 MED ORDER — LANCETS MISC
1.0000 | 0 refills | Status: AC
  Filled 2024-11-06: qty 100, 25d supply, fill #0

## 2024-11-06 MED ORDER — LANCET DEVICE MISC
1.0000 | Freq: Three times a day (TID) | 0 refills | Status: AC
  Filled 2024-11-06: qty 1, 30d supply, fill #0

## 2024-11-06 MED ORDER — ATORVASTATIN CALCIUM 80 MG PO TABS
80.0000 mg | ORAL_TABLET | Freq: Every day | ORAL | 1 refills | Status: DC
  Filled 2024-11-06: qty 90, 90d supply, fill #0

## 2024-11-06 MED ORDER — METFORMIN HCL 1000 MG PO TABS
1000.0000 mg | ORAL_TABLET | Freq: Two times a day (BID) | ORAL | 1 refills | Status: AC
  Filled 2024-11-06: qty 180, 90d supply, fill #0
  Filled 2024-11-27: qty 60, 30d supply, fill #0

## 2024-11-06 MED ORDER — BLOOD GLUCOSE MONITOR SYSTEM W/DEVICE KIT
1.0000 | PACK | Freq: Three times a day (TID) | 0 refills | Status: AC
  Filled 2024-11-06: qty 1, 30d supply, fill #0

## 2024-11-06 MED ORDER — APIXABAN 5 MG PO TABS
5.0000 mg | ORAL_TABLET | Freq: Two times a day (BID) | ORAL | 3 refills | Status: DC
  Filled 2024-11-06: qty 60, 30d supply, fill #0

## 2024-11-06 MED ORDER — GLIPIZIDE ER 2.5 MG PO TB24
2.5000 mg | ORAL_TABLET | Freq: Every day | ORAL | 1 refills | Status: AC
  Filled 2024-11-06: qty 90, 90d supply, fill #0
  Filled 2024-11-27: qty 30, 30d supply, fill #0

## 2024-11-06 MED ORDER — BLOOD GLUCOSE TEST VI STRP
1.0000 | ORAL_STRIP | Freq: Three times a day (TID) | 0 refills | Status: AC
  Filled 2024-11-06: qty 100, 34d supply, fill #0

## 2024-11-06 NOTE — Patient Instructions (Signed)

## 2024-11-06 NOTE — Assessment & Plan Note (Signed)
 Coronary artery disease, status post bypass graft Coronary artery disease with previous bypass surgery. - Continue current medications. - Advised dietary modifications: avoid fatty and fried foods. - Encouraged heart-healthy, low-salt, low-fat diet. - Encouraged moderate exercise 30 minutes, 5 days a week. Continue atorvastatin  80 mg daily, Entresto  5 mg twice daily

## 2024-11-06 NOTE — Progress Notes (Signed)
 "  New Patient Office Visit  Subjective:  Patient ID: Isaac Ray, male    DOB: [birth year]  Age: 54 y.o. MRN: [id number]  CC:  Chief Complaint  Patient presents with   Hospitalization Follow-up   Establish Care    HPI    Discussed the use of AI scribe software for clinical note transcription with the patient, who gave verbal consent to proceed.  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  Patient presents to establish care for his chronic medical conditions  Interpretation services provided by medical interpreter, he is accompanied by his daughter   He was recently hospitalized from October 23, 2024, to October 27, 2024, for a stroke. Since discharge, he has experienced no weakness or speech difficulties and maintains full movement in his arms and legs.  He has a history of diabetes with a recent A1c of 8.6. He takes metformin  1000 mg twice daily and glipizide  2.5 mg. He was recently started on Farxiga  to help manage his blood sugar. He checks his blood sugar at home but has run out of test strips.  He has a history of CAD.  Including a bypass surgery. He is on atorvastatin  80 mg for cholesterol management. He also takes Eliquis  5 mg twice daily past history of left ventricular thrombus, for heart failure he is on, Entresto  25/26 mg twice daily, and spironolactone  25 mg daily. His blood pressure was noted to be low, but he reports no dizziness or shortness of breath.  He lives with his daughter and does not smoke, drink, or vape. He has three children and is married.     Assessment & Plan     Past Medical History:  Diagnosis Date   CAD (coronary artery disease)     CVA (cerebral vascular accident) (HCC) 10/23/2024   Essential hypertension    Heart murmur    when I was a teenager- no mention of it now   Hyperlipidemia    LV (left ventricular) mural thrombus without MI (HCC) 04/27/2019   Type 2 diabetes mellitus (HCC)    Type II    Past Surgical History:  Procedure Laterality Date   AMPUTATION Left 04/08/2019   Procedure: LEFT FOOT 4TH TOE AMPUTATION;  Surgeon: Harden Jerona GAILS, MD;  Location: [facility] OR;  Service: Orthopedics;  Laterality: Left;   CORONARY ANGIOPLASTY WITH STENT PLACEMENT  2013   CORONARY ARTERY BYPASS GRAFT N/A 02/16/2015   Procedure: CORONARY ARTERY BYPASS GRAFTING (CABG) x5 using left internal mammary artery, right thigh greater saphenous vein, and left radial artery. ;  Surgeon: Elspeth JAYSON Millers, MD;  Location: [facility] OR;  Service: Open Heart Surgery;  Laterality: N/A;   LAPAROSCOPIC APPENDECTOMY N/A 04/25/2019   Procedure: APPENDECTOMY LAPAROSCOPIC;  Surgeon: Kinsinger, Herlene Righter, MD;  Location: [facility] OR;  Service: General;  Laterality: N/A;   LEFT HEART CATHETERIZATION WITH CORONARY ANGIOGRAM N/A 02/14/2015   Procedure: LEFT HEART CATHETERIZATION WITH CORONARY ANGIOGRAM;  Surgeon: Dorn JINNY Lesches, MD;  Location: [facility] CATH LAB;  Service: Cardiovascular;  Laterality: N/A;   LOWER EXTREMITY ANGIOGRAPHY N/A 03/30/2019   Procedure: LOWER EXTREMITY ANGIOGRAPHY;  Surgeon: Lesches Dorn JINNY, MD;  Location: [facility] INVASIVE CV LAB;  Service: Cardiovascular;  Laterality: N/A;   RADIAL ARTERY HARVEST Left 02/16/2015  Procedure: RADIAL ARTERY HARVEST;  Surgeon: Elspeth JAYSON Millers, MD;  Location: [facility] OR;  Service: Open Heart Surgery;  Laterality: Left;   TEE WITHOUT CARDIOVERSION N/A 02/16/2015   Procedure: TRANSESOPHAGEAL ECHOCARDIOGRAM (TEE);  Surgeon: Elspeth JAYSON Millers, MD;  Location: [facility] OR;  Service: Open Heart Surgery;  Laterality: N/A;    Family History  Problem Relation Age of Onset   Coronary artery disease Father        had CABG   Heart disease  Father     Social History   Socioeconomic History   Marital status: Married    Spouse name: nancy   Number of children: 3   Years of education: Not on file   Highest education level: Associate degree: academic program  Occupational History   Occupation: mechanical  Tobacco Use   Smoking status: Former    Current packs/day: 0.00    Types: Cigarettes    Start date: 02/15/1985    Quit date: 02/16/2015    Years since quitting: 9.7   Smokeless tobacco: Never  Vaping Use   Vaping status: Never Used  Substance and Sexual Activity   Alcohol use: No    Alcohol/week: 0.0 standard drinks of alcohol   Drug use: No   Sexual activity: Not Currently  Other Topics Concern   Not on file  Social History Narrative   Lives with his daughter    Social Drivers of Health   Tobacco Use: Medium Risk (11/06/2024)   Patient History    Smoking Tobacco Use: Former    Smokeless Tobacco Use: Never    Passive Exposure: Not on Actuary Strain: High Risk (10/26/2024)   Overall Financial Resource Strain (CARDIA)    Difficulty of Paying Living Expenses: Very hard  Food Insecurity: No Food Insecurity (10/30/2024)   Epic    Worried About Radiation Protection Practitioner of Food in the Last Year: Never true    Ran Out of Food in the Last Year: Never true  Transportation Needs: No Transportation Needs (10/30/2024)   Epic    Lack of Transportation (Medical): No    Lack of Transportation (Non-Medical): No  Physical Activity: Not on file  Stress: Not on file  Social Connections: Not on file  Intimate Partner Violence: Not At Risk (10/30/2024)   Epic    Fear of Current or Ex-Partner: No    Emotionally Abused: No    Physically Abused: No    Sexually Abused: No  Depression (PHQ2-9): Low Risk (11/06/2024)   Depression (PHQ2-9)    PHQ-2 Score: 0  Alcohol Screen: Low Risk (10/26/2024)   Alcohol Screen    Last Alcohol Screening Score (AUDIT): 0  Housing: Low Risk (11/06/2024)   Epic    Unable to Pay for Housing in  the Last Year: No    Number of Times Moved in the Last Year: 0    Homeless in the Last Year: No  Recent Concern: Housing - High Risk (10/24/2024)   Epic    Unable to Pay for Housing in the Last Year: No    Number of Times Moved in the Last Year: 1    Homeless in the Last Year: Yes  Utilities: Not At Risk (10/30/2024)   Epic    Threatened with loss of utilities: No  Health Literacy: Not on file    ROS Review of Systems  Constitutional:  Negative for appetite change, chills, fatigue and fever.  HENT:  Negative for congestion, postnasal drip, rhinorrhea and sneezing.   Respiratory:  Negative for cough, shortness of breath and wheezing.   Cardiovascular:  Negative for chest pain, palpitations and leg swelling.  Gastrointestinal:  Negative for abdominal pain, constipation, nausea and vomiting.  Genitourinary:  Negative for difficulty urinating, dysuria, flank pain and frequency.  Musculoskeletal:  Negative for arthralgias, back pain, joint swelling and myalgias.  Skin:  Negative for color change, pallor, rash and wound.  Neurological:  Negative for dizziness, facial asymmetry, weakness, numbness and headaches.  Psychiatric/Behavioral:  Negative for behavioral problems, confusion, self-injury and suicidal ideas.     Objective:   Today's Vitals: BP (!) 89/48   Pulse (!) 57   Wt 145 lb (65.8 kg)   SpO2 99%   BMI 22.71 kg/m   Physical Exam Vitals and nursing note reviewed.  Constitutional:      General: He is not in acute distress.    Appearance: Normal appearance. He is not ill-appearing, toxic-appearing or diaphoretic.  Eyes:     General: No scleral icterus.       Right eye: No discharge.        Left eye: No discharge.     Extraocular Movements: Extraocular movements intact.     Conjunctiva/sclera: Conjunctivae normal.  Cardiovascular:     Rate and Rhythm: Normal rate and regular rhythm.     Pulses: Normal pulses.     Heart sounds: Normal heart sounds. No murmur heard.     No friction rub. No gallop.  Pulmonary:     Effort: Pulmonary effort is normal. No respiratory distress.     Breath sounds: Normal breath sounds. No stridor. No wheezing, rhonchi or rales.  Chest:     Chest wall: No tenderness.  Abdominal:     General: There is no distension.     Palpations: Abdomen is soft.     Tenderness: There is no abdominal tenderness. There is no right CVA tenderness, left CVA tenderness or guarding.  Musculoskeletal:        General: No swelling, tenderness, deformity or signs of injury.     Right lower leg: No edema.     Left lower leg: No edema.  Skin:    General: Skin is warm and dry.     Capillary Refill: Capillary refill takes less than 2 seconds.     Coloration: Skin is not jaundiced or pale.     Findings: No bruising, erythema or lesion.  Neurological:     Mental Status: He is alert and oriented to person, place, and time.     Motor: No weakness.     Gait: Gait normal.  Psychiatric:        Mood and Affect: Mood normal.        Behavior: Behavior normal.        Thought Content: Thought content normal.        Judgment: Judgment normal.     Assessment & Plan:   Problem List Items Addressed This Visit       Cardiovascular and Mediastinum   Essential hypertension   Relevant Medications   atorvastatin  (LIPITOR ) 80 MG tablet   apixaban  (ELIQUIS ) 5 MG TABS tablet   Other Relevant Orders   AMB Referral VBCI Care Management   LV (left ventricular) mural thrombus   Relevant Medications   atorvastatin  (LIPITOR ) 80 MG tablet   apixaban  (ELIQUIS ) 5 MG TABS tablet   Other Relevant Orders   AMB Referral VBCI Care Management   HFrEF (heart failure with reduced ejection fraction) (HCC)    Managed with Entresto   24-26mg  Bid  and spironolactone  25mg  daily. Blood pressure low but stable. - Continue current medications. - Monitor for symptoms of low blood pressure such as dizziness or shortness of breath. - Follow up with cardiology as scheduled.         Relevant Medications   atorvastatin  (LIPITOR ) 80 MG tablet   apixaban  (ELIQUIS ) 5 MG TABS tablet     Endocrine   Non-insulin  dependent type 2 diabetes mellitus (HCC)   Lab Results  Component Value Date   HGBA1C 8.6 (H) 10/24/2024    Poorly controlled with A1c of 8.6%. - Ordered glucose test strips for home monitoring. - Advised dietary modifications: avoid sugar, sweets, soda, bread, rice, pasta, and foods made with flour. - Encouraged regular blood sugar monitoring.  CBG goals discussed - Will refer to an eye doctor for diabetic retinopathy screening once Medicaid is active. Continue glipizide  2.5 mg daily, metformin  1000 mg twice daily, Farxiga  5 mg daily  .      Relevant Medications   Blood Glucose Monitoring Suppl (BLOOD GLUCOSE MONITOR SYSTEM) w/Device KIT   Glucose Blood (BLOOD GLUCOSE TEST STRIPS) STRP   Lancet Device MISC   Lancets MISC   atorvastatin  (LIPITOR ) 80 MG tablet   dapagliflozin  propanediol (FARXIGA ) 5 MG TABS tablet   glipiZIDE  (GLUCOTROL  XL) 2.5 MG 24 hr tablet   metFORMIN  (GLUCOPHAGE ) 1000 MG tablet   Other Relevant Orders   Microalbumin/Creatinine Ratio, Urine   AMB Referral VBCI Care Management   Type 2 diabetes mellitus (HCC)   Relevant Medications   atorvastatin  (LIPITOR ) 80 MG tablet   dapagliflozin  propanediol (FARXIGA ) 5 MG TABS tablet   glipiZIDE  (GLUCOTROL  XL) 2.5 MG 24 hr tablet   metFORMIN  (GLUCOPHAGE ) 1000 MG tablet     Other   S/P CABG x 5   Coronary artery disease, status post bypass graft Coronary artery disease with previous bypass surgery. - Continue current medications. - Advised dietary modifications: avoid fatty and fried foods. - Encouraged heart-healthy, low-salt, low-fat diet. - Encouraged moderate exercise 30 minutes, 5 days a week. Continue atorvastatin  80 mg daily, Entresto  5 mg twice daily       Hyperlipidemia   Relevant Medications   atorvastatin  (LIPITOR ) 80 MG tablet   apixaban  (ELIQUIS ) 5 MG TABS tablet    Dyslipidemia, goal LDL below 55   Lab Results  Component Value Date   CHOL 152 10/24/2024   HDL 27 (L) 10/24/2024   LDLCALC 102 (H) 10/24/2024   TRIG 118 10/24/2024   CHOLHDL 5.7 10/24/2024    Managed with atorvastatin  80 mg daily. Goal LDL <55 mg/dL. - Continue atorvastatin  80 mg daily. - Advised dietary modifications to lower cholesterol. - Will recheck cholesterol levels in 6 weeks with fasting labs       Relevant Medications   atorvastatin  (LIPITOR ) 80 MG tablet   apixaban  (ELIQUIS ) 5 MG TABS tablet   Other Relevant Orders   AMB Referral VBCI Care Management   History of CVA (cerebrovascular accident) - Primary   Recent stroke with no residual deficits. - Continue Eliquis  5 mg twice daily, atorvastatin  80 mg daily Warning signs of stroke reviewed, advised to seek urgent medical attention if they occur Starting rehab      Relevant Medications   apixaban  (ELIQUIS ) 5 MG TABS tablet    Outpatient Encounter Medications as of 11/06/2024  Medication Sig   Blood Glucose Monitoring Suppl (BLOOD GLUCOSE MONITOR SYSTEM) w/Device KIT Testing in the morning, at noon, and at bedtime.   Glucose Blood (BLOOD  GLUCOSE TEST STRIPS) STRP Use to test in the morning, at noon, and at bedtime.    Lancet Device MISC Use to check blood sugar   Lancets MISC Use up to four times daily as directed. (FOR ICD-10 E10.9, E11.9).   sacubitril -valsartan  (ENTRESTO ) 24-26 MG Take 1 tablet by mouth 2 (two) times daily.   spironolactone  (ALDACTONE ) 25 MG tablet Take 1 tablet (25 mg total) by mouth daily.   [DISCONTINUED] apixaban  (ELIQUIS ) 5 MG TABS tablet Take 1 tablet (5 mg total) by mouth 2 (two) times daily.   [DISCONTINUED] atorvastatin  (LIPITOR ) 80 MG tablet Take 1 tablet (80 mg total) by mouth daily.   [DISCONTINUED] dapagliflozin  propanediol (FARXIGA ) 5 MG TABS tablet Take 1 tablet (5 mg total) by mouth daily before breakfast.   [DISCONTINUED] glipiZIDE  (GLUCOTROL  XL) 2.5 MG 24 hr tablet Take 1 tablet  (2.5 mg total) by mouth daily with breakfast.   [DISCONTINUED] metFORMIN  (GLUCOPHAGE ) 1000 MG tablet Take 1 tablet (1,000 mg total) by mouth 2 (two) times daily with a meal.   apixaban  (ELIQUIS ) 5 MG TABS tablet Take 1 tablet (5 mg total) by mouth 2 (two) times daily.   atorvastatin  (LIPITOR ) 80 MG tablet Take 1 tablet (80 mg total) by mouth daily.   dapagliflozin  propanediol (FARXIGA ) 5 MG TABS tablet Take 1 tablet (5 mg total) by mouth daily before breakfast.   glipiZIDE  (GLUCOTROL  XL) 2.5 MG 24 hr tablet Take 1 tablet (2.5 mg total) by mouth daily with breakfast.   metFORMIN  (GLUCOPHAGE ) 1000 MG tablet Take 1 tablet (1,000 mg total) by mouth 2 (two) times daily with a meal.   No facility-administered encounter medications on file as of 11/06/2024.    Follow-up: Return in about 6 weeks (around 12/18/2024) for HYPERLIPIDEMIA.   Atoya Andrew R Latissa Frick, FNP "

## 2024-11-06 NOTE — Assessment & Plan Note (Addendum)
 Recent stroke with no residual deficits. - Continue Eliquis  5 mg twice daily, atorvastatin  80 mg daily Warning signs of stroke reviewed, advised to seek urgent medical attention if they occur Starting rehab

## 2024-11-06 NOTE — Assessment & Plan Note (Signed)
 Lab Results  Component Value Date   HGBA1C 8.6 (H) 10/24/2024    Poorly controlled with A1c of 8.6%. - Ordered glucose test strips for home monitoring. - Advised dietary modifications: avoid sugar, sweets, soda, bread, rice, pasta, and foods made with flour. - Encouraged regular blood sugar monitoring.  CBG goals discussed - Will refer to an eye doctor for diabetic retinopathy screening once Medicaid is active. Continue glipizide  2.5 mg daily, metformin  1000 mg twice daily, Farxiga  5 mg daily  .

## 2024-11-06 NOTE — Assessment & Plan Note (Addendum)
 Lab Results  Component Value Date   CHOL 152 10/24/2024   HDL 27 (L) 10/24/2024   LDLCALC 102 (H) 10/24/2024   TRIG 118 10/24/2024   CHOLHDL 5.7 10/24/2024    Managed with atorvastatin  80 mg daily. Goal LDL <55 mg/dL. - Continue atorvastatin  80 mg daily. - Advised dietary modifications to lower cholesterol. - Will recheck cholesterol levels in 6 weeks with fasting labs

## 2024-11-06 NOTE — Assessment & Plan Note (Signed)
" °  Managed with Entresto  24-26mg  Bid  and spironolactone  25mg  daily. Blood pressure low but stable. - Continue current medications. - Monitor for symptoms of low blood pressure such as dizziness or shortness of breath. - Follow up with cardiology as scheduled.   "

## 2024-11-09 ENCOUNTER — Other Ambulatory Visit: Payer: Self-pay

## 2024-11-09 ENCOUNTER — Encounter (HOSPITAL_COMMUNITY): Payer: Self-pay

## 2024-11-09 ENCOUNTER — Ambulatory Visit: Payer: Self-pay | Admitting: *Deleted

## 2024-11-09 ENCOUNTER — Emergency Department (HOSPITAL_COMMUNITY)
Admission: EM | Admit: 2024-11-09 | Discharge: 2024-11-09 | Disposition: A | Discharge status: Home / Self Care | Payer: MEDICAID | Attending: Emergency Medicine | Admitting: Emergency Medicine

## 2024-11-09 DIAGNOSIS — Z7901 Long term (current) use of anticoagulants: Secondary | ICD-10-CM | POA: Insufficient documentation

## 2024-11-09 DIAGNOSIS — K029 Dental caries, unspecified: Secondary | ICD-10-CM | POA: Insufficient documentation

## 2024-11-09 MED ORDER — OXYCODONE-ACETAMINOPHEN 5-325 MG PO TABS
1.0000 | ORAL_TABLET | Freq: Once | ORAL | Status: AC
  Administered 2024-11-09: 1 via ORAL
  Filled 2024-11-09: qty 1

## 2024-11-09 MED ORDER — AMOXICILLIN-POT CLAVULANATE 875-125 MG PO TABS
1.0000 | ORAL_TABLET | Freq: Two times a day (BID) | ORAL | 0 refills | Status: DC
  Filled 2024-11-09: qty 14, 7d supply, fill #0

## 2024-11-09 NOTE — Telephone Encounter (Signed)
 Just FYI.

## 2024-11-09 NOTE — Telephone Encounter (Signed)
 FYI Only or Action Required?: FYI only for provider: ED advised.  Patient was last seen in primary care on 11/06/2024 by Paseda, Folashade R, FNP.  Called Nurse Triage reporting Dental Pain.  Symptoms began yesterday.  Interventions attempted: Nothing.  Symptoms are: gradually worsening.  Triage Disposition: See HCP Within 4 Hours (Or PCP Triage)  Patient/caregiver understands and will follow disposition?: Yes   Copied from CRM [id number]. Topic: Clinical - Red Word Triage >> Nov 09, 2024  3:24 PM Joesph NOVAK wrote: Red Word that prompted transfer to Nurse Triage:  Tooth pain - severe pain . Reason for Disposition  [1] SEVERE pain (e.g., excruciating, unable to eat, unable to do any normal activities) AND [2] not improved 2 hours after pain medicine  Answer Assessment - Initial Assessment Questions Patient's daughter is calling to report severe tooth pain- patient is unable to get dental appointment until tomorrow. No open appointment- advised ED- doubt UC will give pain medication that will control severe tooth pain.    1. LOCATION: Which tooth is hurting?  (e.g., right-side/left-side, upper/lower, front/back)     4 th tooth-front- bottom 2. ONSET: When did the toothache start?  (e.g., hours, days)      Started yesterday evening 3. SEVERITY: How bad is the toothache?  (Scale 1-10; mild, moderate or severe)     Severe today- unable to eat 4. SWELLING: Is there any visible swelling of your face?     no 5. OTHER SYMPTOMS: Do you have any other symptoms? (e.g., fever)     no  Protocols used: Toothache-A-AH

## 2024-11-09 NOTE — ED Triage Notes (Signed)
 PT c/o dental pain.  HR low.  States his HR always low and denies any symptoms.  Only here from dental pain of front lower teeth.

## 2024-11-09 NOTE — ED Triage Notes (Signed)
 Pt came in via POV d/t front/lower dental pain the past 2 days. Denies any recent oral trauma, has not seen a dentist in many years, A/Ox4, rates his pain 10/10 during triage.

## 2024-11-09 NOTE — ED Provider Notes (Signed)
 " [place] EMERGENCY DEPARTMENT AT [facility] Provider Note   CSN: [id number] Arrival date & time: 11/09/24  1617     Patient presents with: Dental Pain   Isaac Ray is a 54 y.o. male presents emerged from today for evaluation of lower dental pain for the past few days.  Reports his front teeth.  Reports it started hurting 2 days ago.  Requesting pain medication.  Denies any trouble swallowing or trouble breathing, denies any facial swelling or any fever.  Reports he did not see a dentist for over 5 years.  Denies any trauma to the mouth.  Spanish interpreter used during this encounter.  Spanish interpreter used   Dental Pain Associated symptoms: no congestion, no facial swelling, no fever and no neck pain        Prior to Admission medications  Medication Sig Start Date End Date Taking? Authorizing Provider  apixaban  (ELIQUIS ) 5 MG TABS tablet Take 1 tablet (5 mg total) by mouth 2 (two) times daily. 11/06/24   Paseda, Folashade R, FNP  atorvastatin  (LIPITOR ) 80 MG tablet Take 1 tablet (80 mg total) by mouth daily. 11/06/24 11/06/25  Paseda, Folashade R, FNP  Blood Glucose Monitoring Suppl (BLOOD GLUCOSE MONITOR SYSTEM) w/Device KIT Testing in the morning, at noon, and at bedtime. 11/06/24   Paseda, Folashade R, FNP  dapagliflozin  propanediol (FARXIGA ) 5 MG TABS tablet Take 1 tablet (5 mg total) by mouth daily before breakfast. 11/06/24   Paseda, Folashade R, FNP  glipiZIDE  (GLUCOTROL  XL) 2.5 MG 24 hr tablet Take 1 tablet (2.5 mg total) by mouth daily with breakfast. 11/06/24   Paseda, Folashade R, FNP  Glucose Blood (BLOOD GLUCOSE TEST STRIPS) STRP Use to test in the morning, at noon, and at bedtime.  11/06/24 12/10/24  Paseda, Folashade R, FNP  Lancet Device MISC Use to check blood sugar 11/06/24 12/06/24  Paseda, Folashade R, FNP  Lancets MISC Use up to four times daily as directed. (FOR ICD-10 E10.9, E11.9). 11/06/24   Paseda, Folashade R, FNP  metFORMIN  (GLUCOPHAGE ) 1000 MG  tablet Take 1 tablet (1,000 mg total) by mouth 2 (two) times daily with a meal. 11/06/24   Paseda, Folashade R, FNP  sacubitril -valsartan  (ENTRESTO ) 24-26 MG Take 1 tablet by mouth 2 (two) times daily. 10/27/24   Elgergawy, Brayton RAMAN, MD  spironolactone  (ALDACTONE ) 25 MG tablet Take 1 tablet (25 mg total) by mouth daily. 11/02/24   Lee, Jordan, NP    Allergies: Patient has no known allergies.    Review of Systems  Constitutional:  Negative for chills and fever.  HENT:  Positive for dental problem. Negative for congestion, facial swelling, rhinorrhea and trouble swallowing.   Respiratory:  Negative for shortness of breath.   Cardiovascular:  Negative for chest pain.  Musculoskeletal:  Negative for neck pain and neck stiffness.    Updated Vital Signs BP 111/60 (BP Location: Right Arm)   Pulse (!) 49   Temp 98.4 F (36.9 C) (Oral)   Resp 20   Ht 5' 7 (1.702 m)   Wt 66.2 kg   SpO2 96%   BMI 22.87 kg/m   Physical Exam Vitals and nursing note reviewed.  Constitutional:      General: He is not in acute distress.    Appearance: He is not ill-appearing or toxic-appearing.  HENT:     Head:     Comments: No facial swelling noted    Mouth/Throat:     Mouth: Mucous membranes are moist.  Comments: Poor dentition noted throughout with chronically eroded gums.  Laxity present to patient's lower front teeth.  No surrounding swelling.  No palpable induration or fluctuance.  No sublingual elevation.  No trismus.  Airway patent.  Uvula midline.  Moist mucous membranes.  Controlling secretions. Neck:     Comments: No swelling noted Pulmonary:     Effort: Pulmonary effort is normal. No respiratory distress.  Skin:    General: Skin is warm and dry.  Neurological:     Mental Status: He is alert.     (all labs ordered are listed, but only abnormal results are displayed) Labs Reviewed - No data to display  EKG: None  Radiology: No results found.  Procedures   Medications Ordered in  the ED - No data to display  Medical Decision Making Risk Prescription drug management.   54 y.o. male presents to the ER today for evaluation of dental pain. Differential diagnosis includes but is not limited to dental pain, dental caries, abscess, ludwig's angina. Vital signs bradycardia (at baseline) otherwise unremarkable. Physical exam as noted above.   Patient has laxity to lower front dentition.  Chronic poor dentition with dental caries and gum erosion seen throughout.  No acute changes noted.  No palpable fluctuance or induration present.  No trismus.  No submental elevation.  Uvula midline, airway patent, controlling secretions.  He has no facial swelling or neck swelling.  No sublingual elevation, doubt any Ludwig's angina.  No fevers any difficulty swallowing or breathing.  Patient likely needing to have teeth extracted.  Will give him dental resources, on-call dentist, and prescribe him Augmentin  for possible underlying infection.  Will give return precautions.  Dose of the medication given here in the ER, wife will be driving.  Spanish interpreter used during this encounter.  We discussed plan at bedside. We discussed strict return precautions and red flag symptoms. The patient verbalized their understanding and agrees to the plan. The patient is stable and being discharged home in good condition.  Portions of this report may have been transcribed using voice recognition software. Every effort was made to ensure accuracy; however, inadvertent computerized transcription errors may be present.    Final diagnoses:  Pain due to dental caries    ED Discharge Orders          Ordered    amoxicillin -clavulanate (AUGMENTIN ) 875-125 MG tablet  Every 12 hours        11/09/24 1752               Bernis Ernst, DEVONNA 11/09/24 2217    Darra Fonda MATSU, MD 11/09/24 2254  "

## 2024-11-09 NOTE — Discharge Instructions (Addendum)
 You were seen in the ER today for evaluation of your dental pain. You will need to see a dentist to have your teeth extracted. I have listed the information for dentist in the area for you to call to schedule an appointment with. For pain, I recommend taking Tylenol . I have included some additional paperwork for you to review. If you have any concerns, new or worsening symptoms, please return to the nearest ER for re-evaluation.   ------------------  Rush acudi a la sala de emergencias para que le evaluaran el dolor de [place]. Necesitar ver a un dentista para que engineering geologist. Le he proporcionado una lista con la informacin de dentistas de la zona para que pueda llamar y engineer, water cita. Para el dolor, le recomiendo tomar Tylenol . Le he incluido algunos documentos adicionales para que los revise. Si tiene alguna inquietud, o si presenta sntomas nuevos o que empeoran, por favor, regrese a la sala de emergencias ms cercana para una nueva evaluacin.  Comunquese con un dentista si: Tiene dolor dental y no sabe por qu. El dolor no se alivia con los united parcel. Sus sntomas empeoran. Aparecen nuevos sntomas. Solicite ayuda de inmediato si: No puede abrir government social research officer. Tiene problemas para respirar o tragar. Tiene fiebre. Tiene hinchazn en la cara, el cuello o la mandbula. Estos sntomas pueden customer service manager. Llame al 911 de inmediato. No espere a ver si los sntomas desaparecen. No conduzca por sus propios medios officemax incorporated.

## 2024-11-10 ENCOUNTER — Other Ambulatory Visit: Payer: Self-pay

## 2024-11-10 ENCOUNTER — Ambulatory Visit: Payer: Self-pay | Admitting: Occupational Therapy

## 2024-11-10 ENCOUNTER — Ambulatory Visit: Payer: Self-pay | Attending: Internal Medicine | Admitting: Physical Therapy

## 2024-11-10 ENCOUNTER — Encounter: Payer: Self-pay | Admitting: Occupational Therapy

## 2024-11-10 VITALS — BP 117/53 | HR 47

## 2024-11-10 DIAGNOSIS — M6281 Muscle weakness (generalized): Secondary | ICD-10-CM | POA: Insufficient documentation

## 2024-11-10 DIAGNOSIS — R2689 Other abnormalities of gait and mobility: Secondary | ICD-10-CM

## 2024-11-10 DIAGNOSIS — R29810 Facial weakness: Secondary | ICD-10-CM | POA: Insufficient documentation

## 2024-11-10 DIAGNOSIS — R41842 Visuospatial deficit: Secondary | ICD-10-CM | POA: Insufficient documentation

## 2024-11-10 DIAGNOSIS — I639 Cerebral infarction, unspecified: Secondary | ICD-10-CM | POA: Insufficient documentation

## 2024-11-10 NOTE — Therapy (Unsigned)
 " OUTPATIENT OCCUPATIONAL THERAPY NEURO EVALUATION  Patient Name: Isaac Ray MRN: [id number] DOB:1971/04/09, 54 y.o., male Today's Date: 11/10/2024  PCP: Juanice Thomes SAUNDERS, FNP  REFERRING PROVIDER: Sherlon Brayton RAMAN, MD  END OF SESSION:  OT End of Session - 11/10/24 1020     Visit Number 1    Number of Visits 9    Date for Recertification  12/11/24    Authorization Type Medicaid pending    OT Start Time 1021    OT Stop Time 1100    OT Time Calculation (min) 39 min    Activity Tolerance Patient tolerated treatment well    Behavior During Therapy WFL for tasks assessed/performed          Past Medical History:  Diagnosis Date   CAD (coronary artery disease)    CVA (cerebral vascular accident) (HCC) 10/23/2024   Essential hypertension    Heart murmur    when I was a teenager- no mention of it now   Hyperlipidemia    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 ;  Surgeon: Elspeth JAYSON Millers, MD;  Location: [facility] OR;  Service: Open Heart Surgery;  Laterality: N/A;   LAPAROSCOPIC APPENDECTOMY N/A 04/25/2019   Procedure: APPENDECTOMY LAPAROSCOPIC;  Surgeon: Kinsinger, Herlene Righter, MD;  Location: [facility] OR;  Service: General;  Laterality: N/A;   LEFT HEART CATHETERIZATION WITH CORONARY ANGIOGRAM N/A 02/14/2015   Procedure: LEFT HEART CATHETERIZATION WITH CORONARY ANGIOGRAM;  Surgeon: Dorn JINNY Lesches, MD;  Location: [facility] CATH LAB;  Service: Cardiovascular;   Laterality: N/A;   LOWER EXTREMITY ANGIOGRAPHY N/A 03/30/2019   Procedure: LOWER EXTREMITY ANGIOGRAPHY;  Surgeon: Lesches Dorn JINNY, MD;  Location: [facility] INVASIVE CV LAB;  Service: Cardiovascular;  Laterality: N/A;   RADIAL ARTERY HARVEST Left 02/16/2015   Procedure: RADIAL ARTERY HARVEST;  Surgeon: Elspeth JAYSON Millers, MD;  Location[facility] OR;  Service: Open Heart Surgery;  Laterality: Left;   TEE WITHOUT CARDIOVERSION N/A 02/16/2015   Procedure: TRANSESOPHAGEAL ECHOCARDIOGRAM (TEE);  Surgeon: Elspeth JAYSON Millers, MD;  Location: [facility] OR;  Service: Open Heart Surgery;  Laterality: N/A;   Patient Active Problem List   Diagnosis Date Noted   Dyslipidemia, goal LDL below 55 11/06/2024   History of CVA (cerebrovascular accident) 11/06/2024   LBBB (left bundle branch block) 11/02/2024   Bradycardia 11/02/2024   HFrEF (heart failure with reduced ejection fraction) (HCC) 10/26/2024   CVA (cerebral vascular accident) (HCC) 10/23/2024   Acute respiratory failure with hypoxia (HCC) 03/14/2021   Abdominal pain 03/14/2021   Long term (current) use of anticoagulants 04/30/2019   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   Acute appendicitis 04/25/2019   S/P laparoscopic appendectomy 04/25/2019   Gangrene of toe of left foot (HCC)    Critical lower limb ischemia (HCC) 03/30/2019   Ischemic cardiomyopathy 03/24/2019   Critical limb ischemia with history of revascularization of same extremity (HCC) 03/24/2019   Type 2 diabetes mellitus (HCC) 02/28/2017   PAOD (peripheral arterial  occlusive disease) 02/08/2017   Essential hypertension 04/20/2015   Hyperlipidemia 04/20/2015   Non-insulin  dependent type 2 diabetes mellitus (HCC) 04/20/2015   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   Reports he bumps into items on the left side about twice a week. He has not returned to work and his family is concerned about his safety. He is stressed about not being able to get back to work.   Pt accompanied by: significant other - wife [name]  PERTINENT HISTORY: 54 y.o. male presented 12/26 after waking up on 10/21/2024 with L sided weakness. Dropping things from L hand, walking is off and wife has noted L facial droop. MRI brain shows acute infarct in right frontal lobe with smaller acute infarcts in the right more posterior right frontal and parietal lobes. PMH: of CAD s/p CABG, HTN, DM2, HLD.  PRECAUTIONS: None  WEIGHT BEARING RESTRICTIONS: No  PAIN:  Are you having pain? No  FALLS: Has patient fallen in last 6 months? No  LIVING ENVIRONMENT: Living Arrangements: Spouse/significant other Available Help at Discharge: Family Type of Home: House Home Access: Stairs to enter Entrance Stairs-Rails: Doctor, General Practice of Steps: 3 Home Layout: One level Home Equipment: None    PLOF: Independent/Modified Independent;Working/employed;Driving Mobility Comments: Independent, works as a curator.  PATIENT GOALS: return to PLOF; pt's family wants him to be safe  OBJECTIVE:  Note: Objective measures were completed at Evaluation unless otherwise noted.  HAND DOMINANCE: Right  ADLs: Overall ADLs: mod I  IADLs: no functional limitations reported at time of eval  MOBILITY STATUS: Ambulates without AD though requires cues for safety to avoid bumping into items on L side  ACTIVITY TOLERANCE: Activity tolerance: good  FUNCTIONAL  OUTCOME MEASURES: Quick Dash: 4.5 % difficulty with use of LUE   UPPER EXTREMITY ROM and MMT:   BUE - WNL with exception to L digit opposition  - dysmetria in comparison to R  HAND FUNCTION: Grip strength: Right: 67.2 lbs; Left: 54.2 lbs  COORDINATION: 9 Hole Peg test: Right: 31 sec; Left: 44 sec  SENSATION: denies paresthesias  EDEMA: none reported or observed  MUSCLE TONE: WFL  COGNITION: Overall cognitive status: Within functional limits for tasks assessed  VISION: Subjective report: He did not wear glasses before his stroke. He has been using his wife's glasses and she finds that he wears them for most of the day Baseline vision: No visual deficits Visual history: brain injury  VISION ASSESSMENT: Requires further testing  PERCEPTION: Requires further testing  PRAXIS: WFL  OBSERVATIONS: Pt ambulates without use of AD. No loss of balance. The pt appears well kept. He appears to be able to answer questions appropriately in Spanish.  TREATMENT:    OT educated pt on rehabilitation process and results of objective measures in relation to pt specific goals. OT encouraged pt and family to bring in exercise balls from home to help determine HEP needs.  PATIENT EDUCATION: Education details: OT role and POC Person educated: Patient and Spouse Education method: Explanation Education comprehension: verbalized understanding  HOME EXERCISE PROGRAM: N/A for this visit  GOALS:  LONG TERM GOALS: Target date: 12/11/24   Patient will demonstrate updated HEP with 25% verbal cues or less for proper execution.  Baseline:  Goal status: INITIAL  2.  Patient will independently recall at least 2 compensatory strategies for visual impairment without cueing. Baseline:  Goal status: INITIAL  3.  Patient will demo improved FM coordination as evidenced by  completing nine-hole peg with use of L in 35 seconds or less. Baseline: Right: 31 sec; Left: 44 sec Goal status: INITIAL  4.  Patient will demonstrate at least 60 lbs L grip strength as needed to open jars and other containers. Baseline: Right: 67.2 lbs; Left: 54.2 lbs Goal status: INITIAL  ASSESSMENT:  CLINICAL IMPRESSION: Patient is a 54 y.o. male who was seen today for occupational therapy evaluation following CVA affecting L visual field and UE use. Hx includes CAD s/p CABG, HTN, DM2, HLD. Patient currently presents below baseline level of functioning demonstrating functional deficits and impairments as noted below. Pt would benefit from skilled OT services in the outpatient setting to work on impairments as noted below to help pt return to PLOF as able.    PERFORMANCE DEFICITS: in functional skills including ADLs, IADLs, coordination, dexterity, strength, Fine motor control, mobility, decreased knowledge of precautions, decreased knowledge of use of DME, vision, and UE functional use.   IMPAIRMENTS: are limiting patient from ADLs, IADLs, work, and leisure.   CO-MORBIDITIES: may have co-morbidities  that affects occupational performance. Patient will benefit from skilled OT to address above impairments and improve overall function.  MODIFICATION OR ASSISTANCE TO COMPLETE EVALUATION: Min-Moderate modification of tasks or assist with assess necessary to complete an evaluation.  OT OCCUPATIONAL PROFILE AND HISTORY: Detailed assessment: Review of records and additional review of physical, cognitive, psychosocial history related to current functional performance.  CLINICAL DECISION MAKING: Moderate - several treatment options, min-mod task modification necessary  REHAB POTENTIAL: Good  EVALUATION COMPLEXITY: Moderate    PLAN:  OT FREQUENCY: 2x/week  OT DURATION: 4 weeks  PLANNED INTERVENTIONS: 97168 OT Re-evaluation, 97535 self care/ADL training, 02889 therapeutic exercise, 97530  therapeutic activity, 97112 neuromuscular re-education, 97035 ultrasound, 97039 fluidotherapy, 97010 moist heat, 97750 Physical Performance Testing, functional mobility training, visual/perceptual remediation/compensation, coping strategies training, patient/family education, and DME and/or AE instructions  RECOMMENDED OTHER SERVICES: Pt could benefit from [facility] to reduce hospitalization by assisting pt with needed resources  CONSULTED AND AGREED WITH PLAN OF CARE: Patient and family member/caregiver  PLAN FOR NEXT SESSION: Vision assessment/strategies; LUE  putty HEP (strength and coordination) - pt to bring in exercise ball his daughter purchased for him.    Jocelyn CHRISTELLA Bottom, OT 11/10/2024, 1:14 PM           "

## 2024-11-10 NOTE — Therapy (Signed)
 " OUTPATIENT PHYSICAL THERAPY NEURO EVALUATION   Patient Name: Isaac Ray MRN: [id number] DOB:July 04, 1971, 54 y.o., male Today's Date: 11/10/2024   PCP: Isaac Cordial, FNP REFERRING PROVIDER: Sherlon Brayton RAMAN, MD  END OF SESSION:  PT End of Session - 11/10/24 1102     Visit Number 1    Number of Visits 1    Date for Recertification  11/11/23   eval only   Authorization Type self-pay    PT Start Time 1100    PT Stop Time 1128   eval   PT Time Calculation (min) 28 min    Activity Tolerance Patient tolerated treatment well    Behavior During Therapy Flat affect          Past Medical History:  Diagnosis Date   CAD (coronary artery disease)    CVA (cerebral vascular accident) (HCC) 10/23/2024   Essential hypertension    Heart murmur    when I was a teenager- no mention of it now   Hyperlipidemia    LV (left ventricular) mural thrombus without MI (HCC) 04/27/2019   Type 2 diabetes mellitus (HCC)    Type II   Past Surgical History:  Procedure Laterality Date   AMPUTATION Left 04/08/2019   Procedure: LEFT FOOT 4TH TOE AMPUTATION;  Surgeon: Isaac Jerona GAILS, MD;  Location: [facility] OR;  Service: Orthopedics;  Laterality: Left;   CORONARY ANGIOPLASTY WITH STENT PLACEMENT  2013   CORONARY ARTERY BYPASS GRAFT N/A 02/16/2015   Procedure: CORONARY ARTERY BYPASS GRAFTING (CABG) x5 using left internal mammary artery, right thigh greater saphenous vein, and left radial artery. ;  Surgeon: Isaac JAYSON Millers, MD;  Location: [facility] OR;  Service: Open Heart Surgery;  Laterality: N/A;   LAPAROSCOPIC APPENDECTOMY N/A 04/25/2019   Procedure: APPENDECTOMY LAPAROSCOPIC;  Surgeon: Kinsinger, Herlene Righter, MD;  Location: [facility] OR;  Service: General;  Laterality: N/A;   LEFT HEART CATHETERIZATION WITH CORONARY ANGIOGRAM N/A 02/14/2015   Procedure: LEFT HEART CATHETERIZATION WITH CORONARY ANGIOGRAM;  Surgeon: Isaac Ray Lesches, MD;  Location: [facility] CATH LAB;  Service: Cardiovascular;  Laterality:  N/A;   LOWER EXTREMITY ANGIOGRAPHY N/A 03/30/2019   Procedure: LOWER EXTREMITY ANGIOGRAPHY;  Surgeon: Ray Isaac JINNY, MD;  Location: [facility] INVASIVE CV LAB;  Service: Cardiovascular;  Laterality: N/A;   RADIAL ARTERY HARVEST Left 02/16/2015   Procedure: RADIAL ARTERY HARVEST;  Surgeon: Isaac JAYSON Millers, MD;  Location: [facility] OR;  Service: Open Heart Surgery;  Laterality: Left;   TEE WITHOUT CARDIOVERSION N/A 02/16/2015   Procedure: TRANSESOPHAGEAL ECHOCARDIOGRAM (TEE);  Surgeon: Isaac JAYSON Millers, MD;  Location: [facility] OR;  Service: Open Heart Surgery;  Laterality: N/A;   Patient Active Problem List   Diagnosis Date Noted   Dyslipidemia, goal LDL below 55 11/06/2024   History of CVA (cerebrovascular accident) 11/06/2024   LBBB (left bundle branch block) 11/02/2024   Bradycardia 11/02/2024   HFrEF (heart failure with reduced ejection fraction) (HCC) 10/26/2024   CVA (cerebral vascular accident) (HCC) 10/23/2024   Acute respiratory failure with hypoxia (HCC) 03/14/2021   Abdominal pain 03/14/2021   Long term (current) use of anticoagulants 04/30/2019   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   Acute appendicitis 04/25/2019   S/P laparoscopic appendectomy 04/25/2019   Gangrene of toe of left foot (HCC)    Critical lower limb ischemia (HCC) 03/30/2019   Ischemic cardiomyopathy 03/24/2019   Critical limb ischemia with history of revascularization of same extremity (HCC) 03/24/2019   Type 2 diabetes mellitus (HCC) 02/28/2017   PAOD (  peripheral arterial occlusive disease) 02/08/2017   Essential hypertension 04/20/2015   Hyperlipidemia 04/20/2015   Non-insulin  dependent type 2 diabetes mellitus (HCC) 04/20/2015   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   NSTEMI (non-ST elevated myocardial infarction) (HCC) 02/12/2015    ONSET DATE: 10/27/2024 (referral date)  REFERRING DIAG: R29.810 (ICD-10-CM) - Facial droop I63.9 (ICD-10-CM) - Cerebrovascular accident (CVA), unspecified mechanism (HCC)  THERAPY  DIAG:  Muscle weakness (generalized)  Other abnormalities of gait and mobility  Rationale for Evaluation and Treatment: Rehabilitation  SUBJECTIVE:                                                                                                                                                                                             SUBJECTIVE STATEMENT:  Pt presents for PT eval with no AD, accompanied by his wife Isaac Ray. Pt reports that he feels like his walking and his balance are close to how he was before his stroke. 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 He does admit to having some visual impairments on his L side.  Pt accompanied by: significant other and interpreter: wife Isaac Ray and interpreter Isaac Ray  PERTINENT HISTORY: hx of CAD s/p CABG, HTN, DM2, HLD  Per hospital note: Isaac Ray is a 54 y.o. male with hx of CAD s/p CABG, HTN, DM2, HLD who woke up on 10/21/2024 with L sided weakness. Dropping things from L hand, walking is off and wife has noted L facial droop. He was coming back from [place] and did not think too much about it. Symptoms persistent so he came to the ED.  PAIN:  Are you having pain? No  PRECAUTIONS: None  RED FLAGS: None   WEIGHT BEARING RESTRICTIONS: No  FALLS: Has patient fallen in last 6 months? No  LIVING ENVIRONMENT: Lives with: lives with their family Lives in: House/apartment No issues accessing home environment  PLOF: Independent  PATIENT GOALS: N/A, at baseline per patient  OBJECTIVE:  Note: Objective measures were completed at Evaluation unless otherwise noted.  DIAGNOSTIC FINDINGS:  Brain MRI 10/24/2024 IMPRESSION: 1. Acute infarct in the right frontal lobe with smaller acute infarcts in the more posterior right frontal and parietal lobes.  Head CT 10/24/2024 IMPRESSION: 1. Near occlusion of the  right MCA bi/trifurcation appears related to acute thromboembolic disease (ELVO), but with Reconstituted right MCA branches except for some of the anterior and middle divisions. 2. Other atherosclerosis in the head and neck, but no other hemodynamically significant stenosis. Prior CABG.  3. Unchanged CT appearance of right MCA infarct since yesterday, no hemorrhagic transformation transformation or significant mass effect. 4. Salient findings communicated by text page to Dr. Vanessa, who called back and we briefly discussed at 05:49 hours on 10/24/2024.  COGNITION: Overall cognitive status: Within functional limits for tasks assessed   SENSATION: Denies any N/T  COORDINATION: WFL BLE  POSTURE: rounded shoulders and forward head  LOWER EXTREMITY ROM:   WFL  Active  Right Eval Left Eval  Hip flexion    Hip extension    Hip abduction    Hip adduction    Hip internal rotation    Hip external rotation    Knee flexion    Knee extension    Ankle dorsiflexion    Ankle plantarflexion    Ankle inversion    Ankle eversion     (Blank rows = not tested)  LOWER EXTREMITY MMT:    MMT Right Eval Left Eval  Hip flexion 5 4+  Hip extension    Hip abduction    Hip adduction    Hip internal rotation    Hip external rotation    Knee flexion 5 4+  Knee extension 5 4+  Ankle dorsiflexion 5 4+  Ankle plantarflexion    Ankle inversion    Ankle eversion    (Blank rows = not tested)  BED MOBILITY:  Not tested Independent per patient report  TRANSFERS: Sit to stand: Modified independence  Assistive device utilized: None     Stand to sit: Modified independence  Assistive device utilized: None     Chair to chair: Modified independence  Assistive device utilized: None       RAMP:  Not tested  CURB:  Not tested  STAIRS:  STAIRS:  Level of Assistance: Modified independence  Stair Negotiation Technique: Alternating Pattern  with No Rails  Number of Stairs: 6   Height  of Stairs: 4  Comments: WFL   GAIT: Findings:  Gait pattern: WFL Distance walked: various clinic distances Assistive device utilized: None Level of assistance: Modified independence Comments: no significant gait abnormalities  FUNCTIONAL TESTS:    [facility] PT Assessment - 11/10/24 1114       Ambulation/Gait   Gait velocity 32.8 ft over 9.75 sec = 3.36 ft/sec   no AD     Standardized Balance Assessment   Standardized Balance Assessment Timed Up and Go Test;Five Times Sit to Stand    Five times sit to stand comments  14.88 sec   no UE     Timed Up and Go Test   TUG Normal TUG    Normal TUG (seconds) 7.65   no AD     Functional Gait  Assessment   Gait assessed  Yes    Gait Level Surface Walks 20 ft in less than 7 sec but greater than 5.5 sec, uses assistive device, slower speed, mild gait deviations, or deviates 6-10 in outside of the 12 in walkway width.    Change in Gait Speed Able to smoothly change walking speed without loss of balance or gait deviation. Deviate no more than 6 in outside of the 12 in walkway width.    Gait with Horizontal Head Turns Performs head turns smoothly with no change in gait. Deviates no more than 6 in outside 12 in walkway width    Gait with Vertical Head Turns Performs head turns with no change in gait. Deviates no more than 6 in outside 12 in walkway width.    Gait and  Pivot Turn Pivot turns safely within 3 sec and stops quickly with no loss of balance.    Step Over Obstacle Is able to step over 2 stacked shoe boxes taped together (9 in total height) without changing gait speed. No evidence of imbalance.    Gait with Narrow Base of Support Is able to ambulate for 10 steps heel to toe with no staggering.    Gait with Eyes Closed Walks 20 ft, no assistive devices, good speed, no evidence of imbalance, normal gait pattern, deviates no more than 6 in outside 12 in walkway width. Ambulates 20 ft in less than 7 sec.    Ambulating Backwards Walks 20 ft, no  assistive devices, good speed, no evidence for imbalance, normal gait    Steps Alternating feet, no rail.    Total Score 29    FGA comment: 29/30                                                                                                                                      TREATMENT DATE:  PT Evaluation  Self-Care/Home Management: Vitals:   11/10/24 1110  BP: (!) 117/53  Pulse: (!) 47   BP assessed in RUE in sitting at rest, diastolic slightly hypotensive and HR low. Per pt and his wife and per his chart his HR tends to run low. He is on medication for his BP and has been checking his BP at home. Encouraged him to continue to monitor his BP at home (check 2x/day) and to record his #s to observe any trends. Encouraged him to monitor for any dizziness/lightheadedness especially in standing.   PATIENT EDUCATION: Education details: Eval findings, results of OM and functional implications, see above regarding BP, no recommendations for PT at this time but can obtain a new referral in the future if needed Person educated: Patient and Spouse Education method: Explanation and Demonstration Education comprehension: verbalized understanding and returned demonstration  HOME EXERCISE PROGRAM: N/A  GOALS: N/A  ASSESSMENT:  CLINICAL IMPRESSION: Patient is a 54 year old male referred to Neuro OPPT for CVA.   Pt's PMH is significant for: CAD s/p CABG, HTN, DM2, HLD. The following deficits were present during the exam: mild LLE weakness and mild balance impairments but overall WFL. Pt also with some L visual impairments per OT evaluation. Pt does not need skilled PT services at this time as his needs can be met with OT intervention. Educated patient and his wife that if anything changes he can obtain a new referral to return to PT in the future.   OBJECTIVE IMPAIRMENTS: decreased balance, decreased strength, and impaired vision/preception.   ACTIVITY LIMITATIONS: N/A  PARTICIPATION  LIMITATIONS: occupation equities trader)  CLINICAL DECISION MAKING: Stable/uncomplicated  EVALUATION COMPLEXITY: Low  PLAN:  PT FREQUENCY: one time visit  PT DURATION: 1 sessions (eval only)    Waddell Southgate, PT Waddell Southgate, PT, DPT, CSRS  11/10/2024,  11:36 AM        "

## 2024-11-13 ENCOUNTER — Telehealth: Payer: Self-pay | Admitting: *Deleted

## 2024-11-13 NOTE — Progress Notes (Signed)
 Care Guide Pharmacy Note  11/13/2024 Name: KAELAN AMBLE MRN: [id number] DOB: 1971-06-19  Referred By: Paseda, Folashade R, FNP Reason for referral: Complex Care Management (Initial outreach to schedule referral with PharmD)   Sheree KANDICE Lozano-Villareal is a 54 y.o. year old male who is a primary care patient of Paseda, Folashade R, FNP.  Rajveer Handler Lozano-Villareal was referred to the pharmacist for assistance related to: HTN, HLD, and DMII  Successful contact was made with the patient to discuss pharmacy services including being ready for the pharmacist to call at least 5 minutes before the scheduled appointment time and to have medication bottles and any blood pressure readings ready for review. The patient agreed to meet with the pharmacist via in person 01/05/25 at 2 PM  on (date/time).  Harlene Satterfield  [facility]  [facility] Guide  Direct Dial: [phone]  Fax [phone]

## 2024-11-16 ENCOUNTER — Ambulatory Visit (HOSPITAL_COMMUNITY): Payer: Self-pay

## 2024-11-16 NOTE — Telephone Encounter (Signed)
 Spoke to the daughter and they have not received the application yet. I will call her thurs to see if she has received it and if not mail out again

## 2024-11-17 ENCOUNTER — Ambulatory Visit: Payer: Self-pay | Admitting: Occupational Therapy

## 2024-11-17 DIAGNOSIS — R41842 Visuospatial deficit: Secondary | ICD-10-CM

## 2024-11-17 DIAGNOSIS — M6281 Muscle weakness (generalized): Secondary | ICD-10-CM

## 2024-11-17 DIAGNOSIS — R2689 Other abnormalities of gait and mobility: Secondary | ICD-10-CM

## 2024-11-17 NOTE — Therapy (Unsigned)
 " OUTPATIENT OCCUPATIONAL THERAPY NEURO TREATMENT  Patient Name: Isaac Ray MRN: [id number] DOB:1971-09-22, 54 y.o., male Today's Date: 11/17/2024  PCP: Juanice Thomes SAUNDERS, FNP  REFERRING PROVIDER: Sherlon Brayton RAMAN, MD  END OF SESSION:  OT End of Session - 11/17/24 0858     Visit Number 2    Number of Visits 9    Date for Recertification  12/11/24    Authorization Type Medicaid pending    OT Start Time 0852    OT Stop Time 0930    OT Time Calculation (min) 38 min    Activity Tolerance Patient tolerated treatment well    Behavior During Therapy [facility] for tasks assessed/performed         Past Medical History:  Diagnosis Date   CAD (coronary artery disease)    CVA (cerebral vascular accident) (HCC) 10/23/2024   Essential hypertension    Heart murmur    when I was a teenager- no mention of it now   Hyperlipidemia    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 ;  Surgeon: Elspeth JAYSON Millers, MD;  Location: [facility] OR;  Service: Open Heart Surgery;  Laterality: N/A;   LAPAROSCOPIC APPENDECTOMY N/A 04/25/2019   Procedure: APPENDECTOMY LAPAROSCOPIC;  Surgeon: Kinsinger, Herlene Righter, MD;  Location: [facility] OR;  Service: General;  Laterality: N/A;   LEFT HEART CATHETERIZATION WITH CORONARY ANGIOGRAM N/A 02/14/2015   Procedure: LEFT HEART CATHETERIZATION WITH CORONARY ANGIOGRAM;  Surgeon: Dorn JINNY Lesches, MD;  Location: [facility] CATH LAB;  Service: Cardiovascular;   Laterality: N/A;   LOWER EXTREMITY ANGIOGRAPHY N/A 03/30/2019   Procedure: LOWER EXTREMITY ANGIOGRAPHY;  Surgeon: Lesches Dorn JINNY, MD;  Location: [facility] INVASIVE CV LAB;  Service: Cardiovascular;  Laterality: N/A;   RADIAL ARTERY HARVEST Left 02/16/2015   Procedure: RADIAL ARTERY HARVEST;  Surgeon: Elspeth JAYSON Millers, MD;  Location: [facility] OR;  Service: Open Heart Surgery;  Laterality: Left;   TEE WITHOUT CARDIOVERSION N/A 02/16/2015   Procedure: TRANSESOPHAGEAL ECHOCARDIOGRAM (TEE);  Surgeon: Elspeth JAYSON Millers, MD;  Location: [facility] OR;  Service: Open Heart Surgery;  Laterality: N/A;   Patient Active Problem List   Diagnosis Date Noted   Dyslipidemia, goal LDL below 55 11/06/2024   History of CVA (cerebrovascular accident) 11/06/2024   LBBB (left bundle branch block) 11/02/2024   Bradycardia 11/02/2024   HFrEF (heart failure with reduced ejection fraction) (HCC) 10/26/2024   CVA (cerebral vascular accident) (HCC) 10/23/2024   Acute respiratory failure with hypoxia (HCC) 03/14/2021   Abdominal pain 03/14/2021   Long term (current) use of anticoagulants 04/30/2019   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   Acute appendicitis 04/25/2019   S/P laparoscopic appendectomy 04/25/2019   Gangrene of toe of left foot (HCC)    Critical lower limb ischemia (HCC) 03/30/2019   Ischemic cardiomyopathy 03/24/2019   Critical limb ischemia with history of revascularization of same extremity (HCC) 03/24/2019   Type 2 diabetes mellitus (HCC) 02/28/2017   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/08/2017   Essential hypertension 04/20/2015   Hyperlipidemia 04/20/2015   Non-insulin  dependent type 2 diabetes mellitus (HCC) 04/20/2015   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   Pt accompanied by: significant other - wife Isaac Ray  PERTINENT HISTORY: 54 y.o. male presented 12/26 after waking up on 10/21/2024 with L sided weakness. Dropping things from L hand, walking is off and wife has noted L facial droop. MRI brain shows acute infarct in right frontal lobe with smaller acute infarcts in the right more posterior right frontal and parietal lobes. PMH: of CAD s/p CABG, HTN, DM2, HLD.  PRECAUTIONS: None  WEIGHT BEARING RESTRICTIONS: No  PAIN:  Are you having pain? No  FALLS: Has patient fallen in last 6 months? No  LIVING ENVIRONMENT: Living Arrangements: Spouse/significant other Available Help at Discharge: Family Type of Home: House Home Access: Stairs to enter Entrance Stairs-Rails: Doctor, General Practice of Steps: 3 Home Layout: One level Home Equipment: None    PLOF: Independent/Modified Independent;Working/employed;Driving Mobility Comments: Independent, works as a curator.  PATIENT GOALS: return to PLOF; pt's family wants him to be safe  OBJECTIVE:  Note: Objective measures were completed at Evaluation unless otherwise noted.  HAND DOMINANCE: Right  ADLs: Overall ADLs: mod I  IADLs: no functional limitations reported at time of eval  MOBILITY STATUS: Ambulates without AD though requires cues for safety to avoid bumping into items on L side  ACTIVITY TOLERANCE: Activity tolerance: good  FUNCTIONAL OUTCOME MEASURES: Quick Dash: 4.5 % difficulty with use of LUE   UPPER EXTREMITY ROM and MMT:   BUE - WNL with exception to L digit opposition  - dysmetria in comparison to R  HAND FUNCTION: Grip strength: Right: 67.2 lbs; Left: 54.2 lbs  COORDINATION: 9 Hole Peg  test: Right: 31 sec; Left: 44 sec  SENSATION: denies paresthesias  EDEMA: none reported or observed  MUSCLE TONE: WFL  COGNITION: Overall cognitive status: Within functional limits for tasks assessed  VISION: Subjective report: He did not wear glasses before his stroke. He has been using his wife's glasses and she finds that he wears them for most of the day Baseline vision: No visual deficits Visual history: brain injury  VISION ASSESSMENT: 11/17/2024: OT assessed tracking and visual fields, which were [facility]; environmental scanning in hallway was indicative of slight impairment to lower quadrant, especially on L side.  PERCEPTION: Requires further testing - concerns for limited attention to L side  PRAXIS: WFL  OBSERVATIONS: Pt ambulates without use of AD. No loss of balance. The pt appears well kept. He appears to be able to answer questions appropriately in Spanish.  TREATMENT:    OT assessed visual tracking, visual fields, and scanning in a functional environment as noted above. OT provided education on precautions secondary to visual impairments including scanning strategies and safety with task completion.  OT educated pt on table top play of Golf Solitaire for LUE to address fine motor coordination, gross motor coordination, upper extremity range of motion, reaction time, scanning and locating of items, processing, bimanual coordination/trunk control, sequencing of unfamiliar motor movements or tasks, motor planning, item/pattern recognition, and endurance/stamina. Pt required moderate cues for proper play. OT educated on visual clutter and use of contrast to improve independence and safety with ADL and IADL completion.  Encouraged home completion of word searches for additional scanning.  PATIENT EDUCATION: Education details: vision; manufacturing systems engineer Person  educated: Patient and Spouse Education method: Explanation Education comprehension: verbalized understanding  HOME EXERCISE PROGRAM: N/A for this visit  GOALS:  LONG TERM GOALS: Target date: 12/11/24   Patient will demonstrate updated HEP with 25% verbal cues or less for proper execution.  Baseline:  Goal status: INITIAL  2.  Patient will independently recall at least 2 compensatory strategies for visual impairment without cueing. Baseline:  Goal status: INITIAL  3.  Patient will demo improved FM coordination as evidenced by completing nine-hole peg with use of L in 35 seconds or less. Baseline: Right: 31 sec; Left: 44 sec Goal status: INITIAL  4.  Patient will demonstrate at least 60 lbs L grip strength as needed to open jars and other containers. Baseline: Right: 67.2 lbs; Left: 54.2 lbs Goal status: INITIAL  ASSESSMENT:  CLINICAL IMPRESSION: Patient is a 54 y.o. male who was seen today for occupational therapy evaluation following CVA affecting L visual field and UE use. Pt appears to be compensating for vision mostly WFL though still has impaired scanning to L, inferior field. This put him at risk for fall and injury. Initiated functional card game today for scanning and processing. Will need to have handout translated for improved carryover as needed for goal progression.   PERFORMANCE DEFICITS: in functional skills including ADLs, IADLs, coordination, dexterity, strength, Fine motor control, mobility, decreased knowledge of precautions, decreased knowledge of use of DME, vision, and UE functional use.   IMPAIRMENTS: are limiting patient from ADLs, IADLs, work, and leisure.   CO-MORBIDITIES: may have co-morbidities  that affects occupational performance. Patient will benefit from skilled OT to address above impairments and improve overall function.  REHAB POTENTIAL: Good  PLAN:  OT FREQUENCY: 2x/week  OT DURATION: 4 weeks  PLANNED INTERVENTIONS: 97168 OT  Re-evaluation, 97535 self care/ADL training, 02889 therapeutic exercise, 97530 therapeutic activity, 97112 neuromuscular re-education, 97035 ultrasound, 97039 fluidotherapy, 97010 moist heat, 97750 Physical Performance Testing, functional mobility training, visual/perceptual remediation/compensation, coping strategies training, patient/family education, and DME and/or AE instructions  RECOMMENDED OTHER SERVICES: Pt could benefit from [facility] to reduce hospitalization by assisting pt with needed resources  CONSULTED AND AGREED WITH PLAN OF CARE: Patient and family member/caregiver  PLAN FOR NEXT SESSION: Vision assessment/strategies; LUE  putty HEP (strength and coordination) - pt to bring in exercise ball his daughter purchased for him.    Jocelyn CHRISTELLA Bottom, OT 11/17/2024, 8:59 AM           "

## 2024-11-19 ENCOUNTER — Ambulatory Visit: Payer: Self-pay | Admitting: Occupational Therapy

## 2024-11-19 DIAGNOSIS — R41842 Visuospatial deficit: Secondary | ICD-10-CM

## 2024-11-19 DIAGNOSIS — M6281 Muscle weakness (generalized): Secondary | ICD-10-CM

## 2024-11-19 NOTE — Therapy (Signed)
 " OUTPATIENT OCCUPATIONAL THERAPY NEURO TREATMENT  Patient Name: Isaac Ray MRN: [id number] DOB:1971-01-15, 54 y.o., male Today's Date: 11/19/2024  PCP: Juanice Thomes SAUNDERS, FNP  REFERRING PROVIDER: Sherlon Brayton RAMAN, MD  END OF SESSION:  OT End of Session - 11/19/24 1632     Visit Number 3    Number of Visits 9    Date for Recertification  12/11/24    Authorization Type Medicaid pending    OT Start Time 0851    OT Stop Time 0931    OT Time Calculation (min) 40 min    Activity Tolerance Patient tolerated treatment well    Behavior During Therapy [facility] for tasks assessed/performed         Past Medical History:  Diagnosis Date   CAD (coronary artery disease)    CVA (cerebral vascular accident) (HCC) 10/23/2024   Essential hypertension    Heart murmur    when I was a teenager- no mention of it now   Hyperlipidemia    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 ;  Surgeon: Elspeth JAYSON Millers, MD;  Location: [facility] OR;  Service: Open Heart Surgery;  Laterality: N/A;   LAPAROSCOPIC APPENDECTOMY N/A 04/25/2019   Procedure: APPENDECTOMY LAPAROSCOPIC;  Surgeon: Kinsinger, Herlene Righter, MD;  Location: [facility] OR;  Service: General;  Laterality: N/A;   LEFT HEART CATHETERIZATION WITH CORONARY ANGIOGRAM N/A 02/14/2015   Procedure: LEFT HEART CATHETERIZATION WITH CORONARY ANGIOGRAM;  Surgeon: Dorn JINNY Lesches, MD;  Location: [facility] CATH LAB;  Service: Cardiovascular;   Laterality: N/A;   LOWER EXTREMITY ANGIOGRAPHY N/A 03/30/2019   Procedure: LOWER EXTREMITY ANGIOGRAPHY;  Surgeon: Lesches Dorn JINNY, MD;  Location: [facility] INVASIVE CV LAB;  Service: Cardiovascular;  Laterality: N/A;   RADIAL ARTERY HARVEST Left 02/16/2015   Procedure: RADIAL ARTERY HARVEST;  Surgeon: Elspeth JAYSON Millers, MD;  Location: [facility] OR;  Service: Open Heart Surgery;  Laterality: Left;   TEE WITHOUT CARDIOVERSION N/A 02/16/2015   Procedure: TRANSESOPHAGEAL ECHOCARDIOGRAM (TEE);  Surgeon: Elspeth JAYSON Millers, MD;  Location: [facility] OR;  Service: Open Heart Surgery;  Laterality: N/A;   Patient Active Problem List   Diagnosis Date Noted   Dyslipidemia, goal LDL below 55 11/06/2024   History of CVA (cerebrovascular accident) 11/06/2024   LBBB (left bundle branch block) 11/02/2024   Bradycardia 11/02/2024   HFrEF (heart failure with reduced ejection fraction) (HCC) 10/26/2024   CVA (cerebral vascular accident) (HCC) 10/23/2024   Acute respiratory failure with hypoxia (HCC) 03/14/2021   Abdominal pain 03/14/2021   Long term (current) use of anticoagulants 04/30/2019   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   Acute appendicitis 04/25/2019   S/P laparoscopic appendectomy 04/25/2019   Gangrene of toe of left foot (HCC)    Critical lower limb ischemia (HCC) 03/30/2019   Ischemic cardiomyopathy 03/24/2019   Critical limb ischemia with history of revascularization of same extremity (HCC) 03/24/2019   Type 2 diabetes mellitus (HCC) 02/28/2017   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   Pt accompanied by: significant other - wife [name]  PERTINENT HISTORY: 54 y.o. male presented 12/26 after waking up on 10/21/2024 with L sided weakness. Dropping things from L hand, walking is off and wife has noted L facial droop. MRI brain shows acute infarct in right frontal lobe with smaller acute infarcts in the right more posterior right frontal and parietal lobes. PMH: of CAD s/p CABG, HTN, DM2, HLD.  PRECAUTIONS: None  WEIGHT BEARING RESTRICTIONS: No  PAIN:  Are you having pain? No  FALLS: Has patient fallen in last 6 months? No  LIVING ENVIRONMENT: Living Arrangements: Spouse/significant other Available Help at Discharge: Family Type of Home: House Home Access: Stairs to enter Entrance Stairs-Rails: Doctor, General Practice of Steps: 3 Home Layout: One level Home Equipment: None    PLOF: Independent/Modified Independent;Working/employed;Driving Mobility Comments: Independent, works as a curator.  PATIENT GOALS: return to PLOF; pt's family wants him to be safe  OBJECTIVE:  Note: Objective measures were completed at Evaluation unless otherwise noted.  HAND DOMINANCE: Right  ADLs: Overall ADLs: mod I  IADLs: no functional limitations reported at time of eval  MOBILITY STATUS: Ambulates without AD though requires cues for safety to avoid bumping into items on L side  ACTIVITY TOLERANCE: Activity tolerance: good  FUNCTIONAL OUTCOME MEASURES: Quick Dash: 4.5 % difficulty with use of LUE   UPPER EXTREMITY ROM and MMT:   BUE - WNL with exception to L digit opposition  - dysmetria in comparison to R  HAND FUNCTION: Grip strength: Right: 67.2 lbs; Left: 54.2 lbs  COORDINATION: 9 Hole Peg test: Right: 31 sec; Left: 44 sec  SENSATION: denies  paresthesias  EDEMA: none reported or observed  MUSCLE TONE: WFL  COGNITION: Overall cognitive status: Within functional limits for tasks assessed  VISION: Subjective report: He did not wear glasses before his stroke. He has been using his wife's glasses and she finds that he wears them for most of the day Baseline vision: No visual deficits Visual history: brain injury  VISION ASSESSMENT: 11/17/2024: OT assessed tracking and visual fields, which were [facility]; environmental scanning in hallway was indicative of slight impairment to lower quadrant, especially on L side.  PERCEPTION: Requires further testing - concerns for limited attention to L side  PRAXIS: WFL  OBSERVATIONS: Pt ambulates without use of AD. No loss of balance. The pt appears well kept. He appears to be able to answer questions appropriately in Spanish.  TREATMENT:    OT reveiwed table top play of Golf Solitaire for LUE to address fine motor coordination, gross motor coordination, upper extremity range of motion, reaction time, scanning and locating of items, processing, bimanual coordination/trunk control, sequencing of unfamiliar motor movements or tasks, motor planning, item/pattern recognition, and endurance/stamina. Pt required maximal cues for proper play. OT educated on visual clutter and use of contrast to improve independence and safety with ADL and IADL completion.   PATIENT EDUCATION: Education details: Clinical Research Associate educated: Patient and Spouse Education method: Programmer, Multimedia, Facilities Manager, Verbal cues, and Handouts Education comprehension: verbalized understanding, returned demonstration, verbal cues required, and needs further education  HOME EXERCISE PROGRAM: 11/19/2024: golf solitaire in spanish  GOALS:  LONG TERM GOALS: Target date: 12/11/24   Patient will demonstrate  updated HEP with 25% verbal cues or less for proper execution.  Baseline:  Goal status: IN PROGRESS  2.  Patient will independently recall at least 2 compensatory strategies for visual impairment without cueing. Baseline:  Goal status: INITIAL  3.  Patient will demo improved FM coordination as evidenced by completing nine-hole peg with use of L in 35 seconds or less. Baseline: Right: 31 sec; Left: 44 sec Goal status: INITIAL  4.  Patient will demonstrate at least 60 lbs L grip strength as needed to open jars and other containers. Baseline: Right: 67.2 lbs; Left: 54.2 lbs Goal status: INITIAL  ASSESSMENT:  CLINICAL IMPRESSION: Patient is a 54 y.o. male who was seen today for occupational therapy evaluation following CVA affecting L visual field and UE use. Pt appears to be compensating for vision mostly WFL though still has impaired scanning to L, inferior field. This put him at risk for fall and injury. Reviewed functional card game today with handout for scanning and processing.   PERFORMANCE DEFICITS: in functional skills including ADLs, IADLs, coordination, dexterity, strength, Fine motor control, mobility, decreased knowledge of precautions, decreased knowledge of use of DME, vision, and UE functional use.   IMPAIRMENTS: are limiting patient from ADLs, IADLs, work, and leisure.   CO-MORBIDITIES: may have co-morbidities  that affects occupational performance. Patient will benefit from skilled OT to address above impairments and improve overall function.  REHAB POTENTIAL: Good  PLAN:  OT FREQUENCY: 2x/week  OT DURATION: 4 weeks  PLANNED INTERVENTIONS: 97168 OT Re-evaluation, 97535 self care/ADL training, 02889 therapeutic exercise, 97530 therapeutic activity, 97112 neuromuscular re-education, 97035 ultrasound, 97039 fluidotherapy, 97010 moist heat, 97750 Physical Performance Testing, functional mobility training, visual/perceptual remediation/compensation, coping strategies  training, patient/family education, and DME and/or AE instructions  RECOMMENDED OTHER SERVICES: Pt could benefit from [facility] to reduce hospitalization by assisting pt with needed resources  CONSULTED AND AGREED WITH PLAN OF CARE: Patient and family member/caregiver  PLAN FOR NEXT SESSION: LUE  putty HEP (strength and coordination) - pt to bring in exercise ball his daughter purchased for him as well as Spanish deck of cards   Jocelyn CHRISTELLA Bottom, OT 11/19/2024, 4:33 PM           "

## 2024-11-19 NOTE — Telephone Encounter (Signed)
 Mailed jardiance  application again   Provider portion scanned on 10/26/24 under jardiance  assistance application

## 2024-11-23 ENCOUNTER — Telehealth: Payer: Self-pay | Admitting: *Deleted

## 2024-11-23 NOTE — Progress Notes (Signed)
 Complex Care Management Note  Care Guide Note 11/23/2024 Name: Isaac Ray MRN: [id number] DOB: December 03, 1970  [name] is a 54 y.o. year old male who sees Paseda, Folashade R, FNP for primary care. I reached out to [facility] by phone today to offer complex care management services. Daughter agreed to services. DPR on file   [name] was given information about Complex Care Management services today including:   The Complex Care Management services include support from the care team which includes your Nurse Care Manager, Clinical Social Worker, or Pharmacist.  The Complex Care Management team is here to help remove barriers to the health concerns and goals most important to you. Complex Care Management services are voluntary, and the patient may decline or stop services at any time by request to their care team member.   Complex Care Management Consent Status: Patient agreed to services and verbal consent obtained.   Follow up plan:  Telephone appointment with complex care management team member scheduled for:  11/24/24 RNCM and 12/02/24 with LCSW   Encounter Outcome:  Patient Scheduled  Harlene Satterfield  [facility]  [facility] Guide  Direct Dial: [phone]  Fax [phone]

## 2024-11-24 ENCOUNTER — Other Ambulatory Visit: Payer: Self-pay

## 2024-11-24 ENCOUNTER — Ambulatory Visit: Payer: Self-pay | Admitting: Occupational Therapy

## 2024-11-24 NOTE — Patient Instructions (Signed)
 Visit Information  Thank you for taking time to visit with me today. Please don't hesitate to contact me if I can be of assistance to you before our next scheduled appointment.  Our next appointment is by telephone on Wednesday, February 11th  at 2:00pm Please call the care guide team at [phone] if you need to cancel or reschedule your appointment.   Following is a copy of your care plan:   Goals Addressed             This Visit's Progress    VBCI RN Care Plan       Problems:  Chronic Disease Management support and education needs related to DMII  Goal: Over the next 3 months the Patient will demonstrate Improved adherence to prescribed treatment plan for DMII as evidenced by decrease in A1C with goal of <7 demonstrate Improved health management independence as evidenced by making eye appointment for DM exam        Demonstrate Improved health management independence AEB making dental appt.   Interventions:   Health Maintenance Interventions: Patient interviewed about adult health maintenance status including  Regular Dental Care    Diabetes Eye Exam      Diabetes Interventions: Assessed patient's understanding of A1c goal: <7% Reviewed medications with patient and discussed importance of medication adherence Review of patient status, including review of consultants reports, relevant laboratory and other test results, and medications completed Screening for signs and symptoms of depression related to chronic disease state  Assessed social determinant of health barriers Lab Results  Component Value Date   HGBA1C 8.6 (H) 10/24/2024    Patient Self-Care Activities:  Attend all scheduled provider appointments Call pharmacy for medication refills 3-7 days in advance of running out of medications Take medications as prescribed   Work with the social worker to address care coordination needs and will continue to work with the clinical team to address health care and disease  management related needs schedule appointment with eye doctor check blood sugar at prescribed times: three times daily check feet daily for cuts, sores or redness  Plan:  Telephone follow up appointment with care management team member scheduled for:  Wednesday, February 11th at 2pm.             Please call the [place]  National Suicide Prevention Lifeline: 916-351-4461 or TTY: 4407837979 TTY [phone]) to talk to a trained counselor if you are experiencing a Mental Health or Behavioral Health Crisis or need someone to talk to.  Patient verbalized understanding of Care plan and visit instructions communicated this visit  Santana Stamp BSN, CCM [place]  [facility] Population Health RN Care Manager Direct Dial: [phone]  Fax: [phone]

## 2024-11-24 NOTE — Patient Outreach (Signed)
 Complex Care Management   Visit Note  11/24/2024  Name:  Isaac Ray MRN: [id number] DOB: 1971/03/22  Situation: Referral received for Complex Care Management related to Hx of CVA/DM/currently uninsured. I obtained verbal consent from Patient.  Visit completed with daughter, Juanita Lozano and patient using Spanish Interpreter on speaker phone.  Denies tooth pain today.  Main concern is getting approved for Medicaid so he can continue getting much needed medications, make dental and vision appointments.   Background:   -ED visit 11/09/24 for tooth pain, given Augmentin  for 7 days, prescribed Tylenol  for pain given list of dentists to call for appt.  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 Neurological Management Strategies: Medication therapy Neurological Comment: When he stands for a long period of time, he gets dizzy, resolves with rest.  HEENT HEENT Symptoms Reported: No symptoms reported HEENT Comment: Waiting for Medicaid approval before making appointments for vision and dental.  Had ED visit 11/09/24  for tooth pain, prescribed antibiotics which have been completed, instructed to take Tylenol  for pain, given a list of dentists to call for appt.  Denies tooth pain today.     Cardiovascular Cardiovascular Symptoms Reported: No symptoms reported Cardiovascular Management Strategies: Medication therapy, Routine screening  Respiratory Respiratory Symptoms Reported: No symptoms reported    Endocrine Endocrine Symptoms Reported: No symptoms reported Is patient diabetic?: Yes Is patient checking blood sugars at home?: Yes List most recent blood sugar readings, include date and time of day: 123mg /dL Endocrine Comment: J8R 8.6 10/24/24.  Takes blood sugars three times a day.  Gastrointestinal Gastrointestinal Symptoms Reported: No symptoms reported      Genitourinary Genitourinary Symptoms Reported: No symptoms reported    Integumentary Integumentary Symptoms Reported: No symptoms reported    Musculoskeletal Musculoskelatal Symptoms Reviewed: No symptoms reported   Falls in the past year?: No    Psychosocial       Quality of Family Relationships: helpful, involved, supportive Do you feel physically threatened by others?: No    11/24/2024    PHQ2-9 Depression Screening   Little interest or pleasure in doing things Not at all  Feeling down, depressed, or hopeless Not at all  PHQ-2 - Total Score 0  Trouble falling or staying asleep, or sleeping too much    Feeling tired or having little energy    Poor appetite or overeating     Feeling bad about yourself - or that you are a failure or have let yourself or your family down    Trouble concentrating on things, such as reading the newspaper or watching television    Moving or speaking so slowly that other people could have noticed.  Or the opposite - being so fidgety or restless that you have been moving around a lot more than usual    Thoughts that you  would be better off dead, or hurting yourself in some way    PHQ2-9 Total Score    If you checked off any problems, how difficult have these problems made it for you to do your work, take care of things at home, or get along with other people    Depression  Interventions/Treatment      There were no vitals filed for this visit. Pain Scale: 0-10 Pain Score: 0-No pain  Medications Reviewed Today     Reviewed by Lucian Santana LABOR, RN (Registered Nurse) on 11/24/24 at 1313  Med List Status: <None>   Medication Order Taking? Sig Documenting Provider Last Dose Status Informant  amoxicillin -clavulanate (AUGMENTIN ) 875-125 MG tablet [id number]  Take 1 tablet by mouth every 12 (twelve) hours.  Patient not taking: Reported on 11/24/2024   Bernis Ernst, PA-C  Active   apixaban  (ELIQUIS ) 5 MG TABS tablet [id number] Yes Take 1 tablet (5 mg total) by mouth 2 (two) times daily. Paseda, Folashade R, FNP  Active   atorvastatin  (LIPITOR ) 80 MG tablet [id number] Yes Take 1 tablet (80 mg total) by mouth daily. Paseda, Folashade R, FNP  Active   Blood Glucose Monitoring Suppl (BLOOD GLUCOSE MONITOR SYSTEM) w/Device KIT [id number] Yes Testing in the morning, at noon, and at bedtime. Paseda, Folashade R, FNP  Active   dapagliflozin  propanediol (FARXIGA ) 5 MG TABS tablet [id number] Yes Take 1 tablet (5 mg total) by mouth daily before breakfast. Paseda, Folashade R, FNP  Active   glipiZIDE  (GLUCOTROL  XL) 2.5 MG 24 hr tablet [id number] Yes Take 1 tablet (2.5 mg total) by mouth daily with breakfast. Paseda, Folashade R, FNP  Active   Glucose Blood (BLOOD GLUCOSE TEST STRIPS) STRP [id number] Yes Use to test in the morning, at noon, and at bedtime.  Paseda, Folashade R, FNP  Active   Lancet Device MISC [id number] Yes Use to check blood sugar Paseda, Folashade R, FNP  Active   Lancets MISC [id number] Yes Use up to four times daily as directed. (FOR ICD-10 E10.9, E11.9). Paseda, Folashade R, FNP  Active   metFORMIN  (GLUCOPHAGE ) 1000 MG tablet [id number] Yes Take 1 tablet (1,000 mg total) by mouth 2 (two) times daily with a meal. Paseda, Folashade R, FNP  Active   sacubitril -valsartan  (ENTRESTO ) 24-26 MG [id number] Yes Take 1 tablet by mouth 2 (two) times daily. Elgergawy, Brayton RAMAN, MD   Active   spironolactone  (ALDACTONE ) 25 MG tablet [id number] Yes Take 1 tablet (25 mg total) by mouth daily. Lee, Jordan, NP  Active             Recommendation:   Specialty provider follow-up : Cardiology 11/25/24; Rehabilitation 11/27/24; VBCI LCSW 12/02/24  Follow Up Plan:   Telephone follow-up two weeks.  Santana Lucian BSN, CCM   VBCI Population Health RN Care Manager Direct Dial: [phone]  Fax: [phone]

## 2024-11-24 NOTE — Patient Instructions (Signed)
 SABRA

## 2024-11-25 ENCOUNTER — Encounter: Payer: Self-pay | Admitting: Cardiovascular Disease

## 2024-11-25 ENCOUNTER — Telehealth: Payer: Self-pay | Admitting: Pharmacist

## 2024-11-25 ENCOUNTER — Other Ambulatory Visit: Payer: Self-pay

## 2024-11-25 ENCOUNTER — Ambulatory Visit: Payer: Self-pay | Attending: Cardiovascular Disease | Admitting: Cardiovascular Disease

## 2024-11-25 VITALS — BP 120/78 | HR 64 | Ht 67.0 in | Wt 143.0 lb

## 2024-11-25 DIAGNOSIS — I513 Intracardiac thrombosis, not elsewhere classified: Secondary | ICD-10-CM

## 2024-11-25 DIAGNOSIS — I447 Left bundle-branch block, unspecified: Secondary | ICD-10-CM

## 2024-11-25 DIAGNOSIS — I779 Disorder of arteries and arterioles, unspecified: Secondary | ICD-10-CM

## 2024-11-25 DIAGNOSIS — E785 Hyperlipidemia, unspecified: Secondary | ICD-10-CM

## 2024-11-25 DIAGNOSIS — Z8673 Personal history of transient ischemic attack (TIA), and cerebral infarction without residual deficits: Secondary | ICD-10-CM

## 2024-11-25 DIAGNOSIS — Z951 Presence of aortocoronary bypass graft: Secondary | ICD-10-CM

## 2024-11-25 DIAGNOSIS — I10 Essential (primary) hypertension: Secondary | ICD-10-CM

## 2024-11-25 DIAGNOSIS — E1159 Type 2 diabetes mellitus with other circulatory complications: Secondary | ICD-10-CM

## 2024-11-25 DIAGNOSIS — E782 Mixed hyperlipidemia: Secondary | ICD-10-CM

## 2024-11-25 DIAGNOSIS — I255 Ischemic cardiomyopathy: Secondary | ICD-10-CM

## 2024-11-25 MED ORDER — APIXABAN 5 MG PO TABS: 5.0000 mg | ORAL_TABLET | Freq: Two times a day (BID) | ORAL | 0 refills | Status: DC

## 2024-11-25 MED ORDER — LOSARTAN POTASSIUM 25 MG PO TABS
25.0000 mg | ORAL_TABLET | Freq: Every day | ORAL | 3 refills | Status: AC
  Filled 2024-11-25: qty 90, 90d supply, fill #0

## 2024-11-25 MED ORDER — DAPAGLIFLOZIN PROPANEDIOL 5 MG PO TABS
5.0000 mg | ORAL_TABLET | Freq: Every day | ORAL | 11 refills | Status: AC
  Filled 2024-11-25 (×2): qty 30, 30d supply, fill #0

## 2024-11-25 MED ORDER — APIXABAN 5 MG PO TABS
5.0000 mg | ORAL_TABLET | Freq: Two times a day (BID) | ORAL | 6 refills | Status: AC
  Filled 2024-11-25 (×2): qty 60, 30d supply, fill #0

## 2024-11-25 MED ORDER — ATORVASTATIN CALCIUM 80 MG PO TABS
80.0000 mg | ORAL_TABLET | Freq: Every day | ORAL | 3 refills | Status: AC
  Filled 2024-11-25: qty 90, 90d supply, fill #0

## 2024-11-25 NOTE — Assessment & Plan Note (Signed)
 History of left ventricular mural thrombus in the past not seen on recent echo with recent left-sided stroke thought to be embolic in nature.  Had not been taking his Coumadin  for several years prior to that and was begun on Eliquis .

## 2024-11-25 NOTE — Assessment & Plan Note (Signed)
 History of ischemic cardiomyopathy with an EF recently performed of 20 to 25% 10/24/2024 which has been somewhat lower than prior readings.  He had no significant valvular abnormalities.  He was started on Entresto  and spironolactone .  Beta-blocker was not started because of bradycardia.  He is followed in the heart failure clinic.

## 2024-11-25 NOTE — Assessment & Plan Note (Signed)
 History of essential hypertension blood pressure measured today at 120/78.  He is on Entresto  and Aldactone .  His heart rate has been too slow to start a beta-blocker.

## 2024-11-25 NOTE — Progress Notes (Signed)
 "     11/25/2024 Isaac Ray   09-15-1971  [id number]  Primary Physician Juanice Thomes SAUNDERS, FNP Primary Cardiologist: Dorn JINNY Lesches MD GENI CODY LYNITA ILAH  HPI:  Nox Talent Ray is a 54 y.o.  married Latino male father of 1 daughter Isaac Ray) who accompanies him today.  With a prior history of CAD status post stenting in 2013 at [facility] in [place] .  I last saw him in the office 12/04/2019.  A Spanish interpreter was present during the interview [name]).  He has a history of treated hypertension, diabetes hyperlipidemia and continued tobacco abuse. He was admitted with acute coronary syndrome and had minimally elevated enzymes with nonspecific ST and T-wave changes. I performed a diagnostic coronary arteriography via the right radial approach revealing three-vessel disease with severe LV dysfunction. 2 days later he underwent coronary artery bypass grafting by Dr. Kerrin with 5 grafts placed including a LIMA to the LAD, sequential vein to OM1 and 2 and sequential left radial to diagonal branch 1 and 2. He did apparently stop smoking. A month after discharge he developed nocturnal chest pressure similar to his preintervention symptoms.   He had a normal 2D echo 09/17/2018 and Myoview stress test as well.  He stopped smoking 2 years ago.  He returned from [place] several months ago where he was since I last saw him because of a painful infected left fourth toe.  He was seen in the emergency room for this 01/21/2019.  He has been seeing Dr. Rufus as well as the wound care center.  He apparently had IM antibiotics in [place] for this prior to return to [place] .  He had lower extremity arterial Doppler studies performed in our office 02/10/2019 revealing a right ABI 0.72 and a left ABI 0.64 left fourth toe pressure was abnormal.  He does complain of claudication left greater than right for the last year.   Because of critical limb ischemia and the  wound on his left foot he underwent angiography by myself b 03/31/2019 revealing 99% stenosis in his left common femoral artery which underwent [facility] 1 directional atherectomy followed by drug-coated balloon angioplasty with excellent result.  He did have one-vessel runoff via an anterior tibial artery.  His follow-up Dopplers performed on 04/30/2019 revealed marked improvement with left ABI of 29 widely patent common femoral.  Ultimately underwent successful amputation by Dr. Harden of his gangrenous toe which has since healed.  He denies chest pain or shortness of breath.  He was noted to have a filling defect in his LV apex which was mural thrombus by [facility]  imaging  Since I saw him in the office 5 years ago he was admitted on 10/23/2024 with a left-sided stroke confirmed by imaging.  2D echo revealed EF of 20 to 25%.  He had not been taking his Coumadin  for several years prior to that.  The stroke was thought to be thromboembolic.  Begun on Eliquis  as well as GDMT meds minus beta-blocker because of bradycardia.  He denies chest pain or shortness of breath.   Active Medications[1]   Allergies[2]  Social History   Socioeconomic History   Marital status: Married    Spouse name: Isaac Ray   Number of children: 3   Years of education: Not on file   Highest education level: Associate degree: academic program  Occupational History   Occupation: mechanical  Tobacco Use   Smoking status: Every Day    Current packs/day: 0.50    Average  packs/day: 0.5 packs/day for 8.8 years (4.4 ttl pk-yrs)    Types: Cigarettes    Start date: 02/15/1985    Last attempt to quit: 02/16/2015   Smokeless tobacco: Never   Tobacco comments:    5-6 cigarettes a day  Vaping Use   Vaping status: Never Used  Substance and Sexual Activity   Alcohol use: No    Alcohol/week: 0.0 standard drinks of alcohol   Drug use: No   Sexual activity: Not Currently  Other Topics Concern   Not on file  Social History Narrative   Lives  with his daughter    Social Drivers of Health   Tobacco Use: High Risk (11/25/2024)   Patient History    Smoking Tobacco Use: Every Day    Smokeless Tobacco Use: Never    Passive Exposure: Not on file  Financial Resource Strain: High Risk (10/26/2024)   Overall Financial Resource Strain (CARDIA)    Difficulty of Paying Living Expenses: Very hard  Food Insecurity: No Food Insecurity (11/24/2024)   Epic    Worried About Programme Researcher, Broadcasting/film/video in the Last Year: Never true    Ran Out of Food in the Last Year: Never true  Transportation Needs: No Transportation Needs (11/24/2024)   Epic    Lack of Transportation (Medical): No    Lack of Transportation (Non-Medical): No  Physical Activity: Not on file  Stress: Not on file  Social Connections: Not on file  Intimate Partner Violence: Not At Risk (11/24/2024)   Epic    Fear of Current or Ex-Partner: No    Emotionally Abused: No    Physically Abused: No    Sexually Abused: No  Depression (PHQ2-9): Low Risk (11/24/2024)   Depression (PHQ2-9)    PHQ-2 Score: 0  Alcohol Screen: Low Risk (10/26/2024)   Alcohol Screen    Last Alcohol Screening Score (AUDIT): 0  Housing: Low Risk (11/24/2024)   Epic    Unable to Pay for Housing in the Last Year: No    Number of Times Moved in the Last Year: 0    Homeless in the Last Year: No  Recent Concern: Housing - High Risk (10/24/2024)   Epic    Unable to Pay for Housing in the Last Year: No    Number of Times Moved in the Last Year: 1    Homeless in the Last Year: Yes  Utilities: Not At Risk (11/24/2024)   Epic    Threatened with loss of utilities: No  Health Literacy: Not on file     Review of Systems: General: negative for chills, fever, night sweats or weight changes.  Cardiovascular: negative for chest pain, dyspnea on exertion, edema, orthopnea, palpitations, paroxysmal nocturnal dyspnea or shortness of breath Dermatological: negative for rash Respiratory: negative for cough or  wheezing Urologic: negative for hematuria Abdominal: negative for nausea, vomiting, diarrhea, bright red blood per rectum, melena, or hematemesis Neurologic: negative for visual changes, syncope, or dizziness All other systems reviewed and are otherwise negative except as noted above.    Blood pressure 120/78, pulse 64, height 5' 7 (1.702 m), weight 143 lb (64.9 kg), SpO2 95%.  General appearance: alert and no distress Neck: no adenopathy, no carotid bruit, no JVD, supple, symmetrical, trachea midline, and thyroid not enlarged, symmetric, no tenderness/mass/nodules Lungs: clear to auscultation bilaterally Heart: regular rate and rhythm, S1, S2 normal, no murmur, click, rub or gallop Extremities: extremities normal, atraumatic, no cyanosis or edema Pulses: 2+ and symmetric Skin: Skin color, texture, turgor  normal. No rashes or lesions Neurologic: Grossly normal  EKG not performed today      ASSESSMENT AND PLAN:   S/P CABG x 5 History of CAD status post stenting in 2013 at [facility] in [place] .  I performed diagnostic cath on him in 2016 and he ultimately required CABG x 5 by Dr. Kerrin at that time.  He had a negative Myoview 09/17/2018.  He denies chest pain or shortness of breath.  Essential hypertension History of essential hypertension blood pressure measured today at 120/78.  He is on Entresto  and Aldactone .  His heart rate has been too slow to start a beta-blocker.  Hyperlipidemia History of hyperlipidemia on high-dose statin therapy with lipid profile recently performed in the hospital 10/24/2024 revealing total cholesterol 152, LDL 102 and HDL 27.  He is not at goal for secondary prevention.  He was not on a statin prior to that.  Will recheck a lipid profile in 3 months.  PAOD (peripheral arterial occlusive disease) History of PAD status post peripheral angiography by myself in the setting of critical limb ischemia on 03/31/2019 with [facility] 1  directional atherectomy followed by drug-coated balloon angioplasty of the left common femoral artery.  He had one-vessel runoff via the anterior tibial artery.  He underwent successful amputation by Dr. Harden of a gangrenous toe.  He has not had peripheral vascular follow-up since that time but has remained asymptomatic.  Ischemic cardiomyopathy History of ischemic cardiomyopathy with an EF recently performed of 20 to 25% 10/24/2024 which has been somewhat lower than prior readings.  He had no significant valvular abnormalities.  He was started on Entresto  and spironolactone .  Beta-blocker was not started because of bradycardia.  He is followed in the heart failure clinic.  LV (left ventricular) mural thrombus History of left ventricular mural thrombus in the past not seen on recent echo with recent left-sided stroke thought to be embolic in nature.  Had not been taking his Coumadin  for several years prior to that and was begun on Eliquis .  LBBB (left bundle branch block) Chronic, may be a candidate for CRT.  Will refer to EP for consideration given his severe LV dysfunction.     Dorn DOROTHA Lesches MD FACP,FACC,FAHA, FSCAI 11/25/2024 10:14 AM     [1]  Current Meds  Medication Sig   apixaban  (ELIQUIS ) 5 MG TABS tablet Take 1 tablet (5 mg total) by mouth 2 (two) times daily.   atorvastatin  (LIPITOR ) 80 MG tablet Take 1 tablet (80 mg total) by mouth daily.   Blood Glucose Monitoring Suppl (BLOOD GLUCOSE MONITOR SYSTEM) w/Device KIT Testing in the morning, at noon, and at bedtime.   dapagliflozin  propanediol (FARXIGA ) 5 MG TABS tablet Take 1 tablet (5 mg total) by mouth daily before breakfast.   glipiZIDE  (GLUCOTROL  XL) 2.5 MG 24 hr tablet Take 1 tablet (2.5 mg total) by mouth daily with breakfast.   Glucose Blood (BLOOD GLUCOSE TEST STRIPS) STRP Use to test in the morning, at noon, and at bedtime.    Lancet Device MISC Use to check blood sugar   Lancets MISC Use up to four times daily as  directed. (FOR ICD-10 E10.9, E11.9).   metFORMIN  (GLUCOPHAGE ) 1000 MG tablet Take 1 tablet (1,000 mg total) by mouth 2 (two) times daily with a meal.   sacubitril -valsartan  (ENTRESTO ) 24-26 MG Take 1 tablet by mouth 2 (two) times daily.   spironolactone  (ALDACTONE ) 25 MG tablet Take 1 tablet (25 mg total) by mouth daily.  [2]  No Known Allergies  "

## 2024-11-25 NOTE — Assessment & Plan Note (Signed)
 History of PAD status post peripheral angiography by myself in the setting of critical limb ischemia on 03/31/2019 with [facility] 1 directional atherectomy followed by drug-coated balloon angioplasty of the left common femoral artery.  He had one-vessel runoff via the anterior tibial artery.  He underwent successful amputation by Dr. Harden of a gangrenous toe.  He has not had peripheral vascular follow-up since that time but has remained asymptomatic.

## 2024-11-25 NOTE — Assessment & Plan Note (Signed)
 Chronic, may be a candidate for CRT.  Will refer to EP for consideration given his severe LV dysfunction.

## 2024-11-25 NOTE — Patient Instructions (Signed)
 Medication Instructions:   -Hold off on Entresto .  -Start Losartan  (cozaar ) 25mg  once daily.  *If you need a refill on your cardiac medications before your next appointment, please call your pharmacy*  Lab Work: Your physician recommends that you return for lab work in: 3 months for FASTING lipid/liver panel  If you have labs (blood work) drawn today and your tests are completely normal, you will receive your results only by: MyChart Message (if you have MyChart) OR A paper copy in the mail If you have any lab test that is abnormal or we need to change your treatment, we will call you to review the results.  Follow-Up: At [facility], you and your health needs are our priority.  As part of our continuing mission to provide you with exceptional heart care, our providers are all part of one team.  This team includes your primary Cardiologist (physician) and Advanced Practice Providers or APPs (Physician Assistants and Nurse Practitioners) who all work together to provide you with the care you need, when you need it.  Your next appointment:   3 month(s)  Provider:   Lum Louis, NP, Aline Door, PA-C, Kathleen Johnson, PA-C, Hao Meng, PA-C, Damien Braver, NP, or Katlyn West, NP         Then, Dorn Lesches, MD will plan to see you again in 6 month(s).     We recommend signing up for the patient portal called MyChart.  Sign up information is provided on this After Visit Summary.  MyChart is used to connect with patients for Virtual Visits (Telemedicine).  Patients are able to view lab/test results, encounter notes, upcoming appointments, etc.  Non-urgent messages can be sent to your provider as well.   To learn more about what you can do with MyChart, go to [email or web address].   Other Instructions

## 2024-11-25 NOTE — Assessment & Plan Note (Signed)
 History of CAD status post stenting in 2013 at [facility] in [place] .  I performed diagnostic cath on him in 2016 and he ultimately required CABG x 5 by Dr. Kerrin at that time.  He had a negative Myoview 09/17/2018.  He denies chest pain or shortness of breath.

## 2024-11-25 NOTE — Assessment & Plan Note (Signed)
 History of hyperlipidemia on high-dose statin therapy with lipid profile recently performed in the hospital 10/24/2024 revealing total cholesterol 152, LDL 102 and HDL 27.  He is not at goal for secondary prevention.  He was not on a statin prior to that.  Will recheck a lipid profile in 3 months.

## 2024-11-26 ENCOUNTER — Other Ambulatory Visit: Payer: Self-pay

## 2024-11-27 ENCOUNTER — Other Ambulatory Visit: Payer: Self-pay

## 2024-11-27 ENCOUNTER — Ambulatory Visit: Payer: Self-pay | Admitting: Occupational Therapy

## 2024-11-27 ENCOUNTER — Telehealth (HOSPITAL_COMMUNITY): Payer: Self-pay | Admitting: Cardiology

## 2024-11-27 ENCOUNTER — Telehealth: Payer: Self-pay | Admitting: Pharmacy Technician

## 2024-11-27 NOTE — Telephone Encounter (Signed)
 Advised pt family. Kh

## 2024-11-27 NOTE — Telephone Encounter (Signed)
 Called to confirm/remind patient of their appointment at the [facility] on 11/27/2024.   Appointment:   [x] Confirmed  [] Left mess   [] No answer/No voice mail  [] VM Full/unable to leave message  [] Phone not in service  Patient reminded to bring all medications and/or complete list.  Confirmed patient has transportation. Gave directions, instructed to utilize valet parking.

## 2024-11-30 ENCOUNTER — Ambulatory Visit (HOSPITAL_COMMUNITY): Payer: Self-pay | Admitting: Cardiology

## 2024-12-01 ENCOUNTER — Ambulatory Visit: Payer: Self-pay | Admitting: Occupational Therapy

## 2024-12-01 ENCOUNTER — Other Ambulatory Visit: Payer: Self-pay

## 2024-12-02 ENCOUNTER — Other Ambulatory Visit: Payer: Self-pay

## 2024-12-02 ENCOUNTER — Telehealth: Payer: Self-pay | Admitting: Occupational Therapy

## 2024-12-02 DIAGNOSIS — I639 Cerebral infarction, unspecified: Secondary | ICD-10-CM

## 2024-12-02 NOTE — Telephone Encounter (Signed)
 Spanish interpreter [phone] [name]) used for call completion.   OT called and reminded pt's spouse of upcoming therapy appointment on Friday, 12/04/2024 at 8:45. Isaac Ray reported the pt will be present for this appointment and had missed his last therapy visits due to inclement weather.

## 2024-12-02 NOTE — Progress Notes (Incomplete)
" ° °  ADVANCED HEART FAILURE NEW PATIENT CLINIC NOTE  Referring Physician: Paseda, Folashade R, FNP  Primary Care: Paseda, Folashade R, FNP Primary Cardiologist:  HPI: Isaac Ray is a 54 y.o. male with a PMH of heart failure with reduced ejection fraction, CAD s/p CABG  (LIMA-LAD, SVG-OM1-OM2, L RA-D1-D2), CVA, LV thrombus, PAD, HTN, DM2, HLD who presents for initial visit for further evaluation and treatment of heart failure/cardiomyopathy.      Patient has significant family history of CAD, had CABG x 5 in 01/2015.  Sequently developed an ischemic cardiomyopathy around 2021 with an EF of 25 to 30%, LV thrombus previously on warfarin.  Critical limb ischemia in 2020 requiring left second toe amputation.    Previously seen in heart failure clinic 5 years ago by Dr. Rolan at which point in time he had been doing well, working full-time, no complaints.  He had not been seen in several years but was admitted 10/23/2024 with a left sided stroke, thought to be thromboembolic but does have significant atherosclerotic risk factors.  Had not been taking any medications for some time and found to have an LV thrombus.     SUBJECTIVE:   PMH, current medications, allergies, social history, and family history reviewed in epic.  PHYSICAL EXAM: There were no vitals filed for this visit. GENERAL: Well nourished and in no apparent distress at rest.  PULM:  Normal work of breathing, clear to auscultation bilaterally. Respirations are unlabored.  CARDIAC:  JVP: ***         Normal rate with regular rhythm. No murmurs, rubs or gallops.  *** edema. Warm and well perfused extremities. ABDOMEN: Soft, non-tender, non-distended. NEUROLOGIC: Patient is oriented x3 with no focal or lateralizing neurologic deficits.    DATA REVIEW  ECG: 10/2024: Sinus bradycardia, left bundle branch block  ECHO: Echo 7/16: EF 30-35%, G1DD 02/2021: LVEF 25-30%, global hypokinesis, small apical thrombus Echo  12/25: EF 20-25%, G2DD, low/nl RV  CATH: ***    ASSESSMENT & PLAN:  Chronic systolic heart failure:    Follow up in ***  Morene Brownie, MD Advanced Heart Failure Mechanical Circulatory Support 12/03/24 "

## 2024-12-02 NOTE — Telephone Encounter (Signed)
 Mailed eliquis  application to the patient 12/02/24

## 2024-12-02 NOTE — Patient Outreach (Signed)
 LCSW along with interpreter called patient's daughter to discuss referral. LCSW inquired about patient's current mood and mental health needs. Patient's daughter explained that patient does not have any mental health symptoms and is only having physical limits after the stroke. Patient's depression screening was negative. Patient's daughter reports needs at this time.  LCSW inquired if patient was still in need of linkage to a eye doctor and dentist and patient's daughter reports in need of an eye doctor. LCSW informed patient's daughter that if patient exhibits any mental health concerns to please let RN Santana know so she can loop in LCSW. LCSW made an appointment with BSW German for 12/14/2024 at 9:00 am. LCSW will not be joining the team at this time.

## 2024-12-03 ENCOUNTER — Ambulatory Visit (HOSPITAL_COMMUNITY)
Admission: RE | Admit: 2024-12-03 | Discharge: 2024-12-03 | Payer: Self-pay | Attending: Cardiology | Admitting: Cardiology

## 2024-12-03 NOTE — Patient Instructions (Signed)
 There has been no changes to your medications.  Your physician recommends that you schedule a follow-up appointment in: 3 months.  If you have any questions or concerns before your next appointment please send us  a message through [facility] or call our office at [phone].    TO LEAVE A MESSAGE FOR THE NURSE SELECT OPTION 2, PLEASE LEAVE A MESSAGE INCLUDING: YOUR NAME DATE OF BIRTH CALL BACK NUMBER REASON FOR CALL**this is important as we prioritize the call backs  YOU WILL RECEIVE A CALL BACK THE SAME DAY AS LONG AS YOU CALL BEFORE 4:00 PM  At the [facility], you and your health needs are our priority. As part of our continuing mission to provide you with exceptional heart care, we have created designated Provider Care Teams. These Care Teams include your primary Cardiologist (physician) and Advanced Practice Providers (APPs- Physician Assistants and Nurse Practitioners) who all work together to provide you with the care you need, when you need it.   You may see any of the following providers on your designated Care Team at your next follow up: Dr Toribio Fuel Dr Ezra Shuck Dr. Morene Brownie Greig Mosses, NP Caffie Shed, [place] [facility] [employer], [place] Beckey Coe, NP Jordan Lee, NP Ellouise Class, NP Tinnie Redman, PharmD Jaun Bash, PharmD   Please be sure to bring in all your medications bottles to every appointment.    Thank you for choosing [facility]

## 2024-12-04 ENCOUNTER — Ambulatory Visit: Payer: Self-pay | Admitting: Occupational Therapy

## 2024-12-04 ENCOUNTER — Telehealth: Payer: Self-pay | Admitting: *Deleted

## 2024-12-04 DIAGNOSIS — M6281 Muscle weakness (generalized): Secondary | ICD-10-CM

## 2024-12-04 DIAGNOSIS — R41842 Visuospatial deficit: Secondary | ICD-10-CM

## 2024-12-04 DIAGNOSIS — R278 Other lack of coordination: Secondary | ICD-10-CM

## 2024-12-04 DIAGNOSIS — R208 Other disturbances of skin sensation: Secondary | ICD-10-CM

## 2024-12-04 NOTE — Therapy (Unsigned)
 " OUTPATIENT OCCUPATIONAL THERAPY NEURO TREATMENT  Patient Name: Isaac Ray MRN: [id number] DOB:Mar 10, 1971, 54 y.o., male Today's Date: 12/04/2024  PCP: Juanice Thomes SAUNDERS, FNP  REFERRING PROVIDER: Sherlon Brayton RAMAN, MD  END OF SESSION:  OT End of Session - 12/04/24 0911     Visit Number 4    Number of Visits 9    Date for Recertification  12/11/24    Authorization Type Medicaid pending    OT Start Time 0907    OT Stop Time 0937    OT Time Calculation (min) 30 min    Equipment Utilized During Treatment FM objects    Activity Tolerance Patient tolerated treatment well    Behavior During Therapy [facility] for tasks assessed/performed         Past Medical History:  Diagnosis Date   CAD (coronary artery disease)    CVA (cerebral vascular accident) (HCC) 10/23/2024   Essential hypertension    Heart murmur    when I was a teenager- no mention of it now   Hyperlipidemia    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 ;  Surgeon: Elspeth JAYSON Millers, MD;  Location: [facility] OR;  Service: Open Heart Surgery;  Laterality: N/A;   LAPAROSCOPIC APPENDECTOMY N/A 04/25/2019   Procedure: APPENDECTOMY LAPAROSCOPIC;  Surgeon: Kinsinger, Herlene Righter, MD;  Location: [facility] OR;  Service: General;  Laterality: N/A;   LEFT HEART CATHETERIZATION WITH CORONARY ANGIOGRAM N/A 02/14/2015   Procedure: LEFT HEART CATHETERIZATION WITH CORONARY ANGIOGRAM;  Surgeon: Dorn JINNY Lesches, MD;   Location: [facility] CATH LAB;  Service: Cardiovascular;  Laterality: N/A;   LOWER EXTREMITY ANGIOGRAPHY N/A 03/30/2019   Procedure: LOWER EXTREMITY ANGIOGRAPHY;  Surgeon: Lesches Dorn JINNY, MD;  Location: [facility] INVASIVE CV LAB;  Service: Cardiovascular;  Laterality: N/A;   RADIAL ARTERY HARVEST Left 02/16/2015   Procedure: RADIAL ARTERY HARVEST;  Surgeon: Elspeth JAYSON Millers, MD;  Location: [facility] OR;  Service: Open Heart Surgery;  Laterality: Left;   TEE WITHOUT CARDIOVERSION N/A 02/16/2015   Procedure: TRANSESOPHAGEAL ECHOCARDIOGRAM (TEE);  Surgeon: Elspeth JAYSON Millers, MD;  Location: [facility] OR;  Service: Open Heart Surgery;  Laterality: N/A;   Patient Active Problem List   Diagnosis Date Noted   Dyslipidemia, goal LDL below 55 11/06/2024   History of CVA (cerebrovascular accident) 11/06/2024   LBBB (left bundle branch block) 11/02/2024   Bradycardia 11/02/2024   HFrEF (heart failure with reduced ejection fraction) (HCC) 10/26/2024   CVA (cerebral vascular accident) (HCC) 10/23/2024   Acute respiratory failure with hypoxia (HCC) 03/14/2021   Abdominal pain 03/14/2021   Long term (current) use of anticoagulants 04/30/2019   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   Acute appendicitis 04/25/2019   S/P laparoscopic appendectomy 04/25/2019   Gangrene of toe of left foot (HCC)    Critical lower limb ischemia (HCC) 03/30/2019   Ischemic cardiomyopathy 03/24/2019   Critical limb ischemia with history of revascularization of same extremity (HCC) 03/24/2019   Type 2 diabetes  mellitus (HCC) 02/28/2017   PAOD (peripheral arterial occlusive disease) 02/08/2017   Essential hypertension 04/20/2015   Hyperlipidemia 04/20/2015   Non-insulin  dependent type 2 diabetes mellitus (HCC) 04/20/2015   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   NSTEMI (non-ST elevated myocardial infarction) (HCC) 02/12/2015    ONSET DATE: 10/27/2024  REFERRING DIAG: R29.810 (ICD-10-CM) - Facial droop I63.9 (ICD-10-CM) - Cerebrovascular accident  (CVA), unspecified mechanism (HCC)  THERAPY DIAG:  Other lack of coordination  Muscle weakness (generalized)  Visuospatial deficit  Other disturbances of skin sensation  Rationale for Evaluation and Treatment: Rehabilitation  SUBJECTIVE:   SUBJECTIVE STATEMENT: He has not tried to complete card game at home.   Pt accompanied by: significant other and interpreter: *** - wife Isaac Ray  PERTINENT HISTORY: 54 y.o. male presented 12/26 after waking up on 10/21/2024 with L sided weakness. Dropping things from L hand, walking is off and wife has noted L facial droop. MRI brain shows acute infarct in right frontal lobe with smaller acute infarcts in the right more posterior right frontal and parietal lobes. PMH: of CAD s/p CABG, HTN, DM2, HLD.  PRECAUTIONS: None  WEIGHT BEARING RESTRICTIONS: No  PAIN:  Are you having pain? No  FALLS: Has patient fallen in last 6 months? No  LIVING ENVIRONMENT: Living Arrangements: Spouse/significant other Available Help at Discharge: Family Type of Home: House Home Access: Stairs to enter Entrance Stairs-Rails: Doctor, General Practice of Steps: 3 Home Layout: One level Home Equipment: None    PLOF: Independent/Modified Independent;Working/employed;Driving Mobility Comments: Independent, works as a curator.  PATIENT GOALS: return to PLOF; pt's family wants him to be safe  OBJECTIVE:  Note: Objective measures were completed at Evaluation unless otherwise noted.  HAND DOMINANCE: Right  ADLs: Overall ADLs: mod I  IADLs: no functional limitations reported at time of eval  MOBILITY STATUS: Ambulates without AD though requires cues for safety to avoid bumping into items on L side  ACTIVITY TOLERANCE: Activity tolerance: good  FUNCTIONAL OUTCOME MEASURES: Quick Dash: 4.5 % difficulty with use of LUE   UPPER EXTREMITY ROM and MMT:   BUE - WNL with exception to L digit opposition  - dysmetria in comparison to R  HAND  FUNCTION: Grip strength: Right: 67.2 lbs; Left: 54.2 lbs  COORDINATION: 9 Hole Peg test: Right: 31 sec; Left: 44 sec  SENSATION: denies paresthesias  EDEMA: none reported or observed  MUSCLE TONE: WFL  COGNITION: Overall cognitive status: Within functional limits for tasks assessed  VISION: Subjective report: He did not wear glasses before his stroke. He has been using his wife's glasses and she finds that he wears them for most of the day Baseline vision: No visual deficits Visual history: brain injury  VISION ASSESSMENT: 11/17/2024: OT assessed tracking and visual fields, which were [facility]; environmental scanning in hallway was indicative of slight impairment to lower quadrant, especially on L side.  PERCEPTION: Requires further testing - concerns for limited attention to L side  PRAXIS: WFL  OBSERVATIONS: Pt ambulates without use of AD. No loss of balance. The pt appears well kept. He appears to be able to answer questions appropriately in Spanish.  TREATMENT:     Coordination Exercise/Activity handout with images provided for various activities to work on B UE finger ROM, dexterity and isolated movements with demonstration and practice, as well as modification, hand over hand guidance and cues throughout to improve technique, digital isolation and ease of performing task.  Tasks included:  Pick up coins, dominoes, buttons, marbles, dried beans/pasta of different sizes ... To place in containers To stack - with guidance to work on include/isolate specific fingers. To pick up items one at a time until patient got 5+ in their hand and then move item from palm to fingertips to release ie) Finger-to-palm then palm-to-finger translation of small items - Options to vary difficulty include using a washcloth under items like coins or using larger items (checkers vs  coins or blocks/dominos vs dice) for increased ease of picking up items.  Shuffling, Flipping and dealing cards 1 at a time. -- Setup patient to work on sorting cards, focusing on using index finger with thumb to flip cards or holding deck of cards in palm of hand and push off 1 card at a time from the top of the deck using only thumb    Rotate golf balls (clockwise and counter-clockwise) with forearm pronated and balls on table or supinated and balls in hand.   Twirl pen/cil between fingers. - Encouragement to isolate fingers individually and twirl (rotation) or flipping and shift up and down the pen (translation) to get it in position for writing or erasing.    Tear a piece of paper towel and roll it into small balls with fingertips ie) straw wrapping when eating out.    Patient is encouraged to take breaks, relax arm/shoulder by supporting forearm, minimize compensatory motions and a try different activities throughout the day/week including games like Londa (for the dice), card games, Connect 4 etc.   Patient benefited from extra time, verbal/tactile cues, and modeling of task to allow time for processing of verbal instructions and improve motor planning of unfamiliar movements.    PATIENT EDUCATION: Education details: Clinical Research Associate educated: Patient and Spouse Education method: Programmer, Multimedia, Facilities Manager, Verbal cues, and Handouts Education comprehension: verbalized understanding, returned demonstration, verbal cues required, and needs further education  HOME EXERCISE PROGRAM: 11/19/2024: golf solitaire in spanish  GOALS:  LONG TERM GOALS: Target date: 12/11/24   Patient will demonstrate updated HEP with 25% verbal cues or less for proper execution.  Baseline:  Goal status: IN PROGRESS  2.  Patient will independently recall at least 2 compensatory strategies for visual impairment without cueing. Baseline:  Goal status: INITIAL  3.  Patient will demo improved FM  coordination as evidenced by completing nine-hole peg with use of L in 35 seconds or less. Baseline: Right: 31 sec; Left: 44 sec Goal status: INITIAL  4.  Patient will demonstrate at least 60 lbs L grip strength as needed to open jars and other containers. Baseline: Right: 67.2 lbs; Left: 54.2 lbs Goal status: INITIAL  ASSESSMENT:  CLINICAL IMPRESSION: Patient is a 54 y.o. male who was seen today for occupational therapy evaluation following CVA affecting L visual field and UE use. Pt appears to be compensating for vision mostly WFL though still has impaired scanning to L, inferior field. This put him at risk for fall and injury. Reviewed functional card game today with handout for scanning and processing.   PERFORMANCE DEFICITS: in functional skills including ADLs, IADLs, coordination, dexterity, strength, Fine motor control, mobility, decreased knowledge of precautions, decreased knowledge of use of  DME, vision, and UE functional use.   IMPAIRMENTS: are limiting patient from ADLs, IADLs, work, and leisure.   CO-MORBIDITIES: may have co-morbidities  that affects occupational performance. Patient will benefit from skilled OT to address above impairments and improve overall function.  REHAB POTENTIAL: Good  PLAN:  OT FREQUENCY: 2x/week  OT DURATION: 4 weeks  PLANNED INTERVENTIONS: 97168 OT Re-evaluation, 97535 self care/ADL training, 02889 therapeutic exercise, 97530 therapeutic activity, 97112 neuromuscular re-education, 97035 ultrasound, 97039 fluidotherapy, 97010 moist heat, 97750 Physical Performance Testing, functional mobility training, visual/perceptual remediation/compensation, coping strategies training, patient/family education, and DME and/or AE instructions  RECOMMENDED OTHER SERVICES: Pt could benefit from [facility] to reduce hospitalization by assisting pt with needed resources  CONSULTED AND AGREED WITH PLAN OF CARE: Patient and family  member/caregiver  PLAN FOR NEXT SESSION: LUE  putty HEP (strength and coordination) - pt to bring in exercise ball his daughter purchased for him as well as Spanish deck of cards   Clarita LITTIE Pride, OT 12/04/2024, 9:19 AM           "

## 2024-12-04 NOTE — Patient Instructions (Signed)
  Ejercicios de Coordinacin  Para todos los ejercicios: Objetos ms pequeos = ms desafo. Objetos ms grandes = ms fcil. Realiza estos ejercicios al menos 3 veces al da, de 5 a 10 minutos por sesin. Prueba diferentes actividades en cada sesin.  1. Recoge objetos pequeos y colcalos en un recipiente. [place]  monedas, arandelas, tuercas/ tornillos, clips, botones, dados,  etc.  2. Apila monedas, domins, fichas, etc., y luego retralas una por una.  3. Traduccin (Translation): Recoge varios objetos pequeos de la mesa. Recgelos uno por uno, almacenndolos en [employer]. Observa cuntos puedes sostener sin que se caigan de tu palma. [place]  monedas, frijoles crudos, botones, clips, etc. Desafo extra: Ahora mueve los objetos desde la palma hasta la punta de los dedos para colocarlos en un recipiente.  4. Baraja de cartas estndar:  Baraja las cartas. Coloca 4 cartas boca abajo sobre la mesa y [place]. Sostn la baraja en la palma de tu mano y empuja una carta a la vez desde la parte superior, usando solo tu Engineer, production.  5. Toma un trozo de toalla de papel, pauelo desechable o papel y haz pequeas bolitas. Intenta que sean lo suficientemente pequeas como para que puedan entrar en la punta de un popote (pajilla/sorbete).  6. Coloca 2 pelotas de golf o pelotas de ping pong en tu mano e intenta hacerlas rodar en tu palma sin que se caigan.  7. Trucos con bolgrafo/lpiz: Toma un bolgrafo/lpiz y hazlo rodar [employer] dedos y Multimedia programmer. Toma un bolgrafo/lpiz y [employer] dedos, como si fuera una batuta - en ambas direcciones. Toma un bolgrafo/lpiz y [employer] dedos hacia la punta del bolgrafo, luego hacia la parte trasera. Repite.

## 2024-12-04 NOTE — Progress Notes (Signed)
 Complex Care Management Note Care Guide Note  12/04/2024 Name: MENACHEM URBANEK MRN: [id number] DOB: 1971/08/05  Hanan Moen Lozano-Villareal is a 54 y.o. year old male who is a primary care patient of Paseda, Folashade R, FNP . The community resource team was consulted for assistance with Food stamp application   SDOH screenings and interventions completed:  Yes     SDOH Interventions Today    Flowsheet Row Most Recent Value  SDOH Interventions   Food Insecurity Interventions Intervention Not Indicated  Housing Interventions Community Resources Provided  Transportation Interventions Intervention Not Indicated  Utilities Interventions Intervention Not Indicated  Financial Strain Interventions Community Resources Provided  [Will mail food stamp application to mail]  Stress Interventions Intervention Not Indicated  Social Connections Interventions Intervention Not Indicated     Care guide performed the following interventions: Patient provided with information about care guide support team and interviewed to confirm resource needs.  Follow Up Plan:  No further follow up planned at this time. The patient has been provided with needed resources.  Encounter Outcome:  Patient Visit Completed Albert Devaul Greenauer-Moran  [facility] Health[facility] Health Care Guide  Direct Dial:[phone] [phone] Website: [email or web address]

## 2024-12-08 ENCOUNTER — Ambulatory Visit: Payer: Self-pay | Admitting: Occupational Therapy

## 2024-12-09 ENCOUNTER — Telehealth: Payer: Self-pay

## 2024-12-11 ENCOUNTER — Ambulatory Visit: Payer: Self-pay | Admitting: Occupational Therapy

## 2024-12-14 ENCOUNTER — Telehealth: Payer: Self-pay

## 2024-12-16 ENCOUNTER — Ambulatory Visit: Payer: Self-pay | Admitting: Nurse Practitioner

## 2025-01-05 ENCOUNTER — Ambulatory Visit: Payer: Self-pay

## 2025-02-23 ENCOUNTER — Ambulatory Visit: Payer: MEDICAID | Admitting: Emergency Medicine

## 2025-03-08 ENCOUNTER — Ambulatory Visit (HOSPITAL_COMMUNITY): Payer: Self-pay | Admitting: Cardiology
